# Patient Record
Sex: Female | Born: 1960 | Hispanic: Yes | Marital: Married | Smoking: Never smoker
Health system: Southern US, Community
[De-identification: ages and names within clinical notes are randomized; demographics above are authoritative.]

## PROBLEM LIST (undated history)

## (undated) DIAGNOSIS — I509 Heart failure, unspecified: Secondary | ICD-10-CM

## (undated) DIAGNOSIS — J45909 Unspecified asthma, uncomplicated: Secondary | ICD-10-CM

## (undated) DIAGNOSIS — E785 Hyperlipidemia, unspecified: Secondary | ICD-10-CM

## (undated) DIAGNOSIS — R7303 Prediabetes: Secondary | ICD-10-CM

## (undated) DIAGNOSIS — J189 Pneumonia, unspecified organism: Secondary | ICD-10-CM

## (undated) DIAGNOSIS — R0600 Dyspnea, unspecified: Secondary | ICD-10-CM

## (undated) DIAGNOSIS — G4730 Sleep apnea, unspecified: Secondary | ICD-10-CM

## (undated) HISTORY — PX: NO PAST SURGERIES: SHX2092

## (undated) HISTORY — PX: OTHER SURGICAL HISTORY: SHX169

---

## 2018-11-28 ENCOUNTER — Emergency Department (HOSPITAL_COMMUNITY): Payer: Self-pay

## 2018-11-28 ENCOUNTER — Other Ambulatory Visit: Payer: Self-pay

## 2018-11-28 ENCOUNTER — Encounter (HOSPITAL_COMMUNITY): Payer: Self-pay | Admitting: Pulmonary Disease

## 2018-11-28 ENCOUNTER — Inpatient Hospital Stay (HOSPITAL_COMMUNITY)
Admission: EM | Admit: 2018-11-28 | Discharge: 2018-12-14 | DRG: 207 | Disposition: A | Discharge status: Home Health | Payer: Self-pay | Attending: Internal Medicine | Admitting: Internal Medicine

## 2018-11-28 DIAGNOSIS — J1008 Influenza due to other identified influenza virus with other specified pneumonia: Principal | ICD-10-CM | POA: Diagnosis present

## 2018-11-28 DIAGNOSIS — R0602 Shortness of breath: Secondary | ICD-10-CM

## 2018-11-28 DIAGNOSIS — I5023 Acute on chronic systolic (congestive) heart failure: Secondary | ICD-10-CM | POA: Diagnosis present

## 2018-11-28 DIAGNOSIS — R140 Abdominal distension (gaseous): Secondary | ICD-10-CM

## 2018-11-28 DIAGNOSIS — J9601 Acute respiratory failure with hypoxia: Secondary | ICD-10-CM

## 2018-11-28 DIAGNOSIS — R069 Unspecified abnormalities of breathing: Secondary | ICD-10-CM

## 2018-11-28 DIAGNOSIS — J9690 Respiratory failure, unspecified, unspecified whether with hypoxia or hypercapnia: Secondary | ICD-10-CM

## 2018-11-28 DIAGNOSIS — I509 Heart failure, unspecified: Secondary | ICD-10-CM

## 2018-11-28 DIAGNOSIS — Z9289 Personal history of other medical treatment: Secondary | ICD-10-CM

## 2018-11-28 DIAGNOSIS — G9341 Metabolic encephalopathy: Secondary | ICD-10-CM | POA: Diagnosis present

## 2018-11-28 DIAGNOSIS — Z23 Encounter for immunization: Secondary | ICD-10-CM

## 2018-11-28 DIAGNOSIS — J9602 Acute respiratory failure with hypercapnia: Secondary | ICD-10-CM

## 2018-11-28 DIAGNOSIS — J9600 Acute respiratory failure, unspecified whether with hypoxia or hypercapnia: Secondary | ICD-10-CM

## 2018-11-28 DIAGNOSIS — E876 Hypokalemia: Secondary | ICD-10-CM | POA: Diagnosis not present

## 2018-11-28 DIAGNOSIS — N179 Acute kidney failure, unspecified: Secondary | ICD-10-CM | POA: Diagnosis present

## 2018-11-28 DIAGNOSIS — E870 Hyperosmolality and hypernatremia: Secondary | ICD-10-CM | POA: Diagnosis not present

## 2018-11-28 DIAGNOSIS — J80 Acute respiratory distress syndrome: Secondary | ICD-10-CM | POA: Diagnosis present

## 2018-11-28 DIAGNOSIS — R739 Hyperglycemia, unspecified: Secondary | ICD-10-CM | POA: Diagnosis present

## 2018-11-28 DIAGNOSIS — Z9911 Dependence on respirator [ventilator] status: Secondary | ICD-10-CM

## 2018-11-28 DIAGNOSIS — J129 Viral pneumonia, unspecified: Secondary | ICD-10-CM | POA: Diagnosis present

## 2018-11-28 DIAGNOSIS — E662 Morbid (severe) obesity with alveolar hypoventilation: Secondary | ICD-10-CM | POA: Diagnosis present

## 2018-11-28 DIAGNOSIS — Z01818 Encounter for other preprocedural examination: Secondary | ICD-10-CM

## 2018-11-28 DIAGNOSIS — J810 Acute pulmonary edema: Secondary | ICD-10-CM

## 2018-11-28 DIAGNOSIS — I272 Pulmonary hypertension, unspecified: Secondary | ICD-10-CM | POA: Diagnosis present

## 2018-11-28 DIAGNOSIS — K5909 Other constipation: Secondary | ICD-10-CM | POA: Diagnosis present

## 2018-11-28 DIAGNOSIS — J9811 Atelectasis: Secondary | ICD-10-CM | POA: Diagnosis not present

## 2018-11-28 LAB — RESPIRATORY PANEL BY PCR
Adenovirus: NOT DETECTED
Bordetella pertussis: NOT DETECTED
Chlamydophila pneumoniae: NOT DETECTED
Coronavirus 229E: NOT DETECTED
Coronavirus HKU1: NOT DETECTED
Coronavirus NL63: NOT DETECTED
Coronavirus OC43: NOT DETECTED
Influenza A: NOT DETECTED
Influenza B: DETECTED — AB
METAPNEUMOVIRUS-RVPPCR: NOT DETECTED
Mycoplasma pneumoniae: NOT DETECTED
PARAINFLUENZA VIRUS 1-RVPPCR: NOT DETECTED
PARAINFLUENZA VIRUS 2-RVPPCR: NOT DETECTED
Parainfluenza Virus 3: NOT DETECTED
Parainfluenza Virus 4: NOT DETECTED
Respiratory Syncytial Virus: NOT DETECTED
Rhinovirus / Enterovirus: NOT DETECTED

## 2018-11-28 LAB — HEPATIC FUNCTION PANEL
ALBUMIN: 3.2 g/dL — AB (ref 3.5–5.0)
ALT: 29 U/L (ref 0–44)
AST: 45 U/L — ABNORMAL HIGH (ref 15–41)
Alkaline Phosphatase: 100 U/L (ref 38–126)
Bilirubin, Direct: 0.1 mg/dL (ref 0.0–0.2)
Indirect Bilirubin: 0.1 mg/dL — ABNORMAL LOW (ref 0.3–0.9)
Total Bilirubin: 0.2 mg/dL — ABNORMAL LOW (ref 0.3–1.2)
Total Protein: 7.7 g/dL (ref 6.5–8.1)

## 2018-11-28 LAB — CBC
HCT: 48.5 % — ABNORMAL HIGH (ref 36.0–46.0)
HEMOGLOBIN: 14.5 g/dL (ref 12.0–15.0)
MCH: 29.4 pg (ref 26.0–34.0)
MCHC: 29.9 g/dL — ABNORMAL LOW (ref 30.0–36.0)
MCV: 98.2 fL (ref 80.0–100.0)
Platelets: 173 10*3/uL (ref 150–400)
RBC: 4.94 MIL/uL (ref 3.87–5.11)
RDW: 14.4 % (ref 11.5–15.5)
WBC: 8.2 10*3/uL (ref 4.0–10.5)
nRBC: 0.6 % — ABNORMAL HIGH (ref 0.0–0.2)

## 2018-11-28 LAB — CREATININE, SERUM
Creatinine, Ser: 0.66 mg/dL (ref 0.44–1.00)
GFR calc Af Amer: >60 mL/min (ref >60)
GFR calc non Af Amer: >60 mL/min (ref >60)

## 2018-11-28 LAB — BASIC METABOLIC PANEL
Anion gap: 8 (ref 5–15)
BUN: 9 mg/dL (ref 6–20)
CHLORIDE: 97 mmol/L — AB (ref 98–111)
CO2: 32 mmol/L (ref 22–32)
Calcium: 8.6 mg/dL — ABNORMAL LOW (ref 8.9–10.3)
Creatinine, Ser: 0.69 mg/dL (ref 0.44–1.00)
GFR calc Af Amer: >60 mL/min (ref >60)
GFR calc non Af Amer: >60 mL/min (ref >60)
Glucose, Bld: 132 mg/dL — ABNORMAL HIGH (ref 70–99)
Potassium: 4.5 mmol/L (ref 3.5–5.1)
Sodium: 137 mmol/L (ref 135–145)

## 2018-11-28 LAB — POCT I-STAT 7, (LYTES, BLD GAS, ICA,H+H)
Acid-Base Excess: 6 mmol/L — ABNORMAL HIGH (ref 0.0–2.0)
Bicarbonate: 37.4 mmol/L — ABNORMAL HIGH (ref 20.0–28.0)
Calcium, Ion: 1.18 mmol/L (ref 1.15–1.40)
HCT: 43 % (ref 36.0–46.0)
Hemoglobin: 14.6 g/dL (ref 12.0–15.0)
O2 Saturation: 90 %
PH ART: 7.213 — AB (ref 7.350–7.450)
Potassium: 4.4 mmol/L (ref 3.5–5.1)
SODIUM: 139 mmol/L (ref 135–145)
TCO2: 40 mmol/L — ABNORMAL HIGH (ref 22–32)
pCO2 arterial: 92.7 mmHg (ref 32.0–48.0)
pO2, Arterial: 76 mmHg — ABNORMAL LOW (ref 83.0–108.0)

## 2018-11-28 LAB — GLUCOSE, CAPILLARY
Glucose-Capillary: 116 mg/dL — ABNORMAL HIGH (ref 70–99)
Glucose-Capillary: 115 mg/dL — ABNORMAL HIGH (ref 70–99)

## 2018-11-28 LAB — PROCALCITONIN: Procalcitonin: <0.1 ng/mL

## 2018-11-28 LAB — MAGNESIUM: Magnesium: 1.8 mg/dL (ref 1.7–2.4)

## 2018-11-28 LAB — INFLUENZA PANEL BY PCR (TYPE A & B)
Influenza A By PCR: NEGATIVE
Influenza B By PCR: POSITIVE — AB

## 2018-11-28 LAB — PHOSPHORUS: Phosphorus: 3.4 mg/dL (ref 2.5–4.6)

## 2018-11-28 LAB — BRAIN NATRIURETIC PEPTIDE: B Natriuretic Peptide: 585.3 pg/mL — ABNORMAL HIGH (ref 0.0–100.0)

## 2018-11-28 LAB — MRSA PCR SCREENING: MRSA by PCR: NEGATIVE

## 2018-11-28 LAB — LACTIC ACID, PLASMA
Lactic Acid, Venous: 1.1 mmol/L (ref 0.5–1.9)
Lactic Acid, Venous: 1.2 mmol/L (ref 0.5–1.9)

## 2018-11-28 MED ORDER — FENTANYL CITRATE (PF) 100 MCG/2ML IJ SOLN
50.0000 ug | Freq: Once | INTRAMUSCULAR | Status: AC
  Administered 2018-11-28: 50 ug via INTRAVENOUS
  Filled 2018-11-28: qty 2

## 2018-11-28 MED ORDER — INSULIN ASPART 100 UNIT/ML ~~LOC~~ SOLN
0.0000 [IU] | SUBCUTANEOUS | Status: DC
  Administered 2018-11-29 – 2018-12-02 (×16): 1 [IU] via SUBCUTANEOUS
  Administered 2018-12-02: 2 [IU] via SUBCUTANEOUS
  Administered 2018-12-02: 1 [IU] via SUBCUTANEOUS
  Administered 2018-12-02: 2 [IU] via SUBCUTANEOUS
  Administered 2018-12-03 (×4): 1 [IU] via SUBCUTANEOUS
  Administered 2018-12-03: 2 [IU] via SUBCUTANEOUS
  Administered 2018-12-03 – 2018-12-04 (×3): 1 [IU] via SUBCUTANEOUS
  Administered 2018-12-04: 2 [IU] via SUBCUTANEOUS
  Administered 2018-12-04: 1 [IU] via SUBCUTANEOUS
  Administered 2018-12-04: 2 [IU] via SUBCUTANEOUS
  Administered 2018-12-04: 3 [IU] via SUBCUTANEOUS
  Administered 2018-12-04 – 2018-12-05 (×2): 1 [IU] via SUBCUTANEOUS
  Administered 2018-12-05 (×2): 2 [IU] via SUBCUTANEOUS
  Administered 2018-12-05: 1 [IU] via SUBCUTANEOUS
  Administered 2018-12-05 – 2018-12-07 (×10): 2 [IU] via SUBCUTANEOUS
  Administered 2018-12-07: 1 [IU] via SUBCUTANEOUS
  Administered 2018-12-07 – 2018-12-08 (×5): 2 [IU] via SUBCUTANEOUS
  Administered 2018-12-08: 3 [IU] via SUBCUTANEOUS
  Administered 2018-12-08 – 2018-12-09 (×3): 2 [IU] via SUBCUTANEOUS
  Administered 2018-12-09: 1 [IU] via SUBCUTANEOUS
  Administered 2018-12-09 (×4): 2 [IU] via SUBCUTANEOUS
  Administered 2018-12-10: 1 [IU] via SUBCUTANEOUS
  Administered 2018-12-10: 2 [IU] via SUBCUTANEOUS
  Administered 2018-12-10: 1 [IU] via SUBCUTANEOUS
  Administered 2018-12-10 (×2): 2 [IU] via SUBCUTANEOUS
  Administered 2018-12-10: 1 [IU] via SUBCUTANEOUS
  Administered 2018-12-11: 2 [IU] via SUBCUTANEOUS
  Administered 2018-12-11 – 2018-12-12 (×4): 1 [IU] via SUBCUTANEOUS
  Administered 2018-12-12: 2 [IU] via SUBCUTANEOUS
  Administered 2018-12-12: 1 [IU] via SUBCUTANEOUS

## 2018-11-28 MED ORDER — CHLORHEXIDINE GLUCONATE 0.12% ORAL RINSE (MEDLINE KIT)
15.0000 mL | Freq: Two times a day (BID) | OROMUCOSAL | Status: DC
  Administered 2018-11-28 – 2018-12-10 (×24): 15 mL via OROMUCOSAL

## 2018-11-28 MED ORDER — SODIUM CHLORIDE 0.9 % IV SOLN
1.0000 g | INTRAVENOUS | Status: AC
  Administered 2018-11-28 – 2018-12-04 (×7): 1 g via INTRAVENOUS
  Filled 2018-11-28 (×7): qty 10

## 2018-11-28 MED ORDER — HEPARIN SODIUM (PORCINE) 5000 UNIT/ML IJ SOLN
5000.0000 [IU] | Freq: Three times a day (TID) | INTRAMUSCULAR | Status: DC
  Administered 2018-11-28 – 2018-12-14 (×46): 5000 [IU] via SUBCUTANEOUS
  Filled 2018-11-28 (×46): qty 1

## 2018-11-28 MED ORDER — SODIUM CHLORIDE 0.9 % IV SOLN
500.0000 mg | INTRAVENOUS | Status: DC
  Administered 2018-11-28: 500 mg via INTRAVENOUS
  Filled 2018-11-28: qty 500

## 2018-11-28 MED ORDER — FENTANYL 2500MCG IN NS 250ML (10MCG/ML) PREMIX INFUSION
25.0000 ug/h | INTRAVENOUS | Status: DC
  Administered 2018-11-28: 50 ug/h via INTRAVENOUS
  Administered 2018-11-29 – 2018-12-01 (×3): 100 ug/h via INTRAVENOUS
  Administered 2018-12-04: 150 ug/h via INTRAVENOUS
  Administered 2018-12-04: 75 ug/h via INTRAVENOUS
  Administered 2018-12-05: 350 ug/h via INTRAVENOUS
  Filled 2018-11-28 (×8): qty 250

## 2018-11-28 MED ORDER — PANTOPRAZOLE SODIUM 40 MG IV SOLR
40.0000 mg | Freq: Every day | INTRAVENOUS | Status: DC
  Administered 2018-11-28: 40 mg via INTRAVENOUS
  Filled 2018-11-28: qty 40

## 2018-11-28 MED ORDER — SODIUM CHLORIDE 0.9% FLUSH: 3.0000 mL | Freq: Once | INTRAVENOUS | Status: DC

## 2018-11-28 MED ORDER — MIDAZOLAM HCL 2 MG/2ML IJ SOLN
2.0000 mg | INTRAMUSCULAR | Status: AC | PRN
  Administered 2018-11-28 (×3): 2 mg via INTRAVENOUS
  Filled 2018-11-28 (×3): qty 2

## 2018-11-28 MED ORDER — ETOMIDATE 2 MG/ML IV SOLN
30.0000 mg | Freq: Once | INTRAVENOUS | Status: AC
  Administered 2018-11-28: 30 mg via INTRAVENOUS

## 2018-11-28 MED ORDER — SODIUM CHLORIDE 0.9 % IV SOLN
INTRAVENOUS | Status: DC
  Administered 2018-11-28 – 2018-12-08 (×4): via INTRAVENOUS

## 2018-11-28 MED ORDER — FENTANYL BOLUS VIA INFUSION
50.0000 ug | INTRAVENOUS | Status: DC | PRN
  Administered 2018-11-28 – 2018-12-03 (×3): 50 ug via INTRAVENOUS
  Administered 2018-12-04: 25 ug via INTRAVENOUS
  Administered 2018-12-05: 50 ug via INTRAVENOUS
  Filled 2018-11-28: qty 50

## 2018-11-28 MED ORDER — INFLUENZA VAC SPLIT QUAD 0.5 ML IM SUSY
0.5000 mL | PREFILLED_SYRINGE | INTRAMUSCULAR | Status: AC | PRN
  Administered 2018-12-14: 0.5 mL via INTRAMUSCULAR
  Filled 2018-11-28: qty 0.5

## 2018-11-28 MED ORDER — IPRATROPIUM-ALBUTEROL 0.5-2.5 (3) MG/3ML IN SOLN
3.0000 mL | Freq: Four times a day (QID) | RESPIRATORY_TRACT | Status: DC
  Administered 2018-11-28 – 2018-12-12 (×55): 3 mL via RESPIRATORY_TRACT
  Filled 2018-11-28 (×9): qty 3
  Filled 2018-11-28: qty 6
  Filled 2018-11-28 (×23): qty 3
  Filled 2018-11-28: qty 6
  Filled 2018-11-28 (×21): qty 3

## 2018-11-28 MED ORDER — FUROSEMIDE 10 MG/ML IJ SOLN
40.0000 mg | Freq: Once | INTRAMUSCULAR | Status: AC
  Administered 2018-11-28: 40 mg via INTRAVENOUS
  Filled 2018-11-28: qty 4

## 2018-11-28 MED ORDER — SUCCINYLCHOLINE CHLORIDE 20 MG/ML IJ SOLN
200.0000 mg | Freq: Once | INTRAMUSCULAR | Status: AC
  Administered 2018-11-28: 200 mg via INTRAVENOUS
  Filled 2018-11-28: qty 10

## 2018-11-28 MED ORDER — MIDAZOLAM HCL 2 MG/2ML IJ SOLN
2.0000 mg | INTRAMUSCULAR | Status: DC | PRN
  Administered 2018-12-05 – 2018-12-10 (×8): 2 mg via INTRAVENOUS
  Filled 2018-11-28 (×8): qty 2

## 2018-11-28 MED ORDER — ORAL CARE MOUTH RINSE
15.0000 mL | OROMUCOSAL | Status: DC
  Administered 2018-11-28 – 2018-12-10 (×110): 15 mL via OROMUCOSAL

## 2018-11-28 NOTE — Progress Notes (Signed)
   11/28/18 1600  Clinical Encounter Type  Visited With Patient and family together  Visit Type ED  Referral From Nurse  Patient is non-responsive and intubated. Spoke with patient's daughter along with security guard who spoke spanish. Daughter called other family members to come to hospital because her mother was going to be in one of the ICU units.

## 2018-11-28 NOTE — Plan of Care (Signed)

## 2018-11-28 NOTE — ED Provider Notes (Addendum)
[facility] EMERGENCY DEPARTMENT Provider Note   CSN: [id number] Arrival date & time: 11/28/18  1253    History   Chief Complaint Chief Complaint  Patient presents with  . Shortness of Breath    HPI Veronica Castaneda is a 57 y.o. female.     HPI   58 yo F with no known PMHx here with SOB.  History obtained with Spanish interpreter.  Per patient's report, she has had progressively worsening shortness of breath, cough, and weakness throughout the week.  Her symptoms started as a cough, worse with lying flat and with exertion initially.  Over the last several days, this is progressed to worsening shortness of breath.  She has had severe dyspnea with any amount of exertion.  She has been tired due to her work of breathing.  She denies any known fevers.  She has not had any sputum production.  She has noticed increasing lower extremity edema as well as distention of his abdomen as well.  Denies any chest pain.  Denies known history of coronary disease and has not had a stent placed. History somewhat limited secondary to increased work of breathing and fatigue.  History reviewed. No pertinent past medical history.  Patient Active Problem List   Diagnosis Date Noted  . Acute respiratory failure (HCC) 11/28/2018    History reviewed. No pertinent surgical history.   OB History   No obstetric history on file.      Home Medications    Prior to Admission medications   Medication Sig Start Date End Date Taking? Authorizing Provider  acetaminophen (TYLENOL) 325 MG tablet Take 650 mg by mouth every 6 (six) hours as needed for mild pain or fever.   Yes [provider]  Phenyleph-Doxylamine-DM-APAP (NYQUIL SEVERE COLD/FLU) 5-6.25-10-325 MG/15ML LIQD Take 1 Dose by mouth as needed (cough and cold).   Yes [provider]  Pseudoeph-Doxylamine-DM-APAP (NYQUIL PO) Take 2 tablets by mouth as needed (cough and cold).   Yes [provider]     Family History History reviewed. No pertinent family history.  Social History Social History   Tobacco Use  . Smoking status: Not on file  Substance Use Topics  . Alcohol use: Not on file  . Drug use: Not on file     Allergies   Patient has no allergy information on record.   Review of Systems Review of Systems  Constitutional: Positive for fatigue. Negative for chills and fever.  HENT: Negative for congestion and rhinorrhea.   Eyes: Negative for visual disturbance.  Respiratory: Positive for shortness of breath. Negative for cough and wheezing.   Cardiovascular: Positive for leg swelling. Negative for chest pain.  Gastrointestinal: Negative for abdominal pain, diarrhea, nausea and vomiting.  Genitourinary: Negative for dysuria and flank pain.  Musculoskeletal: Negative for neck pain and neck stiffness.  Skin: Negative for rash and wound.  Allergic/Immunologic: Negative for immunocompromised state.  Neurological: Positive for weakness. Negative for syncope and headaches.  All other systems reviewed and are negative.    Physical Exam Updated Vital Signs BP 113/80   Pulse 82   Resp 14   Ht 4\' 10"  (1.473 m)   Wt 122.5 kg   SpO2 97%   BMI 56.43 kg/m   Physical Exam Vitals signs and nursing note reviewed.  Constitutional:      General: She is in acute distress.     Appearance: She is well-developed. She is ill-appearing.  HENT:     Head: Normocephalic and  atraumatic.  Eyes:     Conjunctiva/sclera: Conjunctivae normal.  Neck:     Musculoskeletal: Neck supple.  Cardiovascular:     Rate and Rhythm: Normal rate and regular rhythm.     Heart sounds: Normal heart sounds. No murmur. No friction rub.  Pulmonary:     Effort: Pulmonary effort is normal. Tachypnea present. No respiratory distress.     Breath sounds: Examination of the right-middle field reveals rales. Examination of the left-middle field reveals rales. Examination of the right-lower field reveals  rales. Examination of the left-lower field reveals rales. Rales present.  Abdominal:     General: There is no distension.     Palpations: Abdomen is soft.     Tenderness: There is no abdominal tenderness.  Musculoskeletal:     Right lower leg: Edema present.     Left lower leg: Edema present.  Skin:    General: Skin is warm.     Capillary Refill: Capillary refill takes less than 2 seconds.  Neurological:     Mental Status: She is alert and oriented to person, place, and time.     Motor: No abnormal muscle tone.      ED Treatments / Results  Labs (all labs ordered are listed, but only abnormal results are displayed) Labs Reviewed  BASIC METABOLIC PANEL - Abnormal; Notable for the following components:      Result Value   Chloride 97 (*)    Glucose, Bld 132 (*)    Calcium 8.6 (*)    All other components within normal limits  CBC - Abnormal; Notable for the following components:   HCT 48.5 (*)    MCHC 29.9 (*)    nRBC 0.6 (*)    All other components within normal limits  BRAIN NATRIURETIC PEPTIDE - Abnormal; Notable for the following components:   B Natriuretic Peptide 585.3 (*)    All other components within normal limits  HEPATIC FUNCTION PANEL - Abnormal; Notable for the following components:   Albumin 3.2 (*)    AST 45 (*)    Total Bilirubin 0.2 (*)    Indirect Bilirubin 0.1 (*)    All other components within normal limits  POCT I-STAT 7, (LYTES, BLD GAS, ICA,H+H) - Abnormal; Notable for the following components:   pH, Arterial 7.213 (*)    pCO2 arterial 92.7 (*)    pO2, Arterial 76.0 (*)    Bicarbonate 37.4 (*)    TCO2 40 (*)    Acid-Base Excess 6.0 (*)    All other components within normal limits  RESPIRATORY PANEL BY PCR  CULTURE, RESPIRATORY  URINE CULTURE  INFLUENZA PANEL BY PCR (TYPE A & B)  RAPID URINE DRUG SCREEN, HOSP PERFORMED  PROCALCITONIN  HEMOGLOBIN A1C  CBC  CREATININE, SERUM  MAGNESIUM  PHOSPHORUS  LACTIC ACID, PLASMA  LACTIC ACID, PLASMA   LEGIONELLA PNEUMOPHILA SEROGP 1 UR AG  STREP PNEUMONIAE URINARY ANTIGEN  BLOOD GAS, ARTERIAL  HIV ANTIBODY (ROUTINE TESTING W REFLEX)  I-STAT TROPONIN, ED  I-STAT BETA HCG BLOOD, ED ([facility], [facility], [facility] ONLY)    EKG EKG Interpretation  Date/Time:  Friday November 28 2018 13:16:18 EST Ventricular Rate:  100 PR Interval:  134 QRS Duration: 80 QT Interval:  328 QTC Calculation: 423 R Axis:   98 Text Interpretation:  Sinus rhythm with frequent Premature ventricular complexes Biatrial enlargement Rightward axis Low voltage QRS Abnormal ECG No old tracing to compare Confirmed by Shaune Pollack [phone]) on 11/28/2018 4:20:43 PM   Radiology Dg Chest  Portable 1 View  Result Date: 11/28/2018 CLINICAL DATA:  Shortness of breath.  Intubation. EXAM: PORTABLE CHEST 1 VIEW COMPARISON:  One-view chest x-ray 11/28/2018 at 2:26 p.m. FINDINGS: Patient has been intubated. The endotracheal tube terminates near the carina and could be pulled back 2-3 cm for more optimal positioning. Heart is enlarged. Interstitial edema is again noted without significant change. Bibasilar airspace disease likely reflects atelectasis. IMPRESSION: 1. Endotracheal tube terminates just above the carina and could be pulled back 2-3 cm for more optimal positioning. 2. Stable findings of congestive heart failure. Electronically Signed   By: Marin Roberts M.D.   On: 11/28/2018 16:19   Dg Chest Port 1 View  Result Date: 11/28/2018 CLINICAL DATA:  Shortness of breath for the past 3 days. Nonsmoker. EXAM: PORTABLE CHEST 1 VIEW COMPARISON:  None. FINDINGS: Enlarged cardiac silhouette. Prominent pulmonary vasculature and interstitial markings. No visible Kerley lines or pleural fluid. Mild peribronchial thickening. Thoracic spine degenerative changes. IMPRESSION: Cardiomegaly and mild changes of congestive heart failure. Electronically Signed   By: Beckie Salts M.D.   On: 11/28/2018 15:06    Procedures .Critical Care Performed by: Shaune Pollack, MD Authorized by: Shaune Pollack, MD   Critical care provider statement:    Critical care time (minutes):  75   Critical care time was exclusive of:  Separately billable procedures and treating other patients and teaching time   Critical care was necessary to treat or prevent imminent or life-threatening deterioration of the following conditions:  Circulatory failure, cardiac failure and respiratory failure   Critical care was time spent personally by me on the following activities:  Development of treatment plan with patient or surrogate, discussions with consultants, evaluation of patient's response to treatment, examination of patient, obtaining history from patient or surrogate, ordering and performing treatments and interventions, ordering and review of laboratory studies, ordering and review of radiographic studies, pulse oximetry, re-evaluation of patient's condition and review of old charts   I assumed direction of critical care for this patient from another provider in my specialty: no   Procedure Name: Intubation Date/Time: 11/28/2018 4:53 PM Performed by: Shaune Pollack, MD Pre-anesthesia Checklist: Patient identified, Patient being monitored, Emergency Drugs available, Timeout performed and Suction available Oxygen Delivery Method: Non-rebreather mask Preoxygenation: Pre-oxygenation with 100% oxygen Induction Type: Rapid sequence Ventilation: Mask ventilation without difficulty Laryngoscope Size: Glidescope and 3 Grade View: Grade I Tube size: 7.5 mm Number of attempts: 1 Airway Equipment and Method: Rigid stylet and Patient positioned with wedge pillow Placement Confirmation: ETT inserted through vocal cords under direct vision,  CO2 detector and Breath sounds checked- equal and bilateral Secured at: 23 cm Tube secured with: ETT holder Dental Injury: Teeth and Oropharynx as per pre-operative assessment  Difficulty Due To: Difficulty was anticipated, Difficult Airway-  due to large tongue and Difficult Airway- due to reduced neck mobility      (including critical care time)  Medications Ordered in ED Medications  fentaNYL in NS (33mcg/ml) infusion-PREMIX (50 mcg/hr Intravenous New Bag/Given 11/28/18 1535)  fentaNYL (SUBLIMAZE) bolus via infusion 50 mcg (50 mcg Intravenous Bolus from Bag 11/28/18 1537)  midazolam (VERSED) injection 2 mg (2 mg Intravenous Given 11/28/18 1635)  midazolam (VERSED) injection 2 mg (has no administration in time range)  insulin aspart (novoLOG) injection 0-9 Units (has no administration in time range)  heparin injection 5,000 Units (has no administration in time range)  pantoprazole (PROTONIX) injection 40 mg (has no administration in time range)  0.9 %  sodium chloride infusion (has no administration in time range)  azithromycin (ZITHROMAX) 500 mg in sodium chloride 0.9 % 250 mL IVPB (has no administration in time range)  cefTRIAXone (ROCEPHIN) 1 g in sodium chloride 0.9 % 100 mL IVPB (has no administration in time range)  furosemide (LASIX) injection 40 mg (40 mg Intravenous Given 11/28/18 1616)  succinylcholine (ANECTINE) injection 200 mg (200 mg Intravenous Given 11/28/18 1526)  etomidate (AMIDATE) injection 30 mg (30 mg Intravenous Given 11/28/18 1526)  fentaNYL (SUBLIMAZE) injection 50 mcg (50 mcg Intravenous Given 11/28/18 1543)     Initial Impression / Assessment and Plan / ED Course  I have reviewed the triage vital signs and the nursing notes.  Pertinent labs & imaging results that were available during my care of the patient were reviewed by me and considered in my medical decision making (see chart for details).        58 year old morbidly obese female here with shortness of breath.  I suspect patient has multifactorial combined hypoxic and hypercapnic respiratory failure secondary to CHF, likely due to pulmonary hypertension, as well as obesity hypoventilation syndrome.  Patient profoundly hypoxic to the  60s on 6 L on arrival here.  She was briefly placed on a nonrebreather with improvement then placed on BiPAP.  Chest x-ray is consistent with CHF.  BNP is elevated.  Lab work does not suggest acute infection and chest x-ray has no focal consolidation.  She has no unilateral leg swelling to suggest PE.  On BiPAP, patient remained drowsy and demonstrated persistent hypoventilation with low tidal volumes and increasing confusion.  Decision subsequently made to intubate, which patient tolerated well.  Endotracheal tube at carina on repeat chest x-ray, and this was withdrawn 2 cm.  Lasix given.  Initial blood gas shows acute on chronic hypoxic and hypercapnic respiratory failure.  Increased respiratory rate from 14-18 and will also increase her PEEP given her significant obesity hypoventilation and chest wall resistance, to facilitate better oxygenation and ventilation.  Admit to ICU.  Final Clinical Impressions(s) / ED Diagnoses   Final diagnoses:  Acute respiratory failure with hypoxia and hypercapnia (HCC)  Acute on chronic congestive heart failure, unspecified heart failure type [facility])    ED Discharge Orders    None       Shaune Pollack, MD 11/28/18 1656    Shaune Pollack, MD 11/28/18 [phone]

## 2018-11-28 NOTE — ED Notes (Signed)
ED Provider at bedside. 

## 2018-11-28 NOTE — ED Notes (Signed)
ED TO INPATIENT HANDOFF REPORT  ED Nurse Name and Phone #: Jinny Blossom [phone]  S Name/Age/Gender Veronica Castaneda 58 y.o. female Room/Bed: 026C/026C  Code Status   Code Status: Full Code  Home/SNF/Other Home Patient oriented to: self, place, time and situation Is this baseline? Yes   Triage Complete: Triage complete  Chief Complaint SOB; Bilateral Edema; PNA Decreased  Triage Note Patient sent by PCP - diagnosed with pneumonia, had decreased oxygen saturation - 70% RA in triage, increased to 94% with 4L O2 nasal cannula. Patient's family states patient has had cough, fevers/chills, shortness of breath x 3 days. Respirations labored, patient reports some chest tightness d/t dyspnea.   Allergies Not on File  Level of Care/Admitting Diagnosis ED Disposition    ED Disposition Condition [place] [facility]: [place] [[id number]]  Level of Care: ICU [6]  Diagnosis: Acute respiratory failure ([place]) [518.81.ICD-9-CM]  Admitting Physician: Loanne Drilling [facility] [[id number]]  Attending Physician: Margaretha Seeds [[id number]]  Estimated length of stay: past midnight tomorrow  Certification:: I certify this patient will need inpatient services for at least 2 midnights  PT Class (Do Not Modify): Inpatient [101]  PT Acc Code (Do Not Modify): Private [1]       B Medical/Surgery History History reviewed. No pertinent past medical history. History reviewed. No pertinent surgical history.   A IV Location/Drains/Wounds Patient Lines/Drains/Airways Status   Active Line/Drains/Airways    Name:   Placement date:   Placement time:   Site:   Days:   Peripheral IV 11/28/18 Right Antecubital   11/28/18    1525    Antecubital   less than 1   Peripheral IV 11/28/18 Left Hand   11/28/18    1530    Hand   less than 1   NG/OG Tube Orogastric 16 Fr. Left mouth Xray;Aucultation Documented cm marking at nare/ corner of mouth   11/28/18    1615    Left mouth   less  than 1   Airway 7.5 mm   11/28/18    1606     less than 1          Intake/Output Last 24 hours No intake or output data in the 24 hours ending 11/28/18 1714  Labs/Imaging Results for orders placed or performed during the hospital encounter of 11/28/18 (from the past 48 hour(s))  Basic metabolic panel     Status: Abnormal   Collection Time: 11/28/18  1:26 PM  Result Value Ref Range   Sodium 137 135 - 145 mmol/L   Potassium 4.5 3.5 - 5.1 mmol/L   Chloride 97 (L) 98 - 111 mmol/L   CO2 32 22 - 32 mmol/L   Glucose, Bld 132 (H) 70 - 99 mg/dL   BUN 9 6 - 20 mg/dL   Creatinine, Ser 0.69 0.44 - 1.00 mg/dL   Calcium 8.6 (L) 8.9 - 10.3 mg/dL   GFR calc non Af Amer >60 >60 mL/min   GFR calc Af Amer >60 >60 mL/min   Anion gap 8 5 - 15    Comment: Performed at [facility], [place] [address], [place] [address]  CBC     Status: Abnormal   Collection Time: 11/28/18  1:26 PM  Result Value Ref Range   WBC 8.2 4.0 - 10.5 K/uL   RBC 4.94 3.87 - 5.11 MIL/uL   Hemoglobin 14.5 12.0 - 15.0 g/dL   HCT 48.5 (H) 36.0 - 46.0 %  MCV 98.2 80.0 - 100.0 fL   MCH 29.4 26.0 - 34.0 pg   MCHC 29.9 (L) 30.0 - 36.0 g/dL   RDW 14.4 11.5 - 15.5 %   Platelets 173 150 - 400 K/uL   nRBC 0.6 (H) 0.0 - 0.2 %    Comment: Performed at [facility] [address] [place], [place] [address]  Brain natriuretic peptide     Status: Abnormal   Collection Time: 11/28/18  1:26 PM  Result Value Ref Range   B Natriuretic Peptide 585.3 (H) 0.0 - 100.0 pg/mL    Comment: Performed at [facility] [address] [place], [place] [address]  Hepatic function panel     Status: Abnormal   Collection Time: 11/28/18  1:26 PM  Result Value Ref Range   Total Protein 7.7 6.5 - 8.1 g/dL   Albumin 3.2 (L) 3.5 - 5.0 g/dL   AST 45 (H) 15 - 41 U/L   ALT 29 0 - 44 U/L   Alkaline Phosphatase 100 38 - 126 U/L   Total Bilirubin 0.2 (L) 0.3 - 1.2 mg/dL   Bilirubin, Direct 0.1 0.0 - 0.2 mg/dL   Indirect  Bilirubin 0.1 (L) 0.3 - 0.9 mg/dL    Comment: Performed at [facility] [address] [place], [place] [address]  I-STAT 7, (LYTES, BLD GAS, ICA, H+H)     Status: Abnormal   Collection Time: 11/28/18  4:47 PM  Result Value Ref Range   pH, Arterial 7.213 (L) 7.350 - 7.450   pCO2 arterial 92.7 (HH) 32.0 - 48.0 mmHg   pO2, Arterial 76.0 (L) 83.0 - 108.0 mmHg   Bicarbonate 37.4 (H) 20.0 - 28.0 mmol/L   TCO2 40 (H) 22 - 32 mmol/L   O2 Saturation 90.0 %   Acid-Base Excess 6.0 (H) 0.0 - 2.0 mmol/L   Sodium 139 135 - 145 mmol/L   Potassium 4.4 3.5 - 5.1 mmol/L   Calcium, Ion 1.18 1.15 - 1.40 mmol/L   HCT 43.0 36.0 - 46.0 %   Hemoglobin 14.6 12.0 - 15.0 g/dL   Patient temperature HIDE    Collection site RADIAL, ALLEN'S TEST ACCEPTABLE    Sample type ARTERIAL    Comment NOTIFIED PHYSICIAN    Dg Chest Portable 1 View  Result Date: 11/28/2018 CLINICAL DATA:  Shortness of breath.  Intubation. EXAM: PORTABLE CHEST 1 VIEW COMPARISON:  One-view chest x-ray 11/28/2018 at 2:26 p.m. FINDINGS: Patient has been intubated. The endotracheal tube terminates near the carina and could be pulled back 2-3 cm for more optimal positioning. Heart is enlarged. Interstitial edema is again noted without significant change. Bibasilar airspace disease likely reflects atelectasis. IMPRESSION: 1. Endotracheal tube terminates just above the carina and could be pulled back 2-3 cm for more optimal positioning. 2. Stable findings of congestive heart failure. Electronically Signed   By: San Morelle M.D.   On: 11/28/2018 16:19   Dg Chest Port 1 View  Result Date: 11/28/2018 CLINICAL DATA:  Shortness of breath for the past 3 days. Nonsmoker. EXAM: PORTABLE CHEST 1 VIEW COMPARISON:  None. FINDINGS: Enlarged cardiac silhouette. Prominent pulmonary vasculature and interstitial markings. No visible Kerley lines or pleural fluid. Mild peribronchial thickening. Thoracic spine degenerative changes. IMPRESSION: Cardiomegaly  and mild changes of congestive heart failure. Electronically Signed   By: Claudie Revering M.D.   On: 11/28/2018 15:06    Pending Labs [employer] (From admission, onward)    Start     Ordered  11/29/18 0500  CBC  Tomorrow morning,   R     11/28/18 1637   11/29/18 5456  Basic metabolic panel  Tomorrow morning,   R     11/28/18 1637   11/29/18 0500  Blood gas, arterial  Tomorrow morning,   R     11/28/18 1637   11/29/18 0500  Magnesium  Tomorrow morning,   R     11/28/18 1637   11/29/18 0500  Procalcitonin  Daily,   R     11/28/18 1637   11/28/18 1700  HIV Antibody (routine testing w rflx)  Once,   STAT     11/28/18 1700   11/28/18 1636  Magnesium  Once,   R     11/28/18 1637   11/28/18 1636  Phosphorus  Once,   R     11/28/18 1637   11/28/18 1636  Lactic acid, plasma  STAT Now then every 3 hours,   R     11/28/18 1637   11/28/18 1636  Urine culture  Once,   R     11/28/18 1637   11/28/18 1636  Legionella Pneumophila Serogp 1 Ur Ag  Once,   R     11/28/18 1637   11/28/18 1636  Strep pneumoniae urinary antigen  (not at [facility])  Once,   R     11/28/18 1637   11/28/18 1636  Blood gas, arterial  Once,   R     11/28/18 1637   11/28/18 1634  Procalcitonin - Baseline  ONCE - STAT,   STAT     11/28/18 1637   11/28/18 1634  Hemoglobin A1c  Once,   R    Comments:  To assess prior glycemic control    11/28/18 1637   11/28/18 1634  Creatinine, serum  (heparin)  Once,   R    Comments:  Baseline for heparin therapy IF NOT ALREADY DRAWN.    11/28/18 1637   11/28/18 1629  Urine rapid drug screen (hosp performed)  ONCE - STAT,   R     11/28/18 1628   11/28/18 1629  Culture, respiratory (non-expectorated)  Once,   R     11/28/18 1628   11/28/18 1600  Respiratory Panel by PCR  (Respiratory virus panel with precautions)  Once,   R     11/28/18 1559   11/28/18 1535  Influenza panel by PCR (type A & B)  (Influenza PCR Panel)  Once,   R     11/28/18 1534          Vitals/Pain Today's  Vitals   11/28/18 1600 11/28/18 1601 11/28/18 1645 11/28/18 1656  BP: 113/80  115/86   Pulse:  82    Resp:  14 14   SpO2:  97%  99%  Weight:      Height:        Isolation Precautions Droplet precaution  Medications Medications  fentaNYL 2534mg in NS 2538m(1031mml) infusion-PREMIX (75 mcg/hr Intravenous Rate/Dose Change 11/28/18 1707)  fentaNYL (SUBLIMAZE) bolus via infusion 50 mcg (50 mcg Intravenous Bolus from Bag 11/28/18 1537)  midazolam (VERSED) injection 2 mg (has no administration in time range)  insulin aspart (novoLOG) injection 0-9 Units (has no administration in time range)  heparin injection 5,000 Units (has no administration in time range)  pantoprazole (PROTONIX) injection 40 mg (has no administration in time range)  0.9 %  sodium chloride infusion (has no administration in time range)  azithromycin (ZITHROMAX) 500 mg in sodium chloride  0.9 % 250 mL IVPB (has no administration in time range)  cefTRIAXone (ROCEPHIN) 1 g in sodium chloride 0.9 % 100 mL IVPB (has no administration in time range)  furosemide (LASIX) injection 40 mg (40 mg Intravenous Given 11/28/18 1616)  succinylcholine (ANECTINE) injection 200 mg (200 mg Intravenous Given 11/28/18 1526)  etomidate (AMIDATE) injection 30 mg (30 mg Intravenous Given 11/28/18 1526)  fentaNYL (SUBLIMAZE) injection 50 mcg (50 mcg Intravenous Given 11/28/18 1543)  midazolam (VERSED) injection 2 mg (2 mg Intravenous Given 11/28/18 1706)    Mobility walks Low fall risk   Focused Assessments Pulmonary Assessment Handoff:  Lung sounds: Bilateral Breath Sounds: Clear O2 Device: Ventilator O2 Flow Rate (L/min): 10 L/min      R Recommendations: See Admitting Provider Note  Report given to:   Additional Notes: Pt is spanish speaking, needs interpreter. Daughter beside knows some [place], has met with chaplain

## 2018-11-28 NOTE — ED Notes (Signed)
Pt transitioned to 4L of oxygen from non-rebreather. Tolerating well at this time, 97%.

## 2018-11-28 NOTE — Progress Notes (Signed)
ABG results given to Shaune Pollack, MD Orders received to increase RR 18 and PEEP 8.

## 2018-11-28 NOTE — ED Notes (Signed)
Pt placed on Bipap

## 2018-11-28 NOTE — ED Notes (Signed)
Pt 62% on 4L via nasal cannula upon arrival to exam room. Respiratory therapist called, recommended placing pt on nonrebreather until they could be present. Nonrebreather placed on pt, sats increased to 99% at this time.

## 2018-11-28 NOTE — H&P (Signed)
NAME:  Veronica Castaneda, MRN:  [id number], DOB:  1961/08/25, LOS: 0 ADMISSION DATE:  11/28/2018, CONSULTATION DATE:  3/6 REFERRING MD:  isaacs, CHIEF COMPLAINT:  Acute respiratory failure    Brief History   58 year old female admitted 3/6 w/ working dx of CAP +/- pulmonary edema vs ALI w/ resultant hypoxic respiratory failure requiring intubation   History of present illness   58 year old female who initially presented to PCP on 3/6  3d h/o productive cough, fever, chills and worsening shortness of breath. Room air sats at PCP office were 70% so she was sent to ER for evaluation. In triage placed on supplemental oxygen initially required 4 liters for sats to reach 94% but respiratory effort became more labored and sats dropped to 60s on 4 liters. She was placed on BIPAP but still demonstrated worsening WOB so was intubated.  ER work up:BNP 585, wbc 8.2 PCXR R>L interstitial airspace disease; somewhat improved w/ positive pressure   Past Medical History  No sig medical hx on chart Significant Hospital Events   3/6 admitted Consults:  na  Procedures:  ETT 3/6>>>  Significant Diagnostic Tests:  UDS 3/6>>>  Micro Data:  Influenza 3/6>>> Sputum 3/6>>> BCXx2 3/6>>> UC 3/6>>> RVP 3/6>>>  Antimicrobials:   azithro 3/6>>> Rocephin 3/6>>>  Interim history/subjective:    Objective   Blood pressure 113/80, pulse 82, resp. rate 14, height 4\' 10"  (1.473 m), weight 122.5 kg, SpO2 97 %.    Vent Mode: PRVC FiO2 (%):  [50 %-60 %] 60 % Set Rate:  [14 bmp] 14 bmp Vt Set:  [350 mL] 350 mL PEEP:  [5 cmH20] 5 cmH20 Plateau Pressure:  [22 cmH20] 22 cmH20  No intake or output data in the 24 hours ending 11/28/18 1646 Filed Weights   11/28/18 1341  Weight: 122.5 kg   Physical Exam: General: Morbidly obese female, laying in bed, on mechanical ventilation HENT: [employer], AT, ETT in place Eyes: EOMI, no scleral icterus Respiratory: Difficult to auscultate. No wheezing  bilaterally Cardiovascular: Difficult to auscultate. RRR, -M/R/G. Unable to appreciate JVD GI: BS+, soft, nontender Extremities: 1+ pitting edema in lower extremities, warm to touch Neuro: Sedated Skin: Intact, no rashes or bruising Psych: Unable to assess GU: No foley in place  CXR 11/28/18 reviewed independently: Interstitial edema, bibasilar atelectasis  Resolved Hospital Problem list     Assessment & Plan:  Acute hypoxic respiratory failure in setting of bilateral R>L pulmonary infiltrates  -ddx CAP w/ evolving ALI vs CAP w/ element of pulmonary edema (BNP is elevated) pcxr w/ bilateral airspace disease. ETT good position Plan Admit to ICU Sputum culture, RV panel Empiric rocephin and azith Full vent support PAD protocol RASS goal -2 VAP bundle ECHO UDS  SIRS/sepsis 2/2 pna Plan Ensure 48ml/kg fluid challenge Admit to ICU Serial lactic acids  Mild hyperglycemia Plan ssi   Best practice:  Diet: NPO Pain/Anxiety/Delirium protocol (if indicated): 3/6 VAP protocol (if indicated): 3/6 DVT prophylaxis: [place] heparin  GI prophylaxis: ppi Glucose control: ssi if indicated Mobility: BR Code Status: full code  Family Communication: pending  Disposition:   Labs   CBC: Recent Labs  Lab 11/28/18 1326  WBC 8.2  HGB 14.5  HCT 48.5*  MCV 98.2  PLT 173    Basic Metabolic Panel: Recent Labs  Lab 11/28/18 1326  NA 137  K 4.5  CL 97*  CO2 32  GLUCOSE 132*  BUN 9  CREATININE 0.69  CALCIUM 8.6*   GFR: Estimated  Creatinine Clearance: 92.2 mL/min (by C-G formula based on SCr of 0.69 mg/dL). Recent Labs  Lab 11/28/18 1326  WBC 8.2    Liver Function Tests: Recent Labs  Lab 11/28/18 1326  AST 45*  ALT 29  ALKPHOS 100  BILITOT 0.2*  PROT 7.7  ALBUMIN 3.2*   No results for input(s): LIPASE, AMYLASE in the last 168 hours. No results for input(s): AMMONIA in the last 168 hours.  ABG No results found for: PHART, PCO2ART, PO2ART, HCO3, TCO2,  ACIDBASEDEF, O2SAT   Coagulation Profile: No results for input(s): INR, PROTIME in the last 168 hours.  Cardiac Enzymes: No results for input(s): CKTOTAL, CKMB, CKMBINDEX, TROPONINI in the last 168 hours.  HbA1C: No results found for: HGBA1C  CBG: No results for input(s): GLUCAP in the last 168 hours.  Review of Systems:   Unable to obtain due to mental status  Past Medical History  She,  has no past medical history on file.  Unable to obtain due to mental status Surgical History   History reviewed. No pertinent surgical history.  Unable to obtain due to mental status Social History     Unable to obtain due to mental status Family History   Her family history is not on file.  Unable to obtain due to mental status Allergies Not on File   Home Medications  Prior to Admission medications   Not on File     Critical care time: 40 min  The patient is critically ill with multiple organ systems failure and requires high complexity decision making for assessment and support, frequent evaluation and titration of therapies, application of advanced monitoring technologies and extensive interpretation of multiple databases.   Critical Care Time devoted to patient care services described in this note is 41 Minutes. This time reflects time of care of this signee Dr. Mechele Collin. This critical care time does not reflect procedure time, or teaching time or supervisory time of PA/NP/Med student/Med Resident etc but could involve care discussion time.  Mechele Collin, M.D. [facility] Pulmonary/Critical Care Medicine Pager: [phone] After hours pager: [phone]

## 2018-11-28 NOTE — ED Notes (Signed)
Time out performed for intubation.

## 2018-11-28 NOTE — ED Triage Notes (Signed)
Patient sent by PCP - diagnosed with pneumonia, had decreased oxygen saturation - 70% RA in triage, increased to 94% with 4L O2 nasal cannula. Patient's family states patient has had cough, fevers/chills, shortness of breath x 3 days. Respirations labored, patient reports some chest tightness d/t dyspnea.

## 2018-11-29 ENCOUNTER — Inpatient Hospital Stay (HOSPITAL_COMMUNITY): Payer: Self-pay

## 2018-11-29 DIAGNOSIS — R0689 Other abnormalities of breathing: Secondary | ICD-10-CM

## 2018-11-29 DIAGNOSIS — J101 Influenza due to other identified influenza virus with other respiratory manifestations: Secondary | ICD-10-CM

## 2018-11-29 DIAGNOSIS — J9621 Acute and chronic respiratory failure with hypoxia: Secondary | ICD-10-CM

## 2018-11-29 DIAGNOSIS — J9622 Acute and chronic respiratory failure with hypercapnia: Secondary | ICD-10-CM

## 2018-11-29 LAB — POCT I-STAT 7, (LYTES, BLD GAS, ICA,H+H)
Acid-Base Excess: 13 mmol/L — ABNORMAL HIGH (ref 0.0–2.0)
Bicarbonate: 41.6 mmol/L — ABNORMAL HIGH (ref 20.0–28.0)
Calcium, Ion: 1.14 mmol/L — ABNORMAL LOW (ref 1.15–1.40)
HCT: 43 % (ref 36.0–46.0)
Hemoglobin: 14.6 g/dL (ref 12.0–15.0)
O2 Saturation: 90 %
Patient temperature: 97.3
Potassium: 3.4 mmol/L — ABNORMAL LOW (ref 3.5–5.1)
Sodium: 139 mmol/L (ref 135–145)
TCO2: 44 mmol/L — AB (ref 22–32)
pCO2 arterial: 70.6 mmHg (ref 32.0–48.0)
pH, Arterial: 7.375 (ref 7.350–7.450)
pO2, Arterial: 62 mmHg — ABNORMAL LOW (ref 83.0–108.0)

## 2018-11-29 LAB — BASIC METABOLIC PANEL
Anion gap: 12 (ref 5–15)
BUN: 12 mg/dL (ref 6–20)
CALCIUM: 8.2 mg/dL — AB (ref 8.9–10.3)
CO2: 32 mmol/L (ref 22–32)
CREATININE: 0.74 mg/dL (ref 0.44–1.00)
Chloride: 95 mmol/L — ABNORMAL LOW (ref 98–111)
GFR calc Af Amer: >60 mL/min (ref >60)
GFR calc non Af Amer: >60 mL/min (ref >60)
Glucose, Bld: 123 mg/dL — ABNORMAL HIGH (ref 70–99)
Potassium: 3.5 mmol/L (ref 3.5–5.1)
Sodium: 139 mmol/L (ref 135–145)

## 2018-11-29 LAB — CBC
HCT: 45.3 % (ref 36.0–46.0)
Hemoglobin: 13.8 g/dL (ref 12.0–15.0)
MCH: 29.4 pg (ref 26.0–34.0)
MCHC: 30.5 g/dL (ref 30.0–36.0)
MCV: 96.4 fL (ref 80.0–100.0)
Platelets: 155 10*3/uL (ref 150–400)
RBC: 4.7 MIL/uL (ref 3.87–5.11)
RDW: 14.6 % (ref 11.5–15.5)
WBC: 7.3 10*3/uL (ref 4.0–10.5)
nRBC: 0.3 % — ABNORMAL HIGH (ref 0.0–0.2)

## 2018-11-29 LAB — MAGNESIUM
MAGNESIUM: 1.9 mg/dL (ref 1.7–2.4)
Magnesium: 1.7 mg/dL (ref 1.7–2.4)
Magnesium: 1.8 mg/dL (ref 1.7–2.4)

## 2018-11-29 LAB — RAPID URINE DRUG SCREEN, HOSP PERFORMED
Amphetamines: NOT DETECTED
BARBITURATES: NOT DETECTED
Benzodiazepines: POSITIVE — AB
Cocaine: NOT DETECTED
Opiates: NOT DETECTED
Tetrahydrocannabinol: NOT DETECTED

## 2018-11-29 LAB — GLUCOSE, CAPILLARY
Glucose-Capillary: 105 mg/dL — ABNORMAL HIGH (ref 70–99)
Glucose-Capillary: 113 mg/dL — ABNORMAL HIGH (ref 70–99)
Glucose-Capillary: 113 mg/dL — ABNORMAL HIGH (ref 70–99)
Glucose-Capillary: 124 mg/dL — ABNORMAL HIGH (ref 70–99)
Glucose-Capillary: 135 mg/dL — ABNORMAL HIGH (ref 70–99)
Glucose-Capillary: 144 mg/dL — ABNORMAL HIGH (ref 70–99)

## 2018-11-29 LAB — ECHOCARDIOGRAM COMPLETE
Height: 58 in
Weight: 4321.02 oz

## 2018-11-29 LAB — STREP PNEUMONIAE URINARY ANTIGEN: Strep Pneumo Urinary Antigen: NEGATIVE

## 2018-11-29 LAB — PROCALCITONIN: Procalcitonin: <0.1 ng/mL

## 2018-11-29 LAB — HEMOGLOBIN A1C
Hgb A1c MFr Bld: 6.2 % — ABNORMAL HIGH (ref 4.8–5.6)
Mean Plasma Glucose: 131.24 mg/dL

## 2018-11-29 LAB — PHOSPHORUS
Phosphorus: 2.8 mg/dL (ref 2.5–4.6)
Phosphorus: 3.2 mg/dL (ref 2.5–4.6)

## 2018-11-29 LAB — HIV ANTIBODY (ROUTINE TESTING W REFLEX): HIV Screen 4th Generation wRfx: NONREACTIVE

## 2018-11-29 MED ORDER — PERFLUTREN LIPID MICROSPHERE
INTRAVENOUS | Status: AC
  Filled 2018-11-29: qty 10

## 2018-11-29 MED ORDER — VITAL HIGH PROTEIN PO LIQD
1000.0000 mL | ORAL | Status: DC
  Administered 2018-11-29 – 2018-12-05 (×9): 1000 mL

## 2018-11-29 MED ORDER — FUROSEMIDE 10 MG/ML IJ SOLN
40.0000 mg | Freq: Once | INTRAMUSCULAR | Status: AC
  Administered 2018-11-29: 40 mg via INTRAVENOUS
  Filled 2018-11-29: qty 4

## 2018-11-29 MED ORDER — PERFLUTREN LIPID MICROSPHERE
1.0000 mL | INTRAVENOUS | Status: AC | PRN
  Administered 2018-11-29: 6 mL via INTRAVENOUS
  Filled 2018-11-29: qty 10

## 2018-11-29 MED ORDER — PRO-STAT SUGAR FREE PO LIQD
30.0000 mL | Freq: Two times a day (BID) | ORAL | Status: DC
  Administered 2018-11-29 – 2018-12-05 (×13): 30 mL
  Filled 2018-11-29 (×14): qty 30

## 2018-11-29 MED ORDER — ADULT MULTIVITAMIN LIQUID CH
15.0000 mL | Freq: Every day | ORAL | Status: DC
  Administered 2018-11-29 – 2018-12-10 (×12): 15 mL
  Filled 2018-11-29 (×12): qty 15

## 2018-11-29 MED ORDER — PANTOPRAZOLE SODIUM 40 MG PO PACK
40.0000 mg | PACK | ORAL | Status: DC
  Administered 2018-11-29 – 2018-12-10 (×12): 40 mg
  Filled 2018-11-29 (×12): qty 20

## 2018-11-29 MED ORDER — POTASSIUM CHLORIDE 20 MEQ/15ML (10%) PO SOLN
40.0000 meq | Freq: Once | ORAL | Status: AC
  Administered 2018-11-29: 40 meq
  Filled 2018-11-29: qty 30

## 2018-11-29 NOTE — Progress Notes (Signed)
Initial Nutrition Assessment  DOCUMENTATION CODES:   Morbid obesity  INTERVENTION:  Initiate TF with Vital High Protein at goal rate of 40 ml/h (960 ml per day) and Prostat 30 ml BID to provide 1160 kcals, 114 gm protein, 806 ml free water daily.  Provide liquid MVI once daily per tube.   NUTRITION DIAGNOSIS:   Inadequate oral intake related to inability to eat as evidenced by NPO status.  GOAL:   Provide needs based on ASPEN/SCCM guidelines  MONITOR:   TF tolerance, Labs, Skin, Vent status, Weight trends, I & O's  REASON FOR ASSESSMENT:   Ventilator, Consult Enteral/tube feeding initiation and management  ASSESSMENT:   58 yo female presented with of cough, fever, chills, dyspnea.  Found to have hypoxia with interstitial infiltrates requiring intubation. Pt with acute on chronic hypoxic/hypercapnic respiratory failure from Influenza B pneumonia.  Patient is currently intubated on ventilator support MV: 7.6 L/min Temp (24hrs), Avg:98.3 F (36.8 C), Min:97.1 F (36.2 C), Max:98.8 F (37.1 C)  Propofol: none  RD consulted for enteral/tube feeding initiation and management. No family at bedside. Unable to obtain pt's nutrition history. Per RN, pt's vent settings are high, thus will remain intubated. Labs and medications reviewed.   NUTRITION - FOCUSED PHYSICAL EXAM:    Most Recent Value  Orbital Region  No depletion  Upper Arm Region  No depletion  Thoracic and Lumbar Region  No depletion  Buccal Region  No depletion  Temple Region  No depletion  Clavicle Bone Region  No depletion  Clavicle and Acromion Bone Region  No depletion  Scapular Bone Region  Unable to assess  Dorsal Hand  Unable to assess  Patellar Region  No depletion  Anterior Thigh Region  No depletion  Posterior Calf Region  No depletion  Edema (RD Assessment)  Moderate  Hair  Reviewed  Eyes  Unable to assess  Mouth  Unable to assess  Skin  Reviewed  Nails  Unable to assess       Diet  Order:   Diet Order            Diet NPO time specified  Diet effective now              EDUCATION NEEDS:   Not appropriate for education at this time  Skin:  Skin Assessment: Reviewed RN Assessment  Last BM:  3/5  Height:   Ht Readings from Last 1 Encounters:  11/28/18 4\' 10"  (1.473 m)    Weight:   Wt Readings from Last 1 Encounters:  11/29/18 122.5 kg    Ideal Body Weight:  43.76 kg  BMI:  Body mass index is 56.44 kg/m.  Estimated Nutritional Needs:   Kcal:  1100  Protein:  >/= 109 grams  Fluid:  >/= 1.5 L/day    Roslyn Smiling, MS, RD, LDN Pager # [phone] After hours/ weekend pager # [phone]

## 2018-11-29 NOTE — Progress Notes (Signed)
  Echocardiogram 2D Echocardiogram has been performed.  Gerda Diss 11/29/2018, 12:53 PM

## 2018-11-29 NOTE — Progress Notes (Signed)
Fio2 increased to 70% per ABG results.

## 2018-11-29 NOTE — Progress Notes (Signed)
NAME:  Veronica Castaneda, MRN:  [id number], DOB:  1961/09/21, LOS: 1 ADMISSION DATE:  11/28/2018, CONSULTATION DATE:  3/6 REFERRING MD:  isaacs, CHIEF COMPLAINT:  Acute respiratory failure    Brief History   58 yo Spanish speaking female presented with 3 days of cough, fever, chills, dyspnea.  Found to have hypoxia with interstitial infiltrates.  Required intubation.  Past Medical History  No significant medical history  Significant Hospital Events   3/6 admitted  Consults:    Procedures:  ETT 3/6>>>  Significant Diagnostic Tests:  Echo 3/07 >>  Micro Data:  Influenza 3/6>>>Flu B Sputum 3/6>>> BCXx2 3/6>>> UC 3/6>>> RVP 3/6>>>Flu B Pneumococcal Ag 3/07>>>negative Legionella Ag 3/07>>>  Antimicrobials:  azithro 3/6>>>3/7 Rocephin 3/6>>>  Interim history/subjective:  Remains on full vent support  Objective   Blood pressure 118/72, pulse 71, temperature 98.7 F (37.1 C), temperature source Oral, resp. rate 18, height 4\' 10"  (1.473 m), weight 122.5 kg, SpO2 92 %.    Vent Mode: PRVC FiO2 (%):  [50 %-80 %] 70 % Set Rate:  [14 bmp-18 bmp] 18 bmp Vt Set:  [350 mL] 350 mL PEEP:  [5 cmH20-8 cmH20] 8 cmH20 Plateau Pressure:  [18 cmH20-24 cmH20] 18 cmH20   Intake/Output Summary (Last 24 hours) at 11/29/2018 1517 Last data filed at 11/29/2018 0900 Gross per 24 hour  Intake 647.44 ml  Output 850 ml  Net -202.56 ml   Filed Weights   11/28/18 1341 11/28/18 1832 11/29/18 0500  Weight: 122.5 kg 122.5 kg 122.5 kg   Physical Exam:  General - alert Eyes - pupils reactive ENT - ETT in place Cardiac - regular rate/rhythm, no murmur Chest - b/l rhonchi Abdomen - soft, non tender, + bowel sounds Extremities - 1+ edema Skin - no rashes Neuro - moves extremities, RASS -1  CXR (reviewed by me) - b/l interstitial ASD   Resolved Hospital Problem list     Assessment & Plan:   Acute on chronic hypoxic/hypercapnic respiratory failure from Influenza B  pneumonia. Likely also has OSA and OHS. Plan - full vent support - adjust PEEP/FiO2 to keep SpO2 90 to 95% - f/u CXR - day 2 of rocephin - d/c zithromax  - outside time frame for tamiflu - lasix 40 mg IV x one 3/07 - f/u Echo  Hypokalemia. Plan - replace as needed  Acute metabolic encephalopathy. Plan - RASS goal 0 to -1  Best practice:  Diet: tube feeds DVT prophylaxis: [place] heparin  GI prophylaxis: protonix Mobility: bed rest Code Status: full code  Family Communication: updated family at bedside with assistance of Spanish interpreter Disposition: ICU  Labs    CMP Latest Ref Rng & Units 11/29/2018 11/29/2018 11/28/2018  Glucose 70 - 99 mg/dL - 616(W) -  BUN 6 - 20 mg/dL - 12 -  Creatinine 7.37 - 1.00 mg/dL - 1.06 -  Sodium 269 - 145 mmol/L 139 139 139  Potassium 3.5 - 5.1 mmol/L 3.4(L) 3.5 4.4  Chloride 98 - 111 mmol/L - 95(L) -  CO2 22 - 32 mmol/L - 32 -  Calcium 8.9 - 10.3 mg/dL - 8.2(L) -  Total Protein 6.5 - 8.1 g/dL - - -  Total Bilirubin 0.3 - 1.2 mg/dL - - -  Alkaline Phos 38 - 126 U/L - - -  AST 15 - 41 U/L - - -  ALT 0 - 44 U/L - - -   CBC Latest Ref Rng & Units 11/29/2018 11/29/2018 11/28/2018  WBC 4.0 - 10.5  K/uL - 7.3 -  Hemoglobin 12.0 - 15.0 g/dL 43.2 76.1 47.0  Hematocrit 36.0 - 46.0 % 43.0 45.3 43.0  Platelets 150 - 400 K/uL - 155 -   ABG    Component Value Date/Time   PHART 7.375 11/29/2018 0355   PCO2ART 70.6 (HH) 11/29/2018 0355   PO2ART 62.0 (L) 11/29/2018 0355   HCO3 41.6 (H) 11/29/2018 0355   TCO2 44 (H) 11/29/2018 0355   O2SAT 90.0 11/29/2018 0355   CBG (last 3)  Recent Labs    11/29/18 0029 11/29/18 0351 11/29/18 0733  GLUCAP 105* 113* 124*   CC time 31 minutes  Coralyn Helling, MD [facility] Pulmonary/Critical Care 11/29/2018, 9:42 AM

## 2018-11-30 ENCOUNTER — Inpatient Hospital Stay (HOSPITAL_COMMUNITY): Payer: Self-pay

## 2018-11-30 DIAGNOSIS — J9602 Acute respiratory failure with hypercapnia: Secondary | ICD-10-CM

## 2018-11-30 DIAGNOSIS — J9601 Acute respiratory failure with hypoxia: Secondary | ICD-10-CM

## 2018-11-30 LAB — GLUCOSE, CAPILLARY
GLUCOSE-CAPILLARY: 146 mg/dL — AB (ref 70–99)
GLUCOSE-CAPILLARY: 140 mg/dL — AB (ref 70–99)
GLUCOSE-CAPILLARY: 120 mg/dL — AB (ref 70–99)
Glucose-Capillary: 125 mg/dL — ABNORMAL HIGH (ref 70–99)
Glucose-Capillary: 138 mg/dL — ABNORMAL HIGH (ref 70–99)
Glucose-Capillary: 147 mg/dL — ABNORMAL HIGH (ref 70–99)

## 2018-11-30 LAB — BASIC METABOLIC PANEL
Anion gap: 7 (ref 5–15)
BUN: 17 mg/dL (ref 6–20)
CO2: 37 mmol/L — ABNORMAL HIGH (ref 22–32)
Calcium: 8.4 mg/dL — ABNORMAL LOW (ref 8.9–10.3)
Chloride: 97 mmol/L — ABNORMAL LOW (ref 98–111)
Creatinine, Ser: 0.63 mg/dL (ref 0.44–1.00)
GFR calc Af Amer: >60 mL/min (ref >60)
GFR calc non Af Amer: >60 mL/min (ref >60)
Glucose, Bld: 140 mg/dL — ABNORMAL HIGH (ref 70–99)
Potassium: 3.4 mmol/L — ABNORMAL LOW (ref 3.5–5.1)
Sodium: 141 mmol/L (ref 135–145)

## 2018-11-30 LAB — CBC
HEMATOCRIT: 47.4 % — AB (ref 36.0–46.0)
HEMOGLOBIN: 14.1 g/dL (ref 12.0–15.0)
MCH: 28.4 pg (ref 26.0–34.0)
MCHC: 29.7 g/dL — ABNORMAL LOW (ref 30.0–36.0)
MCV: 95.6 fL (ref 80.0–100.0)
Platelets: 159 10*3/uL (ref 150–400)
RBC: 4.96 MIL/uL (ref 3.87–5.11)
RDW: 14.6 % (ref 11.5–15.5)
WBC: 8.7 10*3/uL (ref 4.0–10.5)
nRBC: 0 % (ref 0.0–0.2)

## 2018-11-30 LAB — PHOSPHORUS
Phosphorus: 2.9 mg/dL (ref 2.5–4.6)
Phosphorus: 2.7 mg/dL (ref 2.5–4.6)

## 2018-11-30 LAB — PROCALCITONIN: Procalcitonin: <0.1 ng/mL

## 2018-11-30 LAB — URINE CULTURE: Culture: NO GROWTH

## 2018-11-30 LAB — MAGNESIUM
Magnesium: 1.9 mg/dL (ref 1.7–2.4)
Magnesium: 1.9 mg/dL (ref 1.7–2.4)

## 2018-11-30 MED ORDER — POTASSIUM CHLORIDE 20 MEQ/15ML (10%) PO SOLN
40.0000 meq | Freq: Once | ORAL | Status: AC
  Administered 2018-11-30: 40 meq
  Filled 2018-11-30: qty 30

## 2018-11-30 MED ORDER — FUROSEMIDE 10 MG/ML IJ SOLN
40.0000 mg | Freq: Once | INTRAMUSCULAR | Status: AC
  Administered 2018-11-30: 40 mg via INTRAVENOUS
  Filled 2018-11-30: qty 4

## 2018-11-30 MED ORDER — DOCUSATE SODIUM 50 MG/5ML PO LIQD
100.0000 mg | Freq: Two times a day (BID) | ORAL | Status: DC
  Administered 2018-11-30 – 2018-12-13 (×23): 100 mg
  Filled 2018-11-30 (×23): qty 10

## 2018-11-30 NOTE — Progress Notes (Signed)
NAME:  Veronica Castaneda, MRN:  [id number], DOB:  April 09, 1961, LOS: 2 ADMISSION DATE:  11/28/2018, CONSULTATION DATE:  3/6 REFERRING MD:  isaacs, CHIEF COMPLAINT:  Acute respiratory failure    Brief History   58 yo Spanish speaking female presented with 3 days of cough, fever, chills, dyspnea.  Found to have hypoxia with interstitial infiltrates.  Required intubation.  Past Medical History  No significant medical history  Significant Hospital Events   3/6 admitted  Consults:    Procedures:  ETT 3/6>>>  Significant Diagnostic Tests:  Echo 3/07 >> EF 45 to 50%  Micro Data:  Influenza 3/6>>>Flu B Sputum 3/6>>>oral flora BCXx2 3/6>>> UC 3/6>>>negative RVP 3/6>>>Flu B Pneumococcal Ag 3/07>>>negative Legionella Ag 3/07>>>  Antimicrobials:  Zithromax 3/6>>>3/7 Rocephin 3/6>>>  Interim history/subjective:  Remains on full vent support with increased FiO2 and PEEP.  Objective   Blood pressure 107/73, pulse 79, temperature 99.2 F (37.3 C), temperature source Oral, resp. rate (!) 24, height 4\' 10"  (1.473 m), weight 119.3 kg, SpO2 93 %.    Vent Mode: PRVC FiO2 (%):  [70 %] 70 % Set Rate:  [18 bmp] 18 bmp Vt Set:  [350 mL] 350 mL PEEP:  [8 cmH20] 8 cmH20 Plateau Pressure:  [17 cmH20-25 cmH20] 20 cmH20   Intake/Output Summary (Last 24 hours) at 11/30/2018 1134 Last data filed at 11/30/2018 1000 Gross per 24 hour  Intake 1578.03 ml  Output 2830 ml  Net -1251.97 ml   Filed Weights   11/28/18 1832 11/29/18 0500 11/30/18 0500  Weight: 122.5 kg 122.5 kg 119.3 kg   Physical Exam:  General - alert Eyes - pupils reactive ENT - ETT in place Cardiac - regular rate/rhythm, no murmur Chest - decreased BS, scattered rhonchi Abdomen - soft, non tender, + bowel sounds Extremities - 1+ edema Skin - no rashes Neuro - RASS 0  CXR (reviewed by me) - CM, interstitial edema   Resolved Hospital Problem list     Assessment & Plan:   Acute on chronic hypoxic/hypercapnic  respiratory failure from Influenza B pneumonia. Likely also has OSA and OHS. Plan - full vent support - goal SpO2 90 to 95% - day 3 of rocephin - outside timeframe for tamiflu - f/u CXR  Systolic CHF >> undetermined whether acute or chronic. Plan - lasix 40 mg IV x one 3/08 - will need cardiology assessment when more stable  Hypokalemia. Plan - replace as needed  Acute metabolic encephalopathy. Plan - RASS goal 0 to -1  Constipation. Plan - adjust bowel regimen  Best practice:  Diet: tube feeds DVT prophylaxis: [place] heparin  GI prophylaxis: protonix Mobility: bed rest Code Status: full code  Family Communication: updated family at bedside Disposition: ICU  Labs    CMP Latest Ref Rng & Units 11/30/2018 11/29/2018 11/29/2018  Glucose 70 - 99 mg/dL 482(N) - 003(B)  BUN 6 - 20 mg/dL 17 - 12  Creatinine 0.48 - 1.00 mg/dL 8.89 - 1.69  Sodium 450 - 145 mmol/L 141 139 139  Potassium 3.5 - 5.1 mmol/L 3.4(L) 3.4(L) 3.5  Chloride 98 - 111 mmol/L 97(L) - 95(L)  CO2 22 - 32 mmol/L 37(H) - 32  Calcium 8.9 - 10.3 mg/dL 3.8(U) - 8.2(L)  Total Protein 6.5 - 8.1 g/dL - - -  Total Bilirubin 0.3 - 1.2 mg/dL - - -  Alkaline Phos 38 - 126 U/L - - -  AST 15 - 41 U/L - - -  ALT 0 - 44 U/L - - -  CBC Latest Ref Rng & Units 11/30/2018 11/29/2018 11/29/2018  WBC 4.0 - 10.5 K/uL 8.7 - 7.3  Hemoglobin 12.0 - 15.0 g/dL 97.0 26.3 78.5  Hematocrit 36.0 - 46.0 % 47.4(H) 43.0 45.3  Platelets 150 - 400 K/uL 159 - 155   ABG    Component Value Date/Time   PHART 7.375 11/29/2018 0355   PCO2ART 70.6 (HH) 11/29/2018 0355   PO2ART 62.0 (L) 11/29/2018 0355   HCO3 41.6 (H) 11/29/2018 0355   TCO2 44 (H) 11/29/2018 0355   O2SAT 90.0 11/29/2018 0355   CBG (last 3)  Recent Labs    11/30/18 0003 11/30/18 0448 11/30/18 0813  GLUCAP 147* 146* 140*   CC time 32 minutes  Coralyn Helling, MD [facility] Pulmonary/Critical Care 11/30/2018, 11:34 AM

## 2018-12-01 ENCOUNTER — Inpatient Hospital Stay (HOSPITAL_COMMUNITY): Payer: Self-pay

## 2018-12-01 DIAGNOSIS — G9340 Encephalopathy, unspecified: Secondary | ICD-10-CM

## 2018-12-01 LAB — CULTURE, RESPIRATORY W GRAM STAIN: Culture: NORMAL

## 2018-12-01 LAB — CBC
HCT: 47.4 % — ABNORMAL HIGH (ref 36.0–46.0)
Hemoglobin: 14.3 g/dL (ref 12.0–15.0)
MCH: 29.2 pg (ref 26.0–34.0)
MCHC: 30.2 g/dL (ref 30.0–36.0)
MCV: 96.7 fL (ref 80.0–100.0)
Platelets: 164 K/uL (ref 150–400)
RBC: 4.9 MIL/uL (ref 3.87–5.11)
RDW: 14.8 % (ref 11.5–15.5)
WBC: 7.5 K/uL (ref 4.0–10.5)
nRBC: 0 % (ref 0.0–0.2)

## 2018-12-01 LAB — GLUCOSE, CAPILLARY
Glucose-Capillary: 118 mg/dL — ABNORMAL HIGH (ref 70–99)
Glucose-Capillary: 124 mg/dL — ABNORMAL HIGH (ref 70–99)
Glucose-Capillary: 130 mg/dL — ABNORMAL HIGH (ref 70–99)
Glucose-Capillary: 132 mg/dL — ABNORMAL HIGH (ref 70–99)
Glucose-Capillary: 134 mg/dL — ABNORMAL HIGH (ref 70–99)
Glucose-Capillary: 134 mg/dL — ABNORMAL HIGH (ref 70–99)
Glucose-Capillary: 139 mg/dL — ABNORMAL HIGH (ref 70–99)

## 2018-12-01 LAB — BASIC METABOLIC PANEL WITH GFR
Anion gap: 11 (ref 5–15)
BUN: 19 mg/dL (ref 6–20)
CO2: 34 mmol/L — ABNORMAL HIGH (ref 22–32)
Calcium: 8.8 mg/dL — ABNORMAL LOW (ref 8.9–10.3)
Chloride: 99 mmol/L (ref 98–111)
Creatinine, Ser: 0.68 mg/dL (ref 0.44–1.00)
GFR calc Af Amer: >60 mL/min
GFR calc non Af Amer: >60 mL/min
Glucose, Bld: 137 mg/dL — ABNORMAL HIGH (ref 70–99)
Potassium: 3.6 mmol/L (ref 3.5–5.1)
Sodium: 144 mmol/L (ref 135–145)

## 2018-12-01 LAB — LEGIONELLA PNEUMOPHILA SEROGP 1 UR AG: L. pneumophila Serogp 1 Ur Ag: NEGATIVE

## 2018-12-01 LAB — POCT I-STAT TROPONIN I: TROPONIN I, POC: 0.05 ng/mL (ref 0.00–0.08)

## 2018-12-01 MED ORDER — POTASSIUM CHLORIDE 20 MEQ/15ML (10%) PO SOLN
40.0000 meq | Freq: Three times a day (TID) | ORAL | Status: AC
  Administered 2018-12-01 (×2): 40 meq
  Filled 2018-12-01 (×2): qty 30

## 2018-12-01 MED ORDER — NAPHAZOLINE-PHENIRAMINE 0.025-0.3 % OP SOLN
1.0000 [drp] | Freq: Four times a day (QID) | OPHTHALMIC | Status: DC | PRN
  Administered 2018-12-01 – 2018-12-09 (×3): 1 [drp] via OPHTHALMIC
  Filled 2018-12-01: qty 15

## 2018-12-01 MED ORDER — FUROSEMIDE 10 MG/ML IJ SOLN
40.0000 mg | Freq: Four times a day (QID) | INTRAMUSCULAR | Status: AC
  Administered 2018-12-01 (×3): 40 mg via INTRAVENOUS
  Filled 2018-12-01 (×3): qty 4

## 2018-12-01 NOTE — Progress Notes (Signed)
eLink Physician-Brief Progress Note [name] DOB: [birth year] MRN: [id number]   Date of Service  12/01/2018  HPI/Events of Note  Patient c/o itchy eyes.  eICU Interventions  Will order" 1. Naphcon-A 1 gtt to both eyes Q 4 hours PRN itching or irritation.     Intervention Category Major Interventions: Other:  Jase Reep Dennard Nip 12/01/2018, 10:07 PM

## 2018-12-01 NOTE — Progress Notes (Signed)
NAME:  Veronica Castaneda, MRN:  [id number], DOB:  04-17-1961, LOS: 3 ADMISSION DATE:  11/28/2018, CONSULTATION DATE:  3/6 REFERRING MD:  isaacs, CHIEF COMPLAINT:  Acute respiratory failure    Brief History   58 yo Spanish speaking female presented with 3 days of cough, fever, chills, dyspnea.  Found to have hypoxia with interstitial infiltrates.  Required intubation.  Past Medical History  No significant medical history  Significant Hospital Events   3/6 admitted  Consults:    Procedures:  ETT 3/6>>>  Significant Diagnostic Tests:  Echo 3/07 >> EF 45 to 50%  Micro Data:  Influenza 3/6>>>Flu B Sputum 3/6>>>oral flora BCXx2 3/6>>> UC 3/6>>>negative RVP 3/6>>>Flu B Pneumococcal Ag 3/07>>>negative Legionella Ag 3/07>>>  Antimicrobials:  Zithromax 3/6>>>3/7 Rocephin 3/6>>>  Interim history/subjective:  No events overnight, did not wean this AM  Objective   Blood pressure 118/72, pulse 64, temperature 99.9 F (37.7 C), temperature source Oral, resp. rate 12, height 4\' 10"  (1.473 m), weight 118.1 kg, SpO2 (!) 89 %.    Vent Mode: PRVC FiO2 (%):  [60 %-70 %] 60 % Set Rate:  [18 bmp] 18 bmp Vt Set:  [35 mL-350 mL] 35 mL PEEP:  [8 cmH20] 8 cmH20 Plateau Pressure:  [15 cmH20-23 cmH20] 23 cmH20   Intake/Output Summary (Last 24 hours) at 12/01/2018 1222 Last data filed at 12/01/2018 1058 Gross per 24 hour  Intake 1249.72 ml  Output 1730 ml  Net -480.28 ml   Filed Weights   11/29/18 0500 11/30/18 0500 12/01/18 0500  Weight: 122.5 kg 119.3 kg 118.1 kg   Physical Exam:  General - Alert and interactive Eyes - PERRL, EOM-I ENT - ETT in place Cardiac - RRR, Nl S1/S2 and -M/R/G Chest - Distant BS diffusely Abdomen - Soft, NT, ND and +BS Extremities - 1+ edema Skin - Intact Neuro - RASS 0  I reviewed CXR myself, ETT is in a good position.   Resolved Hospital Problem list     Assessment & Plan:   Acute on chronic hypoxic/hypercapnic respiratory failure  from Influenza B pneumonia. Likely also has OSA and OHS. Plan - Decrease FiO2 to 50 and increase PEEP to 10 - Goal is to get to 40% and 5 by the end of the day - Diureses as below - Day 4 of rocephin - Outside timeframe for tamiflu - F/u CXR  Systolic CHF >> undetermined whether acute or chronic. Plan - Lasix 40 mg IV q6 x3 doses - Cards to evaluate after acute phase  Hypokalemia. Plan - Replace electrolytes as needed - BMET in AM - KVO IVF - Lasix  Acute metabolic encephalopathy. Plan - RASS goal 0 to -1  Constipation. Plan - Adjust bowel regimen - TF  Best practice:  Diet: tube feeds DVT prophylaxis: [place] heparin  GI prophylaxis: protonix Mobility: bed rest Code Status: full code  Family Communication: updated family at bedside Disposition: ICU  Labs    CMP Latest Ref Rng & Units 12/01/2018 11/30/2018 11/29/2018  Glucose 70 - 99 mg/dL 211(Z) 735(A) -  BUN 6 - 20 mg/dL 19 17 -  Creatinine 7.01 - 1.00 mg/dL 4.10 3.01 -  Sodium 314 - 145 mmol/L 144 141 139  Potassium 3.5 - 5.1 mmol/L 3.6 3.4(L) 3.4(L)  Chloride 98 - 111 mmol/L 99 97(L) -  CO2 22 - 32 mmol/L 34(H) 37(H) -  Calcium 8.9 - 10.3 mg/dL 3.8(O) 8.7(N) -  Total Protein 6.5 - 8.1 g/dL - - -  Total Bilirubin 0.3 -  1.2 mg/dL - - -  Alkaline Phos 38 - 126 U/L - - -  AST 15 - 41 U/L - - -  ALT 0 - 44 U/L - - -   CBC Latest Ref Rng & Units 12/01/2018 11/30/2018 11/29/2018  WBC 4.0 - 10.5 K/uL 7.5 8.7 -  Hemoglobin 12.0 - 15.0 g/dL 70.3 50.0 93.8  Hematocrit 36.0 - 46.0 % 47.4(H) 47.4(H) 43.0  Platelets 150 - 400 K/uL 164 159 -   ABG    Component Value Date/Time   PHART 7.375 11/29/2018 0355   PCO2ART 70.6 (HH) 11/29/2018 0355   PO2ART 62.0 (L) 11/29/2018 0355   HCO3 41.6 (H) 11/29/2018 0355   TCO2 44 (H) 11/29/2018 0355   O2SAT 90.0 11/29/2018 0355   CBG (last 3)  Recent Labs    12/01/18 0446 12/01/18 0813 12/01/18 1135  GLUCAP 132* 134* 134*   The patient is critically ill with multiple organ  systems failure and requires high complexity decision making for assessment and support, frequent evaluation and titration of therapies, application of advanced monitoring technologies and extensive interpretation of multiple databases.   Critical Care Time devoted to patient care services described in this note is  32  Minutes. This time reflects time of care of this signee Dr Koren Bound. This critical care time does not reflect procedure time, or teaching time or supervisory time of PA/NP/Med student/Med Resident etc but could involve care discussion time.  Alyson Reedy, M.D. [facility] Pulmonary/Critical Care Medicine. Pager: [phone]. After hours pager: [phone].

## 2018-12-02 ENCOUNTER — Inpatient Hospital Stay (HOSPITAL_COMMUNITY): Payer: Self-pay

## 2018-12-02 DIAGNOSIS — J189 Pneumonia, unspecified organism: Secondary | ICD-10-CM

## 2018-12-02 LAB — POCT I-STAT 7, (LYTES, BLD GAS, ICA,H+H)
Acid-Base Excess: 14 mmol/L — ABNORMAL HIGH (ref 0.0–2.0)
Bicarbonate: 41.5 mmol/L — ABNORMAL HIGH (ref 20.0–28.0)
Calcium, Ion: 1.21 mmol/L (ref 1.15–1.40)
HEMATOCRIT: 46 % (ref 36.0–46.0)
Hemoglobin: 15.6 g/dL — ABNORMAL HIGH (ref 12.0–15.0)
O2 Saturation: 90 %
Potassium: 3.8 mmol/L (ref 3.5–5.1)
Sodium: 144 mmol/L (ref 135–145)
TCO2: 43 mmol/L — ABNORMAL HIGH (ref 22–32)
pCO2 arterial: 64.2 mmHg — ABNORMAL HIGH (ref 32.0–48.0)
pH, Arterial: 7.419 (ref 7.350–7.450)
pO2, Arterial: 61 mmHg — ABNORMAL LOW (ref 83.0–108.0)

## 2018-12-02 LAB — BASIC METABOLIC PANEL
Anion gap: 11 (ref 5–15)
BUN: 27 mg/dL — ABNORMAL HIGH (ref 6–20)
CO2: 35 mmol/L — ABNORMAL HIGH (ref 22–32)
Calcium: 9.2 mg/dL (ref 8.9–10.3)
Chloride: 98 mmol/L (ref 98–111)
Creatinine, Ser: 0.82 mg/dL (ref 0.44–1.00)
Glucose, Bld: 153 mg/dL — ABNORMAL HIGH (ref 70–99)
Potassium: 4.2 mmol/L (ref 3.5–5.1)
Sodium: 144 mmol/L (ref 135–145)

## 2018-12-02 LAB — CBC
HCT: 49.6 % — ABNORMAL HIGH (ref 36.0–46.0)
Hemoglobin: 14.9 g/dL (ref 12.0–15.0)
MCH: 28.9 pg (ref 26.0–34.0)
MCHC: 30 g/dL (ref 30.0–36.0)
MCV: 96.1 fL (ref 80.0–100.0)
PLATELETS: 175 10*3/uL (ref 150–400)
RBC: 5.16 MIL/uL — ABNORMAL HIGH (ref 3.87–5.11)
RDW: 14.8 % (ref 11.5–15.5)
WBC: 8.9 10*3/uL (ref 4.0–10.5)
nRBC: 0 % (ref 0.0–0.2)

## 2018-12-02 LAB — GLUCOSE, CAPILLARY
Glucose-Capillary: 143 mg/dL — ABNORMAL HIGH (ref 70–99)
Glucose-Capillary: 142 mg/dL — ABNORMAL HIGH (ref 70–99)
Glucose-Capillary: 156 mg/dL — ABNORMAL HIGH (ref 70–99)
Glucose-Capillary: 154 mg/dL — ABNORMAL HIGH (ref 70–99)
Glucose-Capillary: 140 mg/dL — ABNORMAL HIGH (ref 70–99)

## 2018-12-02 LAB — PHOSPHORUS: Phosphorus: 4.4 mg/dL (ref 2.5–4.6)

## 2018-12-02 LAB — MAGNESIUM: Magnesium: 2.1 mg/dL (ref 1.7–2.4)

## 2018-12-02 MED ORDER — POTASSIUM CHLORIDE 20 MEQ/15ML (10%) PO SOLN
40.0000 meq | Freq: Once | ORAL | Status: AC
  Administered 2018-12-02: 40 meq
  Filled 2018-12-02: qty 30

## 2018-12-02 MED ORDER — FLEET ENEMA 7-19 GM/118ML RE ENEM
1.0000 | ENEMA | Freq: Every day | RECTAL | Status: DC | PRN
  Administered 2018-12-04: 1 via RECTAL
  Filled 2018-12-02: qty 1

## 2018-12-02 MED ORDER — FUROSEMIDE 10 MG/ML IJ SOLN
40.0000 mg | Freq: Four times a day (QID) | INTRAMUSCULAR | Status: AC
  Administered 2018-12-02 – 2018-12-03 (×3): 40 mg via INTRAVENOUS
  Filled 2018-12-02 (×3): qty 4

## 2018-12-02 MED ORDER — ACETAMINOPHEN 325 MG PO TABS
650.0000 mg | ORAL_TABLET | Freq: Four times a day (QID) | ORAL | Status: DC | PRN
  Administered 2018-12-02 – 2018-12-03 (×2): 650 mg via ORAL
  Filled 2018-12-02 (×2): qty 2

## 2018-12-02 MED ORDER — SENNOSIDES 8.8 MG/5ML PO SYRP
5.0000 mL | ORAL_SOLUTION | Freq: Every day | ORAL | Status: DC
  Administered 2018-12-02 – 2018-12-03 (×2): 5 mL
  Filled 2018-12-02 (×2): qty 5

## 2018-12-02 NOTE — Progress Notes (Signed)
eLink Physician-Brief Progress Note [name] DOB: 19-Oct-1960 MRN: [id number]   Date of Service  12/02/2018  HPI/Events of Note  Constipation - Bedside nurse states that the abdomen is distended. No BM since 11/28/2018.   eICU Interventions  Will order: 1. Fleets Enema now and daily PRN.  2. Abdominal film now.   Will try Fleets to see if patient has BM. Would like to see abdominal film prior to changing sedation to another agent.      Intervention Category Major Interventions: Other:  Syd Manges Dennard Nip 12/02/2018, 9:30 PM

## 2018-12-02 NOTE — Progress Notes (Addendum)
NAME:  Veronica Castaneda, MRN:  [id number], DOB:  [birth year], LOS: 4 ADMISSION DATE:  11/28/2018, CONSULTATION DATE:  3/6 REFERRING MD:  isaacs, CHIEF COMPLAINT:  Acute respiratory failure    Brief History   58 yo Spanish speaking female presented with 3 days of cough, fever, chills, dyspnea.  Found to have hypoxia with interstitial infiltrates.  Required intubation.  Past Medical History  No significant medical history  Significant Hospital Events   3/6 admitted  Consults:    Procedures:  ETT 3/6>>>  Significant Diagnostic Tests:  Echo 3/07 >> EF 45 to 50%  Micro Data:  Influenza 3/6>>>Flu B Sputum 3/6>>>oral flora BCXx2 3/6>>> UC 3/6>>>negative RVP 3/6>>>Flu B Pneumococcal Ag 3/07>>>negative Legionella Ag 3/07>>>  Antimicrobials:  Zithromax 3/6>>>3/7 Rocephin 3/6>>>  Interim history/subjective:  No significant change  Objective   Blood pressure 126/88, pulse 97, temperature 99.7 F (37.6 C), temperature source Oral, resp. rate (!) 21, height 4\' 10"  (1.473 m), weight 118.1 kg, SpO2 92 %.    Vent Mode: PRVC FiO2 (%):  [50 %-60 %] 50 % Set Rate:  [18 bmp] 18 bmp Vt Set:  [350 mL] 350 mL PEEP:  [8 cmH20-10 cmH20] 10 cmH20 Plateau Pressure:  [20 cmH20-24 cmH20] 24 cmH20   Intake/Output Summary (Last 24 hours) at 12/02/2018 1132 Last data filed at 12/02/2018 1000 Gross per 24 hour  Intake 1872.31 ml  Output 1865 ml  Net 7.31 ml   Filed Weights   11/30/18 0500 12/01/18 0500 12/02/18 0250  Weight: 119.3 kg 118.1 kg 118.1 kg   Physical Exam:  General: Morbid obese female no acute distress HEENT: Short neck no JVD is appreciated Neuro: Tracks and follows CV: s1s2 rrr, no m/r/g PULM: even/non-labored, lungs bilaterally diminished in the bases IW:LNLG, non-tender, bsx4 active  Extremities: warm/dry 1+ edema  Skin: no rashes or lesions   12/02/2018 chest x-ray reviewed no significant change   Resolved Hospital Problem list     Assessment &  Plan:   Acute on chronic hypoxic/hypercapnic respiratory failure from Influenza B pneumonia. Likely also has OSA and OHS. Plan -Wean ventilator as able note remains on PEEP of 10 to 50% has underlying OHS.  She may need tracheostomy because of liberated from mechanical ventilatory support. -Wean ventilator as tolerated -Continue diuresis -Day 5 Rocephin -Serial chest x-ray  Systolic CHF >> undetermined whether acute or chronic. Plan -Repeat Lasix x3 doses on 12/02/2018 -Consider cardiology consult with 2D echo revealing a EF of 45 to 50% with left ventricular diffuse hypokinesis  Hypokalemia. Recent Labs  Lab 12/01/18 0256 12/02/18 0405 12/02/18 0417  K 3.6 4.2 3.8    Plan -Replete electrolytes as needed -Electrolyte -Negative I&O as tolerated, repeat Lasix on 12/02/2018  Acute metabolic encephalopathy. Plan -RA SS goal 0 to -1, arouses to voice, tracks and follows  Constipation. Plan -Bowel regimen -Continue tube feedings  Best practice:  Diet: tube feeds DVT prophylaxis: [place] heparin  GI prophylaxis: protonix Mobility: bed rest Code Status: full code  Family Communication: 12/02/2018 no English-speaking family at bedside Disposition: ICU  Labs    CMP Latest Ref Rng & Units 12/02/2018 12/02/2018 12/01/2018  Glucose 70 - 99 mg/dL - 921(J) 941(D)  BUN 6 - 20 mg/dL - 40(C) 19  Creatinine 0.44 - 1.00 mg/dL - 1.44 8.18  Sodium 563 - 145 mmol/L 144 144 144  Potassium 3.5 - 5.1 mmol/L 3.8 4.2 3.6  Chloride 98 - 111 mmol/L - 98 99  CO2 22 - 32 mmol/L - 35(H)  34(H)  Calcium 8.9 - 10.3 mg/dL - 9.2 0.5(W)  Total Protein 6.5 - 8.1 g/dL - - -  Total Bilirubin 0.3 - 1.2 mg/dL - - -  Alkaline Phos 38 - 126 U/L - - -  AST 15 - 41 U/L - - -  ALT 0 - 44 U/L - - -   CBC Latest Ref Rng & Units 12/02/2018 12/02/2018 12/01/2018  WBC 4.0 - 10.5 K/uL - 8.9 7.5  Hemoglobin 12.0 - 15.0 g/dL 15.6(H) 14.9 14.3  Hematocrit 36.0 - 46.0 % 46.0 49.6(H) 47.4(H)  Platelets 150 - 400 K/uL - 175  164   ABG    Component Value Date/Time   PHART 7.419 12/02/2018 0417   PCO2ART 64.2 (H) 12/02/2018 0417   PO2ART 61.0 (L) 12/02/2018 0417   HCO3 41.5 (H) 12/02/2018 0417   TCO2 43 (H) 12/02/2018 0417   O2SAT 90.0 12/02/2018 0417   CBG (last 3)  Recent Labs    12/01/18 2351 12/02/18 0410 12/02/18 0823  GLUCAP 124* 143* 142*   App cct 34 min  Brett Canales Minor ACNP [facility] PCCM Pager [phone] till 1 pm If no answer page [phone] 12/02/2018, 11:33 AM  Attending Note:  58 year old female with morbid obesity and respiratory failure who presents with cough, fever and SOB and interstitial infiltrate.  Patient was diagnosed with CAP and was started on rocephin.  On exam, lungs are clear to auscultation with distant BS.  I reviewed CXR myself, ETT is in a good position and infiltrate noted.  Discussed with PCCM-NP.  Will continue full vent support.  Continue treatment for pneumonia.  Will meet with the family at 2:30 PM to decide on plan of care whether or not a tracheostomy is the appropriate course of action if patient does not tolerate extubation.  The patient is critically ill with multiple organ systems failure and requires high complexity decision making for assessment and support, frequent evaluation and titration of therapies, application of advanced monitoring technologies and extensive interpretation of multiple databases.   Critical Care Time devoted to patient care services described in this note is  33  Minutes. This time reflects time of care of this signee Dr Koren Bound. This critical care time does not reflect procedure time, or teaching time or supervisory time of PA/NP/Med student/Med Resident etc but could involve care discussion time.  Alyson Reedy, M.D. [facility] Pulmonary/Critical Care Medicine. Pager: [phone]. After hours pager: [phone].

## 2018-12-03 ENCOUNTER — Inpatient Hospital Stay (HOSPITAL_COMMUNITY): Payer: Self-pay

## 2018-12-03 DIAGNOSIS — Z7189 Other specified counseling: Secondary | ICD-10-CM

## 2018-12-03 LAB — BASIC METABOLIC PANEL
Anion gap: 11 (ref 5–15)
BUN: 33 mg/dL — ABNORMAL HIGH (ref 6–20)
CALCIUM: 9.1 mg/dL (ref 8.9–10.3)
CO2: 35 mmol/L — ABNORMAL HIGH (ref 22–32)
Chloride: 100 mmol/L (ref 98–111)
Creatinine, Ser: 0.86 mg/dL (ref 0.44–1.00)
GFR calc Af Amer: >60 mL/min (ref >60)
GFR calc non Af Amer: >60 mL/min (ref >60)
Glucose, Bld: 142 mg/dL — ABNORMAL HIGH (ref 70–99)
Potassium: 3.6 mmol/L (ref 3.5–5.1)
Sodium: 146 mmol/L — ABNORMAL HIGH (ref 135–145)

## 2018-12-03 LAB — CBC WITH DIFFERENTIAL/PLATELET
Abs Immature Granulocytes: 0.07 10*3/uL (ref 0.00–0.07)
Basophils Absolute: 0 10*3/uL (ref 0.0–0.1)
Basophils Relative: 0 %
EOS ABS: 0.1 10*3/uL (ref 0.0–0.5)
Eosinophils Relative: 1 %
HCT: 49.3 % — ABNORMAL HIGH (ref 36.0–46.0)
Hemoglobin: 14.7 g/dL (ref 12.0–15.0)
Immature Granulocytes: 1 %
LYMPHS ABS: 1.6 10*3/uL (ref 0.7–4.0)
Lymphocytes Relative: 17 %
MCH: 28.6 pg (ref 26.0–34.0)
MCHC: 29.8 g/dL — ABNORMAL LOW (ref 30.0–36.0)
MCV: 95.9 fL (ref 80.0–100.0)
Monocytes Absolute: 1 10*3/uL (ref 0.1–1.0)
Monocytes Relative: 10 %
Neutro Abs: 6.7 10*3/uL (ref 1.7–7.7)
Neutrophils Relative %: 71 %
Platelets: 181 10*3/uL (ref 150–400)
RBC: 5.14 MIL/uL — ABNORMAL HIGH (ref 3.87–5.11)
RDW: 14.6 % (ref 11.5–15.5)
WBC: 9.4 10*3/uL (ref 4.0–10.5)
nRBC: 0 % (ref 0.0–0.2)

## 2018-12-03 LAB — GLUCOSE, CAPILLARY
GLUCOSE-CAPILLARY: 159 mg/dL — AB (ref 70–99)
GLUCOSE-CAPILLARY: 142 mg/dL — AB (ref 70–99)
GLUCOSE-CAPILLARY: 140 mg/dL — AB (ref 70–99)
Glucose-Capillary: 129 mg/dL — ABNORMAL HIGH (ref 70–99)
Glucose-Capillary: 133 mg/dL — ABNORMAL HIGH (ref 70–99)
Glucose-Capillary: 145 mg/dL — ABNORMAL HIGH (ref 70–99)
Glucose-Capillary: 146 mg/dL — ABNORMAL HIGH (ref 70–99)

## 2018-12-03 LAB — MAGNESIUM: Magnesium: 2.3 mg/dL (ref 1.7–2.4)

## 2018-12-03 LAB — PHOSPHORUS: Phosphorus: 3.3 mg/dL (ref 2.5–4.6)

## 2018-12-03 MED ORDER — ACETAMINOPHEN 160 MG/5ML PO SOLN
650.0000 mg | Freq: Four times a day (QID) | ORAL | Status: DC | PRN
  Administered 2018-12-04 – 2018-12-08 (×9): 650 mg
  Filled 2018-12-03 (×14): qty 20.3

## 2018-12-03 MED ORDER — FUROSEMIDE 10 MG/ML IJ SOLN
40.0000 mg | Freq: Four times a day (QID) | INTRAMUSCULAR | Status: AC
  Administered 2018-12-03 (×3): 40 mg via INTRAVENOUS
  Filled 2018-12-03 (×3): qty 4

## 2018-12-03 MED ORDER — POTASSIUM CHLORIDE 20 MEQ/15ML (10%) PO SOLN
40.0000 meq | Freq: Once | ORAL | Status: AC
  Administered 2018-12-03: 40 meq
  Filled 2018-12-03: qty 30

## 2018-12-03 NOTE — Progress Notes (Signed)
   12/03/18 1500  Clinical Encounter Type  Visited With Patient;Patient and family together  Visit Type Initial  Referral From Nurse  Consult/Referral To Chaplain  Spiritual Encounters  Spiritual Needs Emotional;Prayer  Stress Factors  Patient Stress Factors Health changes  Family Stress Factors Health changes   While rounding, Nurse stated PT and family may need spiritual care. Family was in consult room. I offered spiritual care with ministry of presence. Spanish speaking family was thankful. I walked with family to PT room and offered spirit care with words of encouragement, empathic listening, and open ear and prayer. Family was very thankful for the Chaplain visit.   Chaplain Orest Dikes  [phone]

## 2018-12-03 NOTE — Plan of Care (Signed)
Patient calm, cooperative, indicated mild pain.  Family remains at bedside, supportive.  Tolerating vent.  Monitoring.

## 2018-12-03 NOTE — Progress Notes (Addendum)
NAME:  Veronica Castaneda, MRN:  [id number], DOB:  16-Jan-1961, LOS: 5 ADMISSION DATE:  11/28/2018, CONSULTATION DATE:  3/6 REFERRING MD:  isaacs, CHIEF COMPLAINT:  Acute respiratory failure    Brief History   58 yo Spanish speaking female presented with 3 days of cough, fever, chills, dyspnea.  Found to have hypoxia with interstitial infiltrates.  Required intubation.  Past Medical History  No significant medical history  Significant Hospital Events   3/6 admitted  Consults:    Procedures:  ETT 3/6>>>  Significant Diagnostic Tests:  Echo 3/07 >> EF 45 to 50%  Micro Data:  Influenza 3/6>>>Flu B Sputum 3/6>>>oral flora BCXx2 3/6>>> UC 3/6>>>negative RVP 3/6>>>Flu B Pneumococcal Ag 3/07>>>negative Legionella Ag 3/07>>>  Antimicrobials:  Zithromax 3/6>>>3/7 Rocephin 3/6>>> plan to DC 12/04/2018  Interim history/subjective:  No significant change  Objective   Blood pressure 119/78, pulse 92, temperature (!) 102.1 F (38.9 C), temperature source Oral, resp. rate 19, height 4\' 10"  (1.473 m), weight 119 kg, SpO2 91 %.    Vent Mode: PRVC FiO2 (%):  [50 %-60 %] 60 % Set Rate:  [18 bmp] 18 bmp Vt Set:  [350 mL] 350 mL PEEP:  [10 cmH20] 10 cmH20 Plateau Pressure:  [14 cmH20-26 cmH20] 18 cmH20   Intake/Output Summary (Last 24 hours) at 12/03/2018 1128 Last data filed at 12/03/2018 1000 Gross per 24 hour  Intake 1091.92 ml  Output 2680 ml  Net -1588.08 ml   Filed Weights   12/01/18 0500 12/02/18 0250 12/03/18 0500  Weight: 118.1 kg 118.1 kg 119 kg   Physical Exam:  General: Awake follows commands morbidly obese female HEENT: Endotracheal tube in place Neuro: Awake moves all extremities does not speak English CV: s1s2 rrr, no m/r/g PULM: even/non-labored, lungs bilaterally diminished throughout GQ:QPYP, non-tender, bsx4 active  Extremities: warm/dry, 1-2+ edema  Skin: no rashes or lesions   12/03/2018 support lines adequate right lower lobe opacity    Resolved Hospital Problem list     Assessment & Plan:   Acute on chronic hypoxic/hypercapnic respiratory failure from Influenza B pneumonia. Likely also has OSA and OHS. Plan -Wean per protocol.  Still remains on a PEEP of 10 and FiO2 50% -Diuresis as tolerated -Day 6 of ceftriaxone for presumed pneumonia no positive culture data plan to discontinue on day 7. -Serial chest x-rays -Repeat ABG for any type of improvement -Due to her body habitus she may need tracheostomy to be liberated from mechanical ventilatory support.    Systolic CHF >> undetermined whether acute or chronic. Plan -Repeat Lasix x3 doses on 12/03/2018 optimize for her pulmonary status -Consider cardiology consult  Hypokalemia. Recent Labs  Lab 12/02/18 0405 12/02/18 0417 12/03/18 0316  K 4.2 3.8 3.6    Intake/Output Summary (Last 24 hours) at 12/03/2018 1130 Last data filed at 12/03/2018 1000 Gross per 24 hour  Intake 1091.92 ml  Output 2680 ml  Net -1588.08 ml     Plan -Monitor as needed -Continues to be a negative I&O -Diuresis as tolerated  Acute metabolic encephalopathy. Plan -RA SS goal 0 to -1, she does follow commands no acute distress  Constipation. Plan -Continue current bowel regimen -Continue tube feeds  Best practice:  Diet: tube feeds DVT prophylaxis: [employer] heparin  GI prophylaxis: protonix Mobility: bed rest Code Status: full code  Family Communication: 12/03/2018 no family at bedside Disposition: ICU  Labs    CMP Latest Ref Rng & Units 12/03/2018 12/02/2018 12/02/2018  Glucose 70 - 99 mg/dL 950(D) - 326(Z)  BUN 6 - 20 mg/dL 33(I) - 35(W)  Creatinine 0.44 - 1.00 mg/dL 8.61 - 6.83  Sodium 729 - 145 mmol/L 146(H) 144 144  Potassium 3.5 - 5.1 mmol/L 3.6 3.8 4.2  Chloride 98 - 111 mmol/L 100 - 98  CO2 22 - 32 mmol/L 35(H) - 35(H)  Calcium 8.9 - 10.3 mg/dL 9.1 - 9.2  Total Protein 6.5 - 8.1 g/dL - - -  Total Bilirubin 0.3 - 1.2 mg/dL - - -  Alkaline Phos 38 - 126 U/L - - -   AST 15 - 41 U/L - - -  ALT 0 - 44 U/L - - -   CBC Latest Ref Rng & Units 12/03/2018 12/02/2018 12/02/2018  WBC 4.0 - 10.5 K/uL 9.4 - 8.9  Hemoglobin 12.0 - 15.0 g/dL 02.1 15.6(H) 14.9  Hematocrit 36.0 - 46.0 % 49.3(H) 46.0 49.6(H)  Platelets 150 - 400 K/uL 181 - 175   ABG    Component Value Date/Time   PHART 7.419 12/02/2018 0417   PCO2ART 64.2 (H) 12/02/2018 0417   PO2ART 61.0 (L) 12/02/2018 0417   HCO3 41.5 (H) 12/02/2018 0417   TCO2 43 (H) 12/02/2018 0417   O2SAT 90.0 12/02/2018 0417   CBG (last 3)  Recent Labs    12/03/18 0357 12/03/18 0816 12/03/18 1125  GLUCAP 129* 159* 142*   App cct 34 min  Brett Canales Minor ACNP [facility] PCCM Pager [phone] till 1 pm If no answer page [phone] 12/03/2018, 11:28 AM  Attending Note:  58 year old female with VDRF due to PNA and Flu B.  Patient is not weaning well this AM but PEEP remains elevated with coarse BS diffusely.  I reviewed CXR myself, ETT is in a good position.  Discussed with PCCM-NP.  Spoke with family via Nurse, learning disability.  Discussing plan of care post extubation.  Hold off weaning today.  Continue diureses as able.  Continue abx.  PCCM will continue to follow.  The patient is critically ill with multiple organ systems failure and requires high complexity decision making for assessment and support, frequent evaluation and titration of therapies, application of advanced monitoring technologies and extensive interpretation of multiple databases.   Critical Care Time devoted to patient care services described in this note is  40  Minutes. This time reflects time of care of this signee Dr Koren Bound. This critical care time does not reflect procedure time, or teaching time or supervisory time of PA/NP/Med student/Med Resident etc but could involve care discussion time.  Alyson Reedy, M.D. [facility] Pulmonary/Critical Care Medicine. Pager: [phone]. After hours pager: [phone].

## 2018-12-04 ENCOUNTER — Inpatient Hospital Stay (HOSPITAL_COMMUNITY): Payer: Self-pay

## 2018-12-04 DIAGNOSIS — J80 Acute respiratory distress syndrome: Secondary | ICD-10-CM

## 2018-12-04 LAB — BASIC METABOLIC PANEL
Anion gap: 12 (ref 5–15)
Anion gap: 11 (ref 5–15)
BUN: 35 mg/dL — ABNORMAL HIGH (ref 6–20)
BUN: 39 mg/dL — ABNORMAL HIGH (ref 6–20)
CHLORIDE: 107 mmol/L (ref 98–111)
CO2: 34 mmol/L — ABNORMAL HIGH (ref 22–32)
CO2: 35 mmol/L — ABNORMAL HIGH (ref 22–32)
Calcium: 9.2 mg/dL (ref 8.9–10.3)
Calcium: 9.1 mg/dL (ref 8.9–10.3)
Chloride: 104 mmol/L (ref 98–111)
Creatinine, Ser: 0.9 mg/dL (ref 0.44–1.00)
Creatinine, Ser: 0.85 mg/dL (ref 0.44–1.00)
GFR calc Af Amer: >60 mL/min (ref >60)
GFR calc Af Amer: >60 mL/min (ref >60)
GFR calc non Af Amer: >60 mL/min (ref >60)
GLUCOSE: 147 mg/dL — AB (ref 70–99)
Glucose, Bld: 164 mg/dL — ABNORMAL HIGH (ref 70–99)
Potassium: 3.2 mmol/L — ABNORMAL LOW (ref 3.5–5.1)
Potassium: 3.3 mmol/L — ABNORMAL LOW (ref 3.5–5.1)
Sodium: 150 mmol/L — ABNORMAL HIGH (ref 135–145)
Sodium: 153 mmol/L — ABNORMAL HIGH (ref 135–145)

## 2018-12-04 LAB — GLUCOSE, CAPILLARY
Glucose-Capillary: 128 mg/dL — ABNORMAL HIGH (ref 70–99)
Glucose-Capillary: 142 mg/dL — ABNORMAL HIGH (ref 70–99)
Glucose-Capillary: 149 mg/dL — ABNORMAL HIGH (ref 70–99)
Glucose-Capillary: 152 mg/dL — ABNORMAL HIGH (ref 70–99)
Glucose-Capillary: 158 mg/dL — ABNORMAL HIGH (ref 70–99)
Glucose-Capillary: 168 mg/dL — ABNORMAL HIGH (ref 70–99)

## 2018-12-04 LAB — PHOSPHORUS: Phosphorus: 3.9 mg/dL (ref 2.5–4.6)

## 2018-12-04 LAB — MAGNESIUM: MAGNESIUM: 2.4 mg/dL (ref 1.7–2.4)

## 2018-12-04 MED ORDER — FREE WATER
150.0000 mL | Freq: Three times a day (TID) | Status: DC
  Administered 2018-12-04 – 2018-12-08 (×11): 150 mL

## 2018-12-04 MED ORDER — POTASSIUM CHLORIDE 20 MEQ/15ML (10%) PO SOLN
30.0000 meq | ORAL | Status: AC
  Administered 2018-12-04: 30 meq
  Filled 2018-12-04: qty 30

## 2018-12-04 MED ORDER — BISACODYL 10 MG RE SUPP
10.0000 mg | Freq: Every day | RECTAL | Status: DC | PRN
  Administered 2018-12-04: 10 mg via RECTAL
  Filled 2018-12-04: qty 1

## 2018-12-04 MED ORDER — SENNOSIDES 8.8 MG/5ML PO SYRP
5.0000 mL | ORAL_SOLUTION | Freq: Two times a day (BID) | ORAL | Status: DC
  Administered 2018-12-04 – 2018-12-14 (×13): 5 mL
  Filled 2018-12-04 (×17): qty 5

## 2018-12-04 MED ORDER — FREE WATER: 100.0000 mL | Freq: Three times a day (TID) | Status: DC

## 2018-12-04 MED ORDER — FUROSEMIDE 10 MG/ML IJ SOLN
40.0000 mg | Freq: Once | INTRAMUSCULAR | Status: AC
  Administered 2018-12-04: 40 mg via INTRAVENOUS
  Filled 2018-12-04: qty 4

## 2018-12-04 NOTE — Progress Notes (Addendum)
NAME:  Veronica Castaneda, MRN:  [id number], DOB:  [birth year], LOS: 6 ADMISSION DATE:  11/28/2018, CONSULTATION DATE:  3/6 REFERRING MD:  isaacs, CHIEF COMPLAINT:  Acute respiratory failure    Brief History   58 yo Spanish speaking female presented with 3 days of cough, fever, chills, dyspnea.  Found to have hypoxia with interstitial infiltrates.  Required intubation.  Past Medical History  No significant medical history  Significant Hospital Events   3/6 admitted 3/12 increasing vent requirement   Consults:    Procedures:  ETT 3/6>>>  Significant Diagnostic Tests:  Echo 3/07 >> EF 45 to 50%  Micro Data:  Influenza 3/6>>>Flu B Sputum 3/6>>>oral flora BCXx2 3/6>>>  UC 3/6>>>negative RVP 3/6>>>Flu B Pneumococcal Ag 3/07>>>negative Legionella Ag 3/07>>>  Antimicrobials:  Zithromax 3/6>>>3/7 Rocephin 3/6> to end 3/13   Interim history/subjective:  Increased vent requirement overnight to 90% 10 PEEP  Objective   Blood pressure 137/84, pulse 87, temperature 99.5 F (37.5 C), temperature source Oral, resp. rate 18, height 4\' 10"  (1.473 m), weight 111.9 kg, SpO2 90 %.    Vent Mode: PRVC FiO2 (%):  [60 %-90 %] 80 % Set Rate:  [18 bmp] 18 bmp Vt Set:  [350 mL] 350 mL PEEP:  [10 cmH20-12 cmH20] 12 cmH20 Plateau Pressure:  [19 cmH20-23 cmH20] 23 cmH20   Intake/Output Summary (Last 24 hours) at 12/04/2018 1153 Last data filed at 12/04/2018 1100 Gross per 24 hour  Intake 1838.74 ml  Output 3190 ml  Net -1351.26 ml   Filed Weights   12/02/18 0250 12/03/18 0500 12/04/18 0500  Weight: 118.1 kg 119 kg 111.9 kg   Physical Exam: General: Awake, morbidly obese adult female, intubated, NAD HEENT: NCAT ETT secure pink mmm Neuro: Awake  Alert, following commands PERRL CV: RRR s1s2 no r/g/m PULM: Diminished lung sound with bilateral rhonchi  GI: obest, soft, ndnt, hypoactive x4 Extremities: symmetrical bulk and tone, no obvious deformity  Skin: clean dry warm intact    Resolved Hospital Problem list     Assessment & Plan:   Acute on chronic hypoxic/hypercapnic respiratory failure from Influenza B pneumonia. ?PNA- respiratory culture more indicative of normal flora  Likely also has OSA and OHS. Plan -Wean as able, increased vent requirement today to 90%  10 PEEP, weaned to 80% FiO2 now -Titrate FiO2/PEEP for SpO2 > 94 -Diuresis as tolerated -Ceftriaxone for possible PNA, to end after 7th day  -AM CXR -Tracheostomy topic approached yesterday when patient's vent requirements were less. In light of increased FiO2 and PEEP requirements today, it is not safe at this time to consider tracheostomy. Continue mechanical ventilatory support via ETT, wean as able. Will revisit tracheostomy as possibility if able to wean in future   Systolic CHF >> undetermined whether acute or chronic. Plan -Lasix today  -Trend BMP -Consider cardiology consult  Electrolyte abnormality Hypokalemia, Hypernatremia Plan -Monitor BMP -Replace lytes PRN -FWF q8 for hypernatremia -Recheck BMP this evening (3/12) to assess K and Na. -AM BMP   Constipation Plan  -Increased bowel regimen: BID colace and senokot. PRN fleet and dulcolax  -Continue tube feeds  Goals of Care: 12/04/18 Family discussion occurred this morning with myself and Dr. Molli Knock with the assistance of a medical interpreter, as witnessed by the patient's bedside RN -It was discussed that the patient's ventilatory requirements have increased since yesterday, with increased PEEP and FiO2 requirements  -It was discussed that in the setting of worsening pulmonary function, performing a tracheostomy as previously discussed is  not a safe option. The patient's vent requirements are too high. We will continue to support the patient with MV and wean as tolerated, and if appropriate will revisit the possibility of tracheostomy in the future   -Code status was discussed. After discussion with Dr. Molli Knock and the  family, a decision was made that the patient would be a LIMITED CODE  -In the event of arrest, the patient will have NO CPR NO CARDIOVERSION    Best practice:  Diet: Enteral nutrition DVT prophylaxis: SQ Heparin GI prophylaxis: protonix Mobility: bed rest Code Status: Limited: no CPR, No Cardioversion Family Communication: Family updated in conference room  Disposition: Continue ICU level of care   Labs    CMP Latest Ref Rng & Units 12/04/2018 12/03/2018 12/02/2018  Glucose 70 - 99 mg/dL 130(Q) 657(Q) -  BUN 6 - 20 mg/dL 46(N) 62(X) -  Creatinine 0.44 - 1.00 mg/dL 5.28 4.13 -  Sodium 244 - 145 mmol/L 150(H) 146(H) 144  Potassium 3.5 - 5.1 mmol/L 3.2(L) 3.6 3.8  Chloride 98 - 111 mmol/L 104 100 -  CO2 22 - 32 mmol/L 34(H) 35(H) -  Calcium 8.9 - 10.3 mg/dL 9.2 9.1 -  Total Protein 6.5 - 8.1 g/dL - - -  Total Bilirubin 0.3 - 1.2 mg/dL - - -  Alkaline Phos 38 - 126 U/L - - -  AST 15 - 41 U/L - - -  ALT 0 - 44 U/L - - -   CBC Latest Ref Rng & Units 12/03/2018 12/02/2018 12/02/2018  WBC 4.0 - 10.5 K/uL 9.4 - 8.9  Hemoglobin 12.0 - 15.0 g/dL 01.0 15.6(H) 14.9  Hematocrit 36.0 - 46.0 % 49.3(H) 46.0 49.6(H)  Platelets 150 - 400 K/uL 181 - 175   ABG    Component Value Date/Time   PHART 7.419 12/02/2018 0417   PCO2ART 64.2 (H) 12/02/2018 0417   PO2ART 61.0 (L) 12/02/2018 0417   HCO3 41.5 (H) 12/02/2018 0417   TCO2 43 (H) 12/02/2018 0417   O2SAT 90.0 12/02/2018 0417   CBG (last 3)  Recent Labs    12/03/18 2328 12/04/18 0403 12/04/18 0732  GLUCAP 140* 152* 168*   Critical Care Time: 65 minutes Tessie Fass MSN, AGACNP-BC [place] Pulmonary/Critical Care Medicine 12/04/2018, 11:53 AM  Attending Note:  58 year old female with Flu and subsequent ARDS.  Overnight, the patient decompensated and is now requiring high PEEP and FiO2.  On exam, coarse BS diffusely.  I reviewed CXR myself, ETT is in a good position.  Discussed with PCCM-NP.  Spoke with family at length through a  Nurse, learning disability.  Informed them of overnight deterioration and our attempt to address.  Removed tracheostomy off the table at this time.  After a long discussion, decision was made to proceed with LCB with no CPR/cardioversion but continue aggressive care otherwise.  Will increase PEEP and FiO2 to address hypoxemia.  Will change code status.  PCCM will continue to manage.  The patient is critically ill with multiple organ systems failure and requires high complexity decision making for assessment and support, frequent evaluation and titration of therapies, application of advanced monitoring technologies and extensive interpretation of multiple databases.   Critical Care Time devoted to patient care services described in this note is  33  Minutes. This time reflects time of care of this signee Dr Koren Bound. This critical care time does not reflect procedure time, or teaching time or supervisory time of PA/NP/Med student/Med Resident etc but could involve care discussion time.  Rush Farmer, M.D. [facility] Pulmonary/Critical Care Medicine. Pager: [phone]. After hours pager: [phone].

## 2018-12-05 ENCOUNTER — Inpatient Hospital Stay (HOSPITAL_COMMUNITY): Payer: Self-pay

## 2018-12-05 DIAGNOSIS — E8770 Fluid overload, unspecified: Secondary | ICD-10-CM

## 2018-12-05 LAB — POCT I-STAT 7, (LYTES, BLD GAS, ICA,H+H)
Acid-Base Excess: 13 mmol/L — ABNORMAL HIGH (ref 0.0–2.0)
Bicarbonate: 41.5 mmol/L — ABNORMAL HIGH (ref 20.0–28.0)
Calcium, Ion: 1.18 mmol/L (ref 1.15–1.40)
HCT: 48 % — ABNORMAL HIGH (ref 36.0–46.0)
Hemoglobin: 16.3 g/dL — ABNORMAL HIGH (ref 12.0–15.0)
O2 Saturation: 89 %
Patient temperature: 101.4
Potassium: 3.3 mmol/L — ABNORMAL LOW (ref 3.5–5.1)
Sodium: 154 mmol/L — ABNORMAL HIGH (ref 135–145)
TCO2: 43 mmol/L — ABNORMAL HIGH (ref 22–32)
pCO2 arterial: 69.2 mmHg (ref 32.0–48.0)
pH, Arterial: 7.393 (ref 7.350–7.450)
pO2, Arterial: 65 mmHg — ABNORMAL LOW (ref 83.0–108.0)

## 2018-12-05 LAB — BASIC METABOLIC PANEL
ANION GAP: 12 (ref 5–15)
BUN: 41 mg/dL — ABNORMAL HIGH (ref 6–20)
CHLORIDE: 106 mmol/L (ref 98–111)
CO2: 34 mmol/L — ABNORMAL HIGH (ref 22–32)
Calcium: 9.2 mg/dL (ref 8.9–10.3)
Creatinine, Ser: 0.83 mg/dL (ref 0.44–1.00)
GFR calc Af Amer: >60 mL/min (ref >60)
GFR calc non Af Amer: >60 mL/min (ref >60)
Glucose, Bld: 139 mg/dL — ABNORMAL HIGH (ref 70–99)
POTASSIUM: 3.4 mmol/L — AB (ref 3.5–5.1)
Sodium: 152 mmol/L — ABNORMAL HIGH (ref 135–145)

## 2018-12-05 LAB — GLUCOSE, CAPILLARY
GLUCOSE-CAPILLARY: 143 mg/dL — AB (ref 70–99)
GLUCOSE-CAPILLARY: 151 mg/dL — AB (ref 70–99)
Glucose-Capillary: 139 mg/dL — ABNORMAL HIGH (ref 70–99)
Glucose-Capillary: 164 mg/dL — ABNORMAL HIGH (ref 70–99)
Glucose-Capillary: 174 mg/dL — ABNORMAL HIGH (ref 70–99)

## 2018-12-05 LAB — CBC
HEMATOCRIT: 51 % — AB (ref 36.0–46.0)
Hemoglobin: 14.8 g/dL (ref 12.0–15.0)
MCH: 29.1 pg (ref 26.0–34.0)
MCHC: 29 g/dL — ABNORMAL LOW (ref 30.0–36.0)
MCV: 100.4 fL — ABNORMAL HIGH (ref 80.0–100.0)
Platelets: 206 10*3/uL (ref 150–400)
RBC: 5.08 MIL/uL (ref 3.87–5.11)
RDW: 14.6 % (ref 11.5–15.5)
WBC: 12.6 10*3/uL — ABNORMAL HIGH (ref 4.0–10.5)
nRBC: 0 % (ref 0.0–0.2)

## 2018-12-05 LAB — MAGNESIUM
Magnesium: 2.7 mg/dL — ABNORMAL HIGH (ref 1.7–2.4)
Magnesium: 2.6 mg/dL — ABNORMAL HIGH (ref 1.7–2.4)

## 2018-12-05 LAB — PHOSPHORUS
Phosphorus: 4 mg/dL (ref 2.5–4.6)
Phosphorus: 2.5 mg/dL (ref 2.5–4.6)

## 2018-12-05 MED ORDER — VITAL HIGH PROTEIN PO LIQD: 1000.0000 mL | ORAL | Status: DC

## 2018-12-05 MED ORDER — SORBITOL 70 % SOLN
960.0000 mL | TOPICAL_OIL | Freq: Once | ORAL | Status: AC
  Administered 2018-12-05: 960 mL via RECTAL
  Filled 2018-12-05: qty 473

## 2018-12-05 MED ORDER — POTASSIUM CHLORIDE 10 MEQ/50ML IV SOLN
10.0000 meq | INTRAVENOUS | Status: DC
  Filled 2018-12-05 (×3): qty 50

## 2018-12-05 MED ORDER — POTASSIUM CHLORIDE 10 MEQ/100ML IV SOLN
10.0000 meq | INTRAVENOUS | Status: AC
  Administered 2018-12-05 (×4): 10 meq via INTRAVENOUS
  Filled 2018-12-05 (×4): qty 100

## 2018-12-05 MED ORDER — METOLAZONE 5 MG PO TABS
5.0000 mg | ORAL_TABLET | Freq: Every day | ORAL | Status: AC
  Administered 2018-12-05: 5 mg via ORAL
  Filled 2018-12-05: qty 1

## 2018-12-05 MED ORDER — MAGNESIUM HYDROXIDE 400 MG/5ML PO SUSP
30.0000 mL | Freq: Every day | ORAL | Status: AC
  Administered 2018-12-05 – 2018-12-07 (×3): 30 mL
  Filled 2018-12-05 (×3): qty 30

## 2018-12-05 MED ORDER — FUROSEMIDE 10 MG/ML IJ SOLN
40.0000 mg | Freq: Four times a day (QID) | INTRAMUSCULAR | Status: AC
  Administered 2018-12-05 (×3): 40 mg via INTRAVENOUS
  Filled 2018-12-05 (×3): qty 4

## 2018-12-05 MED ORDER — POTASSIUM CHLORIDE 20 MEQ/15ML (10%) PO SOLN
40.0000 meq | Freq: Three times a day (TID) | ORAL | Status: AC
  Administered 2018-12-05 (×2): 40 meq
  Filled 2018-12-05 (×2): qty 30

## 2018-12-05 MED ORDER — VITAL HIGH PROTEIN PO LIQD
1000.0000 mL | ORAL | Status: DC
  Administered 2018-12-05 – 2018-12-10 (×5): 1000 mL

## 2018-12-05 NOTE — Progress Notes (Signed)
Nutrition Follow-up  DOCUMENTATION CODES:   Morbid obesity  INTERVENTION:   Tube Feeding:  Vital High Protein at 60 ml/hr D/C Pro-Stat  Provides 1440 kcals, 127 g of protein and 1210 mL of free water Meets 100% of calorie and protein needs   NUTRITION DIAGNOSIS:   Inadequate oral intake related to inability to eat as evidenced by NPO status.  Being addressed via TF   GOAL:   Provide needs based on ASPEN/SCCM guidelines  Met  MONITOR:   TF tolerance, Labs, Skin, Vent status, Weight trends, I & O's  REASON FOR ASSESSMENT:   Ventilator, Consult Enteral/tube feeding initiation and management  ASSESSMENT:   58 yo female presented with of cough, fever, chills, dyspnea.  Found to have hypoxia with interstitial infiltrates requiring intubation. Pt with acute on chronic hypoxic/hypercapnic respiratory failure from Influenza B pneumonia.  Patient is currently intubated on ventilator support MV: 6.9 L/min Temp (24hrs), Avg:101.4 F (38.6 C), Min:100.7 F (38.2 C), Max:102.4 F (39.1 C)  Tolerating Vital high Protein at 40 ml/hr, Pro-Stat 30 mL BID Free water 150 mL q 8 hours  Constipated with no BM since 3/5; pt on bowel regimen with meds per tube and enemas  Weight trending down since admission; current wt 112.9 kg; admission wt 122.5 kg. Lowest weight 111.9 kg. Net - 7L per I/O flow sheet  Labs: sodium 152 (H), potassium 3.4 Meds: lasix, MVI, enema, senokot, colace  Diet Order:   Diet Order            Diet NPO time specified  Diet effective now              EDUCATION NEEDS:   Not appropriate for education at this time  Skin:  Skin Assessment: Skin Integrity Issues:(no pressure injuries noted)  Last BM:  3/5  Height:   Ht Readings from Last 1 Encounters:  11/28/18 4' 10"  (1.473 m)    Weight:   Wt Readings from Last 1 Encounters:  12/05/18 112.9 kg    Ideal Body Weight:  43.76 kg  BMI:  Body mass index is 52.02 kg/m.  Estimated  Nutritional Needs:   Kcal:  1245-1450 kcals   Protein:  88-109 g   Fluid:  >/= 1.5 L/day   Kerman Passey MS, RD, LDN, CNSC [phone] Pager  [phone] Weekend/On-Call Pager

## 2018-12-05 NOTE — Progress Notes (Signed)
Pt was turned to Lt side at 0730 and assessed - went back in room for meds and pt was on her back - family had taken out all the pillows from her back - explained to family & patient that it is not good to lay on her back all the time and why we turn her to help prevent skin breakdown and mobilize secretions

## 2018-12-05 NOTE — Progress Notes (Signed)
NAME:  [id number]<MEASUREMEN t will have NO CPR NO CARDIOVERSION   Family updated via translator  Best practice:  Diet: Enteral nutrition DVT prophylaxis: SQ Heparin GI prophylaxis: protonix Mobility: bed rest Code Status: Limited: no CPR, No Cardioversion Family Communication: Family updated in conference room  Disposition: Continue ICU level of care   Labs    CMP Latest Ref Rng & Units 12/05/2018 12/05/2018 12/04/2018  Glucose 70 - 99 mg/dL 139(H) - 147(H)  BUN 6 - 20 mg/dL 41(H) - 39(H)  Creatinine 0.44 - 1.00 mg/dL 0.83 - 0.85  Sodium 135 - [phone] [place] HTen[phone] LakeyTen913 [phone] HelpSelect Specialty Hospital Central PennsylTen22[phone] HelpMemorial Hospital At Gulfporter53(H)  Potassium 3.5 - 5.1 mmol/L 3.4(L) 3.3(L) 3.3(L)  Chloride 98 - 111 mmol/L 106 - 107  CO2 22 - 32 mmol/L 34(H) - 35(H)  Calcium 8.9 - 10.3 mg/dL 9.2 - 9.1  Total Protein 6.5 - 8.1 g/dL - - -  Total Bilirubin 0.3 - 1.2 mg/dL - - -  Alkaline Phos 38 - 126 U/L - - -  AST 15 - 41 U/L - - -  ALT 0 - 44 U/L - - -   CBC Latest Ref Rng & Units 12/05/2018 12/05/2018 12/03/2018  WBC 4.0 - 10.5 K/uL 12.6(H) - 9.4  Hemoglobin 12.0 - 15.0 g/dL 09.9 16.3(H) 14.7  Hematocrit 36.0 - 46.0 % 51.0(H) 48.0(H) 49.3(H)  Platelets 150 -  400 K/uL 206 - 181   ABG    Component Value Date/Time   PHART 7.393 12/05/2018 0349   PCO2ART 69.2 (HH) 12/05/2018 0349   PO2ART 65.0 (L) 12/05/2018 0349   HCO3 41.5 (H) 12/05/2018 0349   TCO2 43 (H) 12/05/2018 0349   O2SAT 89.0 12/05/2018 0349   CBG (last 3)  Recent Labs    12/04/18 2335 12/05/18 0352 12/05/18 0804  GLUCAP 128* 139* 143*   The patient is critically ill with multiple organ systems failure and requires high complexity decision making for assessment and support, frequent evaluation and titration of therapies, application of advanced monitoring technologies and extensive interpretation of multiple databases.   Critical Care Time devoted to patient care services described in this note is  34  Minutes. This time reflects time of care of this signee Dr Koren Bound. This critical care time does not reflect procedure time, or teaching time or supervisory time of PA/NP/Med student/Med Resident etc but could involve care discussion time.  Alyson Reedy, M.D. [facility] Pulmonary/Critical Care Medicine. Pager: [phone]. After hours pager: [phone].

## 2018-12-05 NOTE — Progress Notes (Signed)
Elink notified of morning ABG 3/13. PO2 65% on 12 of peep and 80% FiO2 Pt O2 saturation 87-89%.    Temperature 102.2, no decrease throughout night with tylenol or ice packs. Cooling blanket ordered.

## 2018-12-06 ENCOUNTER — Inpatient Hospital Stay (HOSPITAL_COMMUNITY): Payer: Self-pay

## 2018-12-06 ENCOUNTER — Encounter (HOSPITAL_COMMUNITY): Payer: Self-pay | Admitting: *Deleted

## 2018-12-06 DIAGNOSIS — E876 Hypokalemia: Secondary | ICD-10-CM

## 2018-12-06 LAB — GLUCOSE, CAPILLARY
GLUCOSE-CAPILLARY: 153 mg/dL — AB (ref 70–99)
Glucose-Capillary: 159 mg/dL — ABNORMAL HIGH (ref 70–99)
Glucose-Capillary: 164 mg/dL — ABNORMAL HIGH (ref 70–99)
Glucose-Capillary: 171 mg/dL — ABNORMAL HIGH (ref 70–99)
Glucose-Capillary: 173 mg/dL — ABNORMAL HIGH (ref 70–99)
Glucose-Capillary: 181 mg/dL — ABNORMAL HIGH (ref 70–99)

## 2018-12-06 LAB — POCT I-STAT 7, (LYTES, BLD GAS, ICA,H+H)
Acid-Base Excess: 16 mmol/L — ABNORMAL HIGH (ref 0.0–2.0)
Bicarbonate: 44 mmol/L — ABNORMAL HIGH (ref 20.0–28.0)
Calcium, Ion: 1.19 mmol/L (ref 1.15–1.40)
HCT: 49 % — ABNORMAL HIGH (ref 36.0–46.0)
Hemoglobin: 16.7 g/dL — ABNORMAL HIGH (ref 12.0–15.0)
O2 Saturation: 96 %
Patient temperature: 101.2
Potassium: 3.6 mmol/L (ref 3.5–5.1)
Sodium: 154 mmol/L — ABNORMAL HIGH (ref 135–145)
TCO2: 46 mmol/L — ABNORMAL HIGH (ref 22–32)
pCO2 arterial: 65.2 mmHg (ref 32.0–48.0)
pH, Arterial: 7.442 (ref 7.350–7.450)
pO2, Arterial: 86 mmHg (ref 83.0–108.0)

## 2018-12-06 LAB — BASIC METABOLIC PANEL
Anion gap: 12 (ref 5–15)
BUN: 48 mg/dL — ABNORMAL HIGH (ref 6–20)
CALCIUM: 9.6 mg/dL (ref 8.9–10.3)
CO2: 37 mmol/L — ABNORMAL HIGH (ref 22–32)
Chloride: 105 mmol/L (ref 98–111)
Creatinine, Ser: 1.01 mg/dL — ABNORMAL HIGH (ref 0.44–1.00)
GFR calc Af Amer: >60 mL/min (ref >60)
GFR calc non Af Amer: >60 mL/min (ref >60)
Glucose, Bld: 170 mg/dL — ABNORMAL HIGH (ref 70–99)
Potassium: 3.3 mmol/L — ABNORMAL LOW (ref 3.5–5.1)
SODIUM: 154 mmol/L — AB (ref 135–145)

## 2018-12-06 LAB — CBC
HCT: 52.5 % — ABNORMAL HIGH (ref 36.0–46.0)
Hemoglobin: 15.6 g/dL — ABNORMAL HIGH (ref 12.0–15.0)
MCH: 29.7 pg (ref 26.0–34.0)
MCHC: 29.7 g/dL — ABNORMAL LOW (ref 30.0–36.0)
MCV: 100 fL (ref 80.0–100.0)
NRBC: 0 % (ref 0.0–0.2)
Platelets: 227 10*3/uL (ref 150–400)
RBC: 5.25 MIL/uL — ABNORMAL HIGH (ref 3.87–5.11)
RDW: 14.4 % (ref 11.5–15.5)
WBC: 12.9 10*3/uL — ABNORMAL HIGH (ref 4.0–10.5)

## 2018-12-06 LAB — PHOSPHORUS
Phosphorus: 2.9 mg/dL (ref 2.5–4.6)
Phosphorus: 2.4 mg/dL — ABNORMAL LOW (ref 2.5–4.6)

## 2018-12-06 LAB — MAGNESIUM
Magnesium: 2.7 mg/dL — ABNORMAL HIGH (ref 1.7–2.4)
Magnesium: 3.1 mg/dL — ABNORMAL HIGH (ref 1.7–2.4)

## 2018-12-06 MED ORDER — POTASSIUM CHLORIDE 20 MEQ/15ML (10%) PO SOLN
40.0000 meq | Freq: Three times a day (TID) | ORAL | Status: AC
  Administered 2018-12-06 (×2): 40 meq
  Filled 2018-12-06 (×2): qty 30

## 2018-12-06 MED ORDER — METOLAZONE 5 MG PO TABS
5.0000 mg | ORAL_TABLET | Freq: Every day | ORAL | Status: AC
  Administered 2018-12-06: 5 mg via ORAL
  Filled 2018-12-06: qty 1

## 2018-12-06 MED ORDER — ACETAZOLAMIDE SODIUM 500 MG IJ SOLR
250.0000 mg | Freq: Four times a day (QID) | INTRAMUSCULAR | Status: AC
  Administered 2018-12-06 – 2018-12-07 (×3): 250 mg via INTRAVENOUS
  Filled 2018-12-06 (×3): qty 250

## 2018-12-06 MED ORDER — FENTANYL CITRATE (PF) 100 MCG/2ML IJ SOLN
25.0000 ug | INTRAMUSCULAR | Status: DC | PRN
  Administered 2018-12-07 – 2018-12-10 (×6): 25 ug via INTRAVENOUS
  Filled 2018-12-06 (×7): qty 2

## 2018-12-06 MED ORDER — POTASSIUM CHLORIDE 20 MEQ/15ML (10%) PO SOLN
20.0000 meq | ORAL | Status: AC
  Administered 2018-12-06: 20 meq
  Filled 2018-12-06: qty 15

## 2018-12-06 MED ORDER — FUROSEMIDE 10 MG/ML IJ SOLN
40.0000 mg | Freq: Four times a day (QID) | INTRAMUSCULAR | Status: AC
  Administered 2018-12-06 – 2018-12-07 (×3): 40 mg via INTRAVENOUS
  Filled 2018-12-06 (×3): qty 4

## 2018-12-06 MED ORDER — POTASSIUM CHLORIDE 10 MEQ/50ML IV SOLN
10.0000 meq | INTRAVENOUS | Status: DC
  Filled 2018-12-06: qty 50

## 2018-12-06 MED ORDER — POTASSIUM CHLORIDE 10 MEQ/100ML IV SOLN
10.0000 meq | INTRAVENOUS | Status: AC
  Administered 2018-12-06 (×4): 10 meq via INTRAVENOUS
  Filled 2018-12-06: qty 100

## 2018-12-06 NOTE — Progress Notes (Signed)
eLink Physician-Brief Progress Note [name] DOB: 04/05/1961 MRN: [id number]   Date of Service  12/06/2018  HPI/Events of Note  Pt intubated, coughing while on the vent.  No prn pain meds ordered.   eICU Interventions  Fentanyl ordered.     Intervention Category Minor Interventions: Other:  Larinda Buttery 12/06/2018, 5:53 AM

## 2018-12-06 NOTE — Progress Notes (Signed)
[facility] ADULT ICU REPLACEMENT PROTOCOL FOR AM LAB REPLACEMENT ONLY  The patient does apply for the [facility] Adult ICU Electrolyte Replacment Protocol based on the criteria listed below:   1. Is GFR >/= 40 ml/min? Yes.    Patient's GFR today is >60 2. Is urine output >/= 0.5 ml/kg/hr for the last 6 hours? Yes.   Patient's UOP is .7 ml/kg/hr 3. Is BUN < 60 mg/dL? Yes.    Patient's BUN today is 48 4. Abnormal electrolyte(s):  K-3.35. Ordered repletion with: per protocol 6. If a panic level lab has been reported, has the CCM MD in charge been notified? Yes.  .   Physician:  Dr. Marc Morgans, [place] Boos 12/06/2018 6:05 AM

## 2018-12-06 NOTE — Progress Notes (Signed)
NAME:  Veronica Castaneda, MRN:  [id number], DOB:  1961/07/29, LOS: 8 ADMISSION DATE:  11/28/2018, CONSULTATION DATE:  3/6 REFERRING MD:  isaacs, CHIEF COMPLAINT:  Acute respiratory failure    Brief History   58 yo Spanish speaking female presented with 3 days of cough, fever, chills, dyspnea.  Found to have hypoxia with interstitial infiltrates.  Required intubation.  Past Medical History  No significant medical history  Significant Hospital Events   3/6 admitted 3/12 increasing vent requirement   Consults:    Procedures:  ETT 3/6>>>  Significant Diagnostic Tests:  Echo 3/07 >> EF 45 to 50%  Micro Data:  Influenza 3/6>>>Flu B Sputum 3/6>>>oral flora BCXx2 3/6>>>  UC 3/6>>>negative RVP 3/6>>>Flu B Pneumococcal Ag 3/07>>>negative Legionella Ag 3/07>>>  Antimicrobials:  Zithromax 3/6>>>3/7 Rocephin 3/6> to end 3/13   Interim history/subjective:  Intermittent desaturation overnight  Objective   Blood pressure 140/77, pulse 76, temperature (!) 100.7 F (38.2 C), temperature source Core, resp. rate (!) 22, height 4\' 10"  (1.473 m), weight 112.6 kg, SpO2 93 %.    Vent Mode: PRVC FiO2 (%):  [90 %-100 %] 94 % Set Rate:  [18 bmp] 18 bmp Vt Set:  [320 mL] 320 mL PEEP:  [18 cmH20] 18 cmH20 Plateau Pressure:  [26 cmH20-28 cmH20] 26 cmH20   Intake/Output Summary (Last 24 hours) at 12/06/2018 1110 Last data filed at 12/06/2018 0900 Gross per 24 hour  Intake 1983.87 ml  Output 3035 ml  Net -1051.13 ml   Filed Weights   12/04/18 0500 12/05/18 0500 12/06/18 0345  Weight: 111.9 kg 112.9 kg 112.6 kg   Physical Exam: General: Acutely ill appearing female, resting comfortably HEENT: /AT, PERRL, EOM-I and MMM Neuro: Awake and interactive, moving all ext to command CV: RRR, Nl S1/S2 and -M/R/G PULM: Diffuse crackles GI: Soft, NT, ND and +BS Extremities: Diffuse edema Skin: clean dry warm intact  Resolved Hospital Problem list     Assessment & Plan:   Acute  on chronic hypoxic/hypercapnic respiratory failure from Influenza B pneumonia. ?PNA- respiratory culture more indicative of normal flora  Likely also has OSA and OHS. Plan - Full vent support - Decrease to 80% and 16 - Continue active diureses - Rocephin course complete - CXR and ABG in AM - ARDS protocol  Systolic CHF >> undetermined whether acute or chronic. Plan - Lasix today as below - Trend BMP  Electrolyte abnormality Hypokalemia, Hypernatremia Plan - Monitor BMP - Replace lytes PRN - FWF q8 for hypernatremia - BMET in AM - Lasix 40 mg IV q6 x3 doses - Zaroxolyn 5 mg PO x1 - Diamox 250 q8 x2 doses - Potassium replacement  Constipation Plan  - Increased bowel regimen: BID colace and senokot. PRN fleet and dulcolax  - Continue tube feeds - Add MOM - SMOG enema x1  -In the event of arrest, the patient will have NO CPR NO CARDIOVERSION   No family bedside to update  Best practice:  Diet: Enteral nutrition DVT prophylaxis: SQ Heparin GI prophylaxis: protonix Mobility: bed rest Code Status: Limited: no CPR, No Cardioversion Family Communication: Family updated in conference room  Disposition: Continue ICU level of care   Labs    CMP Latest Ref Rng & Units 12/06/2018 12/06/2018 12/05/2018  Glucose 70 - 99 mg/dL 660(Y) - 301(S)  BUN 6 - 20 mg/dL 01(U) - 93(A)  Creatinine 0.44 - 1.00 mg/dL 3.55(D) - 3.22  Sodium 135 - 145 mmol/L 154(H) 154(H) 152(H)  Potassium 3.5 - 5.1  mmol/L 3.3(L) 3.6 3.4(L)  Chloride 98 - 111 mmol/L 105 - 106  CO2 22 - 32 mmol/L 37(H) - 34(H)  Calcium 8.9 - 10.3 mg/dL 9.6 - 9.2  Total Protein 6.5 - 8.1 g/dL - - -  Total Bilirubin 0.3 - 1.2 mg/dL - - -  Alkaline Phos 38 - 126 U/L - - -  AST 15 - 41 U/L - - -  ALT 0 - 44 U/L - - -   CBC Latest Ref Rng & Units 12/06/2018 12/06/2018 12/05/2018  WBC 4.0 - 10.5 K/uL 12.9(H) - 12.6(H)  Hemoglobin 12.0 - 15.0 g/dL 15.6(H) 16.7(H) 14.8  Hematocrit 36.0 - 46.0 % 52.5(H) 49.0(H) 51.0(H)   Platelets 150 - 400 K/uL 227 - 206   ABG    Component Value Date/Time   PHART 7.442 12/06/2018 0304   PCO2ART 65.2 (HH) 12/06/2018 0304   PO2ART 86.0 12/06/2018 0304   HCO3 44.0 (H) 12/06/2018 0304   TCO2 46 (H) 12/06/2018 0304   O2SAT 96.0 12/06/2018 0304   CBG (last 3)  Recent Labs    12/06/18 0035 12/06/18 0302 12/06/18 0838  GLUCAP 171* 164* 153*   The patient is critically ill with multiple organ systems failure and requires high complexity decision making for assessment and support, frequent evaluation and titration of therapies, application of advanced monitoring technologies and extensive interpretation of multiple databases.   Critical Care Time devoted to patient care services described in this note is  32  Minutes. This time reflects time of care of this signee Dr Koren Bound. This critical care time does not reflect procedure time, or teaching time or supervisory time of PA/NP/Med student/Med Resident etc but could involve care discussion time.  Alyson Reedy, M.D. [facility] Pulmonary/Critical Care Medicine. Pager: [phone]. After hours pager: [phone].

## 2018-12-06 NOTE — Progress Notes (Signed)
Elink called: pt coughing against vent, grasping at throat and nods yes to pain in throat. Versed given throughout the night, next dose not due yet.

## 2018-12-07 ENCOUNTER — Inpatient Hospital Stay (HOSPITAL_COMMUNITY): Payer: Self-pay

## 2018-12-07 DIAGNOSIS — I10 Essential (primary) hypertension: Secondary | ICD-10-CM

## 2018-12-07 LAB — BASIC METABOLIC PANEL
Anion gap: 13 (ref 5–15)
Anion gap: 13 (ref 5–15)
BUN: 64 mg/dL — ABNORMAL HIGH (ref 6–20)
BUN: 68 mg/dL — ABNORMAL HIGH (ref 6–20)
CO2: 33 mmol/L — ABNORMAL HIGH (ref 22–32)
CO2: 32 mmol/L (ref 22–32)
Calcium: 9.9 mg/dL (ref 8.9–10.3)
Calcium: 9.9 mg/dL (ref 8.9–10.3)
Chloride: 106 mmol/L (ref 98–111)
Chloride: 107 mmol/L (ref 98–111)
Creatinine, Ser: 0.96 mg/dL (ref 0.44–1.00)
Creatinine, Ser: 0.98 mg/dL (ref 0.44–1.00)
GFR calc Af Amer: >60 mL/min (ref >60)
GFR calc Af Amer: >60 mL/min (ref >60)
GFR calc non Af Amer: >60 mL/min (ref >60)
GFR calc non Af Amer: >60 mL/min (ref >60)
GLUCOSE: 181 mg/dL — AB (ref 70–99)
Glucose, Bld: 182 mg/dL — ABNORMAL HIGH (ref 70–99)
Potassium: 3.4 mmol/L — ABNORMAL LOW (ref 3.5–5.1)
Potassium: 3.4 mmol/L — ABNORMAL LOW (ref 3.5–5.1)
Sodium: 151 mmol/L — ABNORMAL HIGH (ref 135–145)
Sodium: 153 mmol/L — ABNORMAL HIGH (ref 135–145)

## 2018-12-07 LAB — POCT I-STAT 7, (LYTES, BLD GAS, ICA,H+H)
Acid-Base Excess: 12 mmol/L — ABNORMAL HIGH (ref 0.0–2.0)
Bicarbonate: 38.7 mmol/L — ABNORMAL HIGH (ref 20.0–28.0)
CALCIUM ION: 1.25 mmol/L (ref 1.15–1.40)
HCT: 47 % — ABNORMAL HIGH (ref 36.0–46.0)
Hemoglobin: 16 g/dL — ABNORMAL HIGH (ref 12.0–15.0)
O2 SAT: 95 %
Patient temperature: 100.1
Potassium: 3.6 mmol/L (ref 3.5–5.1)
Sodium: 156 mmol/L — ABNORMAL HIGH (ref 135–145)
TCO2: 40 mmol/L — ABNORMAL HIGH (ref 22–32)
pCO2 arterial: 57 mmHg — ABNORMAL HIGH (ref 32.0–48.0)
pH, Arterial: 7.444 (ref 7.350–7.450)
pO2, Arterial: 80 mmHg — ABNORMAL LOW (ref 83.0–108.0)

## 2018-12-07 LAB — CBC
HEMATOCRIT: 52.2 % — AB (ref 36.0–46.0)
Hemoglobin: 15.3 g/dL — ABNORMAL HIGH (ref 12.0–15.0)
MCH: 29.2 pg (ref 26.0–34.0)
MCHC: 29.3 g/dL — ABNORMAL LOW (ref 30.0–36.0)
MCV: 99.6 fL (ref 80.0–100.0)
Platelets: 232 10*3/uL (ref 150–400)
RBC: 5.24 MIL/uL — ABNORMAL HIGH (ref 3.87–5.11)
RDW: 14.6 % (ref 11.5–15.5)
WBC: 15.7 10*3/uL — ABNORMAL HIGH (ref 4.0–10.5)
nRBC: 0 % (ref 0.0–0.2)

## 2018-12-07 LAB — PHOSPHORUS
Phosphorus: 3.3 mg/dL (ref 2.5–4.6)
Phosphorus: 4.3 mg/dL (ref 2.5–4.6)

## 2018-12-07 LAB — GLUCOSE, CAPILLARY
GLUCOSE-CAPILLARY: 169 mg/dL — AB (ref 70–99)
Glucose-Capillary: 146 mg/dL — ABNORMAL HIGH (ref 70–99)
Glucose-Capillary: 152 mg/dL — ABNORMAL HIGH (ref 70–99)
Glucose-Capillary: 160 mg/dL — ABNORMAL HIGH (ref 70–99)
Glucose-Capillary: 174 mg/dL — ABNORMAL HIGH (ref 70–99)
Glucose-Capillary: 176 mg/dL — ABNORMAL HIGH (ref 70–99)

## 2018-12-07 LAB — MAGNESIUM
Magnesium: 3 mg/dL — ABNORMAL HIGH (ref 1.7–2.4)
Magnesium: 2.9 mg/dL — ABNORMAL HIGH (ref 1.7–2.4)

## 2018-12-07 MED ORDER — METOLAZONE 5 MG PO TABS
5.0000 mg | ORAL_TABLET | Freq: Every day | ORAL | Status: AC
  Administered 2018-12-07: 5 mg via ORAL
  Filled 2018-12-07: qty 1

## 2018-12-07 MED ORDER — POTASSIUM CHLORIDE 20 MEQ/15ML (10%) PO SOLN
40.0000 meq | Freq: Three times a day (TID) | ORAL | Status: AC
  Administered 2018-12-07 (×2): 40 meq
  Filled 2018-12-07 (×2): qty 30

## 2018-12-07 MED ORDER — POTASSIUM CHLORIDE 20 MEQ/15ML (10%) PO SOLN
40.0000 meq | Freq: Once | ORAL | Status: AC
  Administered 2018-12-07: 40 meq
  Filled 2018-12-07: qty 30

## 2018-12-07 MED ORDER — FUROSEMIDE 10 MG/ML IJ SOLN
40.0000 mg | Freq: Four times a day (QID) | INTRAMUSCULAR | Status: AC
  Administered 2018-12-07 – 2018-12-08 (×3): 40 mg via INTRAVENOUS
  Filled 2018-12-07 (×3): qty 4

## 2018-12-07 MED ORDER — ACETAZOLAMIDE SODIUM 500 MG IJ SOLR
250.0000 mg | Freq: Four times a day (QID) | INTRAMUSCULAR | Status: AC
  Administered 2018-12-07 – 2018-12-08 (×3): 250 mg via INTRAVENOUS
  Filled 2018-12-07 (×3): qty 250

## 2018-12-07 NOTE — Progress Notes (Signed)
eLink Physician-Brief Progress Note [name] DOB: [birth year] MRN: [id number]   Date of Service  12/07/2018  HPI/Events of Note  Notified of significant ectopies on telemetry. Last K 3.4 on diuretics  eICU Interventions  Ordered K 40 meqs per OG     Intervention Category Major Interventions: Electrolyte abnormality - evaluation and management;Arrhythmia - evaluation and management  Darl Pikes 12/07/2018, 1:07 AM

## 2018-12-07 NOTE — Progress Notes (Signed)
NAME:  Veronica Castaneda, MRN:  [id number], DOB:  December 11, 1960, LOS: 9 ADMISSION DATE:  11/28/2018, CONSULTATION DATE:  3/6 REFERRING MD:  isaacs, CHIEF COMPLAINT:  Acute respiratory failure    Brief History   58 yo Spanish speaking female presented with 3 days of cough, fever, chills, dyspnea.  Found to have hypoxia with interstitial infiltrates.  Required intubation.  Past Medical History  No significant medical history  Significant Hospital Events   3/6 admitted 3/12 increasing vent requirement   Consults:    Procedures:  ETT 3/6>>>  Significant Diagnostic Tests:  Echo 3/07 >> EF 45 to 50%  Micro Data:  Influenza 3/6>>>Flu B Sputum 3/6>>>oral flora BCXx2 3/6>>>  UC 3/6>>>negative RVP 3/6>>>Flu B Pneumococcal Ag 3/07>>>negative Legionella Ag 3/07>>>  Antimicrobials:  Zithromax 3/6>>>3/7 Rocephin 3/6> to end 3/13   Interim history/subjective:  Intermittent desaturation but improving this AM  Objective   Blood pressure 121/89, pulse 79, temperature 99.9 F (37.7 C), temperature source Core, resp. rate 16, height 4\' 10"  (1.473 m), weight 111.5 kg, SpO2 95 %.    Vent Mode: PRVC FiO2 (%):  [70 %-80 %] 70 % Set Rate:  [18 bmp] 18 bmp Vt Set:  [320 mL] 320 mL PEEP:  [14 cmH20-16 cmH20] 14 cmH20 Plateau Pressure:  [23 cmH20-27 cmH20] 23 cmH20   Intake/Output Summary (Last 24 hours) at 12/07/2018 1202 Last data filed at 12/07/2018 1100 Gross per 24 hour  Intake 2527.67 ml  Output 2725 ml  Net -197.33 ml   Filed Weights   12/05/18 0500 12/06/18 0345 12/07/18 0500  Weight: 112.9 kg 112.6 kg 111.5 kg   Physical Exam: General: Acutely ill appearing female, NAD HEENT: [place]/AT, PERRL,EOM-I and MMM Neuro: Awake and interactive, moving all ext to command CV: RRR, Nl S1/S2 and -M/R/G PULM: Diffuse crackles GI: Soft, NT, ND and +BS Extremities: Diffuse edema noted Skin: clean dry warm intact  I reviewed CXR myself, infiltrate noted  Resolved Hospital Problem  list     Assessment & Plan:   Acute on chronic hypoxic/hypercapnic respiratory failure from Influenza B pneumonia. ?PNA- respiratory culture more indicative of normal flora  Likely also has OSA and OHS. Plan - Maintain full vent support - Decrease to 60% and 10 as the goal for today - Continue active diureses - Rocephin course complete - CXR and ABG in AM - ARDS protocol  Systolic CHF >> undetermined whether acute or chronic. Plan - Lasix today as below - Trend BMP  Electrolyte abnormality Hypokalemia, Hypernatremia Plan - Monitor BMP - Replace lytes PRN - FWF q8 for hypernatremia - BMET in AM - Lasix 40 mg IV q6 x3 doses - Zaroxolyn 5 mg POx1 - Diamox 250 q8 x2 doses - Potassium replacement  Constipation Plan  - Increased bowel regimen: BID colace and senokot. PRN fleet and dulcolax  - Continue tube feeds - Continue MOM - Hold SMOG enema  - In the event of arrest, the patient will have NO CPR NO CARDIOVERSION   No family bedside to update  Best practice:  Diet: Enteral nutrition DVT prophylaxis: SQ Heparin GI prophylaxis: protonix Mobility: bed rest Code Status: Limited: no CPR, No Cardioversion Family Communication: Family updated in conference room  Disposition: Continue ICU level of care   Labs    CMP Latest Ref Rng & Units 12/07/2018 12/07/2018 12/06/2018  Glucose 70 - 99 mg/dL - 888(K) 800(L)  BUN 6 - 20 mg/dL - 49(Z) 79(X)  Creatinine 0.44 - 1.00 mg/dL - 5.05 6.97  Sodium 135 - 145 mmol/L 156(H) 153(H) 151(H)  Potassium 3.5 - 5.1 mmol/L 3.6 3.4(L) 3.4(L)  Chloride 98 - 111 mmol/L - 107 106  CO2 22 - 32 mmol/L - 33(H) 32  Calcium 8.9 - 10.3 mg/dL - 9.9 9.9  Total Protein 6.5 - 8.1 g/dL - - -  Total Bilirubin 0.3 - 1.2 mg/dL - - -  Alkaline Phos 38 - 126 U/L - - -  AST 15 - 41 U/L - - -  ALT 0 - 44 U/L - - -   CBC Latest Ref Rng & Units 12/07/2018 12/07/2018 12/06/2018  WBC 4.0 - 10.5 K/uL - 15.7(H) 12.9(H)  Hemoglobin 12.0 - 15.0 g/dL  16.0(H) 15.3(H) 15.6(H)  Hematocrit 36.0 - 46.0 % 47.0(H) 52.2(H) 52.5(H)  Platelets 150 - 400 K/uL - 232 227   ABG    Component Value Date/Time   PHART 7.444 12/07/2018 0500   PCO2ART 57.0 (H) 12/07/2018 0500   PO2ART 80.0 (L) 12/07/2018 0500   HCO3 38.7 (H) 12/07/2018 0500   TCO2 40 (H) 12/07/2018 0500   O2SAT 95.0 12/07/2018 0500   CBG (last 3)  Recent Labs    12/07/18 0348 12/07/18 0748 12/07/18 1122  GLUCAP 176* 152* 174*   The patient is critically ill with multiple organ systems failure and requires high complexity decision making for assessment and support, frequent evaluation and titration of therapies, application of advanced monitoring technologies and extensive interpretation of multiple databases.   Critical Care Time devoted to patient care services described in this note is  32  Minutes. This time reflects time of care of this signee Dr Koren Bound. This critical care time does not reflect procedure time, or teaching time or supervisory time of PA/NP/Med student/Med Resident etc but could involve care discussion time.  Alyson Reedy, M.D. [facility] Pulmonary/Critical Care Medicine. Pager: [phone]. After hours pager: [phone].

## 2018-12-08 ENCOUNTER — Inpatient Hospital Stay (HOSPITAL_COMMUNITY): Payer: Self-pay

## 2018-12-08 DIAGNOSIS — I5023 Acute on chronic systolic (congestive) heart failure: Secondary | ICD-10-CM

## 2018-12-08 DIAGNOSIS — Z9911 Dependence on respirator [ventilator] status: Secondary | ICD-10-CM

## 2018-12-08 DIAGNOSIS — N179 Acute kidney failure, unspecified: Secondary | ICD-10-CM

## 2018-12-08 LAB — BASIC METABOLIC PANEL
Anion gap: 12 (ref 5–15)
Anion gap: 14 (ref 5–15)
Anion gap: 11 (ref 5–15)
BUN: 92 mg/dL — ABNORMAL HIGH (ref 6–20)
BUN: 88 mg/dL — ABNORMAL HIGH (ref 6–20)
BUN: 80 mg/dL — ABNORMAL HIGH (ref 6–20)
CALCIUM: 9.8 mg/dL (ref 8.9–10.3)
CALCIUM: 9.8 mg/dL (ref 8.9–10.3)
CO2: 33 mmol/L — ABNORMAL HIGH (ref 22–32)
CO2: 32 mmol/L (ref 22–32)
CO2: 33 mmol/L — ABNORMAL HIGH (ref 22–32)
CREATININE: 1.36 mg/dL — AB (ref 0.44–1.00)
Calcium: 9.9 mg/dL (ref 8.9–10.3)
Chloride: 111 mmol/L (ref 98–111)
Chloride: 111 mmol/L (ref 98–111)
Chloride: 113 mmol/L — ABNORMAL HIGH (ref 98–111)
Creatinine, Ser: 1.18 mg/dL — ABNORMAL HIGH (ref 0.44–1.00)
Creatinine, Ser: 1.11 mg/dL — ABNORMAL HIGH (ref 0.44–1.00)
GFR calc Af Amer: 51 mL/min — ABNORMAL LOW (ref >60)
GFR calc Af Amer: >60 mL/min (ref >60)
GFR calc Af Amer: >60 mL/min (ref >60)
GFR calc non Af Amer: 44 mL/min — ABNORMAL LOW (ref >60)
GFR calc non Af Amer: 52 mL/min — ABNORMAL LOW (ref >60)
GFR calc non Af Amer: 56 mL/min — ABNORMAL LOW (ref >60)
Glucose, Bld: 176 mg/dL — ABNORMAL HIGH (ref 70–99)
Glucose, Bld: 180 mg/dL — ABNORMAL HIGH (ref 70–99)
Glucose, Bld: 173 mg/dL — ABNORMAL HIGH (ref 70–99)
Potassium: 3.2 mmol/L — ABNORMAL LOW (ref 3.5–5.1)
Potassium: 3 mmol/L — ABNORMAL LOW (ref 3.5–5.1)
Potassium: 3.4 mmol/L — ABNORMAL LOW (ref 3.5–5.1)
Sodium: 156 mmol/L — ABNORMAL HIGH (ref 135–145)
Sodium: 157 mmol/L — ABNORMAL HIGH (ref 135–145)
Sodium: 157 mmol/L — ABNORMAL HIGH (ref 135–145)

## 2018-12-08 LAB — MAGNESIUM
Magnesium: 3.1 mg/dL — ABNORMAL HIGH (ref 1.7–2.4)
Magnesium: 3.1 mg/dL — ABNORMAL HIGH (ref 1.7–2.4)

## 2018-12-08 LAB — CBC
HEMATOCRIT: 52.1 % — AB (ref 36.0–46.0)
Hemoglobin: 15.2 g/dL — ABNORMAL HIGH (ref 12.0–15.0)
MCH: 29 pg (ref 26.0–34.0)
MCHC: 29.2 g/dL — ABNORMAL LOW (ref 30.0–36.0)
MCV: 99.4 fL (ref 80.0–100.0)
Platelets: 249 10*3/uL (ref 150–400)
RBC: 5.24 MIL/uL — ABNORMAL HIGH (ref 3.87–5.11)
RDW: 14.6 % (ref 11.5–15.5)
WBC: 13 10*3/uL — ABNORMAL HIGH (ref 4.0–10.5)
nRBC: 0 % (ref 0.0–0.2)

## 2018-12-08 LAB — POCT I-STAT 7, (LYTES, BLD GAS, ICA,H+H)
Acid-Base Excess: 12 mmol/L — ABNORMAL HIGH (ref 0.0–2.0)
Bicarbonate: 38.7 mmol/L — ABNORMAL HIGH (ref 20.0–28.0)
Calcium, Ion: 1.23 mmol/L (ref 1.15–1.40)
HCT: 47 % — ABNORMAL HIGH (ref 36.0–46.0)
Hemoglobin: 16 g/dL — ABNORMAL HIGH (ref 12.0–15.0)
O2 Saturation: 92 %
Patient temperature: 100.3
Potassium: 2.9 mmol/L — ABNORMAL LOW (ref 3.5–5.1)
Sodium: 158 mmol/L — ABNORMAL HIGH (ref 135–145)
TCO2: 40 mmol/L — ABNORMAL HIGH (ref 22–32)
pCO2 arterial: 59.2 mmHg — ABNORMAL HIGH (ref 32.0–48.0)
pH, Arterial: 7.428 (ref 7.350–7.450)
pO2, Arterial: 67 mmHg — ABNORMAL LOW (ref 83.0–108.0)

## 2018-12-08 LAB — GLUCOSE, CAPILLARY
GLUCOSE-CAPILLARY: 210 mg/dL — AB (ref 70–99)
Glucose-Capillary: 152 mg/dL — ABNORMAL HIGH (ref 70–99)
Glucose-Capillary: 154 mg/dL — ABNORMAL HIGH (ref 70–99)
Glucose-Capillary: 159 mg/dL — ABNORMAL HIGH (ref 70–99)
Glucose-Capillary: 188 mg/dL — ABNORMAL HIGH (ref 70–99)
Glucose-Capillary: 193 mg/dL — ABNORMAL HIGH (ref 70–99)

## 2018-12-08 LAB — PHOSPHORUS
PHOSPHORUS: 4.1 mg/dL (ref 2.5–4.6)
Phosphorus: 5.3 mg/dL — ABNORMAL HIGH (ref 2.5–4.6)

## 2018-12-08 MED ORDER — POTASSIUM CHLORIDE 20 MEQ/15ML (10%) PO SOLN
40.0000 meq | Freq: Four times a day (QID) | ORAL | Status: DC
  Filled 2018-12-08: qty 30

## 2018-12-08 MED ORDER — FUROSEMIDE 10 MG/ML IJ SOLN: 40.0000 mg | Freq: Four times a day (QID) | INTRAMUSCULAR | Status: DC

## 2018-12-08 MED ORDER — NYSTATIN 100000 UNIT/GM EX POWD
Freq: Three times a day (TID) | CUTANEOUS | Status: DC
  Administered 2018-12-08 – 2018-12-14 (×15): via TOPICAL
  Filled 2018-12-08: qty 15

## 2018-12-08 MED ORDER — STERILE WATER FOR INJECTION IJ SOLN
INTRAMUSCULAR | Status: AC
  Administered 2018-12-08: 5 mL
  Filled 2018-12-08: qty 10

## 2018-12-08 MED ORDER — SEVELAMER CARBONATE 0.8 G PO PACK
0.8000 g | PACK | Freq: Three times a day (TID) | ORAL | Status: DC
  Administered 2018-12-08 (×2): 0.8 g
  Filled 2018-12-08 (×4): qty 1

## 2018-12-08 MED ORDER — FREE WATER: 150.0000 mL | Freq: Four times a day (QID) | Status: DC

## 2018-12-08 MED ORDER — POTASSIUM CHLORIDE 20 MEQ/15ML (10%) PO SOLN: 40.0000 meq | Freq: Four times a day (QID) | ORAL | Status: DC

## 2018-12-08 MED ORDER — POTASSIUM CHLORIDE 20 MEQ/15ML (10%) PO SOLN
40.0000 meq | Freq: Four times a day (QID) | ORAL | Status: AC
  Administered 2018-12-08 (×3): 40 meq
  Filled 2018-12-08 (×2): qty 30

## 2018-12-08 MED ORDER — POTASSIUM CHLORIDE 20 MEQ/15ML (10%) PO SOLN: 40.0000 meq | Freq: Two times a day (BID) | ORAL | Status: DC

## 2018-12-08 MED ORDER — FREE WATER
250.0000 mL | Freq: Four times a day (QID) | Status: DC
  Administered 2018-12-08 – 2018-12-09 (×3): 250 mL

## 2018-12-08 NOTE — Progress Notes (Signed)
MASD noted in inner thighs MD notified area cleased well with barrier cream applied foley catheter removed as per order.

## 2018-12-08 NOTE — Progress Notes (Addendum)
NAME:  Veronica Castaneda, MRN:  [id number], DOB:  [birth year], LOS: 10 ADMISSION DATE:  11/28/2018, CONSULTATION DATE:  3/6 REFERRING MD:  isaacs, CHIEF COMPLAINT:  Acute respiratory failure    Brief History   58 yo Spanish speaking female presented with 3 days of cough, fever, chills, dyspnea.  Found to have hypoxia with interstitial infiltrates.  Required intubation. Flu B positive   Past Medical History  No significant medical history  Significant Hospital Events   3/6 admitted 3/12 increasing vent requirement   Consults:    Procedures:  ETT 3/6>>>  Significant Diagnostic Tests:  Echo 3/07 >> EF 45 to 50% CXR 3/16> bilateral infiltrates   Micro Data:  Influenza 3/6>>>Flu B Sputum 3/6>>>oral flora BCXx2 3/6>>>  UC 3/6>>>negative RVP 3/6>>>Flu B Pneumococcal Ag 3/07>>>negative Legionella Ag 3/07>>>  Antimicrobials:  Zithromax 3/6>>>3/7 Rocephin 3/6> to end 3/13   Interim history/subjective:  Hypokalemic overnight with associated ectopy. K+ replaced overnight.  Worse hypokalemia this morning, with ectopy. Replacing K+ aggressively this morning.   Objective   Blood pressure 105/73, pulse 75, temperature 100.2 F (37.9 C), temperature source Core, resp. rate (!) 24, height 4\' 10"  (1.473 m), weight 114.9 kg, SpO2 96 %.    Vent Mode: PRVC FiO2 (%):  [60 %-70 %] 60 % Set Rate:  [18 bmp] 18 bmp Vt Set:  [320 mL] 320 mL PEEP:  [12 cmH20-16 cmH20] 12 cmH20 Plateau Pressure:  [20 cmH20-24 cmH20] 21 cmH20   Intake/Output Summary (Last 24 hours) at 12/08/2018 0804 Last data filed at 12/08/2018 0800 Gross per 24 hour  Intake 1439.08 ml  Output 1600 ml  Net -160.92 ml   Filed Weights   12/06/18 0345 12/07/18 0500 12/08/18 0500  Weight: 112.6 kg 111.5 kg 114.9 kg   Physical Exam: General: Morbidly obese adult female, intubated, NAD  HEENT: NCAT, pink mmm, trachea midline, ETT OGT secure  Neuro: Awake, alert, interactive. Moves BUE BLE spontaneously  CV:  irregular rhythm, 2+ radial pulses, capillary refill < 3 seconds PULM: Bilateral crackles, no wheezing. Compliant with vent, symmetrical chest expansion, no accessory muscle recruitment  GI: soft, round, ndnt, normoactive x4  Extremities: BUE BLE 1+ edema. Symmetrical bulk and tone, no obvious deformity  Skin: Clean, dry, warm, intact     Resolved Hospital Problem list     Assessment & Plan:   Acute on chronic hypoxic/hypercapnic respiratory failure from Influenza B pneumonia. ?PNA- respiratory culture more indicative of normal flora  Likely also has OSA and OHS. -CXR- persistent bilateral opacities -ABG 3/16:  7.428/59.2/67/40/12/38.7/92 Plan - Maintain full vent support, ARDS protocol  - Wean as able, goal is to decrease support to 10 PEEP while maintaining 60% FiO2 today -Titrate PEEP/FiO2 as needed for SpO2 >88% - s/p completed rocephin course -AM CXR -Adding CPT to assist with secretion clearance -Continue VAP bundle, pulmonary hygiene   Systolic CHF >> undetermined whether acute or chronic. -ECHO 3/7 LVEF 45-50%, mild hypokinesis  Plan - Continue Lasix as below -Continue Strict I/O  -continue daily weight   Acute Renal Insufficiency -fluid overload P - holding diuresis today - s/p Lasix 40 x3 doses yesterday -s/p Zaroxolyn 5 mg POx1 yesterday - s/p Diamox 250 q8 x2 doses yesterday -Strict I/O -Continue to monitor BUN/Cr, electrolytes as below  Electrolyte abnormality Hypokalemia, Hypernatremia, hyperphosphatemia  Plan - AM BMP - FWF 2500 q6 for Hypernatremia -Starting phos binder for hyperphosphatemia  -replace potassium  -Recheck BMP this afternoon  Constipation Plan  - Increased bowel regimen: BID  colace and senokot. PRN fleet and dulcolax - Continue MOM, continue to trend Cr (if Cr > 2, stop MoM and sue other agent)   Goals of Care - Limited Code: No CPR No Cardioversion   Best practice:  Diet: Enteral Nutrition  DVT prophylaxis: SQ Heparin GI  prophylaxis: protonix Mobility: bed rest Code Status: Limited: no CPR, No Cardioversion Family Communication: One family member at bedside Disposition: Continue ICU level of care   Labs    CMP Latest Ref Rng & Units 12/08/2018 12/08/2018 12/07/2018  Glucose 70 - 99 mg/dL - 144(R) -  BUN 6 - 20 mg/dL - 15(Q) -  Creatinine 0.08 - 1.00 mg/dL - 6.76(P) -  Sodium 950 - 145 mmol/L 158(H) 156(H) 156(H)  Potassium 3.5 - 5.1 mmol/L 2.9(L) 3.2(L) 3.6  Chloride 98 - 111 mmol/L - 111 -  CO2 22 - 32 mmol/L - 33(H) -  Calcium 8.9 - 10.3 mg/dL - 9.8 -  Total Protein 6.5 - 8.1 g/dL - - -  Total Bilirubin 0.3 - 1.2 mg/dL - - -  Alkaline Phos 38 - 126 U/L - - -  AST 15 - 41 U/L - - -  ALT 0 - 44 U/L - - -   CBC Latest Ref Rng & Units 12/08/2018 12/08/2018 12/07/2018  WBC 4.0 - 10.5 K/uL - 13.0(H) -  Hemoglobin 12.0 - 15.0 g/dL 16.0(H) 15.2(H) 16.0(H)  Hematocrit 36.0 - 46.0 % 47.0(H) 52.1(H) 47.0(H)  Platelets 150 - 400 K/uL - 249 -   ABG    Component Value Date/Time   PHART 7.428 12/08/2018 0423   PCO2ART 59.2 (H) 12/08/2018 0423   PO2ART 67.0 (L) 12/08/2018 0423   HCO3 38.7 (H) 12/08/2018 0423   TCO2 40 (H) 12/08/2018 0423   O2SAT 92.0 12/08/2018 0423   CBG (last 3)  Recent Labs    12/07/18 1935 12/08/18 0013 12/08/18 0416  GLUCAP 169* 193* 188*   Critical Care Time: 45 minutes    Tessie Fass MSN, AGACNP-BC [place] Pulmonary/Critical Care Medicine [phone] If no answer, [phone] 12/08/2018, 8:05 AM

## 2018-12-09 ENCOUNTER — Inpatient Hospital Stay (HOSPITAL_COMMUNITY): Payer: Self-pay

## 2018-12-09 DIAGNOSIS — A419 Sepsis, unspecified organism: Secondary | ICD-10-CM

## 2018-12-09 DIAGNOSIS — R6520 Severe sepsis without septic shock: Secondary | ICD-10-CM

## 2018-12-09 LAB — BASIC METABOLIC PANEL
Anion gap: 12 (ref 5–15)
Anion gap: 8 (ref 5–15)
BUN: 72 mg/dL — ABNORMAL HIGH (ref 6–20)
BUN: 57 mg/dL — ABNORMAL HIGH (ref 6–20)
CALCIUM: 9.8 mg/dL (ref 8.9–10.3)
CO2: 28 mmol/L (ref 22–32)
CO2: 29 mmol/L (ref 22–32)
Calcium: 9.6 mg/dL (ref 8.9–10.3)
Chloride: 117 mmol/L — ABNORMAL HIGH (ref 98–111)
Chloride: 119 mmol/L — ABNORMAL HIGH (ref 98–111)
Creatinine, Ser: 0.93 mg/dL (ref 0.44–1.00)
Creatinine, Ser: 0.92 mg/dL (ref 0.44–1.00)
GFR calc Af Amer: >60 mL/min (ref >60)
GFR calc Af Amer: >60 mL/min (ref >60)
GFR calc non Af Amer: >60 mL/min (ref >60)
GFR calc non Af Amer: >60 mL/min (ref >60)
GLUCOSE: 160 mg/dL — AB (ref 70–99)
Glucose, Bld: 185 mg/dL — ABNORMAL HIGH (ref 70–99)
Potassium: 3.3 mmol/L — ABNORMAL LOW (ref 3.5–5.1)
Potassium: 3.5 mmol/L (ref 3.5–5.1)
Sodium: 157 mmol/L — ABNORMAL HIGH (ref 135–145)
Sodium: 156 mmol/L — ABNORMAL HIGH (ref 135–145)

## 2018-12-09 LAB — CBC
HCT: 50.3 % — ABNORMAL HIGH (ref 36.0–46.0)
Hemoglobin: 14.7 g/dL (ref 12.0–15.0)
MCH: 29.4 pg (ref 26.0–34.0)
MCHC: 29.2 g/dL — AB (ref 30.0–36.0)
MCV: 100.6 fL — ABNORMAL HIGH (ref 80.0–100.0)
Platelets: 238 10*3/uL (ref 150–400)
RBC: 5 MIL/uL (ref 3.87–5.11)
RDW: 14.6 % (ref 11.5–15.5)
WBC: 9.7 10*3/uL (ref 4.0–10.5)
nRBC: 0 % (ref 0.0–0.2)

## 2018-12-09 LAB — GLUCOSE, CAPILLARY
GLUCOSE-CAPILLARY: 135 mg/dL — AB (ref 70–99)
Glucose-Capillary: 154 mg/dL — ABNORMAL HIGH (ref 70–99)
Glucose-Capillary: 158 mg/dL — ABNORMAL HIGH (ref 70–99)
Glucose-Capillary: 160 mg/dL — ABNORMAL HIGH (ref 70–99)
Glucose-Capillary: 162 mg/dL — ABNORMAL HIGH (ref 70–99)
Glucose-Capillary: 163 mg/dL — ABNORMAL HIGH (ref 70–99)
Glucose-Capillary: 189 mg/dL — ABNORMAL HIGH (ref 70–99)

## 2018-12-09 LAB — MAGNESIUM
MAGNESIUM: 2.9 mg/dL — AB (ref 1.7–2.4)
Magnesium: 2.9 mg/dL — ABNORMAL HIGH (ref 1.7–2.4)

## 2018-12-09 LAB — PHOSPHORUS
Phosphorus: 3.1 mg/dL (ref 2.5–4.6)
Phosphorus: 2.6 mg/dL (ref 2.5–4.6)

## 2018-12-09 MED ORDER — POTASSIUM CHLORIDE 20 MEQ/15ML (10%) PO SOLN: 40.0000 meq | Freq: Four times a day (QID) | ORAL | Status: DC

## 2018-12-09 MED ORDER — FREE WATER
350.0000 mL | Status: DC
  Administered 2018-12-09 – 2018-12-10 (×7): 350 mL

## 2018-12-09 MED ORDER — FREE WATER: 300.0000 mL | Freq: Four times a day (QID) | Status: DC

## 2018-12-09 MED ORDER — POTASSIUM CHLORIDE 20 MEQ/15ML (10%) PO SOLN
40.0000 meq | Freq: Four times a day (QID) | ORAL | Status: AC
  Administered 2018-12-09 (×3): 40 meq
  Filled 2018-12-09 (×3): qty 30

## 2018-12-09 NOTE — Progress Notes (Signed)
Ventilator changes were made by MD this AM.  Peep was decreased from 10 to 8, FIO2 was decreased from 45% to 35%, and patient was placed on CPAP/PSV of 8.  On arrival to perform ventilator check patient is currently tolerating SBT well and vitals are stable at this time.  Will continue to monitor.

## 2018-12-09 NOTE — Progress Notes (Signed)
NAME:  Veronica Castaneda, MRN:  [id number], DOB:  05-Dec-1960, LOS: 11 ADMISSION DATE:  11/28/2018, CONSULTATION DATE:  3/6 REFERRING MD:  isaacs, CHIEF COMPLAINT:  Acute respiratory failure    Brief History   58 yo Spanish speaking female presented with 3 days of cough, fever, chills, dyspnea.  Found to have hypoxia with interstitial infiltrates.  Required intubation. Flu B positive   Past Medical History  No significant medical history  Significant Hospital Events   3/6 admitted 3/12 increasing vent requirement   Consults:    Procedures:  ETT 3/6>>>  Significant Diagnostic Tests:  Echo 3/07 >> EF 45 to 50% CXR 3/16> bilateral infiltrates   Micro Data:  Influenza 3/6>>>Flu B Sputum 3/6>>>oral flora BCXx2 3/6>>>  UC 3/6>>>negative RVP 3/6>>>Flu B Pneumococcal Ag 3/07>>>negative Legionella Ag 3/07>>>  Antimicrobials:  Zithromax 3/6>>>3/7 Rocephin 3/6> 3/13   Interim history/subjective:  Improving hypokalemia, however still low and exhibiting some ectopy. Continuing to replace this morning. No acute events overnight. This morning Weaned FiO2 to 45% (from 60% yesterday) and PEEP to 10 (weaned to 10 yesterday)   Objective   Blood pressure 109/70, pulse 92, temperature 100.3 F (37.9 C), temperature source Core, resp. rate (!) 31, height 4\' 10"  (1.473 m), weight 114.9 kg, SpO2 93 %.    Vent Mode: PRVC FiO2 (%):  [45 %-60 %] 50 % Set Rate:  [18 bmp] 18 bmp Vt Set:  [320 mL] 320 mL PEEP:  [10 cmH20-12 cmH20] 10 cmH20 Plateau Pressure:  [14 cmH20-21 cmH20] 14 cmH20   Intake/Output Summary (Last 24 hours) at 12/09/2018 0736 Last data filed at 12/08/2018 2200 Gross per 24 hour  Intake 1057.74 ml  Output 570 ml  Net 487.74 ml   Filed Weights   12/06/18 0345 12/07/18 0500 12/08/18 0500  Weight: 112.6 kg 111.5 kg 114.9 kg   Physical Exam: General: Morbidly obese adult female, intubated, laying in bed in NAD  HEENT: NCAT pink mmm PERRL, ETT OGT secure  Neuro:Awake, alert, interactive. Following commands, moves BUE BLE spontaneously CV: irregular rhythm, s1s2, no r/g/m, 2+ radial pulses, no JVD PULM: Bilateral rhonchi, diminished bibasilar breath sounds. No accessory muscle recruitment, no signs of respiratory distress  GI: Soft, round, ndnt, normoactive bowel sounds x4  Extremities: Symmetrical bulk and tone. Capillary refill < 3 seconds BUE BLE, trace edema  Skin: Clean, dry, warm, some skin breakdown near labial folds. No rashes    Resolved Hospital Problem list     Assessment & Plan:   Acute on chronic hypoxic/hypercapnic respiratory failure from Influenza B pneumonia. ?PNA- respiratory culture more indicative of normal flora  Likely also has OSA and OHS. -CXR 3/17 bilateral atelectasis  -s/p rocephin course  Plan - Continue mechanical ventilatory support - Wean as able,  titrating PEEP/FiO2 as able for SpO2> 92 -If able to get PEEP </= 8, can start SBT  - AM CXR -Continue CPT -Continue VAP bundle  Systolic CHF >> undetermined whether acute or chronic. -ECHO 3/7 LVEF 45-50%, mild hypokinesis  Plan -Holding diuresis at this time, net negative for hospitalization s/p aggressive diuresis for several days -Continue Strict I/O  -continue daily weight  - Evaluate daily if patient would benefit further diuresis  Acute Renal Insufficiency, improving  P - holding diuresis today -Continue FWF as below for gentle hydration  -Strict I/O -Continue to evaluate BUN/Cr  Electrolyte abnormality Hypokalemia, Hypernatremia, hyperphosphatemia  Plan - AM BMP, mag phos  - FWF 300 q6 for Hypernatremia -dc phos binder  -replacing  K+  -Recheck K+ this afternoon  -If patient continues to be hypernatremic despite increasing FWF, may be worth considering changing EN formula to Nepro   Constipation, improving -LBM 3/16 Plan  - Continue  BID colace and senokot.  - PRN fleet and dulcolax - Continue MOM, continue to trend Cr (if Cr > 2,  stop MoM and use other agent)  -minimize narcotics as able   Goals of Care - Limited Code: No CPR No Cardioversion   Best practice:  Diet: Enteral Nutrition  DVT prophylaxis: SQ Heparin GI prophylaxis: protonix Mobility: bed rest Code Status: Limited: no CPR, No Cardioversion Family Communication: One family member at bedside. Disposition: Continue ICU level of care   Labs    CMP Latest Ref Rng & Units 12/09/2018 12/08/2018 12/08/2018  Glucose 70 - 99 mg/dL 669(P) 675(U) 125(O)  BUN 6 - 20 mg/dL 83(W) 34(K) 88(T)  Creatinine 0.44 - 1.00 mg/dL 3.73 0.81(Q) 8.38(Z)  Sodium 135 - 145 mmol/L 157(H) 157(H) 157(H)  Potassium 3.5 - 5.1 mmol/L 3.3(L) 3.4(L) 3.0(L)  Chloride 98 - 111 mmol/L 117(H) 113(H) 111  CO2 22 - 32 mmol/L 28 33(H) 32  Calcium 8.9 - 10.3 mg/dL 9.8 9.8 9.9  Total Protein 6.5 - 8.1 g/dL - - -  Total Bilirubin 0.3 - 1.2 mg/dL - - -  Alkaline Phos 38 - 126 U/L - - -  AST 15 - 41 U/L - - -  ALT 0 - 44 U/L - - -   CBC Latest Ref Rng & Units 12/09/2018 12/08/2018 12/08/2018  WBC 4.0 - 10.5 K/uL 9.7 - 13.0(H)  Hemoglobin 12.0 - 15.0 g/dL 06.5 16.0(H) 15.2(H)  Hematocrit 36.0 - 46.0 % 50.3(H) 47.0(H) 52.1(H)  Platelets 150 - 400 K/uL 238 - 249   ABG    Component Value Date/Time   PHART 7.428 12/08/2018 0423   PCO2ART 59.2 (H) 12/08/2018 0423   PO2ART 67.0 (L) 12/08/2018 0423   HCO3 38.7 (H) 12/08/2018 0423   TCO2 40 (H) 12/08/2018 0423   O2SAT 92.0 12/08/2018 0423   CBG (last 3)  Recent Labs    12/09/18 0040 12/09/18 0343 12/09/18 0726  GLUCAP 154* 163* 160*   Critical Care Time: 40 minutes   Tessie Fass MSN, AGACNP-BC [place] [facility] [phone] If no answer, [phone] 12/09/2018, 7:36 AM

## 2018-12-09 NOTE — Progress Notes (Signed)
eLink Physician-Brief Progress Note Patient Name: Tailynn Syverson DOB: [birth year] MRN: [id number]   Date of Service  12/09/2018  HPI/Events of Note  Patient on PEEP = 8 and ventilator orders have PEEP = 10.   eICU Interventions  Will change PEEP order to 8.     Intervention Category Major Interventions: Respiratory failure - evaluation and management  Osaze Hubbert Eugene 12/09/2018, 8:17 PM

## 2018-12-10 LAB — BASIC METABOLIC PANEL
Anion gap: 8 (ref 5–15)
BUN: 52 mg/dL — ABNORMAL HIGH (ref 6–20)
CO2: 32 mmol/L (ref 22–32)
Calcium: 9.7 mg/dL (ref 8.9–10.3)
Chloride: 117 mmol/L — ABNORMAL HIGH (ref 98–111)
Creatinine, Ser: 0.94 mg/dL (ref 0.44–1.00)
GFR calc Af Amer: >60 mL/min (ref >60)
GFR calc non Af Amer: >60 mL/min (ref >60)
Glucose, Bld: 174 mg/dL — ABNORMAL HIGH (ref 70–99)
Potassium: 3.9 mmol/L (ref 3.5–5.1)
SODIUM: 157 mmol/L — AB (ref 135–145)

## 2018-12-10 LAB — GLUCOSE, CAPILLARY
Glucose-Capillary: 176 mg/dL — ABNORMAL HIGH (ref 70–99)
Glucose-Capillary: 132 mg/dL — ABNORMAL HIGH (ref 70–99)
Glucose-Capillary: 160 mg/dL — ABNORMAL HIGH (ref 70–99)
Glucose-Capillary: 128 mg/dL — ABNORMAL HIGH (ref 70–99)
Glucose-Capillary: 130 mg/dL — ABNORMAL HIGH (ref 70–99)

## 2018-12-10 LAB — MAGNESIUM
Magnesium: 2.7 mg/dL — ABNORMAL HIGH (ref 1.7–2.4)
Magnesium: 2.5 mg/dL — ABNORMAL HIGH (ref 1.7–2.4)

## 2018-12-10 LAB — PHOSPHORUS
PHOSPHORUS: 3.2 mg/dL (ref 2.5–4.6)
Phosphorus: 3.3 mg/dL (ref 2.5–4.6)

## 2018-12-10 MED ORDER — ORAL CARE MOUTH RINSE
15.0000 mL | Freq: Two times a day (BID) | OROMUCOSAL | Status: DC
  Administered 2018-12-10 – 2018-12-12 (×4): 15 mL via OROMUCOSAL

## 2018-12-10 MED ORDER — POTASSIUM CHLORIDE 20 MEQ/15ML (10%) PO SOLN
40.0000 meq | Freq: Two times a day (BID) | ORAL | Status: AC
  Administered 2018-12-10: 40 meq
  Filled 2018-12-10: qty 30

## 2018-12-10 MED ORDER — SODIUM CHLORIDE 0.9% FLUSH: 3.0000 mL | INTRAVENOUS | Status: DC | PRN

## 2018-12-10 MED ORDER — SODIUM CHLORIDE 0.9% FLUSH
3.0000 mL | Freq: Two times a day (BID) | INTRAVENOUS | Status: DC
  Administered 2018-12-10: 10 mL via INTRAVENOUS
  Administered 2018-12-10 – 2018-12-13 (×6): 3 mL via INTRAVENOUS

## 2018-12-10 MED ORDER — SODIUM CHLORIDE 0.9 % IV SOLN: 250.0000 mL | INTRAVENOUS | Status: DC | PRN

## 2018-12-10 MED ORDER — FUROSEMIDE 10 MG/ML IJ SOLN
40.0000 mg | Freq: Once | INTRAMUSCULAR | Status: AC
  Administered 2018-12-10: 40 mg via INTRAVENOUS

## 2018-12-10 NOTE — Procedures (Signed)
Extubation Procedure Note  Patient Details:   Name: Veronica Castaneda DOB: 06-07-1961 MRN: [id number]   Airway Documentation:    Vent end date: 12/10/18 Vent end time: 1138   Evaluation  O2 sats: stable throughout Complications: No apparent complications Patient did tolerate procedure well. Bilateral Breath Sounds: Clear, Diminished   Yes   Positive cuff leak noted. Pt placed on [place] 4 L with humidity, no stridor noted.  Pt able to reach 625 mL using incentive spirometer.  Family at bedside.  Forest Becker Ludie Hudon 12/10/2018, 11:58 AM

## 2018-12-10 NOTE — Progress Notes (Signed)
NAME:  Veronica Castaneda, MRN:  [id number], DOB:  August 13, 1961, LOS: 12 ADMISSION DATE:  11/28/2018, CONSULTATION DATE:  3/6 REFERRING MD:  isaacs, CHIEF COMPLAINT:  Acute respiratory failure    Brief History   58 yo Spanish speaking female presented with 3 days of cough, fever, chills, dyspnea.  Found to have hypoxia with interstitial infiltrates.  Required intubation. Flu B positive   Past Medical History  No significant medical history  Significant Hospital Events   3/6 admitted 3/12 increasing vent requirement   Consults:    Procedures:  ETT 3/6>>>  Significant Diagnostic Tests:  Echo 3/07 >> EF 45 to 50% CXR 3/16> bilateral infiltrates   Micro Data:  Influenza 3/6>>>Flu B Sputum 3/6>>>oral flora UC 3/6>>>negative RVP 3/6>>>Flu B Pneumococcal Ag 3/07>>>negative Legionella Ag 3/07>>>  Antimicrobials:  Zithromax 3/6>>>3/7 Rocephin 3/6> 3/13   Interim history/subjective:   No acute events overnight Tolerating SBT very well this morning   Objective   Blood pressure 124/75, pulse 88, temperature 98.4 F (36.9 C), temperature source Core, resp. rate (!) 22, height 4\' 10"  (1.473 m), weight 115.2 kg, SpO2 93 %.    Vent Mode: PSV;CPAP FiO2 (%):  [35 %] 35 % Set Rate:  [18 bmp] 18 bmp Vt Set:  [320 mL] 320 mL PEEP:  [5 cmH20-8 cmH20] 8 cmH20 Pressure Support:  [5 cmH20-8 cmH20] 5 cmH20 Plateau Pressure:  [16 cmH20-20 cmH20] 20 cmH20   Intake/Output Summary (Last 24 hours) at 12/10/2018 0954 Last data filed at 12/10/2018 0800 Gross per 24 hour  Intake 2378.12 ml  Output 1050 ml  Net 1328.12 ml   Filed Weights   12/07/18 0500 12/08/18 0500 12/10/18 0500  Weight: 111.5 kg 114.9 kg 115.2 kg   Physical Exam: General: Obese adult female, intubated, laying in bed NAD  HEENT: NCAT pink mmm  Neuro: Awake, alert, interactive. Following basic commands CV: RRR s1s2 no r/g/m no jvd 2+ radial pulses  PULM: Some scattered crackles bilaterally.  GI: Soft, round,  ndnt, normoactive bowel sounds x4  Extremities: Symmetrical bulk and tone, Capillary refill < 3 seconds, trace edema BLE Skin: clean, dry, warm. Nystatin powder applied to groin, labial folds    Resolved Hospital Problem list     Assessment & Plan:   Acute on chronic hypoxic/hypercapnic respiratory failure from Influenza B pneumonia. ?PNA- respiratory culture more indicative of normal flora  Likely also has OSA and OHS. -CXR 3/17 bilateral atelectasis  -s/p rocephin course  Plan - Doing well SBT this morning - evaluate for possible extubation today, will discuss with attending this morning  - consider extubate to BiPAP - AM CXR tomorrow if unable to extubate today -Continue CPT -Continue VAP  Systolic CHF >> undetermined whether acute or chronic. -ECHO 3/7 LVEF 45-50%, mild hypokinesis  Plan -Holding diuresis at this time (s/p aggressive diuresis for several days, patient net negative at this time without signs of fluid overload at this time)  -Continue Strict I/O  -continue daily weight    Acute Renal Insufficiency, improving  P - Continue FWF for hydration -Continue to monitor BMP  Electrolyte abnormality Hypokalemia, Hypernatremia, hyperphosphatemia  Plan - Continue to check BMP, mag phos -replace electrolytes as needed -FWF 350 for hypernatremia -have consulted RDN regarding possibly changing EN formula to lower sodium   Constipation, improving -LBM 3/16 Plan  - Continue colace, senakot, MOM, PRN fleet, dulcolax - PRN fleet and dulcolax   Goals of Care - Limited Code: No CPR No Cardioversion    Best  practice:  Diet: Enteral Nutrition  DVT prophylaxis: subcutaneous heparin GI prophylaxis: protonix  Mobility: bed rset  Code Status: Limited: no CPR, No Cardioversion Family Communication: Family member at bedside, updated with assistance of interpreter  Disposition: continue ICU care   Labs    CMP Latest Ref Rng & Units 12/10/2018 12/09/2018 12/09/2018   Glucose 70 - 99 mg/dL 782(N) 562(Z) 308(M)  BUN 6 - 20 mg/dL 57(Q) 46(N) 62(X)  Creatinine 0.44 - 1.00 mg/dL 5.28 4.13 2.44  Sodium 135 - 145 mmol/L 157(H) 156(H) 157(H)  Potassium 3.5 - 5.1 mmol/L 3.9 3.5 3.3(L)  Chloride 98 - 111 mmol/L 117(H) 119(H) 117(H)  CO2 22 - 32 mmol/L 32 29 28  Calcium 8.9 - 10.3 mg/dL 9.7 9.6 9.8  Total Protein 6.5 - 8.1 g/dL - - -  Total Bilirubin 0.3 - 1.2 mg/dL - - -  Alkaline Phos 38 - 126 U/L - - -  AST 15 - 41 U/L - - -  ALT 0 - 44 U/L - - -   CBC Latest Ref Rng & Units 12/09/2018 12/08/2018 12/08/2018  WBC 4.0 - 10.5 K/uL 9.7 - 13.0(H)  Hemoglobin 12.0 - 15.0 g/dL 01.0 16.0(H) 15.2(H)  Hematocrit 36.0 - 46.0 % 50.3(H) 47.0(H) 52.1(H)  Platelets 150 - 400 K/uL 238 - 249   ABG    Component Value Date/Time   PHART 7.428 12/08/2018 0423   PCO2ART 59.2 (H) 12/08/2018 0423   PO2ART 67.0 (L) 12/08/2018 0423   HCO3 38.7 (H) 12/08/2018 0423   TCO2 40 (H) 12/08/2018 0423   O2SAT 92.0 12/08/2018 0423   CBG (last 3)  Recent Labs    12/09/18 2338 12/10/18 0333 12/10/18 0726  GLUCAP 162* 176* 132*   Critical Care Time: 45 minutes   Tessie Fass MSN, AGACNP-BC [place] [facility] [phone] If no answer, [phone] 12/10/2018, 9:54 AM

## 2018-12-10 NOTE — Progress Notes (Signed)
Bedside RN swallow done, pt coughed with graham crackers but did well with sips of water.  Will get speech consult to look at pt.  Continue to closely monitor.

## 2018-12-11 ENCOUNTER — Inpatient Hospital Stay: Payer: Self-pay

## 2018-12-11 DIAGNOSIS — G4733 Obstructive sleep apnea (adult) (pediatric): Secondary | ICD-10-CM

## 2018-12-11 DIAGNOSIS — E870 Hyperosmolality and hypernatremia: Secondary | ICD-10-CM

## 2018-12-11 LAB — BASIC METABOLIC PANEL
Anion gap: 13 (ref 5–15)
BUN: 44 mg/dL — ABNORMAL HIGH (ref 6–20)
CO2: 28 mmol/L (ref 22–32)
Calcium: 9.5 mg/dL (ref 8.9–10.3)
Chloride: 111 mmol/L (ref 98–111)
Creatinine, Ser: 0.78 mg/dL (ref 0.44–1.00)
GFR calc Af Amer: >60 mL/min (ref >60)
GFR calc non Af Amer: >60 mL/min (ref >60)
Glucose, Bld: 120 mg/dL — ABNORMAL HIGH (ref 70–99)
Potassium: 3.5 mmol/L (ref 3.5–5.1)
SODIUM: 152 mmol/L — AB (ref 135–145)

## 2018-12-11 LAB — CBC
HCT: 46 % (ref 36.0–46.0)
Hemoglobin: 13.3 g/dL (ref 12.0–15.0)
MCH: 28.5 pg (ref 26.0–34.0)
MCHC: 28.9 g/dL — ABNORMAL LOW (ref 30.0–36.0)
MCV: 98.7 fL (ref 80.0–100.0)
NRBC: 0 % (ref 0.0–0.2)
Platelets: 249 10*3/uL (ref 150–400)
RBC: 4.66 MIL/uL (ref 3.87–5.11)
RDW: 14.3 % (ref 11.5–15.5)
WBC: 7.4 10*3/uL (ref 4.0–10.5)

## 2018-12-11 LAB — GLUCOSE, CAPILLARY
GLUCOSE-CAPILLARY: 135 mg/dL — AB (ref 70–99)
Glucose-Capillary: 104 mg/dL — ABNORMAL HIGH (ref 70–99)
Glucose-Capillary: 114 mg/dL — ABNORMAL HIGH (ref 70–99)
Glucose-Capillary: 114 mg/dL — ABNORMAL HIGH (ref 70–99)
Glucose-Capillary: 121 mg/dL — ABNORMAL HIGH (ref 70–99)
Glucose-Capillary: 126 mg/dL — ABNORMAL HIGH (ref 70–99)
Glucose-Capillary: 153 mg/dL — ABNORMAL HIGH (ref 70–99)

## 2018-12-11 LAB — MAGNESIUM
Magnesium: 2.4 mg/dL (ref 1.7–2.4)
Magnesium: 2.3 mg/dL (ref 1.7–2.4)

## 2018-12-11 LAB — PHOSPHORUS
PHOSPHORUS: 3.8 mg/dL (ref 2.5–4.6)
Phosphorus: 3.3 mg/dL (ref 2.5–4.6)

## 2018-12-11 MED ORDER — ADULT MULTIVITAMIN W/MINERALS CH
1.0000 | ORAL_TABLET | Freq: Every day | ORAL | Status: DC
  Administered 2018-12-11 – 2018-12-14 (×4): 1 via ORAL
  Filled 2018-12-11 (×4): qty 1

## 2018-12-11 MED ORDER — ENSURE ENLIVE PO LIQD
237.0000 mL | Freq: Two times a day (BID) | ORAL | Status: DC
  Administered 2018-12-11 – 2018-12-12 (×3): 237 mL via ORAL

## 2018-12-11 MED ORDER — PANTOPRAZOLE SODIUM 40 MG PO TBEC
40.0000 mg | DELAYED_RELEASE_TABLET | Freq: Every day | ORAL | Status: DC
  Administered 2018-12-11 – 2018-12-14 (×4): 40 mg via ORAL
  Filled 2018-12-11 (×4): qty 1

## 2018-12-11 NOTE — Evaluation (Signed)
Physical Therapy Evaluation Patient Details Name: Veronica Castaneda MRN: [id number] DOB: Nov 11, 1960 Today's Date: 12/11/2018   History of Present Illness  Pt is a 58 year old woman admitted 11/28/18 with acute hypoxic respiratory failure with pulmonary edema 2/2 flu B PNA. ETT 3/6-3/18.     Clinical Impression  Pt admitted with above diagnosis. Pt currently with functional limitations due to the deficits listed below (see PT Problem List). PTA patient, independent. Today presents with weakness, limited tolerance ambulating to bedside chair 5' today with min A. Next visit to progress gait with close chair follow, anticipate she will progress quickly and be able to return home with HHPT.  Pt will benefit from skilled PT to increase their independence and safety with mobility to allow discharge to the venue listed below.       Follow Up Recommendations Home health PT;Supervision/Assistance - 24 hour(with progression)    Equipment Recommendations  (TBD)    Recommendations for Other Services       Precautions / Restrictions Precautions Precautions: Fall Restrictions Weight Bearing Restrictions: No      Mobility  Bed Mobility Overal bed mobility: Needs Assistance Bed Mobility: Supine to Sit     Supine to sit: Min guard     General bed mobility comments: HOB up  Transfers Overall transfer level: Needs assistance Equipment used: 2 person hand held assist Transfers: Sit to/from Stand Sit to Stand: +2 physical assistance;Min assist         General transfer comment: assist to rise and steady, initial posterior bias  Ambulation/Gait Ambulation/Gait assistance: Min assist Gait Distance (Feet): 5 Feet   Gait Pattern/deviations: Step-to pattern Gait velocity: decreased   General Gait Details: Min A to take sevreal steps to chair in room, patient weak and unsteady at this time.   Stairs            Wheelchair Mobility    Modified Rankin (Stroke Patients  Only)       Balance Overall balance assessment: Needs assistance   Sitting balance-Leahy Scale: Fair       Standing balance-Leahy Scale: Poor                               Pertinent Vitals/Pain Pain Assessment: No/denies pain    Home Living Family/patient expects to be discharged to:: Private residence Living Arrangements: Children;Spouse/significant other Available Help at Discharge: Family;Available 24 hours/day Type of Home: House Home Access: Stairs to enter Entrance Stairs-Rails: None Entrance Stairs-Number of Steps: 2 Home Layout: One level Home Equipment: None      Prior Function Level of Independence: Independent               Hand Dominance   Dominant Hand: Right    Extremity/Trunk Assessment   Upper Extremity Assessment Upper Extremity Assessment: Overall WFL for tasks assessed    Lower Extremity Assessment Lower Extremity Assessment: Generalized weakness(gross strength 3+/5)    Cervical / Trunk Assessment Cervical / Trunk Assessment: Other exceptions Cervical / Trunk Exceptions: obesity  Communication   Communication: Prefers language other than English(spansih)  Cognition Arousal/Alertness: Awake/alert Behavior During Therapy: WFL for tasks assessed/performed Overall Cognitive Status: Within Functional Limits for tasks assessed                                        General Comments  Exercises     Assessment/Plan    PT Assessment Patient needs continued PT services  PT Problem List Decreased strength       PT Treatment Interventions DME instruction;Gait training;Stair training;Functional mobility training;Therapeutic activities;Balance training;Therapeutic exercise;Neuromuscular re-education    PT Goals (Current goals can be found in the Care Plan section)  Acute Rehab PT Goals Patient Stated Goal: to return home with family PT Goal Formulation: With patient/family Time For Goal Achievement:  12/25/18 Potential to Achieve Goals: Good    Frequency Min 3X/week   Barriers to discharge        Co-evaluation PT/OT/SLP Co-Evaluation/Treatment: Yes Reason for Co-Treatment: For patient/therapist safety;To address functional/ADL transfers PT goals addressed during session: Mobility/safety with mobility;Proper use of DME;Strengthening/ROM;Balance         AM-PAC PT "6 Clicks" Mobility  Outcome Measure Help needed turning from your back to your side while in a flat bed without using bedrails?: None Help needed moving from lying on your back to sitting on the side of a flat bed without using bedrails?: A Little Help needed moving to and from a bed to a chair (including a wheelchair)?: A Little Help needed standing up from a chair using your arms (e.g., wheelchair or bedside chair)?: A Lot Help needed to walk in hospital room?: A Lot Help needed climbing 3-5 steps with a railing? : Total 6 Click Score: 15    End of Session Equipment Utilized During Treatment: Gait belt Activity Tolerance: Patient tolerated treatment well Patient left: in chair;with call bell/phone within reach Nurse Communication: Mobility status PT Visit Diagnosis: Unsteadiness on feet (R26.81)    Time: 1100-1145 PT Time Calculation (min) (ACUTE ONLY): 45 min   Charges:   PT Evaluation $PT Eval Low Complexity: 1 Low PT Treatments $Therapeutic Activity: 8-22 mins        Etta Grandchild, PT, DPT Acute Rehabilitation Services Pager: [phone] Office: [phone]    Etta Grandchild 12/11/2018, 3:12 PM

## 2018-12-11 NOTE — Evaluation (Signed)
Occupational Therapy Evaluation Patient Details Name: Veronica Castaneda MRN: [id number] DOB: [birth year] Today's Date: 12/11/2018    History of Present Illness Pt is a 58 year old woman admitted 11/28/18 with acute hypoxic respiratory failure with pulmonary edema 2/2 flu B PNA. ETT 3/6-3/18.    Clinical Impression   Pt was independent prior to admission. Presents with generalized weakness and impaired standing balance with decreased activity tolerance. She performed remarkably well for her first time OOB since admission. Pt ambulated a short distance with +2 min assist and RW. She needs set up to max assist for ADL. Anticipate pt will progress well. Will follow acutely.    Follow Up Recommendations  Home health OT;Supervision/Assistance - 24 hour(initially)    Equipment Recommendations  Tub/shower seat    Recommendations for Other Services       Precautions / Restrictions Precautions Precautions: Fall Restrictions Weight Bearing Restrictions: No      Mobility Bed Mobility Overal bed mobility: Needs Assistance Bed Mobility: Supine to Sit     Supine to sit: Min guard     General bed mobility comments: HOB up  Transfers Overall transfer level: Needs assistance Equipment used: 2 person hand held assist Transfers: Sit to/from Stand Sit to Stand: +2 physical assistance;Min assist         General transfer comment: assist to rise and steady, initial posterior bias    Balance Overall balance assessment: Needs assistance   Sitting balance-Leahy Scale: Fair       Standing balance-Leahy Scale: Poor                             ADL either performed or assessed with clinical judgement   ADL Overall ADL's : Needs assistance/impaired Eating/Feeding: Independent;Sitting   Grooming: Set up;Sitting   Upper Body Bathing: Minimal assistance;Sitting   Lower Body Bathing: Maximal assistance;Sit to/from stand   Upper Body Dressing : Minimal  assistance;Sitting   Lower Body Dressing: Maximal assistance;Bed level   Toilet Transfer: +2 for physical assistance;Minimal assistance;Ambulation;RW;BSC   Toileting- Clothing Manipulation and Hygiene: Maximal assistance;Sit to/from stand       Functional mobility during ADLs: Minimal assistance;+2 for physical assistance;Rolling walker       Vision Patient Visual Report: No change from baseline       Perception     Praxis      Pertinent Vitals/Pain Pain Assessment: No/denies pain     Hand Dominance Right   Extremity/Trunk Assessment Upper Extremity Assessment Upper Extremity Assessment: Overall WFL for tasks assessed   Lower Extremity Assessment Lower Extremity Assessment: Defer to PT evaluation   Cervical / Trunk Assessment Cervical / Trunk Assessment: Other exceptions Cervical / Trunk Exceptions: obesity   Communication Communication Communication: Prefers language other than English(spanish)   Cognition Arousal/Alertness: Awake/alert Behavior During Therapy: WFL for tasks assessed/performed Overall Cognitive Status: Within Functional Limits for tasks assessed                                     General Comments       Exercises     Shoulder Instructions      Home Living Family/patient expects to be discharged to:: Private residence Living Arrangements: Children;Spouse/significant other(daughter and grandchild) Available Help at Discharge: Family;Available 24 hours/day Type of Home: House Home Access: Stairs to enter [employer] of Steps: 2 Entrance Stairs-Rails: None Home Layout: One level  Bathroom Shower/Tub: Doctor, general practice: None          Prior Functioning/Environment Level of Independence: Independent                 OT Problem List: Decreased strength;Impaired balance (sitting and/or standing);Decreased knowledge of use of DME or AE;Impaired UE  functional use      OT Treatment/Interventions: Self-care/ADL training;Energy conservation;DME and/or AE instruction;Patient/family education;Balance training    OT Goals(Current goals can be found in the care plan section) Acute Rehab OT Goals Patient Stated Goal: to return home with family OT Goal Formulation: With patient Time For Goal Achievement: 12/25/18 Potential to Achieve Goals: Good ADL Goals Pt Will Perform Grooming: with supervision;standing Pt Will Perform Upper Body Dressing: with set-up;sitting Pt Will Perform Lower Body Dressing: with supervision;sit to/from stand Pt Will Transfer to Toilet: with supervision;ambulating;regular height toilet Pt Will Perform Toileting - Clothing Manipulation and hygiene: with supervision;sit to/from stand Pt Will Perform Tub/Shower Transfer: with min assist;ambulating;shower seat;rolling walker Additional ADL Goal #1: Pt will employ energy conservation strategies during ADL and mobility independently.  OT Frequency: Min 2X/week   Barriers to D/C:            Co-evaluation PT/OT/SLP Co-Evaluation/Treatment: Yes Reason for Co-Treatment: For patient/therapist safety   OT goals addressed during session: ADL's and self-care      AM-PAC OT "6 Clicks" Daily Activity     Outcome Measure Help from another person eating meals?: None Help from another person taking care of personal grooming?: A Little Help from another person toileting, which includes using toliet, bedpan, or urinal?: A Lot Help from another person bathing (including washing, rinsing, drying)?: A Lot Help from another person to put on and taking off regular upper body clothing?: A Little Help from another person to put on and taking off regular lower body clothing?: A Lot 6 Click Score: 16   End of Session Equipment Utilized During Treatment: Rolling walker;Oxygen;Gait belt Nurse Communication: Mobility status  Activity Tolerance: Patient tolerated treatment  well Patient left: in chair;with call bell/phone within reach;with family/visitor present;with nursing/sitter in room  OT Visit Diagnosis: Unsteadiness on feet (R26.81);Other abnormalities of gait and mobility (R26.89);Muscle weakness (generalized) (M62.81)                Time: 2952-8413 OT Time Calculation (min): 22 min Charges:  OT General Charges $OT Visit: 1 Visit OT Evaluation $OT Eval Moderate Complexity: 1 Mod  Martie Round, OTR/L Acute Rehabilitation Services Pager: [phone] Office: [phone] Evern Bio 12/11/2018, 12:09 PM

## 2018-12-11 NOTE — Progress Notes (Signed)
PROGRESS NOTE    Veronica Castaneda  OEU:[id number]  DOB: [birth year]  DOA: 11/28/2018 PCP: Patient, No Pcp Per  Brief Narrative:   58 yo Spanish speaking female presented with 3 days of cough, fever, chills, dyspnea.  Found to have hypoxia with interstitial infiltrates.  Pneumococcal antigen and Legionella antigen were negative but Flu B was positive  Required intubation and admitted to ICU on March 6. Echocardiogram in this admission showed EF of 40 to 45%.  Patient received IV diuresis during ICU stay but subsequently held in view of AKI.  Subjective:  Patient feels much improved.  Denies any chest pain.  Currently satting well on 5 L O2 nasal cannula.  Spanish interpreter bedside and helped with conversation.  Objective: Vitals:   12/11/18 1300 12/11/18 1429 12/11/18 1600 12/11/18 1930  BP: (!) 121/102 (!) 119/92 119/86 109/81  Pulse: 92 94 79 84  Resp: 19 (!) 26 (!) 25 20  Temp:   98.7 F (37.1 C) 98.7 F (37.1 C)  TempSrc:   Oral Axillary  SpO2: 94% 94% 93% 97%  Weight:      Height:        Intake/Output Summary (Last 24 hours) at 12/11/2018 2013 Last data filed at 12/11/2018 1600 Gross per 24 hour  Intake 1100 ml  Output 750 ml  Net 350 ml   Filed Weights   12/08/18 0500 12/10/18 0500 12/11/18 0500  Weight: 114.9 kg 115.2 kg 111.1 kg    Physical Examination:  General exam: Appears calm and comfortable  Respiratory system: Clear to auscultation. Respiratory effort normal. Cardiovascular system: S1 & S2 heard, RRR. No JVD, murmurs, rubs, gallops or clicks.  Trace pitting pedal edema. Gastrointestinal system: Abdomen is nondistended, soft and nontender. No organomegaly or masses felt. Normal bowel sounds heard. Central nervous system: Alert and oriented. No focal neurological deficits. Extremities: Symmetric 5 x 5 power. Skin: No rashes, lesions or ulcers Psychiatry: Judgement and insight appear normal. Mood & affect appropriate.     Data Reviewed: I  have personally reviewed following labs and imaging studies  CBC: Recent Labs  Lab 12/06/18 0439 12/07/18 0344 12/07/18 0500 12/08/18 0336 12/08/18 0423 12/09/18 0325 12/11/18 0723  WBC 12.9* 15.7*  --  13.0*  --  9.7 7.4  HGB 15.6* 15.3* 16.0* 15.2* 16.0* 14.7 13.3  HCT 52.5* 52.2* 47.0* 52.1* 47.0* 50.3* 46.0  MCV 100.0 99.6  --  99.4  --  100.6* 98.7  PLT 227 232  --  249  --  238 249   Basic Metabolic Panel: Recent Labs  Lab 12/08/18 1924 12/09/18 0325 12/09/18 1605 12/09/18 2007 12/10/18 0450 12/10/18 1652 12/11/18 0723 12/11/18 1648  NA 157* 157*  --  156* 157*  --  152*  --   K 3.4* 3.3*  --  3.5 3.9  --  3.5  --   CL 113* 117*  --  119* 117*  --  111  --   CO2 33* 28  --  29 32  --  28  --   GLUCOSE 173* 185*  --  160* 174*  --  120*  --   BUN 80* 72*  --  57* 52*  --  44*  --   CREATININE 1.11* 0.93  --  0.92 0.94  --  0.78  --   CALCIUM 9.8 9.8  --  9.6 9.7  --  9.5  --   MG  --  2.9* 2.9*  --  2.7* 2.5* 2.4 2.3  PHOS  --  3.1 2.6  --  3.2 3.3 3.8 3.3   GFR: Estimated Creatinine Clearance: 86.5 mL/min (by C-G formula based on SCr of 0.78 mg/dL). Liver Function Tests: No results for input(s): AST, ALT, ALKPHOS, BILITOT, PROT, ALBUMIN in the last 168 hours. No results for input(s): LIPASE, AMYLASE in the last 168 hours. No results for input(s): AMMONIA in the last 168 hours. Coagulation Profile: No results for input(s): INR, PROTIME in the last 168 hours. Cardiac Enzymes: No results for input(s): CKTOTAL, CKMB, CKMBINDEX, TROPONINI in the last 168 hours. BNP (last 3 results) No results for input(s): PROBNP in the last 8760 hours. HbA1C: No results for input(s): HGBA1C in the last 72 hours. CBG: Recent Labs  Lab 12/11/18 0007 12/11/18 0433 12/11/18 0732 12/11/18 1214 12/11/18 1608  GLUCAP 114* 135* 114* 153* 126*   Lipid Profile: No results for input(s): CHOL, HDL, LDLCALC, TRIG, CHOLHDL, LDLDIRECT in the last 72 hours. Thyroid Function Tests:  No results for input(s): TSH, T4TOTAL, FREET4, T3FREE, THYROIDAB in the last 72 hours. Anemia Panel: No results for input(s): VITAMINB12, FOLATE, FERRITIN, TIBC, IRON, RETICCTPCT in the last 72 hours. Sepsis Labs: No results for input(s): PROCALCITON, LATICACIDVEN in the last 168 hours.  No results found for this or any previous visit (from the past 240 hour(s)).    Radiology Studies: No results found.      Scheduled Meds: . docusate  100 mg Per Tube BID  . feeding supplement (ENSURE ENLIVE)  237 mL Oral BID BM  . heparin  5,000 Units Subcutaneous Q8H  . insulin aspart  0-9 Units Subcutaneous Q4H  . ipratropium-albuterol  3 mL Nebulization Q6H  . mouth rinse  15 mL Mouth Rinse BID  . multivitamin with minerals  1 tablet Oral Daily  . nystatin   Topical TID  . pantoprazole  40 mg Oral Daily  . sennosides  5 mL Per Tube BID  . sodium chloride flush  3 mL Intravenous Q12H   Continuous Infusions: . sodium chloride      Assessment & Plan:    1.  Acute on chronic hypoxic/hypercapnic respiratory failure secondary to viral pneumonia/systolic CHF with underlying obstructive sleep apnea: Patient required intubation in March 6 to March 18.  Patient completed empiric antibiotic course with Rocephin.  Chest x-ray on March 17 showed bilateral atelectasis.  Diuretics on hold in concern for AKI and hypernatremia.  Extubated on March 18.  Currently saturating well on 5 L of nasal cannula.  Seen by speech therapy and cleared for regular diet  2.  Acute on chronic systolic CHF: Received aggressive diuresis during ICU stay.  Currently diuretics on hold in concern for problem #3.  Showed EF of 40 to 45%  3.  AKI//hypernatremia/hypokalemia: In the setting of overdiuresis.  Diuretics on hold.  Sodium improving.  Will repeat labs in a.m.  Status post potassium supplementation  4.  Constipation: Continue laxatives as needed  5.  Deconditioning: Physical therapy to follow-up and recommend  DVT  prophylaxis: Heparin Code Status: Partial code.  No CPR/cardioversion Family / Patient Communication: Discussed with patient and daughter bedside with the help of Spanish interpreter Disposition Plan: Possibly home     LOS: 13 days    Time spent:     Alessandra Bevels, MD [facility] Pager [phone]  If 7PM-7AM, please contact night-coverage [email or web address] Password [facility] 12/11/2018, 8:13 PM

## 2018-12-11 NOTE — Evaluation (Signed)
Clinical/Bedside Swallow Evaluation Patient Details  Name: Veronica Castaneda MRN: [id number] Date of Birth: Feb 01, 1961  Today's Date: 12/11/2018 Time: SLP Start Time (ACUTE ONLY): 0840 SLP Stop Time (ACUTE ONLY): 0852 SLP Time Calculation (min) (ACUTE ONLY): 12 min  Past Medical History: History reviewed. No pertinent past medical history. Past Surgical History: History reviewed. No pertinent surgical history. HPI:  58 yo Spanish speaking female presented with 3 days of cough, fever, chills, dyspnea.  Found to have hypoxia with interstitial infiltrates.  Required intubation from 3/6 to 3/18. Was Flu B positive.    Assessment / Plan / Recommendation Clinical Impression  Pt demonstrates no signs of aspiration with 3 oz water swallow challenge or any other PO administered. Two delayed cough noted while mobilizing pt, assumed to be unrelated to PO intake. Pt to resume regular texture diet. No SLP f/u needed will sign off.  SLP Visit Diagnosis: Dysphagia, unspecified (R13.10)    Aspiration Risk  Mild aspiration risk    Diet Recommendation Regular;Thin liquid   Liquid Administration via: Cup;Straw Medication Administration: Whole meds with liquid Supervision: Patient able to self feed Postural Changes: Seated upright at 90 degrees    Other  Recommendations     Follow up Recommendations None      Frequency and Duration            Prognosis        Swallow Study   General HPI: 58 yo Spanish speaking female presented with 3 days of cough, fever, chills, dyspnea.  Found to have hypoxia with interstitial infiltrates.  Required intubation from 3/6 to 3/18. Was Flu B positive.  Type of Study: Bedside Swallow Evaluation Previous Swallow Assessment: none Diet Prior to this Study: NPO Temperature Spikes Noted: No Respiratory Status: Nasal cannula History of Recent Intubation: Yes Length of Intubations (days): 13 days Date extubated: 12/10/18 Behavior/Cognition:  Alert;Cooperative;Pleasant mood Oral Cavity Assessment: Within Functional Limits Oral Care Completed by SLP: No Oral Cavity - Dentition: Adequate natural dentition Vision: Functional for self-feeding Self-Feeding Abilities: Able to feed self Patient Positioning: Upright in bed Baseline Vocal Quality: Hoarse Volitional Cough: Strong Volitional Swallow: Able to elicit    Oral/Motor/Sensory Function Overall Oral Motor/Sensory Function: Within functional limits   Ice Chips     Thin Liquid Thin Liquid: Within functional limits Presentation: Cup;Straw;Self Fed    Nectar Thick Nectar Thick Liquid: Not tested   Honey Thick Honey Thick Liquid: Not tested   Puree Puree: Within functional limits Presentation: Self Fed;Spoon   Solid     Solid: Within functional limits Presentation: Self Fed     Harlon Ditty, MA CCC-SLP  Acute Rehabilitation Services Pager [phone] Office [phone]  Claudine Mouton 12/11/2018,9:28 AM

## 2018-12-11 NOTE — Progress Notes (Signed)
Nutrition Follow-up  DOCUMENTATION CODES:   Morbid obesity  INTERVENTION:   Ensure Enlive po BID, each supplement provides 350 kcal and 20 grams of protein  Encourage po fluids given hypernatremia; pt may require D5W infusion temporarily until free water deficit resolved   NUTRITION DIAGNOSIS:  Inadequate oral intake related to inability to eat as evidenced by NPO status.  Being addressed as diet advanced, supplement  GOAL:   Patient will meet greater than or equal to 90% of their needs  Progressing  MONITOR:   PO intake, Supplement acceptance, Labs, Weight trends  REASON FOR ASSESSMENT:   Ventilator, Consult Enteral/tube feeding initiation and management  ASSESSMENT:   58 yo female presented with of cough, fever, chills, dyspnea.  Found to have hypoxia with interstitial infiltrates requiring intubation. Pt with acute on chronic hypoxic/hypercapnic respiratory failure from Influenza B pneumonia.  3/18 Extubated  Pt with good appetite, tolerating po, ate 100% at breakfast this AM  Sodium 152;  pt with estimated free water deficit of 5-6 L  Weight down to 111.1 kg; admission wt 122.5 kg. Net negative 6.6 L since admission per I/O flow sheet; noted multiple unmeasured urine occurrences as well  Labs: sodium 152 (H), Creatinine wdl, BUN 44 Meds: MVI   Diet Order:   Diet Order            Diet heart healthy/carb modified Room service appropriate? Yes; Fluid consistency: Thin  Diet effective now              EDUCATION NEEDS:   Not appropriate for education at this time  Skin:  Skin Assessment: Skin Integrity Issues:(no pressure injuries noted)  Last BM:  3/19  Height:   Ht Readings from Last 1 Encounters:  11/28/18 4\' 10"  (1.473 m)    Weight:   Wt Readings from Last 1 Encounters:  12/11/18 111.1 kg    Ideal Body Weight:  43.76 kg  BMI:  Body mass index is 51.19 kg/m.  Estimated Nutritional Needs:   Kcal:  1800-2000 kcals   Protein:   90-110 g   Fluid:  >/= 1.8 L  Romelle Starcher MS, RD, LDN, CNSC [phone] Pager  [phone] Weekend/On-Call Pager

## 2018-12-12 DIAGNOSIS — I5021 Acute systolic (congestive) heart failure: Secondary | ICD-10-CM

## 2018-12-12 LAB — BASIC METABOLIC PANEL
Anion gap: 5 (ref 5–15)
BUN: 31 mg/dL — ABNORMAL HIGH (ref 6–20)
CO2: 27 mmol/L (ref 22–32)
CREATININE: 0.73 mg/dL (ref 0.44–1.00)
Calcium: 9.2 mg/dL (ref 8.9–10.3)
Chloride: 112 mmol/L — ABNORMAL HIGH (ref 98–111)
GFR calc Af Amer: >60 mL/min (ref >60)
Glucose, Bld: 117 mg/dL — ABNORMAL HIGH (ref 70–99)
Potassium: 3.2 mmol/L — ABNORMAL LOW (ref 3.5–5.1)
Sodium: 144 mmol/L (ref 135–145)

## 2018-12-12 LAB — PHOSPHORUS
PHOSPHORUS: 2.7 mg/dL (ref 2.5–4.6)
Phosphorus: 3.6 mg/dL (ref 2.5–4.6)

## 2018-12-12 LAB — GLUCOSE, CAPILLARY
GLUCOSE-CAPILLARY: 126 mg/dL — AB (ref 70–99)
Glucose-Capillary: 100 mg/dL — ABNORMAL HIGH (ref 70–99)
Glucose-Capillary: 106 mg/dL — ABNORMAL HIGH (ref 70–99)
Glucose-Capillary: 124 mg/dL — ABNORMAL HIGH (ref 70–99)
Glucose-Capillary: 155 mg/dL — ABNORMAL HIGH (ref 70–99)
Glucose-Capillary: 95 mg/dL (ref 70–99)

## 2018-12-12 LAB — MAGNESIUM
Magnesium: 2.3 mg/dL (ref 1.7–2.4)
Magnesium: 2.4 mg/dL (ref 1.7–2.4)

## 2018-12-12 MED ORDER — POTASSIUM CHLORIDE 20 MEQ PO PACK
40.0000 meq | PACK | Freq: Once | ORAL | Status: AC
  Administered 2018-12-12: 40 meq via ORAL
  Filled 2018-12-12: qty 2

## 2018-12-12 MED ORDER — IPRATROPIUM-ALBUTEROL 0.5-2.5 (3) MG/3ML IN SOLN: 3.0000 mL | Freq: Four times a day (QID) | RESPIRATORY_TRACT | Status: DC | PRN

## 2018-12-12 NOTE — Progress Notes (Signed)
PROGRESS NOTE    Veronica Castaneda  GNO:[id number]  DOB: [birth year]  DOA: 11/28/2018 PCP: Patient, No Pcp Per  Brief Narrative:   58 yo Spanish speaking female presented with 3 days of cough, fever, chills, dyspnea.  Found to have hypoxia with interstitial infiltrates.  Pneumococcal antigen and Legionella antigen were negative but Flu B was positive  Required intubation and admitted to ICU on March 6. Echocardiogram in this admission showed EF of 40 to 45%.  Patient received IV diuresis during ICU stay but subsequently held in view of AKI.  Subjective:  Patient working with PT this morning.  Denies any chest pain. 02 requirement down to 4 L O2 nasal cannula at rest and possibly 6 lits on exertion.  Spanish interpreter bedside and helped with conversation.  Objective: Vitals:   12/11/18 2324 12/12/18 0231 12/12/18 0419 12/12/18 0722  BP: (!) 128/92  132/80 115/89  Pulse: 92  85 81  Resp: (!) 26  20 (!) 24  Temp: 98.2 F (36.8 C)  98.5 F (36.9 C) 98.7 F (37.1 C)  TempSrc: Oral  Oral   SpO2: 93% 95% 94% 97%  Weight:   111.4 kg   Height:        Intake/Output Summary (Last 24 hours) at 12/12/2018 1039 Last data filed at 12/12/2018 0640 Gross per 24 hour  Intake 1280 ml  Output 550 ml  Net 730 ml   Filed Weights   12/10/18 0500 12/11/18 0500 12/12/18 0419  Weight: 115.2 kg 111.1 kg 111.4 kg    Physical Examination:  General exam: Appears calm and comfortable  Respiratory system: Clear to auscultation. Respiratory effort normal. Cardiovascular system: S1 & S2 heard, RRR. No JVD, murmurs, rubs, gallops or clicks.  Trace pitting pedal edema. Gastrointestinal system: Abdomen is nondistended, soft and nontender. No organomegaly or masses felt. Normal bowel sounds heard. Central nervous system: Alert and oriented. No focal neurological deficits. Extremities: Symmetric 5 x 5 power. Skin: No rashes, lesions or ulcers Psychiatry: Judgement and insight appear normal.  Mood & affect appropriate.     Data Reviewed: I have personally reviewed following labs and imaging studies  CBC: Recent Labs  Lab 12/06/18 0439 12/07/18 0344 12/07/18 0500 12/08/18 0336 12/08/18 0423 12/09/18 0325 12/11/18 0723  WBC 12.9* 15.7*  --  13.0*  --  9.7 7.4  HGB 15.6* 15.3* 16.0* 15.2* 16.0* 14.7 13.3  HCT 52.5* 52.2* 47.0* 52.1* 47.0* 50.3* 46.0  MCV 100.0 99.6  --  99.4  --  100.6* 98.7  PLT 227 232  --  249  --  238 249   Basic Metabolic Panel: Recent Labs  Lab 12/09/18 0325  12/09/18 2007 12/10/18 0450 12/10/18 1652 12/11/18 0723 12/11/18 1648 12/12/18 0236  NA 157*  --  156* 157*  --  152*  --  144  K 3.3*  --  3.5 3.9  --  3.5  --  3.2*  CL 117*  --  119* 117*  --  111  --  112*  CO2 28  --  29 32  --  28  --  27  GLUCOSE 185*  --  160* 174*  --  120*  --  117*  BUN 72*  --  57* 52*  --  44*  --  31*  CREATININE 0.93  --  0.92 0.94  --  0.78  --  0.73  CALCIUM 9.8  --  9.6 9.7  --  9.5  --  9.2  MG 2.9*   < >  --  2.7* 2.5* 2.4 2.3 2.3  PHOS 3.1   < >  --  3.2 3.3 3.8 3.3 3.6   < > = values in this interval not displayed.   GFR: Estimated Creatinine Clearance: 86.7 mL/min (by C-G formula based on SCr of 0.73 mg/dL). Liver Function Tests: No results for input(s): AST, ALT, ALKPHOS, BILITOT, PROT, ALBUMIN in the last 168 hours. No results for input(s): LIPASE, AMYLASE in the last 168 hours. No results for input(s): AMMONIA in the last 168 hours. Coagulation Profile: No results for input(s): INR, PROTIME in the last 168 hours. Cardiac Enzymes: No results for input(s): CKTOTAL, CKMB, CKMBINDEX, TROPONINI in the last 168 hours. BNP (last 3 results) No results for input(s): PROBNP in the last 8760 hours. HbA1C: No results for input(s): HGBA1C in the last 72 hours. CBG: Recent Labs  Lab 12/11/18 1608 12/11/18 2049 12/11/18 2333 12/12/18 0418 12/12/18 0724  GLUCAP 126* 121* 104* 124* 126*   Lipid Profile: No results for input(s): CHOL,  HDL, LDLCALC, TRIG, CHOLHDL, LDLDIRECT in the last 72 hours. Thyroid Function Tests: No results for input(s): TSH, T4TOTAL, FREET4, T3FREE, THYROIDAB in the last 72 hours. Anemia Panel: No results for input(s): VITAMINB12, FOLATE, FERRITIN, TIBC, IRON, RETICCTPCT in the last 72 hours. Sepsis Labs: No results for input(s): PROCALCITON, LATICACIDVEN in the last 168 hours.  No results found for this or any previous visit (from the past 240 hour(s)).    Radiology Studies: No results found.      Scheduled Meds: . docusate  100 mg Per Tube BID  . feeding supplement (ENSURE ENLIVE)  237 mL Oral BID BM  . heparin  5,000 Units Subcutaneous Q8H  . insulin aspart  0-9 Units Subcutaneous Q4H  . mouth rinse  15 mL Mouth Rinse BID  . multivitamin with minerals  1 tablet Oral Daily  . nystatin   Topical TID  . pantoprazole  40 mg Oral Daily  . sennosides  5 mL Per Tube BID  . sodium chloride flush  3 mL Intravenous Q12H   Continuous Infusions: . sodium chloride      Assessment & Plan:    1.  Acute on chronic hypoxic/hypercapnic respiratory failure secondary to viral pneumonia/systolic CHF with underlying obstructive sleep apnea: Patient required intubation  March 6 to March 18. She tested postive for influenza B on 3/6 but does not appear to have gotten Rx with Tamiflu (?out of window) Patient completed empiric antibiotic course with Rocephin.  Chest x-ray on March 17 showed bilateral atelectasis.  Diuretics on hold in concern for AKI and hypernatremia.  Extubated on March 18.  Currently saturating well on 4 L of nasal cannula.  Seen by speech therapy and cleared for regular diet  2.  Acute on chronic systolic CHF: Received aggressive diuresis during ICU stay.  Currently diuretics on hold in concern for problem #3.  Showed EF of 40 to 45%  3.  AKI//hypernatremia/hypokalemia: In the setting of overdiuresis.  Diuretics on hold.  Sodium improving and wnl today.  Ordered potassium  supplementation  4.  Constipation: Continue laxatives as needed  5.  Deconditioning: Physical therapy to follow-up and recommend  DVT prophylaxis: Heparin Code Status: Partial code.  No CPR/cardioversion Family / Patient Communication: Discussed with patient and daughter bedside with the help of Spanish interpreter Disposition Plan: Possibly home in next 48 hours with or without home 02. Downgrade to telemetry    LOS: 14 days    Time spent: 25 minutes    Jedadiah Abdallah  Lajuana Ripple, MD [facility] Pager [phone]  If 7PM-7AM, please contact night-coverage [email or web address] Password [facility] 12/12/2018, 10:39 AM

## 2018-12-12 NOTE — Progress Notes (Signed)
Physical Therapy Treatment Patient Details Name: Veronica Castaneda MRN: [id number] DOB: 1961/09/22 Today's Date: 12/12/2018    History of Present Illness Pt is a 58 year old woman admitted 11/28/18 with acute hypoxic respiratory failure with pulmonary edema 2/2 flu B PNA. ETT 3/6-3/18.     PT Comments    Patient progressing with PT, now ambulating short distances, VSS on 4L this visit. Will cont to progress for HHPT.   Follow Up Recommendations  Home health PT;Supervision/Assistance - 24 hour     Equipment Recommendations  (TBD)    Recommendations for Other Services       Precautions / Restrictions Precautions Precautions: Fall Restrictions Weight Bearing Restrictions: No    Mobility  Bed Mobility Overal bed mobility: Modified Independent                Transfers Overall transfer level: Needs assistance Equipment used: Rolling walker (2 wheeled) Transfers: Sit to/from Stand Sit to Stand: Min guard            Ambulation/Gait Ambulation/Gait assistance: Min Emergency planning/management officer (Feet): 25 Feet Assistive device: Rolling walker (2 wheeled) Gait Pattern/deviations: Step-to pattern Gait velocity: decreased   General Gait Details: guard to supervision for gait, VSS on 4L. mild unsteadiness with RW   Stairs             Wheelchair Mobility    Modified Rankin (Stroke Patients Only)       Balance Overall balance assessment: Needs assistance   Sitting balance-Leahy Scale: Fair       Standing balance-Leahy Scale: Fair                              Cognition Arousal/Alertness: Awake/alert Behavior During Therapy: WFL for tasks assessed/performed Overall Cognitive Status: Within Functional Limits for tasks assessed                                        Exercises      General Comments        Pertinent Vitals/Pain Pain Assessment: No/denies pain    Home Living                       Prior Function            PT Goals (current goals can now be found in the care plan section) Acute Rehab PT Goals Patient Stated Goal: to return home with family PT Goal Formulation: With patient/family Time For Goal Achievement: 12/25/18 Potential to Achieve Goals: Good Progress towards PT goals: Progressing toward goals    Frequency    Min 3X/week      PT Plan Current plan remains appropriate    Co-evaluation              AM-PAC PT "6 Clicks" Mobility   Outcome Measure  Help needed turning from your back to your side while in a flat bed without using bedrails?: None Help needed moving from lying on your back to sitting on the side of a flat bed without using bedrails?: A Little Help needed moving to and from a bed to a chair (including a wheelchair)?: A Little Help needed standing up from a chair using your arms (e.g., wheelchair or bedside chair)?: A Lot Help needed to walk in hospital room?: A Lot Help needed climbing 3-5 steps with  a railing? : Total 6 Click Score: 15    End of Session Equipment Utilized During Treatment: Gait belt Activity Tolerance: Patient tolerated treatment well Patient left: in chair;with call bell/phone within reach Nurse Communication: Mobility status PT Visit Diagnosis: Unsteadiness on feet (R26.81)     Time: 1720-1740 PT Time Calculation (min) (ACUTE ONLY): 20 min  Charges:  $Gait Training: 8-22 mins            Etta Grandchild, PT, DPT Acute Rehabilitation Services Pager: [phone] Office: [phone]     Etta Grandchild 12/12/2018, 10:41 PM

## 2018-12-12 NOTE — Progress Notes (Signed)
Occupational Therapy Treatment Patient Details Name: Veronica Castaneda MRN: [id number] DOB: May 14, 1961 Today's Date: 12/12/2018    History of present illness Pt is a 58 year old woman admitted 11/28/18 with acute hypoxic respiratory failure with pulmonary edema 2/2 flu B PNA. ETT 3/6-3/18.    OT comments  Pt performing bed mobility with minA for trunk elevation from releasing hand rail. Pt performing ADl functional mobility in room with RW and SpO2 levels 90% on 4L O2 and 6L O2 upon exertion. Pt performing transfers with minguardA. Pt performing grooming tasks standing at sink with RW in front x3 mins. Pt tolerating session well. Pt would benefit from continued OT skilled services for ADL, mobility and safety in HHOT setting. OT to follow acutely. Graciella interpreter present.      Follow Up Recommendations  Home health OT;Supervision/Assistance - 24 hour    Equipment Recommendations  Tub/shower seat    Recommendations for Other Services      Precautions / Restrictions Precautions Precautions: Fall Restrictions Weight Bearing Restrictions: No       Mobility Bed Mobility Overal bed mobility: Needs Assistance Bed Mobility: Supine to Sit     Supine to sit: Min assist        Transfers Overall transfer level: Needs assistance Equipment used: Rolling walker (2 wheeled) Transfers: Sit to/from Stand Sit to Stand: Min assist         General transfer comment: requires assist for safety and stabilization    Balance Overall balance assessment: Needs assistance   Sitting balance-Leahy Scale: Fair       Standing balance-Leahy Scale: Fair                             ADL either performed or assessed with clinical judgement   ADL Overall ADL's : Needs assistance/impaired Eating/Feeding: Independent;Sitting                                   Functional mobility during ADLs: Min guard;Rolling walker General ADL Comments: Pt requires  assist with LB ADL due to body habitus and poor endurance from intubation.     Vision   Vision Assessment?: No apparent visual deficits   Perception     Praxis      Cognition Arousal/Alertness: Awake/alert Behavior During Therapy: WFL for tasks assessed/performed Overall Cognitive Status: Within Functional Limits for tasks assessed                                          Exercises     Shoulder Instructions       General Comments Pt performing ADL functional mobility with 4L SpO2 levels 90% on rest; 6L o2 90% upon exertion.    Pertinent Vitals/ Pain       Pain Assessment: No/denies pain  Home Living                                          Prior Functioning/Environment              Frequency  Min 2X/week        Progress Toward Goals  OT Goals(current goals can now be found in the care plan  section)  Progress towards OT goals: Progressing toward goals  Acute Rehab OT Goals Patient Stated Goal: to return home with family OT Goal Formulation: With patient Time For Goal Achievement: 12/25/18 Potential to Achieve Goals: Good ADL Goals Pt Will Perform Grooming: with supervision;standing Pt Will Perform Upper Body Dressing: with set-up;sitting Pt Will Perform Lower Body Dressing: with supervision;sit to/from stand Pt Will Transfer to Toilet: with supervision;ambulating;regular height toilet Pt Will Perform Toileting - Clothing Manipulation and hygiene: with supervision;sit to/from stand Pt Will Perform Tub/Shower Transfer: with min assist;ambulating;shower seat;rolling walker Additional ADL Goal #1: Pt will employ energy conservation strategies during ADL and mobility independently.  Plan Discharge plan remains appropriate    Co-evaluation                 AM-PAC OT "6 Clicks" Daily Activity     Outcome Measure   Help from another person eating meals?: None Help from another person taking care of personal  grooming?: A Little Help from another person toileting, which includes using toliet, bedpan, or urinal?: A Lot Help from another person bathing (including washing, rinsing, drying)?: A Lot Help from another person to put on and taking off regular upper body clothing?: A Little Help from another person to put on and taking off regular lower body clothing?: A Lot 6 Click Score: 16    End of Session Equipment Utilized During Treatment: Rolling walker;Gait belt;Oxygen  OT Visit Diagnosis: Unsteadiness on feet (R26.81);Other abnormalities of gait and mobility (R26.89);Muscle weakness (generalized) (M62.81)   Activity Tolerance Patient limited by fatigue;Patient tolerated treatment well   Patient Left in chair;with call bell/phone within reach;with family/visitor present   Nurse Communication Mobility status        Time: 9201-0071 OT Time Calculation (min): 45 min  Charges: OT General Charges $OT Visit: 1 Visit OT Treatments $Self Care/Home Management : 8-22 mins $Neuromuscular Re-education: 23-37 mins  Cristi Loron) Glendell Docker OTR/L Acute Rehabilitation Services Pager: [phone] Office: [phone]    Sandrea Hughs 12/12/2018, 2:27 PM

## 2018-12-13 ENCOUNTER — Inpatient Hospital Stay (HOSPITAL_COMMUNITY): Payer: Self-pay

## 2018-12-13 DIAGNOSIS — J9600 Acute respiratory failure, unspecified whether with hypoxia or hypercapnia: Secondary | ICD-10-CM

## 2018-12-13 LAB — ECHOCARDIOGRAM LIMITED
Height: 58 in
Weight: 3985.92 oz

## 2018-12-13 LAB — BASIC METABOLIC PANEL
Anion gap: 9 (ref 5–15)
BUN: 17 mg/dL (ref 6–20)
CO2: 28 mmol/L (ref 22–32)
Calcium: 9 mg/dL (ref 8.9–10.3)
Chloride: 106 mmol/L (ref 98–111)
Creatinine, Ser: 0.7 mg/dL (ref 0.44–1.00)
GFR calc Af Amer: >60 mL/min (ref >60)
GFR calc non Af Amer: >60 mL/min (ref >60)
Glucose, Bld: 119 mg/dL — ABNORMAL HIGH (ref 70–99)
Potassium: 3.6 mmol/L (ref 3.5–5.1)
Sodium: 143 mmol/L (ref 135–145)

## 2018-12-13 LAB — GLUCOSE, CAPILLARY
GLUCOSE-CAPILLARY: 108 mg/dL — AB (ref 70–99)
Glucose-Capillary: 116 mg/dL — ABNORMAL HIGH (ref 70–99)
Glucose-Capillary: 116 mg/dL — ABNORMAL HIGH (ref 70–99)
Glucose-Capillary: 94 mg/dL (ref 70–99)
Glucose-Capillary: 89 mg/dL (ref 70–99)

## 2018-12-13 LAB — PHOSPHORUS
Phosphorus: 2.9 mg/dL (ref 2.5–4.6)
Phosphorus: 2.7 mg/dL (ref 2.5–4.6)

## 2018-12-13 LAB — MAGNESIUM
Magnesium: 2.2 mg/dL (ref 1.7–2.4)
Magnesium: 2.2 mg/dL (ref 1.7–2.4)

## 2018-12-13 MED ORDER — POTASSIUM CHLORIDE 20 MEQ/15ML (10%) PO SOLN
40.0000 meq | Freq: Two times a day (BID) | ORAL | Status: DC
  Administered 2018-12-13: 40 meq via ORAL
  Filled 2018-12-13: qty 30

## 2018-12-13 MED ORDER — FUROSEMIDE 40 MG PO TABS: 40.0000 mg | ORAL_TABLET | Freq: Every day | ORAL | 0 refills | Status: DC

## 2018-12-13 MED ORDER — POLYETHYLENE GLYCOL 3350 17 G PO PACK: 17.0000 g | PACK | Freq: Every day | ORAL | Status: DC

## 2018-12-13 MED ORDER — POTASSIUM CHLORIDE CRYS ER 20 MEQ PO TBCR
20.0000 meq | EXTENDED_RELEASE_TABLET | Freq: Every day | ORAL | Status: DC
  Administered 2018-12-13 – 2018-12-14 (×2): 20 meq via ORAL
  Filled 2018-12-13 (×2): qty 1

## 2018-12-13 MED ORDER — PANTOPRAZOLE SODIUM 40 MG PO TBEC: 40.0000 mg | DELAYED_RELEASE_TABLET | Freq: Every day | ORAL | 0 refills | Status: DC

## 2018-12-13 MED ORDER — ADULT MULTIVITAMIN W/MINERALS CH: 1.0000 | ORAL_TABLET | Freq: Every day | ORAL | 0 refills | Status: DC

## 2018-12-13 MED ORDER — POTASSIUM CHLORIDE CRYS ER 20 MEQ PO TBCR: 20.0000 meq | EXTENDED_RELEASE_TABLET | Freq: Every day | ORAL | 0 refills | Status: DC

## 2018-12-13 MED ORDER — FUROSEMIDE 40 MG PO TABS
40.0000 mg | ORAL_TABLET | Freq: Every day | ORAL | Status: DC
  Administered 2018-12-13 – 2018-12-14 (×2): 40 mg via ORAL
  Filled 2018-12-13 (×2): qty 1

## 2018-12-13 MED ORDER — POLYETHYLENE GLYCOL 3350 17 G PO PACK: 17.0000 g | PACK | Freq: Every day | ORAL | 0 refills | Status: DC | PRN

## 2018-12-13 NOTE — Progress Notes (Signed)
Follow up echo shows normalization of LV function. Suspect transient LV dysfunction due to acute illness.  Will arrange OP telemedicine follow up. Thurmon Fair, MD, [facility] [phone] office [phone] pager

## 2018-12-13 NOTE — Discharge Instructions (Signed)
Insuficiencia respiratoria aguda en los adultos (Acute Respiratory Failure, Adult) La insuficiencia respiratoria aguda ocurre cuando no pasa suficiente oxgeno de los pulmones al cuerpo. Cuando esto sucede, los pulmones tienen dificultad para eliminar el dixido de carbono de la [place]. A medida que el dixido de carbono se acumula, el nivel de oxgeno en la sangre disminuye considerablemente. La insuficiencia respiratoria aguda es una emergencia mdica. Puede aparecer rpidamente, pero es temporaria si se trata pronto. La capacidad pulmonar, o cunto aire pueden Advanced Micro Devicescontener los pulmones, puede mejorar con el Meridian Hillstiempo, el ejercicio y Scientist, research (medical)el tratamiento. CAUSAS Hay muchas causas posibles de la insuficiencia respiratoria aguda, entre ellas:  Lesin pulmonar.  Lesin torcica o dao en las costillas o los tejidos cercanos a los pulmones.  Afecciones pulmonares que afectan el flujo de aire y Cyprussangre hacia y desde los pulmones, como la neumona, Oregonel sndrome de dificultad respiratoria aguda y la fibrosis Cayman Islandsqustica.  Afecciones mdicas, como ictus y lesiones en la mdula espinal, que afectan los msculos y los nervios que controlan la respiracin.  Infeccin en la sangre (sepsis).  Inflamacin del pncreas (pancreatitis).  Un cogulo de Rite Aidsangre en los pulmones (embolia pulmonar).  Una transfusin de sangre de gran volumen.  Quemaduras.  Ahogamiento inminente.  Convulsiones.  Inhalacin de humo.  Reacciones a medicamentos.  Sobredosis de alcohol o drogas. FACTORES DE RIESGO Es ms probable que esta afeccin se manifieste en las personas que tienen:  Una obstruccin en las vas respiratorias.  Asma.  Una afeccin o una enfermedad que daa o Consolidated Edisondebilita los msculos, los nervios, los huesos o los tejidos que participan en la respiracin.  Una infeccin grave.  Un problema de salud que bloquea el reflejo inconsciente que est involucrado en la respiracin, como el hipotiroidismo o la apnea del  sueo.  Un traumatismo o lesin pulmonar. SNTOMAS La dificultad para respirar es el sntoma principal de la insuficiencia respiratoria aguda. Otros sntomas son:  Respiracin rpida.  Ansiedad o inquietud.  Piel, labios o yemas de los dedos de color azulado (cianosis).  Frecuencia cardaca acelerada.  Ritmo cardaco anormal (arritmia).  Confusin o cambios en la conducta.  Cansancio o prdida de Water engineerla energa.  Somnolencia o prdida de Warden/rangerla consciencia. DIAGNSTICO El mdico puede diagnosticar insuficiencia respiratoria aguda en funcin de su historia clnica y de un examen fsico. Durante el examen, el mdico escuchar el corazn y tratar de Engineer, manufacturingdetectar sonidos crepitantes o sibilancias en los pulmones. Tambin pueden hacerle estudios para confirmar el diagnstico y Chief Strategy Officerdeterminar qu est produciendo la insuficiencia respiratoria. Estos estudios pueden Johnson & Johnsonincluir los siguientes:  Medicin de la cantidad de oxgeno en la sangre (oximetra de pulso). La medicin se obtiene con un dispositivo pequeo que se coloca en el dedo, el lbulo o un dedo del pie.  Otros anlisis de sangre para medir los gases en la sangre y para buscar signos de infeccin.  Toma de muestras del lquido cefalorraqudeo o del lquido traqueal para Engineer, manufacturingdetectar infecciones.  Radiografa de trax para buscar lquidos en espacios que deberan estar llenos de aire.  Electrocardiograma (ECG) para observar la actividad elctrica del corazn. TRATAMIENTO El tratamiento para esta afeccin suele realizarse en la unidad de cuidados intensivos (UCI) de un hospital. El tratamiento depende de la causa. Se pueden necesitar uno o ms tratamientos para que los sntomas mejoren. El tratamiento puede incluir lo siguiente:  Oxgeno complementario. Para administrar oxgeno extra, se [place] ([place]) Islandsutiliza un tubo en la Darene Lamernariz, una mascarilla o una campana de oxgeno.  Un dispositivo como una mquina de presin  positiva continua (CPAP) o de presin positiva de  dos niveles (BiPAP o BPAP) en las vas respiratorias. Este tratamiento utiliza una presin de aire leve para Pharmacologist las vas respiratorias abiertas. Se le colocar Primary school teacher u otro dispositivo en la nariz o la boca. Un tuvo conectado a TEFL teacher oxgeno a travs de Nurse, adult.  Respirador. Este tratamiento ayuda a transportar aire desde y [employer]. Esto se puede realizar con Neomia Dear bolsa y [address] o [place]. Para este tratamiento, se coloca un tubo en la trquea para que el aire y el oxgeno Geophysicist/field seismologist a los pulmones.  Oxigenacin por membrana extracorprea (OMEC). Este tratamiento asume la funcin del corazn y los pulmones, de forma temporaria, para suministrar oxgeno y Pharmacologist el dixido de carbono. A travs de la OMEC, los pulmones tienen la posibilidad de recuperarse. Este tratamiento se puede utilizar cuando el ventilador no es [place].  Traqueostoma. En este procedimiento se hace un orificio en el cuello en el que se inserta un tubo para que pueda respirar.  Recibir lquidos y [employer].  Mecer la cama para ayudar a Industrial/product designer. INSTRUCCIONES PARA EL CUIDADO EN EL HOGAR  Tome los medicamentos de venta libre y los recetados solamente como se lo haya indicado el mdico.  Retome sus actividades normales como se lo haya indicado el mdico. Pregntele al mdico qu actividades son seguras para usted.  Concurra a todas las visitas de control como se lo haya indicado el mdico. Esto es importante. PREVENCIN Tratar infecciones y afecciones mdicas que pueden producir insuficiencia respiratoria aguda puede contribuir a IT sales professional de esta afeccin. SOLICITE ATENCIN MDICA SI:  Lance Muss.  Los sntomas no mejoran o empeoran. SOLICITE ATENCIN MDICA DE INMEDIATO SI:  Presenta dificultades respiratorias sbitas.  Pierde la conciencia.  Tiene cianosis o se torna de un color azulado.  Se le acelera la frecuencia cardaca.  Se  siente confundido. Estos sntomas pueden representar un problema grave que constituye Radio broadcast assistant. No espere hasta que los sntomas desaparezcan. Solicite atencin mdica de inmediato. Comunquese con el servicio de emergencias de su localidad (911 en los [place]). No conduzca por sus propios medios [employer]. Esta informacin no tiene Theme park manager el consejo del mdico. Asegrese de hacerle al mdico cualquier pregunta que tenga. Document Released: 09/15/2013 Document Revised: 06/01/2015 Document Reviewed: 03/28/2016 Elsevier Interactive Patient Education  2019 Elsevier Inc.   Insuficiencia cardaca Heart Failure  Insuficiencia cardaca significa que el corazn tiene problemas para bombear la [place]. Esto dificulta el buen funcionamiento del organismo. La insuficiencia cardaca por lo general es una enfermedad a largo plazo (crnica). Es importante que se cuide mucho y que siga el plan de tratamiento que le indique su mdico. Siga estas indicaciones en su casa: Medicamentos  Tome los medicamentos de venta libre y los recetados solamente como se lo haya indicado el mdico. ? No deje de [employer], a menos que se lo indique su mdico. ? No omita ninguna dosis. ? Pida una nueva receta antes de quedarse sin medicamentos. Es necesario que tome sus medicamentos todos [place]. Comida y bebida   Consuma alimentos cardiosaludables. Hable con un especialista en nutricin y dieta (nutricionista) para crear un plan de alimentacin.  Elija alimentos que: ? No contengan grasas trans. ? Tengan un bajo contenido de grasas saturadas y colesterol.  Elija alimentos saludables, como: ? Nils Pyle y verduras frescas o congeladas. ? Pescado. ? Carnes bajas en grasa Nonah Mattes). ? Legumbres Reliant Energy,  guisantes y frijoles). ? Productos lcteos con bajo contenido de [place] o sin grasa. ? Alimentos de Designer, television/film set. ? Alimentos ricos Th[employer] Financial.  Limite el consumo de sal  (sodio) si se lo indica su mdico. Pdale a su especialista en nutricin que le recomiende condimentos cardiosaludables.  Use formas saludables de cocinar los alimentos en lugar de frerlos. Formas saludables de cocinar incluyen: ? Asar. ? Grillar. ? Hervir. ? Hornear. ? Escalfar. ? Cocer al vapor. ? Saltear.  Limite la cantidad de lquido que bebe, si el mdico se lo indic. Estilo de vida  No fume ni use tabaco de mascar. No utilice goma de Theatre manager ni parches de nicotina sin consultar a su mdico.  Limite el consumo de alcohol a no ms de 1 medida por da si es mujer y no est Orthoptist, y 2 medidas por da si es hombre. Una medida equivale a 12oz ( ) de cerveza, 5oz ( ) de vino o 1oz (69ml) de bebidas alcohlicas de alta graduacin. ? Dgale a su mdico si usted bebe alcohol varias veces por semana. ? Hable con su mdico acerca de si beber alcohol es seguro para usted. ? Debera dejar de beber alcohol si: ? El alcohol da su corazn. ? Tiene insuficiencia cardaca grave.  No consuma drogas.  Pierda peso si el mdico se lo indic.  Haga actividad fsica moderada si el mdico se lo indic. Pregntele al mdico qu actividades son seguras para usted si: ? Usted es una persona mayor (anciana). ? Tiene insuficiencia cardaca grave. Lleve un registro de la informacin importante  Controle su peso a diario. ? Hgalo todas las maanas despus de hacer pis (orinar) y antes de Engineer, maintenance. ? Use la misma cantidad de ropa cada vez que se pese. ? Anote su peso diario. Entrguele su registro al [employer] Express.  Controle y anote su presin arterial tal como le indic el mdico.  Controle su pulso tal como le indic el mdico. Recomendaciones para cuando hace mucho calor o mucho fro  Si hace mucho calor: ? Evite las actividades que requieran Copywriter, advertising. ? Utilice el aire acondicionado o ventiladores, o busque un lugar ms fresco. ? Evite la cafena. ? Evite el  alcohol. ? Use ropa que sea holgada, ligera y de colores claros.  Si hace mucho fro: ? Evite las actividades que requieran Pitney Bowes. ? Vstase con diferentes capas de ropa. ? Use mitones o guantes, un sombrero y [place] bufanda cuando salga. ? Evite el alcohol. Instrucciones generales  Controle otras afecciones que tenga como se lo haya indicado el mdico.  Aprenda a Dealer. Si necesita ayuda, consulte al mdico.  Planifique perodos de descanso para cuando se canse.  Obtenga educacin y apoyo segn sea necesario.  Haga rehabilitacin para poder mantenerse independiente y ayudar con las tareas cotidianas.  Mantngase al da con vacunas (inmunizaciones), especialmente las vacunas contra la influenza (gripe) y el neumococo.  Concurra a todas las visitas de seguimiento como se lo haya indicado el mdico. Esto es importante. Comunquese con un mdico si:  Aumenta de peso rpidamente.  Le falta el aire ms que lo normal.  No puede hacer sus actividades habituales.  Se cansa con facilidad.  Tose ms de lo normal, especialmente al realizar actividad fsica.  Presenta inflamacin (hinchazn) en reas como las manos, los pies, los tobillos o el vientre (abdomen).  Le cuesta dormir debido a que Engineer, manufacturing systems.  Siente que el corazn late rpidamente (palpitaciones).  Siente mareos o vahdos  cuando se pone de pie. Solicite ayuda de inmediato si:  Tiene dificultad para respirar.  Usted u otra persona advierten un cambio en su estado de alerta. Este cambio podra ser un problema para mantenerse despierto o para concentrarse.  Siente dolor u opresin en el pecho.  Pierde el conocimiento (se desmaya). Resumen  Insuficiencia cardaca significa que el corazn tiene problemas para bombear la [place].  Asegrese de pedir una nueva receta antes de quedarse sin medicamentos. Es necesario que tome sus medicamentos todos [place].  Lleve un registro de su  peso y su presin arterial para entregrselo a su mdico.  Contacte a un mdico si aumenta de peso rpidamente. Esta informacin no tiene Theme park manager el consejo del mdico. Asegrese de hacerle al mdico cualquier pregunta que tenga. Document Released: 12/07/2008 Document Revised: 05/06/2017 Document Reviewed: 10/27/2012 Elsevier Interactive Patient Education  2019 ArvinMeritor.   Plan de accin para controlar la insuficiencia cardaca Heart Failure Action Plan Un plan de accin para controlar la insuficiencia cardaca lo ayuda a comprender qu hacer cuando tiene sntomas de esta afeccin. Siga el plan que usted y Arts development officer. Revise el plan con el mdico en todas las consultas. Zona roja Estos signos y sntomas significan que debe obtener atencin mdica de inmediato:  Tiene dificultad para respirar cuando descansa.  Tiene tos seca, y [place].  Tiene dolor o hinchazn en las piernas o el abdomen, y [place].  De repente, aumenta ms de 2 a 3lb (0,9 a 1,4kg) por da o ms de 5lb (2,3kg) en una sola semana. Esta cantidad podra ser mayor o menor segn su afeccin.  Se siente confundido o tiene dificultad para mantenerse despierto.  Siente dolor en el pecho.  No tiene apetito.  Se desmaya. Si presenta alguno de estos sntomas:  Llame al servicio de emergencias de su localidad (911 en los [place] de inmediato o busque ayuda en la sala de urgencias del hospital ms cercano. Zona amarilla Estos signos y sntomas significan que su afeccin podra estar empeorando y que debe hacer algunos cambios:  Tiene dificultad para respirar cuando est activo o necesita una mayor cantidad de almohadas para dormir.  Tiene hinchazn en las piernas o el abdomen.  Aumenta de 2 a 3lb (0,9 a 1,4kg) por da o 5lb (2,3kg) en una sola semana. Esta cantidad podra ser mayor o menor segn su afeccin.  Se cansa con facilidad.  Tiene dificultad para dormir.  Tiene  tos seca. Si presenta alguno de estos sntomas:  Comunquese con Sports coach siguiente.  El mdico podra ajustarle los medicamentos. Zona verde Estos signos significan que se encuentra bien y puede continuar del mismo modo:  No tiene Estate manager/land agent.  Tiene poca hinchazn y no empeora.  Tiene un peso estable (ni aumenta ni desciende).  Su nivel de actividad es normal.  No tiene dolor de pecho ni otros sntomas nuevos. Siga estas indicaciones en su casa:  Tome los medicamentos de venta libre y los recetados solamente como se lo haya indicado el mdico.  Psese todos [place]. Su peso ideal es __________lb (__________kg). ? Llame al mdico si aumenta ms de __________lb (__________kg) en un solo da o ms de __________lb (__________kg) en una sola semana.  Siga una dieta cardiosaludable. Trabaje con un especialista en alimentacin y nutricin (nutricionista) para elaborar un plan de alimentacin idneo para usted.  Concurra a todas las visitas de [address] se lo haya indicado el mdico. Esto es importante.  Dnde encontrar ms informacin:  Asociacin Estadounidense del Corazn (American Heart Association): www.heart.org Resumen  Siga el plan de accin que usted y Arts development officer.  Obtenga ayuda de inmediato si observa alguno de los sntomas de la zona roja. Esta informacin no tiene Theme park manager el consejo del mdico. Asegrese de hacerle al mdico cualquier pregunta que tenga. Document Released: 05/06/2017 Document Revised: 08/19/2017 Document Reviewed: 05/06/2017 Elsevier Interactive Patient Education  2019 Arvin[employer]itor.   Plan de alimentacin para la insuficiencia cardaca Heart Failure Eating Plan La insuficiencia cardaca, tambin llamada insuficiencia cardaca congestiva, ocurre cuando el corazn no bombea la sangre lo suficientemente bien como para Patent examiner la necesidad del organismo de sangre rica en oxgeno. La insuficiencia cardaca  es una enfermedad de larga duracin (crnica). Vivir con insuficiencia cardaca puede ser difcil. Sin embargo, seguir las indicaciones del mdico acerca de Pharmacologist un estilo de vida saludable y trabajar con un especialista en alimentacin y nutricin (nutricionista) para elegir los alimentos adecuados puede mejorar los sntomas. Consejos para seguir este plan [place] generales  No consuma ms de 2300mg  de sal (sodio) por da. La cantidad de PPL Corporation recomienden puede ser ms baja, segn su cuadro.  Mantenga un peso corporal saludable, segn las indicaciones. Pregntele al mdico cul es un peso sano para usted. ? Controle su peso todos [place]. ? Trabaje con el mdico y el nutricionista para elaborar un plan que sea adecuado para usted con el objetivo de Geophysical data processor o Pharmacologist su peso actual.  Limite la cantidad de lquido que bebe. Pregntele al mdico o al nutricionista cunto lquido puede beber por Futures trader.  Limite o evite el consumo de alcohol como se lo haya indicado el mdico o el nutricionista. Lea las etiquetas de los alimentos  Verifique en las etiquetas de los alimentos, la cantidad de sodio por porcin. Elija alimentos que contengan menos de 140mg  (miligramos) de sodio por porcin.  Lea las etiquetas de los alimentos para conocer la cantidad de caloras por porcin. Esto es importante si debe limitar la ingesta diaria de caloras para Geophysical data processor.  Consulte las etiquetas de los alimentos para conocer el tamao de las porciones. Si come ms de una porcin, consumir ms sodio y caloras que lo indicado en la etiqueta.  Busque los alimentos etiquetados "sin sodio", "muy bajo en sodio" o "bajo en sodio". ? Los alimentos etiquetados "sodio reducido" o "ligeramente salado" pueden contener ms sodio que el recomendado para usted. Coccin  Evite agregar sal cuando cocine. Consulte al mdico o al nutricionista antes de usar sustitutos de la sal.  Condimente la comida con condimentos,  especias o hierbas sin sal. Lea la etiqueta de las mezclas de condimentos para asegurarse de que no contengan sal.  Cocine con aceites cardiosaludables, como oliva, canola, soja o girasol.  No fra los alimentos. A la hora de cocinarlos, utilice mtodos con bajo contenido de [place], como hornearlos, hervirlos, Community education officer y asarlos a Patent attorney.  Limite las grasas poco saludables haciendo lo siguiente: ? Quite la piel a la carne de ave, como el pollo. ? Quite todas las grasas visibles de las carnes. ? Retire la grasa de los guisos, las sopas y las salsas antes de servirlos. Planificacin de las comidas   Limite su consumo de: ? Alimentos procesados, enlatados o empaquetados. ? Alimentos con alto contenido de grasas trans, como los alimentos fritos. ? Dulces, postres, bebidas azucaradas y otros alimentos con azcar agregada. ? Alimentos lcteos enteros, como la [place].  Consuma una  dieta equilibrada, que incluya lo siguiente: ? Entre 4 y 5porciones de fruta [address], y [place] 4 y 5porciones de verduras [employer]. Trate de que la mitad del plato de cada comida sean frutas y verduras. ? Hasta 6 u 8 porciones de cereales integrales por da. ? Hasta 2porciones de carne [place], de ave o de [employer]ot. Una porcin de carne equivale a 3onzas. Esto es casi el mismo tamao que una baraja de cartas. ? Dos porciones de productos lcteos descremados por Futures trader. ? Grasas cardiosaludables. Las grasas saludables llamadas cidos grasos omega-3 se encuentran en alimentos como las semillas de lino y los pescados de agua fra, como la sardina, el salmn y la caballa.  Trate de consumir entre 25 y 35g (gramos) de Firefighter. Los alimentos con alto contenido de fibra incluyen [place], brcoli, zanahorias, frijoles, guisantes y Radiation protection practitioner.  No agregue sal o condimentos que contengan sal (como salsa de soja) a la comida antes de comer.  Cuando coma en un restaurante,  pida que preparen su comida con menos sal o, en lo posible, sin nada de sal.  Trate de comer al menos 2 comidas vegetarianas por semana.  Consuma ms comida casera y menos de restaurante, de buf y comida rpida. Alimentos recomendados Esta podra no ser [employer]on. Hable con el nutricionista sobre las mejores opciones alimenticias para usted. Carbohidratos Pan con menos de 80mg  de sodio por rebanada. Pasta integral, quinua y arroz integral. Avena. Cebada. Mijo. Smola y crema de trigo. Cereal fro integral o de salvado. Verduras Todas las verduras frescas. Las verduras congeladas sin salsa o sal agregada. Verduras enlatadas con bajo contenido de sodio o sin sodio. Frutas Todas las frutas frescas, congeladas y [place]. Frutas secas, como pasas, ciruelas pasa y arndanos. Carnes y otros alimentos ricos en protenas [place] de carne [place]. Pollo y pavo sin piel. Pescados con alto contenido de cidos grasos omega-3, como el salmn, la sardina y otros pescados de agua fra. Huevos. Frijoles secos, guisantes y [place]. Frutos secos y [place] de [place] sin sal. Sheppard Penton Leche o [place] en polvo con bajo contenido de grasa o sin grasa (descremada). Leche de arroz, [place] de soja y [place] de [place]. Yogur semidescremado o descremado. Cantidades pequeas de cubos de queso con cantidad reducida de sodio. Queso cottage con bajo contenido de sodio. Grasas y [address] Aceite de [place], canola, soja, lino o [place]. [place]. Dulces y postres Pur de Geophysicist/field seismologist. Barras de granola. Budines y gelatina sin azcar. Barras de fruta congeladas. Condimentos y otros alimentos Hierbas frescas y secas. Jugo de limn o lima. Vinagre. Ktchup con bajo contenido de [place]. Adobos, aderezos para ensaladas, salsas y condimentos sin sal. Alimentos que se deben evitar Esta podra no ser Raytheon. Hable con el nutricionista sobre las mejores opciones alimenticias para usted. Carbohidratos Pan con no ms de  80mg  de sodio por rebanada. Cereal caliente o fro con ms de 140mg  de sodio por porcin. Pretzels y galletas salados. Pan rallado empaquetado. Bagels, croissants y galletas. Verduras Verduras enlatadas. Verduras congeladas con salsa o condimentos. Verduras con crema. Papas fritas. Anillos de cebollas. Verduras encurtidas y chucrut. Nils Pyle Frutas secas con conservantes que tienen sodio. Carnes y otros alimentos ricos en protenas [place] y [place] de pollo. [place], jamn, pepperoni, boloesa, salame y fiambres empaquetados. Hot dogs, bratwurst y salchichas. Carnes enlatadas. Carnes y [employer]ning. Frutos secos y semillas con sal. Lcteos Leche [place], mezcla de [place] y crema, y crema.  Suero de [place]. Quesos procesados, quesos para untar y [place]. Queso cottage comn. Queso feta. Queso rallado. Queso en hebras. Grasas y [address], [place] de [place], [place], [place] clarificada y Steffanie [place] de tocino. Salsas en lata y envasadas. Condimentos y otros alimentos Sal de cebolla y ajo, sal de mesa y sal marina. Adobos. Aderezos comunes para ensalada. Salsas, pepinillos y [place]. Saborizantes y ablandadores de carne, y cubos de caldo. Rbano picante, ktchup y [place]. Salsa [place]. Salsa teriyaki, salsa de soja (incluso la que tiene contenido reducido de sodio). Salsa picante y [address]. Salsa de carne, salsa de pescado, salsa de [place] y salsa cctel. Aderezos para tacos. Salsa barbacoa. Salsa trtara. Resumen  Un plan de alimentacin para la insuficiencia cardaca incluye la implementacin de cambios para limitar la ingesta de sodio y grasas poco saludables, y puede ayudarlo a Geophysical data processor o a Pharmacologist un peso saludable. El mdico tambin puede recomendarle que limite la cantidad de lquido que toma.  La mayora de las personas con insuficiencia cardaca no deben consumir ms de 2300mg  de sal (sodio) por Futures trader. La cantidad de PPL Corporation recomienden puede ser ms baja,  segn su cuadro.  Comunquese con el mdico o el nutricionista antes de implementar cambios significativos en la alimentacin. Esta informacin no tiene Theme park manager el consejo del mdico. Asegrese de hacerle al mdico cualquier pregunta que tenga. Document Released: 04/16/2017 Document Revised: 04/16/2017 Document Reviewed: 04/16/2017 Elsevier Interactive Patient Education  2019 Arvin[employer]itor.   [place] de la insuficiencia cardaca  Heart Failure Exacerbation  La insuficiencia cardaca es una afeccin por la que el corazn no se llena con la cantidad de sangre suficiente; por lo tanto, no bombea suficiente sangre y oxgeno al resto del cuerpo. Cuando ocurre esto, distintas partes del cuerpo no reciben la sangre y el oxgeno necesarios para funcionar correctamente. Esto puede ocasionar sntomas como problemas respiratorios, cansancio, hinchazn y confusin. La exacerbacin de la insuficiencia cardaca se refiere a que los sntomas de la insuficiencia cardaca empeoran. Los sntomas pueden presentarse repentinamente o desarrollarse lentamente con el tiempo. La exacerbacin de la insuficiencia cardaca es un problema mdico grave que se debe tratar de inmediato. Cules son las causas? Los desencadenantes de una exacerbacin de insuficiencia cardaca pueden ser los siguientes:  No tomar correctamente los medicamentos para la insuficiencia cardaca.  Infecciones.  Ingerir una dieta poco saludable o con alto contenido de sal (sodio).  Beber demasiado lquido.  Consumir alcohol.  Consumir drogas, como cocana o metanfetaminas.  No hacer actividad fsica. Algunas otras causas son las siguientes:  Otras afecciones cardacas como latidos cardacos irregulares (arritmia).  Anemia.  Otras afecciones, como insuficiencia renal. A veces, se desconoce la causa de la exacerbacin. Cules son los signos o los sntomas? Cuando los sntomas de la insuficiencia cardaca [place], ya  sea de [place] repentina o lentamente, puede ser un signo de exacerbacin de la insuficiencia cardaca. Los sntomas de la insuficiencia cardaca incluyen los siguientes:  Problemas respiratorios o falta de [place].  Tos o sibilancias crnicos.  Fatiga.  Nuseas o prdida del apetito.  Sensacin de desvanecimiento.  Confusin o prdida de Scientist, clinical (histocompatibility and immunogenetics).  Aumento de la frecuencia cardaca o latidos cardacos irregulares.  Acumulacin de lquido en las piernas, los tobillos, los pies o el abdomen.  Dificultad para respirar al Javier Glazier. Cmo se diagnostica? Esta afeccin se diagnostica en funcin de lo siguiente:  Los sntomas y antecedentes mdicos.  Un examen fsico. Tambin pueden hacerle exmenes, que incluyen los siguientes:  Materials engineer (  ECG). Este estudio mide la actividad elctrica del corazn.  Ecocardiograma. Este estudio utiliza ondas de sonido para tomar una imagen del corazn y Chief Operating Officer su funcionamiento.  Anlisis de [place].  Pruebas de diagnstico por imgenes, por ejemplo: ? Radiografa de trax. ? Resonancia magntica (RM). ? Ecografa.  Ergometra. Este estudio examina el funcionamiento del corazn durante el ejercicio fsico. Se controlar su corazn mientras hace ejercicio en una cinta para correr o una bicicleta. Si no puede hacer ejercicios, es posible que le administren medicamentos para incrementar los latidos cardacos para Financial controller ejercicio.  Cateterismo cardaco. En este estudio, se inserta un tubo flexible y delgado (catter) en un vaso sanguneo y luego se dirige Special educational needs teacher. Esto permite que el mdico controle las arterias que llegan al corazn (arterias coronarias).  Cateterismo cardaco derecho. Durante este estudio, se mide la presin del corazn. Cmo se trata? El tratamiento de esta afeccin puede incluir lo siguiente:  Modificacin de los medicamentos para Insurance underwriter.  Mantener un estilo de vida saludable. Esto  incluye lo siguiente: ? Consumir una dieta cardiosaludable con bajo contenido de sodio. ? No usar ningn producto que contenga nicotina o tabaco, como cigarrillos y Administrator, Civil Service. ? Haga actividad fsica regularmente. ? Controlar la ingesta de lquidos. ? Controlar su peso e informar al mdico sobre cualquier cambio que experimente.  Tratamiento de la apnea del sueo, en caso de que tenga este trastorno.  Ciruga. Esto puede incluir lo siguiente: ? Implante de un dispositivo que ayuda a que ambos lados del corazn se contraigan al mismo tiempo (dispositivo para terapia de resincronizacin cardaca). Esto puede mejorar la funcin cardaca y [employer] sntomas de la insuficiencia cardaca. ? Implante de un dispositivo que puede corregir los problemas del ritmo cardaco (desfibrilador cardioversor implantable). ? Conexin de un dispositivo al corazn para ayudarlo a Insurance account manager (dispositivo de asistencia ventricular). ? Trasplante de corazn. Siga estas indicaciones en su casa: Medicamentos  [employer] de venta libre y los recetados solamente como se lo haya indicado el mdico.  No deje de tomar sus medicamentos ni cambie la cantidad que toma. Si tiene problemas o experimenta efectos secundarios por los medicamentos, hable con su mdico.  Si tiene dificultad para pagar los medicamentos, comunquese con un trabajador social o su mdico clnico. Existen muchos programas de asistencia para poder comprar los medicamentos.  Hable con el mdico antes de empezar cualquier medicamento o suplemento nuevo.  Asegrese de que su mdico y Administrator, sports tengan una lista de todos los medicamentos que [place]. Comida y bebida   Evite el consumo de alcohol.  Siga una dieta cardiosaludable segn lo indicado por su mdico. Esto incluye lo siguiente: ? Muchas frutas y verduras. ? Protenas magras. ? Productos lcteos descremados. ? Cereales integrales. ? Alimentos con bajo  contenido de [place]. Actividad   Haga actividad fsica habitualmente como se lo haya indicado el mdico. Equilibre la actividad fsica con el descanso.  Pregntele al mdico qu actividades son seguras para usted. Esto incluye actividad sexual, actividad fsica y tareas diarias en la casa o el lugar de [place]. Estilo de vida  No consuma ningn producto que contenga nicotina o tabaco, como cigarrillos y Administrator, Civil Service. Si necesita ayuda para dejar de fumar, consulte al mdico.  Mantenga un peso saludable. Pregunte a su mdico cul es el peso saludable para usted.  Considere la posibilidad de Advertising account planner en un grupo de apoyo para pacientes. Esto puede ayudar con los problemas emocionales que pudiera [place], como estrs  y ansiedad. Instrucciones generales  Hable con su mdico sobre las vacunas contra la gripe y la neumona.  Conserve una lista de los medicamentos que est tomando. Esto puede ser til en situaciones de Associate Professor.  Concurra a todas las visitas de control como se lo haya indicado el mdico. Esto es importante. Comunquese con un mdico si:  Tiene dudas relacionadas con los medicamentos o si se saltea una dosis.  Se siente ansioso, deprimido o estresado.  Tiene hinchazn [employer], los tobillos, las piernas o el abdomen.  Siente falta de aire al realizar actividad o ejercicios.  Tiene tos.  Tiene fiebre.  Tiene dificultad para dormir.  Aumenta de 2 a 3 libras (1 a 1,4 kg) en 24 horas o 5 libras (2,3 kg) en una semana. Solicite ayuda de inmediato si:  Midwife.  Tiene dificultad para respirar mientras descansa.  Tiene cansancio intenso.  Se siente confundido.  Tiene mareos intensos.  Tiene latidos cardacos irregulares o rpidos.  Tiene nuseas o vmitos.  Tiene tos que empeora de noche o que no lo deja estar acostado.  Tiene tos que no desaparece.  Tiene tristeza o depresin aguda. Resumen  Cuando los sntomas de una  insuficiencia cardaca empeoran, se denomina exacerbacin de la insuficiencia cardaca.  Algunas de las causas frecuentes de esta afeccin son la incorrecta administracin de medicamentos, infecciones y la ingesta de alcohol.  El tratamiento de esta afeccin incluye modificar los medicamentos, Pharmacologist un estilo de vida saludable o la [place].  No deje de tomar sus medicamentos ni cambie la cantidad que toma. Si tiene problemas o experimenta efectos secundarios por los medicamentos, hable con su mdico. Esta informacin no tiene Theme park manager el consejo del mdico. Asegrese de hacerle al mdico cualquier pregunta que tenga. Document Released: 04/16/2017 Document Revised: 04/16/2017 Document Reviewed: 04/16/2017 Elsevier Interactive Patient Education  2019 [employer].

## 2018-12-13 NOTE — Plan of Care (Signed)
  Problem: Clinical Measurements: Goal: Cardiovascular complication will be avoided Outcome: Progressing   Problem: Nutrition: Goal: Adequate nutrition will be maintained Outcome: Progressing   Problem: Coping: Goal: Level of anxiety will decrease Outcome: Progressing   Problem: Pain Managment: Goal: General experience of comfort will improve Outcome: Progressing   

## 2018-12-13 NOTE — Progress Notes (Signed)
Physical Therapy Treatment Patient Details Name: Veronica Castaneda MRN: [id number] DOB: 08-Feb-1961 Today's Date: 12/13/2018    History of Present Illness Pt is a 58 year old woman admitted 11/28/18 with acute hypoxic respiratory failure with pulmonary edema 2/2 flu B PNA. ETT 3/6-3/18.     PT Comments    Patient ambulating further distances on 4L today, VSS. Mild unsteadiness with RW. Will likely be able to progress to HHPT in time for medical d/c.   Follow Up Recommendations  Home health PT;Supervision/Assistance - 24 hour     Equipment Recommendations  (TBD)    Recommendations for Other Services       Precautions / Restrictions Precautions Precautions: Fall Restrictions Weight Bearing Restrictions: No    Mobility  Bed Mobility Overal bed mobility: Modified Independent                Transfers Overall transfer level: Needs assistance Equipment used: Rolling walker (2 wheeled) Transfers: Sit to/from Stand Sit to Stand: Min guard            Ambulation/Gait Ambulation/Gait assistance: Min Emergency planning/management officer (Feet): 50 Feet Assistive device: Rolling walker (2 wheeled) Gait Pattern/deviations: Step-to pattern Gait velocity: decreased   General Gait Details: patient able to ambulate further on 4L, VSS   Stairs             Wheelchair Mobility    Modified Rankin (Stroke Patients Only)       Balance Overall balance assessment: Needs assistance   Sitting balance-Leahy Scale: Fair       Standing balance-Leahy Scale: Fair                              Cognition Arousal/Alertness: Awake/alert Behavior During Therapy: WFL for tasks assessed/performed Overall Cognitive Status: Within Functional Limits for tasks assessed                                        Exercises      General Comments        Pertinent Vitals/Pain Pain Assessment: No/denies pain    Home Living                      Prior Function            PT Goals (current goals can now be found in the care plan section) Acute Rehab PT Goals Patient Stated Goal: to return home with family PT Goal Formulation: With patient/family Time For Goal Achievement: 12/25/18 Potential to Achieve Goals: Good    Frequency    Min 3X/week      PT Plan Current plan remains appropriate    Co-evaluation              AM-PAC PT "6 Clicks" Mobility   Outcome Measure  Help needed turning from your back to your side while in a flat bed without using bedrails?: None Help needed moving from lying on your back to sitting on the side of a flat bed without using bedrails?: A Little Help needed moving to and from a bed to a chair (including a wheelchair)?: A Little Help needed standing up from a chair using your arms (e.g., wheelchair or bedside chair)?: A Lot Help needed to walk in hospital room?: A Lot Help needed climbing 3-5 steps with a railing? : Total 6 Click  Score: 15    End of Session Equipment Utilized During Treatment: Gait belt Activity Tolerance: Patient tolerated treatment well Patient left: in chair;with call bell/phone within reach Nurse Communication: Mobility status PT Visit Diagnosis: Unsteadiness on feet (R26.81)     Time: 1640-1700 PT Time Calculation (min) (ACUTE ONLY): 20 min  Charges:  $Gait Training: 8-22 mins                     Etta Grandchild, PT, DPT Acute Rehabilitation Services Pager: [phone] Office: [phone]     Etta Grandchild 12/13/2018, 4:56 PM

## 2018-12-13 NOTE — Evaluation (Signed)
SATURATION QUALIFICATIONS: (This note is used to comply with regulatory documentation for home oxygen)  Patient Saturations on Room Air at Rest = 89%  Patient Saturations on Room Air while Ambulating = 83%  Patient Saturations on 4 Liters of oxygen while Ambulating = 93%  Please briefly explain why patient needs home oxygen: Patient is unable to maintain O2 sats >88% on room air while ambulating.

## 2018-12-13 NOTE — Discharge Summary (Addendum)
Discharge Summary  Veronica Castaneda ZOX:[id number] DOB: 1961-08-27  PCP: Patient, No Pcp Per  Admit date: 11/28/2018 Discharge date: 12/13/2018  Time spent: 35 minutes  Recommendations for Outpatient Follow-up:  1. Follow-up with your primary care provider 2. Follow-up with cardiology 3. Take your medications as prescribed 4. Continue physical therapy 5. Fall precautions  Discharge Diagnoses:  Active Hospital Problems   Diagnosis Date Noted   Acute respiratory failure (HCC) 11/28/2018    Resolved Hospital Problems  No resolved problems to display.    Discharge Condition: Stable  Diet recommendation: Resume previous diet  Vitals:   12/12/18 2300 12/13/18 0721  BP: 109/60 (!) 115/96  Pulse: 86 88  Resp: 20   Temp: 98.3 F (36.8 C) 98.1 F (36.7 C)  SpO2: 95% 95%    History of present illness:  58 yo Spanish speaking female presented with 3 days of cough, fever, chills, dyspnea. Found to have hypoxia with interstitial infiltrates.  Pneumococcal antigen and Legionella antigen were negative but Flu B was positive Required intubation and admitted to ICU on March 6. Echocardiogram in this admission showed EF of 45 to 50%.  Patient received IV diuresis during ICU stay but subsequently held in view of AKI.  GI service on 12/11/2018.  12/13/18: Patient seen and examined with family member at bedside.  Video interpreter was used to facilitate communication.  No acute events overnight.  Patient states she is breathing better.  She denies any chest pain.  She has no new complaints.  Due to new diagnosis of CHF during this admission, cardiology has been consulted to establish care.  May need to follow-up with cardiology outpatient.   Hospital Course:  Active Problems:   Acute respiratory failure (HCC)  1.    Resolving acute on chronic hypoxic/hypercapnic respiratory failure multifactorial secondary to influenza B pneumonia/systolic CHF with underlying obstructive sleep  apnea: Patient required intubation  March 6 to March 18. She tested postive for influenza B on 3/6 but does not appear to have gotten Rx with Tamiflu (?out of window) Patient completed empiric antibiotic course with Rocephin.  Chest x-ray on March 17 showed bilateral atelectasis.  Extubated on March 18.  Currently saturating well on 4 L of nasal cannula.  Seen by speech therapy and cleared for regular diet. Home O2 evaluation on 12/13/2018 completed.  Will require 4 L of oxygen by nasal cannula to maintain O2 saturation greater than 92%. DME oxygen and pulse oximeter  2.    Newly diagnosed systolic CHF: Received aggressive diuresis during ICU stay.    2D echo done on 11/29/2018 revealed LVEF 45 to 50% with global hypokinesis.  Cardiology Dr Royann Shivers has been consulted to establish care.  May need to follow-up with cardiology outpatient.  Mildly hypervolemic on exam.  Will start p.o. Lasix 40 mg daily and monitor strict I's and O's and daily weight.  Denies chest pain or dyspnea at rest.  3.    Resolved AKI//hypernatremia/hypokalemia: Follow-up with your PCP outpatient  4.    Chronic constipation: Continue MiraLAX daily as needed  5.  Deconditioning: PT recommended home health PT.  Fall precautions.   Code Status: Partial code.  No CPR/cardioversion Family / Patient Communication:  Discussed with patient and daughter using Spanish video interpreter.   Discharge Exam: BP (!) 115/96 (BP Location: Right Wrist)    Pulse 88    Temp 98.1 F (36.7 C) (Oral)    Resp 20    Ht  (1.473 m)  Wt 113 kg    SpO2 95%    BMI 52.07 kg/m   General: 58 y.o. year-old female well developed well nourished in no acute distress.  Alert and oriented x3.  Cardiovascular: Regular rate and rhythm with no rubs or gallops.  No thyromegaly or JVD noted.    Respiratory: Clear to auscultation with no wheezes or rales. Good inspiratory effort.  Abdomen: Soft nontender nondistended with normal bowel sounds x4  quadrants.  Musculoskeletal: Trace upper and lower extremity edema. 2/4 pulses in all 4 extremities.  Psychiatry: Mood is appropriate for condition and setting  Discharge Instructions You were cared for by a hospitalist during your hospital stay. If you have any questions about your discharge medications or the care you received while you were in the hospital after you are discharged, you can call the unit and asked to speak with the hospitalist on call if the hospitalist that took care of you is not available. Once you are discharged, your primary care physician will handle any further medical issues. Please note that NO REFILLS for any discharge medications will be authorized once you are discharged, as it is imperative that you return to your primary care physician (or establish a relationship with a primary care physician if you do not have one) for your aftercare needs so that they can reassess your need for medications and monitor your lab values.   Allergies as of 12/13/2018   No Known Allergies     Medication List    TAKE these medications   furosemide 40 MG tablet Commonly known as:  LASIX Take 1 tablet (40 mg total) by mouth daily.   multivitamin with minerals Tabs tablet Take 1 tablet by mouth daily.   pantoprazole 40 MG tablet Commonly known as:  PROTONIX Take 1 tablet (40 mg total) by mouth daily.   polyethylene glycol packet Commonly known as:  MIRALAX / GLYCOLAX Take 17 g by mouth daily as needed.   potassium chloride SA 20 MEQ tablet Commonly known as:  K-DUR,KLOR-CON Take 1 tablet (20 mEq total) by mouth daily.            Durable Medical Equipment  (From admission, onward)         Start     Ordered   12/13/18 0913  For home use only DME oxygen  Once    Question Answer Comment  Mode or (Route) Nasal cannula   Liters per Minute 4   Oxygen conserving device Yes   Oxygen delivery system Gas      12/13/18 0913   12/13/18 0913  For home use only DME  Pulse oximeter  Once     12/13/18 0913         No Known Allergies Follow-up Information    [facility] Follow up on 12/19/2018.   Why:  10:30 for hospital follow up Contact information: [address] [place] [address] [phone]       Croitoru, Rachelle Hora, MD. Call in 1 day(s).   Specialty:  Cardiology Why:  Please call for a post hospital follow-up appointment Contact information: [address] [place] [address] [phone]            The results of significant diagnostics from this hospitalization (including imaging, microbiology, ancillary and laboratory) are listed below for reference.    Significant Diagnostic Studies: Dg Chest Port 1 View  Result Date: 12/09/2018 CLINICAL DATA:  Hypoxia EXAM: PORTABLE CHEST 1 VIEW COMPARISON:  December 08, 2018 FINDINGS: Endotracheal tube tip is 3.5 cm above the carina. Nasogastric tube tip and side port are below the diaphragm. No pneumothorax. There is bibasilar atelectasis. There is no frank edema or consolidation. Heart is mildly enlarged with pulmonary vascularity normal. No adenopathy. There is degenerative change in the thoracic spine. There is an old healed fracture of the left clavicle. IMPRESSION: Tube positions as described without pneumothorax. Bibasilar atelectasis. No edema or consolidation. Stable cardiac silhouette. Electronically Signed   By: Bretta Bang III M.D.   On: 12/09/2018 07:34   Dg Chest Port 1 View  Result Date: 12/08/2018 CLINICAL DATA:  Check endotracheal tube placement EXAM: PORTABLE CHEST 1 VIEW COMPARISON:  12/07/2018 FINDINGS: Cardiac shadow is stable. Endotracheal tube and gastric catheter are again seen and stable. Lungs are well aerated bilaterally. Mild persistent airspace opacity is noted in the bases bilaterally. No sizable effusion is noted. No bony abnormality is seen. IMPRESSION: Mild bibasilar airspace opacity  Electronically Signed   By: Alcide Clever M.D.   On: 12/08/2018 07:07   Dg Chest Port 1 View  Result Date: 12/07/2018 CLINICAL DATA:  ETT EXAM: PORTABLE CHEST 1 VIEW COMPARISON:  12/06/2018 FINDINGS: Mild perihilar and bilateral lower lobe opacities, grossly unchanged. No pleural effusion or pneumothorax. The heart is top-normal in size. Endotracheal tube terminates 4.3 cm above the carina. Enteric tube courses into the stomach. IMPRESSION: Endotracheal tube terminates 4.3 cm above the carina. Mild pulmonary opacities, grossly unchanged. Electronically Signed   By: Charline Bills M.D.   On: 12/07/2018 05:20   Dg Chest Port 1 View  Result Date: 12/06/2018 CLINICAL DATA:  ETT EXAM: PORTABLE CHEST 1 VIEW COMPARISON:  12/05/2018 FINDINGS: Mild bilateral lower lobe opacities, right greater than left, improved. No frank interstitial edema. No pleural effusion or pneumothorax. Endotracheal tube terminates 3.5 cm above the carina. Enteric tube courses into the mid stomach. IMPRESSION: Endotracheal tube terminates 3.5 cm above the carina. Mild bilateral lower lobe opacities, right greater than left, improved. Electronically Signed   By: Charline Bills M.D.   On: 12/06/2018 06:16   Dg Chest Port 1 View  Result Date: 12/05/2018 CLINICAL DATA:  Endotracheal tube.  Influenza pneumonia EXAM: PORTABLE CHEST 1 VIEW COMPARISON:  Yesterday FINDINGS: Endotracheal tube tip just below the clavicular heads. The orogastric tube reaches the stomach. Cardiomegaly. Hazy opacity at both lung bases. No cephalized blood flow, visible pleural fluid, or pneumothorax. Remote left clavicle fracture IMPRESSION: Stable hardware positioning and lower lobe opacification. Electronically Signed   By: Marnee Spring M.D.   On: 12/05/2018 08:04   Dg Chest Port 1 View  Result Date: 12/04/2018 CLINICAL DATA:  Pneumonia. EXAM: PORTABLE CHEST 1 VIEW COMPARISON:  12/03/2018 FINDINGS: Support apparatus is stable. Cardiomediastinal  silhouette is mildly enlarged. Mediastinal contours appear intact. There is no evidence of pneumothorax. There is bilateral lower lobe airspace consolidation and probably bilateral pleural effusions. Osseous structures are without acute abnormality. Soft tissues are grossly normal. IMPRESSION: Bilateral lower lobe airspace consolidation and probably bilateral pleural effusions. Electronically Signed   By: Ted Mcalpine M.D.   On: 12/04/2018 08:59   Dg Chest Port 1 View  Result Date: 12/03/2018 CLINICAL DATA:  Respiratory failure EXAM: PORTABLE CHEST 1 VIEW COMPARISON:  Yesterday FINDINGS: Endotracheal tube tip 3 cm above the carina. Enteric tube tip at least reaches the stomach. Cardiomegaly. Hazy density at the right base that is increased. No Kerley lines, effusion, or pneumothorax. IMPRESSION: 1. Stable hardware positioning. 2. History of pneumonia  with increased opacity on the right. Electronically Signed   By: Marnee Spring M.D.   On: 12/03/2018 07:57   Dg Chest Port 1 View  Result Date: 12/02/2018 CLINICAL DATA:  Show shortness of breath EXAM: PORTABLE CHEST 1 VIEW COMPARISON:  12/01/2018 FINDINGS: Endotracheal tube and gastric catheter are again seen and stable. Cardiac shadow remains enlarged. Lungs are well aerated bilaterally with improved aeration in the right base. Mild increased density in the left retrocardiac region is noted consistent with atelectasis. No bony abnormality is seen. Persistent central vascular congestion is noted. IMPRESSION: Stable vascular congestion. Improved aeration in the right base with increase in left basilar atelectasis. Electronically Signed   By: Alcide Clever M.D.   On: 12/02/2018 05:48   Dg Chest Port 1 View  Result Date: 12/01/2018 CLINICAL DATA:  Hypoxia EXAM: PORTABLE CHEST 1 VIEW COMPARISON:  November 30, 2018. FINDINGS: Endotracheal tube tip is 2.5 cm above the carina. Nasogastric tube tip and side port are below the diaphragm. No pneumothorax. There is  cardiomegaly with mild pulmonary venous hypertension. No frank edema or consolidation. There is a small right pleural effusion. There is degenerative change in the thoracic spine. There is an old healed fracture of the right clavicle. No evident adenopathy. IMPRESSION: Tube positions as described without pneumothorax. Pulmonary vascular congestion with small right pleural effusion. No frank edema or consolidation. Electronically Signed   By: Bretta Bang III M.D.   On: 12/01/2018 07:40   Dg Chest Port 1 View  Result Date: 11/30/2018 CLINICAL DATA:  Respiratory failure EXAM: PORTABLE CHEST 1 VIEW COMPARISON:  11/29/2018 FINDINGS: Cardiac shadow is enlarged but stable. Endotracheal tube and gastric catheter are noted in satisfactory position. The lungs are well aerated bilaterally. Mild persistent vascular congestion is noted stable from the previous exam. No new focal infiltrate is seen. No bony abnormality is noted. IMPRESSION: Persistent central vascular congestion. Electronically Signed   By: Alcide Clever M.D.   On: 11/30/2018 08:24   Dg Chest Port 1 View  Result Date: 11/29/2018 CLINICAL DATA:  Pneumonia. Endotracheal tube. EXAM: PORTABLE CHEST 1 VIEW COMPARISON:  1 day prior FINDINGS: Mildly degraded exam due to AP portable technique and patient body habitus. Endotracheal tube terminates 1.3 cm above carina. Nasogastric tube extends beyond the inferior aspect of the film. Numerous leads and wires project over the chest. Cardiomegaly accentuated by AP portable technique. Probable layering small bilateral pleural effusions. No pneumothorax. Interstitial edema is moderate, increased. Worsened bibasilar airspace disease. IMPRESSION: 1. Worsened aeration with increased interstitial edema and bibasilar airspace disease. 2. Probable layering bilateral pleural effusions. 3. Endotracheal tube terminates 1.3 cm above carina. Consider retraction 2 cm. Electronically Signed   By: Jeronimo Greaves M.D.   On:  11/29/2018 08:07   Dg Chest Portable 1 View  Result Date: 11/28/2018 CLINICAL DATA:  Shortness of breath.  Intubation. EXAM: PORTABLE CHEST 1 VIEW COMPARISON:  One-view chest x-ray 11/28/2018 at 2:26 p.m. FINDINGS: Patient has been intubated. The endotracheal tube terminates near the carina and could be pulled back 2-3 cm for more optimal positioning. Heart is enlarged. Interstitial edema is again noted without significant change. Bibasilar airspace disease likely reflects atelectasis. IMPRESSION: 1. Endotracheal tube terminates just above the carina and could be pulled back 2-3 cm for more optimal positioning. 2. Stable findings of congestive heart failure. Electronically Signed   By: Marin Roberts M.D.   On: 11/28/2018 16:19   Dg Chest Port 1 View  Result Date: 11/28/2018 CLINICAL DATA:  Shortness of breath for the past 3 days. Nonsmoker. EXAM: PORTABLE CHEST 1 VIEW COMPARISON:  None. FINDINGS: Enlarged cardiac silhouette. Prominent pulmonary vasculature and interstitial markings. No visible Kerley lines or pleural fluid. Mild peribronchial thickening. Thoracic spine degenerative changes. IMPRESSION: Cardiomegaly and mild changes of congestive heart failure. Electronically Signed   By: Beckie Salts M.D.   On: 11/28/2018 15:06   Dg Abd Portable 1v  Result Date: 12/02/2018 CLINICAL DATA:  Abdominal distension EXAM: PORTABLE ABDOMEN - 1 VIEW COMPARISON:  12/02/2018, 12/01/2018 chest x-ray FINDINGS: Cardiomegaly with left infrahilar consolidation. Esophageal tube tip overlies the distal stomach. The gas pattern is nonobstructed. IMPRESSION: 1. Nonobstructed gas pattern 2. Esophageal tube tip projects over the distal stomach Electronically Signed   By: Jasmine Pang M.D.   On: 12/02/2018 22:04    Microbiology: No results found for this or any previous visit (from the past 240 hour(s)).   Labs: Basic Metabolic Panel: Recent Labs  Lab 12/09/18 2007 12/10/18 0450  12/11/18 0723 12/11/18 1648  12/12/18 0236 12/12/18 1715 12/13/18 0606  NA 156* 157*  --  152*  --  144  --  143  K 3.5 3.9  --  3.5  --  3.2*  --  3.6  CL 119* 117*  --  111  --  112*  --  106  CO2 29 32  --  28  --  27  --  28  GLUCOSE 160* 174*  --  120*  --  117*  --  119*  BUN 57* 52*  --  44*  --  31*  --  17  CREATININE 0.92 0.94  --  0.78  --  0.73  --  0.70  CALCIUM 9.6 9.7  --  9.5  --  9.2  --  9.0  MG  --  2.7*   < > 2.4 2.3 2.3 2.4 2.2  PHOS  --  3.2   < > 3.8 3.3 3.6 2.7 2.9   < > = values in this interval not displayed.   Liver Function Tests: No results for input(s): AST, ALT, ALKPHOS, BILITOT, PROT, ALBUMIN in the last 168 hours. No results for input(s): LIPASE, AMYLASE in the last 168 hours. No results for input(s): AMMONIA in the last 168 hours. CBC: Recent Labs  Lab 12/07/18 0344 12/07/18 0500 12/08/18 0336 12/08/18 0423 12/09/18 0325 12/11/18 0723  WBC 15.7*  --  13.0*  --  9.7 7.4  HGB 15.3* 16.0* 15.2* 16.0* 14.7 13.3  HCT 52.2* 47.0* 52.1* 47.0* 50.3* 46.0  MCV 99.6  --  99.4  --  100.6* 98.7  PLT 232  --  249  --  238 249   Cardiac Enzymes: No results for input(s): CKTOTAL, CKMB, CKMBINDEX, TROPONINI in the last 168 hours. BNP: BNP (last 3 results) Recent Labs    11/28/18 1326  BNP 585.3*    ProBNP (last 3 results) No results for input(s): PROBNP in the last 8760 hours.  CBG: Recent Labs  Lab 12/12/18 1645 12/12/18 1939 12/12/18 2336 12/13/18 0350 12/13/18 0720  GLUCAP 95 100* 106* 116* 116*       Signed:  Darlin Drop, MD [facility] 12/13/2018, 9:43 AM

## 2018-12-13 NOTE — Care Management (Addendum)
Patient with order to DC to hme w [facility] services and Home O2 and pulse ox. Utilized interpreter. Patient is uninsured, without SS#.  Referral made to [employer] for charity oxygen, rep will speak to patient about paying out of pocket, cost will be ~$300 month, if unable to pay may need to utilize LOG which will require approval from leadership. Patient and daughter in room understand that they will need to buy pulse oximeter at pharmacy or [facility].  DC meds OTC or on $4 list. Discussed low cost pharmacies. Referral made to [facility] for charity [facility]. Rep checking if they are able to take patient w/o SS#.   [employer] unable to provide home oxygen Patient will need LOG for home oxygen approved through leadership. Patient cannot DC at this time until home oxygen issue resolved.

## 2018-12-14 LAB — PHOSPHORUS: Phosphorus: 2.6 mg/dL (ref 2.5–4.6)

## 2018-12-14 LAB — GLUCOSE, CAPILLARY
Glucose-Capillary: 117 mg/dL — ABNORMAL HIGH (ref 70–99)
Glucose-Capillary: 123 mg/dL — ABNORMAL HIGH (ref 70–99)
Glucose-Capillary: 125 mg/dL — ABNORMAL HIGH (ref 70–99)
Glucose-Capillary: 128 mg/dL — ABNORMAL HIGH (ref 70–99)

## 2018-12-14 LAB — MAGNESIUM: Magnesium: 2 mg/dL (ref 1.7–2.4)

## 2018-12-14 MED ORDER — POLYETHYLENE GLYCOL 3350 17 G PO PACK: 17.0000 g | PACK | Freq: Every day | ORAL | 0 refills | Status: DC | PRN

## 2018-12-14 MED ORDER — ADULT MULTIVITAMIN W/MINERALS CH: 1.0000 | ORAL_TABLET | Freq: Every day | ORAL | 0 refills | Status: DC

## 2018-12-14 MED ORDER — PANTOPRAZOLE SODIUM 40 MG PO TBEC: 40.0000 mg | DELAYED_RELEASE_TABLET | Freq: Every day | ORAL | 0 refills | Status: DC

## 2018-12-14 NOTE — Plan of Care (Signed)
  Problem: Education: Goal: Knowledge of General Education information will improve Description: Including pain rating scale, medication(s)/side effects and non-pharmacologic comfort measures Outcome: Adequate for Discharge   Problem: Activity: Goal: Risk for activity intolerance will decrease Outcome: Adequate for Discharge   Problem: Nutrition: Goal: Adequate nutrition will be maintained Outcome: Adequate for Discharge   Problem: Elimination: Goal: Will not experience complications related to bowel motility Outcome: Adequate for Discharge   

## 2018-12-14 NOTE — TOC Transition Note (Signed)
Transition of Care [facility]) - CM/SW Discharge Note   Patient Details  Name: Veronica Castaneda MRN: [id number] Date of Birth: 1961-04-11  [facility] CM/SW Contact:  Lawerance Sabal, RN Phone Number: 12/14/2018, 10:24 AM   Clinical Narrative:     Notified by bedside RN that patient's daughter states she can pay out of pocket for home oxygen. Spoke w patient and daughter at bedside to discuss home oxygen. Daughter confirms she has money today to purchase it. Notified [employer], oxygen provider, rep will be to room to deliver O2 this AM. Discussed with patient and daughter that since she has availalble credit line, per [facility] when assessed for charity care, she does not qualify for charity [facility] services. Daughter states that there will be family able to support her needs at DC.  Discussed home medication needs, and explained MATCH program. Provided with MATCH letter for home.  All communication done through iPad interpreter system.  No further CM needs.     Final next level of care: Home/Self Care Barriers to Discharge: Barriers Resolved   Patient Goals and CMS Choice        Discharge Placement                       Discharge Plan and Services                          Social Determinants of Health (SDOH) Interventions     Readmission Risk Interventions No flowsheet data found.

## 2018-12-14 NOTE — Discharge Summary (Signed)
Discharge Summary  Veronica Castaneda HKF:[id number] DOB: 1961-03-29  PCP: Patient, No Pcp Per  Admit date: 11/28/2018 Discharge date: 12/14/2018  Time spent: 35 minutes  Dc delayed due to the patient unable to afford supplemental oxygen.  Recommendations for Outpatient Follow-up:  1. Follow-up with your primary care provider 2. Follow-up with cardiology 3. Take your medications as prescribed 4. Continue physical therapy 5. Fall precautions  Discharge Diagnoses:  Active Hospital Problems   Diagnosis Date Noted   Acute respiratory failure (HCC) 11/28/2018    Resolved Hospital Problems  No resolved problems to display.    Discharge Condition: Stable  Diet recommendation: Resume previous diet  Vitals:   12/13/18 1709 12/14/18 0809  BP: 113/80 117/73  Pulse: 88 76  Resp: (!) 24   Temp: 98.7 F (37.1 C) 98.8 F (37.1 C)  SpO2: 94% 95%    History of present illness:  58 yo Spanish speaking female presented with 3 days of cough, fever, chills, dyspnea. Found to have hypoxia with interstitial infiltrates.  Pneumococcal antigen and Legionella antigen were negative but Flu B was positive Required intubation and admitted to ICU on March 6. Echocardiogram in this admission showed EF of 45 to 50%.  Patient received IV diuresis during ICU stay but subsequently held in view of AKI.  GI service on 12/11/2018.  12/13/18: Patient seen and examined with family member at bedside.  Video interpreter was used to facilitate communication.  No acute events overnight.  Patient states she is breathing better.  She denies any chest pain.  She has no new complaints.  12/14/18: Discharge delayed yesterday 12/13/2018 due to the patient inability to afford supplemental oxygen.  Patient seen and examined with family member at her bedside.  No acute events overnight.  She has no new complaints.  Vital signs reviewed and are stable.  On the day of discharge, the patient was hemodynamically  stable.  She will need to follow-up with her primary care provider post authorization.  To note, follow-up 2D echo was done on 12/13/2018 which revealed normal LVEF.  Cardiology suspected transient LV dysfunction due to acute illness.    Hospital Course:  Active Problems:   Acute respiratory failure (HCC)  1.    Resolving acute on chronic hypoxic/hypercapnic respiratory failure multifactorial secondary to influenza B pneumonia/systolic CHF with underlying obstructive sleep apnea: Patient required intubation  March 6 to March 18. She tested postive for influenza B on 3/6 but does not appear to have gotten Rx with Tamiflu (?out of window) Patient completed empiric antibiotic course with Rocephin.  Chest x-ray on March 17 showed bilateral atelectasis.  Extubated on March 18.  Currently saturating well on 4 L of nasal cannula.  Seen by speech therapy and cleared for regular diet. Home O2 evaluation on 12/13/2018 completed.  Will require 4 L of oxygen by nasal cannula to maintain O2 saturation greater than 92%. DME oxygen and pulse oximeter  2.    Transient LV dysfunction due to acute illness: Received aggressive diuresis during ICU stay.    2D echo done on 11/29/2018 revealed LVEF 45 to 50% with global hypokinesis.  Follow-up 2D echo done on 12/13/2018 shows normalization of LV function.  Denies chest pain or dyspnea at rest.  3.    Resolved AKI//hypernatremia/hypokalemia: Follow-up with your PCP outpatient  4.    Chronic constipation: Continue MiraLAX daily as needed  5.  Deconditioning: PT recommended home health PT.  Fall precautions.   Code Status: Partial code.  No CPR/cardioversion  Family / Patient Communication:  Discussed with patient and daughter using Spanish video interpreter.   Discharge Exam: BP 117/73 (BP Location: Right Wrist)    Pulse 76    Temp 98.8 F (37.1 C) (Oral)    Resp (!) 24    Ht  (1.473 m)    Wt 112.3 kg    SpO2 95%    BMI 51.74 kg/m   General: 58 y.o. year-old  female well-developed well-nourished no acute distress.  Alert and oriented x3.    Cardiovascular: Regular rate and rhythm with no rubs or gallops.  No JVD or thyromegaly.  Respiratory: Clear to Auscultation with No Wheezes or Rales.  Good Inspiratory Effort.  Abdomen: Soft nontender nondistended with normal bowel sounds x4 quadrants.  Psychiatry: Mood is appropriate for condition and setting  Discharge Instructions You were cared for by a hospitalist during your hospital stay. If you have any questions about your discharge medications or the care you received while you were in the hospital after you are discharged, you can call the unit and asked to speak with the hospitalist on call if the hospitalist that took care of you is not available. Once you are discharged, your primary care physician will handle any further medical issues. Please note that NO REFILLS for any discharge medications will be authorized once you are discharged, as it is imperative that you return to your primary care physician (or establish a relationship with a primary care physician if you do not have one) for your aftercare needs so that they can reassess your need for medications and monitor your lab values.   Allergies as of 12/14/2018   No Known Allergies     Medication List    TAKE these medications   multivitamin with minerals Tabs tablet Take 1 tablet by mouth daily.   pantoprazole 40 MG tablet Commonly known as:  PROTONIX Take 1 tablet (40 mg total) by mouth daily.   polyethylene glycol packet Commonly known as:  MIRALAX / GLYCOLAX Take 17 g by mouth daily as needed.            Durable Medical Equipment  (From admission, onward)         Start     Ordered   12/13/18 1021  For home use only DME oxygen  Once    Question Answer Comment  Mode or (Route) Nasal cannula   Liters per Minute 4   Frequency Continuous (stationary and portable oxygen unit needed)   Oxygen conserving device Yes     Oxygen delivery system Gas      12/13/18 1020   12/13/18 0913  For home use only DME Pulse oximeter  Once     12/13/18 0913         No Known Allergies Follow-up Information    [facility] Follow up on 12/19/2018.   Why:  10:30 for hospital follow up Contact information: [address] [place] [address] [phone]       Croitoru, Rachelle Hora, MD. Call in 1 day(s).   Specialty:  Cardiology Why:  Please call for a post hospital follow-up appointment Contact information: [address] [place] [address] [phone]            The results of significant diagnostics from this hospitalization (including imaging, microbiology, ancillary and laboratory) are listed below for reference.    Significant Diagnostic Studies: Dg Chest Port 1 View  Result Date: 12/09/2018 CLINICAL DATA:  Hypoxia EXAM: PORTABLE CHEST 1 VIEW COMPARISON:  December 08, 2018 FINDINGS: Endotracheal tube tip is 3.5 cm above the carina. Nasogastric tube tip and side port are below the diaphragm. No pneumothorax. There is bibasilar atelectasis. There is no frank edema or consolidation. Heart is mildly enlarged with pulmonary vascularity normal. No adenopathy. There is degenerative change in the thoracic spine. There is an old healed fracture of the left clavicle. IMPRESSION: Tube positions as described without pneumothorax. Bibasilar atelectasis. No edema or consolidation. Stable cardiac silhouette. Electronically Signed   By: Bretta Bang III M.D.   On: 12/09/2018 07:34   Dg Chest Port 1 View  Result Date: 12/08/2018 CLINICAL DATA:  Check endotracheal tube placement EXAM: PORTABLE CHEST 1 VIEW COMPARISON:  12/07/2018 FINDINGS: Cardiac shadow is stable. Endotracheal tube and gastric catheter are again seen and stable. Lungs are well aerated bilaterally. Mild persistent airspace opacity is noted in the bases bilaterally. No sizable effusion is  noted. No bony abnormality is seen. IMPRESSION: Mild bibasilar airspace opacity Electronically Signed   By: Alcide Clever M.D.   On: 12/08/2018 07:07   Dg Chest Port 1 View  Result Date: 12/07/2018 CLINICAL DATA:  ETT EXAM: PORTABLE CHEST 1 VIEW COMPARISON:  12/06/2018 FINDINGS: Mild perihilar and bilateral lower lobe opacities, grossly unchanged. No pleural effusion or pneumothorax. The heart is top-normal in size. Endotracheal tube terminates 4.3 cm above the carina. Enteric tube courses into the stomach. IMPRESSION: Endotracheal tube terminates 4.3 cm above the carina. Mild pulmonary opacities, grossly unchanged. Electronically Signed   By: Charline Bills M.D.   On: 12/07/2018 05:20   Dg Chest Port 1 View  Result Date: 12/06/2018 CLINICAL DATA:  ETT EXAM: PORTABLE CHEST 1 VIEW COMPARISON:  12/05/2018 FINDINGS: Mild bilateral lower lobe opacities, right greater than left, improved. No frank interstitial edema. No pleural effusion or pneumothorax. Endotracheal tube terminates 3.5 cm above the carina. Enteric tube courses into the mid stomach. IMPRESSION: Endotracheal tube terminates 3.5 cm above the carina. Mild bilateral lower lobe opacities, right greater than left, improved. Electronically Signed   By: Charline Bills M.D.   On: 12/06/2018 06:16   Dg Chest Port 1 View  Result Date: 12/05/2018 CLINICAL DATA:  Endotracheal tube.  Influenza pneumonia EXAM: PORTABLE CHEST 1 VIEW COMPARISON:  Yesterday FINDINGS: Endotracheal tube tip just below the clavicular heads. The orogastric tube reaches the stomach. Cardiomegaly. Hazy opacity at both lung bases. No cephalized blood flow, visible pleural fluid, or pneumothorax. Remote left clavicle fracture IMPRESSION: Stable hardware positioning and lower lobe opacification. Electronically Signed   By: Marnee Spring M.D.   On: 12/05/2018 08:04   Dg Chest Port 1 View  Result Date: 12/04/2018 CLINICAL DATA:  Pneumonia. EXAM: PORTABLE CHEST 1 VIEW  COMPARISON:  12/03/2018 FINDINGS: Support apparatus is stable. Cardiomediastinal silhouette is mildly enlarged. Mediastinal contours appear intact. There is no evidence of pneumothorax. There is bilateral lower lobe airspace consolidation and probably bilateral pleural effusions. Osseous structures are without acute abnormality. Soft tissues are grossly normal. IMPRESSION: Bilateral lower lobe airspace consolidation and probably bilateral pleural effusions. Electronically Signed   By: Ted Mcalpine M.D.   On: 12/04/2018 08:59   Dg Chest Port 1 View  Result Date: 12/03/2018 CLINICAL DATA:  Respiratory failure EXAM: PORTABLE CHEST 1 VIEW COMPARISON:  Yesterday FINDINGS: Endotracheal tube tip 3 cm above the carina. Enteric tube tip at least reaches the stomach. Cardiomegaly. Hazy density at the right base that is increased. No Kerley lines, effusion, or pneumothorax. IMPRESSION:  1. Stable hardware positioning. 2. History of pneumonia with increased opacity on the right. Electronically Signed   By: Marnee Spring M.D.   On: 12/03/2018 07:57   Dg Chest Port 1 View  Result Date: 12/02/2018 CLINICAL DATA:  Show shortness of breath EXAM: PORTABLE CHEST 1 VIEW COMPARISON:  12/01/2018 FINDINGS: Endotracheal tube and gastric catheter are again seen and stable. Cardiac shadow remains enlarged. Lungs are well aerated bilaterally with improved aeration in the right base. Mild increased density in the left retrocardiac region is noted consistent with atelectasis. No bony abnormality is seen. Persistent central vascular congestion is noted. IMPRESSION: Stable vascular congestion. Improved aeration in the right base with increase in left basilar atelectasis. Electronically Signed   By: Alcide Clever M.D.   On: 12/02/2018 05:48   Dg Chest Port 1 View  Result Date: 12/01/2018 CLINICAL DATA:  Hypoxia EXAM: PORTABLE CHEST 1 VIEW COMPARISON:  November 30, 2018. FINDINGS: Endotracheal tube tip is 2.5 cm above the carina.  Nasogastric tube tip and side port are below the diaphragm. No pneumothorax. There is cardiomegaly with mild pulmonary venous hypertension. No frank edema or consolidation. There is a small right pleural effusion. There is degenerative change in the thoracic spine. There is an old healed fracture of the right clavicle. No evident adenopathy. IMPRESSION: Tube positions as described without pneumothorax. Pulmonary vascular congestion with small right pleural effusion. No frank edema or consolidation. Electronically Signed   By: Bretta Bang III M.D.   On: 12/01/2018 07:40   Dg Chest Port 1 View  Result Date: 11/30/2018 CLINICAL DATA:  Respiratory failure EXAM: PORTABLE CHEST 1 VIEW COMPARISON:  11/29/2018 FINDINGS: Cardiac shadow is enlarged but stable. Endotracheal tube and gastric catheter are noted in satisfactory position. The lungs are well aerated bilaterally. Mild persistent vascular congestion is noted stable from the previous exam. No new focal infiltrate is seen. No bony abnormality is noted. IMPRESSION: Persistent central vascular congestion. Electronically Signed   By: Alcide Clever M.D.   On: 11/30/2018 08:24   Dg Chest Port 1 View  Result Date: 11/29/2018 CLINICAL DATA:  Pneumonia. Endotracheal tube. EXAM: PORTABLE CHEST 1 VIEW COMPARISON:  1 day prior FINDINGS: Mildly degraded exam due to AP portable technique and patient body habitus. Endotracheal tube terminates 1.3 cm above carina. Nasogastric tube extends beyond the inferior aspect of the film. Numerous leads and wires project over the chest. Cardiomegaly accentuated by AP portable technique. Probable layering small bilateral pleural effusions. No pneumothorax. Interstitial edema is moderate, increased. Worsened bibasilar airspace disease. IMPRESSION: 1. Worsened aeration with increased interstitial edema and bibasilar airspace disease. 2. Probable layering bilateral pleural effusions. 3. Endotracheal tube terminates 1.3 cm above carina.  Consider retraction 2 cm. Electronically Signed   By: Jeronimo Greaves M.D.   On: 11/29/2018 08:07   Dg Chest Portable 1 View  Result Date: 11/28/2018 CLINICAL DATA:  Shortness of breath.  Intubation. EXAM: PORTABLE CHEST 1 VIEW COMPARISON:  One-view chest x-ray 11/28/2018 at 2:26 p.m. FINDINGS: Patient has been intubated. The endotracheal tube terminates near the carina and could be pulled back 2-3 cm for more optimal positioning. Heart is enlarged. Interstitial edema is again noted without significant change. Bibasilar airspace disease likely reflects atelectasis. IMPRESSION: 1. Endotracheal tube terminates just above the carina and could be pulled back 2-3 cm for more optimal positioning. 2. Stable findings of congestive heart failure. Electronically Signed   By: Marin Roberts M.D.   On: 11/28/2018 16:19   Dg Chest Port 1  View  Result Date: 11/28/2018 CLINICAL DATA:  Shortness of breath for the past 3 days. Nonsmoker. EXAM: PORTABLE CHEST 1 VIEW COMPARISON:  None. FINDINGS: Enlarged cardiac silhouette. Prominent pulmonary vasculature and interstitial markings. No visible Kerley lines or pleural fluid. Mild peribronchial thickening. Thoracic spine degenerative changes. IMPRESSION: Cardiomegaly and mild changes of congestive heart failure. Electronically Signed   By: Beckie SaltsSteven  Reid M.D.   On: 11/28/2018 15:06   Dg Abd Portable 1v  Result Date: 12/02/2018 CLINICAL DATA:  Abdominal distension EXAM: PORTABLE ABDOMEN - 1 VIEW COMPARISON:  12/02/2018, 12/01/2018 chest x-ray FINDINGS: Cardiomegaly with left infrahilar consolidation. Esophageal tube tip overlies the distal stomach. The gas pattern is nonobstructed. IMPRESSION: 1. Nonobstructed gas pattern 2. Esophageal tube tip projects over the distal stomach Electronically Signed   By: Jasmine PangKim  Fujinaga M.D.   On: 12/02/2018 22:04    Microbiology: No results found for this or any previous visit (from the past 240 hour(s)).   Labs: Basic Metabolic  Panel: Recent Labs  Lab 12/09/18 2007 12/10/18 0450  12/11/18 0723  12/12/18 0236 12/12/18 1715 12/13/18 0606 12/13/18 1633 12/14/18 0554  NA 156* 157*  --  152*  --  144  --  143  --   --   K 3.5 3.9  --  3.5  --  3.2*  --  3.6  --   --   CL 119* 117*  --  111  --  112*  --  106  --   --   CO2 29 32  --  28  --  27  --  28  --   --   GLUCOSE 160* 174*  --  120*  --  117*  --  119*  --   --   BUN 57* 52*  --  44*  --  31*  --  17  --   --   CREATININE 0.92 0.94  --  0.78  --  0.73  --  0.70  --   --   CALCIUM 9.6 9.7  --  9.5  --  9.2  --  9.0  --   --   MG  --  2.7*   < > 2.4   < > 2.3 2.4 2.2 2.2 2.0  PHOS  --  3.2   < > 3.8   < > 3.6 2.7 2.9 2.7 2.6   < > = values in this interval not displayed.   Liver Function Tests: No results for input(s): AST, ALT, ALKPHOS, BILITOT, PROT, ALBUMIN in the last 168 hours. No results for input(s): LIPASE, AMYLASE in the last 168 hours. No results for input(s): AMMONIA in the last 168 hours. CBC: Recent Labs  Lab 12/08/18 0336 12/08/18 0423 12/09/18 0325 12/11/18 0723  WBC 13.0*  --  9.7 7.4  HGB 15.2* 16.0* 14.7 13.3  HCT 52.1* 47.0* 50.3* 46.0  MCV 99.4  --  100.6* 98.7  PLT 249  --  238 249   Cardiac Enzymes: No results for input(s): CKTOTAL, CKMB, CKMBINDEX, TROPONINI in the last 168 hours. BNP: BNP (last 3 results) Recent Labs    11/28/18 1326  BNP 585.3*    ProBNP (last 3 results) No results for input(s): PROBNP in the last 8760 hours.  CBG: Recent Labs  Lab 12/13/18 1643 12/13/18 1944 12/13/18 2352 12/14/18 0347 12/14/18 0812  GLUCAP 89 108* 128* 117* 123*       Signed:  Darlin Droparole N Moneka Mcquinn, MD [facility] 12/14/2018, 8:46 AM

## 2018-12-19 ENCOUNTER — Inpatient Hospital Stay: Payer: Self-pay

## 2018-12-23 ENCOUNTER — Inpatient Hospital Stay: Payer: Self-pay | Admitting: Nurse Practitioner

## 2018-12-29 ENCOUNTER — Inpatient Hospital Stay (INDEPENDENT_AMBULATORY_CARE_PROVIDER_SITE_OTHER): Payer: Self-pay | Admitting: Primary Care

## 2019-01-05 ENCOUNTER — Telehealth: Payer: Self-pay | Admitting: Physician Assistant

## 2019-01-05 NOTE — Telephone Encounter (Signed)
Patient was contacted with help from spanish interpretor Mr. Donald Pore [phone]. Tomorrow's visit is cancelled and switch to telephone visit with interpretor at 1PM this Friday. Staff will contact her later with further instructions

## 2019-01-05 NOTE — Telephone Encounter (Signed)
Patient's appointment type and date have been changed and I will call the patient with the help with an interpreter to go over consent and ask about obtaining vitals

## 2019-01-06 ENCOUNTER — Telehealth: Payer: Self-pay | Admitting: Physician Assistant

## 2019-01-07 ENCOUNTER — Telehealth: Payer: Self-pay | Admitting: Physician Assistant

## 2019-01-07 NOTE — Telephone Encounter (Signed)
LVM to pre reg. 01-07-19 ST °

## 2019-01-08 ENCOUNTER — Telehealth: Payer: Self-pay

## 2019-01-08 NOTE — Telephone Encounter (Signed)
With the help of interpreter [phone] a voice message was left for the patient to call back. Called patient to go over information needed for her upcoming appointment with Azalee Course, PA-C

## 2019-01-09 ENCOUNTER — Encounter: Payer: Self-pay | Admitting: Physician Assistant

## 2019-01-09 ENCOUNTER — Telehealth (INDEPENDENT_AMBULATORY_CARE_PROVIDER_SITE_OTHER): Payer: Self-pay | Admitting: Physician Assistant

## 2019-01-09 ENCOUNTER — Telehealth: Payer: Self-pay

## 2019-01-09 ENCOUNTER — Other Ambulatory Visit: Payer: Self-pay | Admitting: Physician Assistant

## 2019-01-09 DIAGNOSIS — I428 Other cardiomyopathies: Secondary | ICD-10-CM

## 2019-01-09 DIAGNOSIS — M7989 Other specified soft tissue disorders: Secondary | ICD-10-CM

## 2019-01-09 MED ORDER — FUROSEMIDE 20 MG PO TABS: 20.0000 mg | ORAL_TABLET | ORAL | 1 refills | Status: DC | PRN

## 2019-01-09 NOTE — Progress Notes (Signed)
Virtual Visit via Telephone Note   This visit type was conducted due to national recommendations for restrictions regarding the COVID-19 Pandemic (e.g. social distancing) in an effort to limit this patient's exposure and mitigate transmission in our community.  Due to her co-morbid illnesses, this patient is at least at moderate risk for complications without adequate follow up.  This format is felt to be most appropriate for this patient at this time.  The patient did not have access to video technology/had technical difficulties with video requiring transitioning to audio format only (telephone).  All issues noted in this document were discussed and addressed.  No physical exam could be performed with this format.  Please refer to the patient's chart for her  consent to telehealth for [facility].   Evaluation Performed:  Follow-up visit  Date:  01/09/2019   ID:  Veronica, Castaneda 1961-08-24, MRN [id number]  Patient Location: Home Provider Location: Home  PCP:  Patient, No Pcp Per  Cardiologist:  No primary care provider on file.  Electrophysiologist:  None   Chief Complaint:  followup  History of Present Illness:    Veronica Castaneda is a 58 y.o. female with no significant past medical history recently was admitted from 3/6-3/22 with cough, fever, chills and dyspnea.  She was found to have hypoxia was interstitial infiltrates.  Pneumococcal antigen and Legionella antigen were negative but flu B was positive.  She required intubation and was admitted to the ICU.  Echocardiogram obtained showed EF of 45 to 50%.  Patient received IV diuresis, however this resulted in AKI.  She did complete a empiric antibiotic course with Rocephin.  She was extubated on March 18 and was eventually discharged on 3/21 with 4 L supplemental oxygen to keep oxygen concentration at greater than 92%.  She was also discharged on home PT as well.  Her echocardiogram was reviewed by her  cardiologist who suspect the transient LV dysfunction was due to acute illness.  Echocardiogram was repeated prior to discharge which showed normalization of EF to 55 to 60%.  Patient was contacted today via telephone visit.  Interview was conducted using a telephone Spanish translator Mrs. Harriett Sine [phone].  According to the patient, her breathing has significantly improved since discharge.  She denies any chest discomfort or significant shortness of breath when she gets around.  She is still using oxygen as needed.  She denies any orthopnea or PND.  She does mention that since discharge she noticed her lower extremity edema has came back.  She says that she has pitting edema from the foot all the way up to the knee.  She says some days is better with some days it is worse.  I gave her a dose of 20 mg Lasix as needed.  I instructed her to take the Lasix only if her lower extremity edema worsens.  Otherwise she will continue to cut back on salt and to do leg elevation to help manage the symptoms conservatively.  The patient does not have symptoms concerning for COVID-19 infection (fever, chills, cough, or new shortness of breath).    No past medical history on file. Past Surgical History:  Procedure Laterality Date   No PAST SURGICAL HISTORY       Current Meds  Medication Sig   Multiple Vitamin (MULTIVITAMIN WITH MINERALS) TABS tablet Take 1 tablet by mouth daily.   pantoprazole (PROTONIX) 40 MG tablet Take 1 tablet (40 mg total) by mouth daily.   polyethylene glycol (MIRALAX /  GLYCOLAX) packet Take 17 g by mouth daily as needed.     Allergies:   Patient has no known allergies.   Social History   Tobacco Use   Smoking status: Never Smoker   Smokeless tobacco: Never Used  Substance Use Topics   Alcohol use: Not on file   Drug use: Not on file     Family Hx: The patient's family history includes Heart attack in her mother.  ROS:   Please see the history of present illness.      All other systems reviewed and are negative.   Prior CV studies:   The following studies were reviewed today:  Echo 11/29/2018 1. The left ventricle has mildly reduced systolic function, with an ejection fraction of 45-50%. The cavity size was normal. There is mildly increased left ventricular wall thickness. Left ventricular diastolic Doppler parameters are indeterminate Left  ventricular diffuse hypokinesis.  2. The mitral valve is normal in structure.  3. The aortic valve was not well visualized.  4. The ascending aorta and aortic root are normal in size and structure.  5. The interatrial septum was not assessed.    Echo 12/13/2018  1. The left ventricle has normal systolic function, with an ejection fraction of 55-60%. Left ventricular diastolic Doppler parameters are consistent with impaired relaxation.  2. The right ventricle has normal systolc function.  FINDINGS  Left Ventricle: The left ventricle has normal systolic function, with an ejection fraction of 55-60%. Left ventricular diastolic Doppler parameters are consistent with impaired relaxation (grade I). Right Ventricle: The right ventricle has normal systolic function.    Labs/Other Tests and Data Reviewed:    EKG:  An ECG dated 11/28/2018. was personally reviewed today and demonstrated:  Normal sinus rhythm with PVCs  Recent Labs: 11/28/2018: ALT 29; B Natriuretic Peptide 585.3 12/11/2018: Hemoglobin 13.3; Platelets 249 12/13/2018: BUN 17; Creatinine, Ser 0.70; Potassium 3.6; Sodium 143 12/14/2018: Magnesium 2.0   Recent Lipid Panel No results found for: CHOL, TRIG, HDL, CHOLHDL, LDLCALC, LDLDIRECT  Wt Readings from Last 3 Encounters:  12/14/18 247 lb 9.2 oz (112.3 kg)     Objective:    Vital Signs:  There were no vitals taken for this visit.   VITAL SIGNS:  reviewed unable to obtain vitals as patient did not check them  ASSESSMENT & PLAN:    1. Presumed nonischemic cardiomyopathy with normalized EF:  Denies any chest discomfort or shortness of breath.  Given lack of symptom, will hold off on ischemic work-up.  EF has normalized.  Previous drop in EF likely related to stress from acute respiratory failure and flu B.   2. Lower extremity edema: Since discharge, she says her lower extremity edema has recurred and now has pitting edema up to the knee.  I gave her some Lasix 20 mg to use only as needed if her lower extremity edema worsens.  COVID-19 Education: The signs and symptoms of COVID-19 were discussed with the patient and how to seek care for testing (follow up with PCP or arrange E-visit).  The importance of social distancing was discussed today.  Time:   Today, I have spent 27 minutes with the patient with telehealth technology discussing the above problems.     Medication Adjustments/Labs and Tests Ordered: Current medicines are reviewed at length with the patient today.  Concerns regarding medicines are outlined above.   Tests Ordered: No orders of the defined types were placed in this encounter.   Medication Changes: No orders of the defined  types were placed in this encounter.   Disposition:  Follow up in 1 month(s)  Signed, Azalee CourseHao Amerie Beaumont, PA  01/09/2019 1:23 PM    [facility]

## 2019-01-09 NOTE — Telephone Encounter (Signed)
With the help of Spanish interpreter [phone] consent was reviewed with patient and explained how her telephone visit with Azalee Course, PA-C will go

## 2019-01-09 NOTE — Patient Instructions (Signed)
Medication Instructions:   START Lasix 20 Mg as needed for leg swelling (No more than 1 tablet per day)  If you need a refill on your cardiac medications before your next appointment, please call your pharmacy.   Lab work:  You will need to come into our office in 2-3 weeks for labs (blood work):  BMET  If you have labs (blood work) drawn today and your tests are completely normal, you will receive your results only by: Marland Kitchen MyChart Message (if you have MyChart) OR . A paper copy in the mail If you have any lab test that is abnormal or we need to change your treatment, we will call you to review the results.  Testing/Procedures:  NONE ordered at this time of appointment   Follow-Up: At [facility], you and your health needs are our priority.  As part of our continuing mission to provide you with exceptional heart care, we have created designated Provider Care Teams.  These Care Teams include your primary Cardiologist (physician) and Advanced Practice Providers (APPs -  Physician Assistants and Nurse Practitioners) who all work together to provide you with the care you need, when you need it. . You will need a follow up Virtual appointment in 1 months with  Thurmon Fair, MD   Any Other Special Instructions Will Be Listed Below (If Applicable).

## 2019-01-09 NOTE — Telephone Encounter (Signed)
Virtual Visit Pre-Appointment Phone Call  Steps For Call:  1. Confirm consent - "In the setting of the current Covid19 crisis, you are scheduled for a TELEPHONE visit with Azalee CourseHao Meng, PA-C on 01/09/2019 at 1:00PM.  Just as we do with many in-office visits, in order for you to participate in this visit, we must obtain consent.  If you'd like, I can send this to your mychart (if signed up) or email for you to review.  Otherwise, I can obtain your verbal consent now.  All virtual visits are billed to your insurance company just like a normal visit would be.  By agreeing to a virtual visit, we'd like you to understand that the technology does not allow for your provider to perform an examination, and thus may limit your provider's ability to fully assess your condition. If your provider identifies any concerns that need to be evaluated in person, we will make arrangements to do so.  Finally, though the technology is pretty good, we cannot assure that it will always work on either your or our end, and in the setting of a video visit, we may have to convert it to a phone-only visit.  In either situation, we cannot ensure that we have a secure connection.  Are you willing to proceed?" STAFF: Did the patient verbally acknowledge consent to telehealth visit? Document YES/NO here: YES  2. Confirm the BEST phone number to call the day of the visit by including in appointment notes  3. Give patient instructions for WebEx/MyChart download to smartphone as below or Doximity/Doxy.me if video visit (depending on what platform provider is using)  4. Advise patient to be prepared with their blood pressure, heart rate, weight, any heart rhythm information, their current medicines, and a piece of paper and pen handy for any instructions they may receive the day of their visit  5. Inform patient they will receive a phone call 15 minutes prior to their appointment time (may be from unknown caller ID) so they should be  prepared to answer  6. Confirm that appointment type is correct in Epic appointment notes (VIDEO vs PHONE)     TELEPHONE CALL NOTE  Veronica Castaneda has been deemed a candidate for a follow-up tele-health visit to limit community exposure during the Covid-19 pandemic. I spoke with the patient via phone to ensure availability of phone/video source, confirm preferred email & phone number, and discuss instructions and expectations.  I reminded Veronica Castaneda to be prepared with any vital sign and/or heart rhythm information that could potentially be obtained via home monitoring, at the time of her visit. I reminded Veronica Castaneda to expect a phone call at the time of her visit if her visit.  Dorris Fetcherrah Daelyn Pettaway, CMA 01/09/2019 12:28 PM   INSTRUCTIONS FOR DOWNLOADING THE WEBEX APP TO SMARTPHONE  - If Apple, ask patient to go to [employer] Store and type in WebEx in the search bar. Download [employer]Webex Meetings, the blue/green circle. If Android, go to [employer] Play Store and type in [employer]Ex in the search bar. The app is free but as with any other app downloads, their phone may require them to verify saved payment information or Apple/Android password.  - The patient does NOT have to create an account. - On the day of the visit, the assist will walk the patient through joining the meeting with the meeting number/password.  INSTRUCTIONS FOR DOWNLOADING THE MYCHART APP TO SMARTPHONE  - The patient must first make sure  to have activated MyChart and know their login information - If Apple, go to [employer] and type in MyChart in the search bar and download the app. If Android, ask patient to go to [employer] and type in [place] in the search bar and download the app. The app is free but as with any other app downloads, their phone may require them to verify saved payment information or Apple/Android password.  - The patient will need to then log into the app with their MyChart  username and password, and select [place] as their healthcare provider to link the account. When it is time for your visit, go to the MyChart app, find appointments, and click Begin Video Visit. Be sure to Select Allow for your device to access the Microphone and Camera for your visit. You will then be connected, and your provider will be with you shortly.  **If they have any issues connecting, or need assistance please contact MyChart service desk [phone])**  **If using a computer, in order to ensure the best quality for their visit they will need to use either of the following Internet Browsers: [employer], or Google Chrome**  IF USING DOXIMITY or DOXY.ME - The patient will receive a link just prior to their visit, either by text or email (to be determined day of appointment depending on if it's doxy.me or Doximity).     FULL LENGTH CONSENT FOR TELE-HEALTH VISIT   I hereby voluntarily request, consent and authorize [facility] and its employed or contracted physicians, physician assistants, nurse practitioners or other licensed health care professionals (the Practitioner), to provide me with telemedicine health care services (the "Services") as deemed necessary by the treating Practitioner. I acknowledge and consent to receive the Services by the Practitioner via telemedicine. I understand that the telemedicine visit will involve communicating with the Practitioner through live audiovisual communication technology and the disclosure of certain medical information by electronic transmission. I acknowledge that I have been given the opportunity to request an in-person assessment or other available alternative prior to the telemedicine visit and am voluntarily participating in the telemedicine visit.  I understand that I have the right to withhold or withdraw my consent to the use of telemedicine in the course of my care at any time, without affecting my right to future  care or treatment, and that the Practitioner or I may terminate the telemedicine visit at any time. I understand that I have the right to inspect all information obtained and/or recorded in the course of the telemedicine visit and may receive copies of available information for a reasonable fee.  I understand that some of the potential risks of receiving the Services via telemedicine include:  Marland Kitchen Delay or interruption in medical evaluation due to technological equipment failure or disruption; . Information transmitted may not be sufficient (e.g. poor resolution of images) to allow for appropriate medical decision making by the Practitioner; and/or  . In rare instances, security protocols could fail, causing a breach of personal health information.  Furthermore, I acknowledge that it is my responsibility to provide information about my medical history, conditions and care that is complete and accurate to the best of my ability. I acknowledge that Practitioner's advice, recommendations, and/or decision may be based on factors not within their control, such as incomplete or inaccurate data provided by me or distortions of diagnostic images or specimens that may result from electronic transmissions. I understand that the practice of medicine is not an exact science  and that Practitioner makes no warranties or guarantees regarding treatment outcomes. I acknowledge that I will receive a copy of this consent concurrently upon execution via email to the email address I last provided but may also request a printed copy by calling the office of [facility].    I understand that my insurance will be billed for this visit.   I have read or had this consent read to me. . I understand the contents of this consent, which adequately explains the benefits and risks of the Services being provided via telemedicine.  . I have been provided ample opportunity to ask questions regarding this consent and the Services and have  had my questions answered to my satisfaction. . I give my informed consent for the services to be provided through the use of telemedicine in my medical care  By participating in this telemedicine visit I agree to the above.

## 2019-01-12 NOTE — Progress Notes (Signed)
TY MCr 

## 2019-01-30 LAB — BASIC METABOLIC PANEL
BUN/Creatinine Ratio: 16 (ref 9–23)
BUN: 11 mg/dL (ref 6–24)
CO2: 27 mmol/L (ref 20–29)
Calcium: 9.6 mg/dL (ref 8.7–10.2)
Chloride: 100 mmol/L (ref 96–106)
Creatinine, Ser: 0.7 mg/dL (ref 0.57–1.00)
GFR calc Af Amer: 113 mL/min/{1.73_m2} (ref >59)
GFR calc non Af Amer: 98 mL/min/{1.73_m2} (ref >59)
Glucose: 78 mg/dL (ref 65–99)
Potassium: 5.1 mmol/L (ref 3.5–5.2)
Sodium: 142 mmol/L (ref 134–144)

## 2019-02-02 ENCOUNTER — Ambulatory Visit: Payer: Self-pay | Admitting: Internal Medicine

## 2019-02-02 ENCOUNTER — Encounter: Payer: Self-pay | Admitting: Internal Medicine

## 2019-02-02 ENCOUNTER — Other Ambulatory Visit: Payer: Self-pay

## 2019-02-02 ENCOUNTER — Ambulatory Visit: Payer: Self-pay | Attending: Internal Medicine | Admitting: Internal Medicine

## 2019-02-02 VITALS — BP 119/69 | HR 69 | Temp 98.5°F | Resp 16 | Ht <= 58 in | Wt 252.0 lb

## 2019-02-02 DIAGNOSIS — J9611 Chronic respiratory failure with hypoxia: Secondary | ICD-10-CM

## 2019-02-02 DIAGNOSIS — R0683 Snoring: Secondary | ICD-10-CM

## 2019-02-02 DIAGNOSIS — M1712 Unilateral primary osteoarthritis, left knee: Secondary | ICD-10-CM

## 2019-02-02 DIAGNOSIS — R400 Somnolence: Secondary | ICD-10-CM

## 2019-02-02 MED ORDER — FUROSEMIDE 20 MG PO TABS: 20.0000 mg | ORAL_TABLET | Freq: Every day | ORAL | 1 refills | Status: DC | PRN

## 2019-02-02 MED ORDER — DICLOFENAC SODIUM 1 % TD GEL: 2.0000 g | Freq: Four times a day (QID) | TRANSDERMAL | 1 refills | Status: DC

## 2019-02-02 NOTE — Patient Instructions (Signed)
Obesidad en los adultos  Obesity, Adult  Introduccin  La obesidad es una afeccin que implica tener demasiada grasa corporal total. Tener sobrepeso u obesidad significa que el peso es mayor que lo que se considera saludable para el tamao corporal. La obesidad se determina por una medida llamada IMC. El IMC (ndice de masa corporal) es la estimacin de la grasa corporal y se calcula a partir de la altura y el peso. Si un adulto tiene un IMC de 30 o superior se considera obeso.  La obesidad puede conducir a algunos de los siguientes problemas de salud y enfermedades graves:   Ictus.   Enfermedad arterial coronaria (EAC).   Diabetes tipo 2.   Algunos tipos de cncer, incluido el cncer de colon, mama, tero y vescula.   Artrosis.   Presin arterial elevada (hipertensin arterial).   Colesterol elevado.   Apnea del sueo.   Clculos en la vescula.   Problemas de esterilidad.  Cules son las causas?  La causa principal de la obesidad es consumir ms caloras que las que su cuerpo usa para generar energa. Otros factores que contribuyen a esta condicin pueden incluir lo siguiente:   Nacer con genes que lo hacen ms propenso a ser obeso.   Tener una afeccin mdica que causa obesidad. Estos problemas incluyen los siguientes:  ? Hipotiroidismo.  ? Sndrome de ovario poliqustico (SOP).  ? Trastorno alimentario compulsivo.  ? Sndrome de [place].   Tomar ciertos medicamentos, como esteroides, antidepresivos y anticonvulsivos.   No ser fsicamente activo (estilo de vida sedentario).   Vivir en lugares en donde haya escasez de lugares para realizar actividad fsica de manera segura o para comprar alimentos saludables.   No dormir lo suficiente.  Qu incrementa el riesgo?  Los siguientes factores pueden aumentar el riesgo de esta afeccin:   Tener antecedentes familiares de obesidad.   Ser una mujer de origen afroamericano.   Ser un hombre de origen hispano.  Cules son los signos o los  sntomas?  Tener grasa corporal excesiva es el sntoma principal de esta afeccin.  Cmo se diagnostica?  Esta afeccin se puede diagnosticar en funcin de lo siguiente:   Sus sntomas.   Sus antecedentes mdicos.   Un examen fsico. El mdico puede medir lo siguiente:  ? Su IMC. Si usted es un adulto y su IMC es de entre 25 y 30, entonces tiene sobrepeso. Si usted es un adulto y su IMC es de 30 o ms, se considera que es obeso.  ? Las medidas alrededor (circunferencias) de la cadera y de la cintura. Estas medidas pueden tenerse en cuenta para diagnosticar la afeccin.  ? El grosor del pliegue cutneo. El mdico puede pellizcar suavemente un pliegue de la piel y medirlo.  Cmo se trata?  El tratamiento de esta afeccin frecuentemente incluye cambiar el estilo de vida. El tratamiento puede incluir algunos o todos los siguientes elementos:   Cambios en la dieta. Trabaje con el mdico y un nutricionista para establecer una meta de prdida de peso que sea saludable y razonable para usted. Los cambios en la dieta pueden incluir comer lo siguiente:  ? Porciones ms pequeas. El tamao de una porcin es la cantidad de alimento que se considera saludable ingerir en un determinado momento. Esto vara de una persona a otra.  ? Opciones de bajo contenido calrico o graso.  ? Ms cereales integrales, frutas y verduras.   Realizar actividad fsica con regularidad. Esto puede incluir actividad aerbica (cardaca) y fortalecimiento muscular.     Medicamentos para ayudarlo a perder peso. Su mdico puede prescribir medicamentos si usted no puede perder 1 libra por semana despus de 6 semanas de comer ms saludablemente y hacer ms actividad fsica.   Someterse a una ciruga. Las opciones quirrgicas pueden incluir bandas gstricas y bypass gstrico. Se puede realizar una ciruga si:  ? Otros tratamientos mejoraron su afeccin.  ? Tiene un IMC de 40 o superior.  ? Tiene problemas de salud potencialmente mortales relacionados  con la obesidad.  Siga estas instrucciones en su casa:    Comida y bebida     Siga las indicaciones del mdico respecto de lo que puede comer o beber. El mdico puede indicarle que haga lo siguiente:  ? Limitar las comidas rpidas, los dulces y los snacks procesados.  ? Elegir opciones con bajo contenido de grasa, como leche descremada en lugar de leche entera.  ? Consumir 5o ms porciones de frutas o verduras por da.  ? Comer en casa con ms frecuencia. Esto le da ms control sobre lo que come.  ? Cuando coma afuera, elija alimentos saludables.  ? Aprenda cul es el tamao de una porcin saludable.  ? Tenga a mano colaciones con bajo contenido de grasa.  ? Evite las bebidas azucaradas, como refrescos, jugo de frutas, t helado endulzado con azcar y leche saborizada.  ? Desayune de forma saludable.   Beba suficiente agua para mantener la orina clara o de color amarillo plido.   No deje transcurrir largos perodos de tiempo sin comer (no ayune) ni siga una dieta de moda. El ayuno y las dietas de moda pueden ser insalubres, e incluso peligrosas.  Actividad fsica   Haga actividad fsica habitualmente como se lo haya indicado el mdico. Consulte al mdico qu tipo de ejercicios es seguro para usted y con qu frecuencia debe ejercitarse.   Precaliente y elongue adecuadamente antes de la actividad.   Reljese y elongue despus de realizar una actividad.   Descanse entre los perodos de actividad.  Estilo de vida   Limite el tiempo que pasa mirando televisin, o usando una computadora o un videojuego.   Busque formas de recompensarse que no incluyan alimentos.   Limite el consumo de alcohol a no ms de 1 medida por da si es mujer y no est embarazada y a 2 medidas por da si es hombre. Una medida equivale a 12onzas de cerveza, 5onzas de vino o 1onzas de bebidas alcohlicas de alta graduacin.  Instrucciones generales   Registre la prdida de peso en un diario para realizar un seguimiento de los  alimentos que consume y cunto se ejercita.   Tome los medicamentos de venta libre y los recetados solamente como se lo haya indicado el mdico.   Tome vitaminas y suplementos solamente como se lo haya indicado el mdico.   Considere la posibilidad de participar en un grupo de apoyo. El mdico podra recomendarle un grupo de apoyo.   Concurra a todas las visitas de control como se lo haya indicado el mdico. Esto es importante.  Comunquese con un mdico si:   No puede alcanzar su objetivo de prdida de peso despus de 6 semanas de cambios en la dieta y en el estilo de vida.  Esta informacin no tiene como fin reemplazar el consejo del mdico. Asegrese de hacerle al mdico cualquier pregunta que tenga.  Document Released: 09/10/2005 Document Revised: 12/14/2016 Document Reviewed: 06/29/2015  Elsevier Interactive Patient Education  2019 [employer]

## 2019-02-02 NOTE — Progress Notes (Signed)
Patient ID: Veronica Castaneda, female    DOB: 16-Jul-1961  MRN: [id number]  CC: New Patient (Initial Visit) and Hip Pain (left)   Subjective: Veronica Castaneda is a 58 y.o. female who presents for new pt visit and hosp f/u Her concerns today include:   Pt hosp 3/6-22/2020 with acute on chronic hypoxic/hypercapnic respiratory failure that was multifactorial secondary to influenza B pneumonia, systolic CHF associated with acute illness and possibly underlying obstructive sleep apnea.  Patient was intubated for 12 days and was treated with antibiotics.  She was discharged home on 4 L of O2 to maintain O2 saturation greater than 92%. -Patient had transient LV dysfunction thought to be due to acute illness.  Initial 2D echo revealed LV EF of 45 to 50% but by the time of discharge repeat echo revealed normal LV function.  Patient had also developed acute kidney injury that had resolved.  Today: Patient reports that she discontinued using oxygen about a week ago because her pulse ox was staying between 93 to 95% without it. Denies SOB with ambulation and at rest.  No PND/orthopena.  Sleeps on 1 pillow.  Endorses LE edema.  LE edema occurs when she sits for long time.  She limits salt in the foods -She endorses loud snoring as reported by her family and daytime sleepiness.  She is never had a sleep study.  She is uninsured.  C/o pain in the LT  knee with prolong standing and walking for over 1 yr.  No known injury No jt swelling that she can tell Not taking anything for pain  Social history, family history, surgical history reviewed. Patient Active Problem List   Diagnosis Date Noted  . Acute respiratory failure (HCC) 11/28/2018     No current outpatient medications on file prior to visit.   No current facility-administered medications on file prior to visit.     No Known Allergies  Social History   Socioeconomic History  . Marital status: Single    Spouse name: Not  on file  . Number of children: 3  . Years of education: Not on file  . Highest education level: Not on file  Occupational History  . Occupation: house wife  Social Needs  . Financial resource strain: Not on file  . Food insecurity:    Worry: Not on file    Inability: Not on file  . Transportation needs:    Medical: Not on file    Non-medical: Not on file  Tobacco Use  . Smoking status: Never Smoker  . Smokeless tobacco: Never Used  Substance and Sexual Activity  . Alcohol use: Never    Frequency: Never  . Drug use: Never  . Sexual activity: Not on file  Lifestyle  . Physical activity:    Days per week: Not on file    Minutes per session: Not on file  . Stress: Not on file  Relationships  . Social connections:    Talks on phone: Not on file    Gets together: Not on file    Attends religious service: Not on file    Active member of club or organization: Not on file    Attends meetings of clubs or organizations: Not on file    Relationship status: Not on file  . Intimate partner violence:    Fear of current or ex partner: Not on file    Emotionally abused: Not on file    Physically abused: Not on file  Forced sexual activity: Not on file  Other Topics Concern  . Not on file  Social History Narrative  . Not on file    Family History  Problem Relation Age of Onset  . Heart attack Mother   . Heart disease Mother     Past Surgical History:  Procedure Laterality Date  . No PAST SURGICAL HISTORY      ROS: Review of Systems Negative except as stated above  PHYSICAL EXAM: BP 119/69   Pulse 69   Temp 98.5 F (36.9 C) (Oral)   Resp 16   Ht 4\' 9"  (1.448 m)   Wt 252 lb (114.3 kg)   SpO2 95%   BMI 54.53 kg/m   Physical Exam Pulse ox with ambulation on room air is 90 to 93% pulse of 102  General appearance - alert, morbidly obese well appearing, middle-aged Hispanic female and in no distress Mental status - normal mood, behavior, speech, dress, motor  activity, and thought processes Mouth - mucous membranes moist, pharynx normal without lesions Neck - supple, no significant adenopathy Chest - clear to auscultation, no wheezes, rales or rhonchi, symmetric air entry Heart - normal rate, regular rhythm, normal S1, S2, no murmurs, rubs, clicks or gallops.  She has occasional ectopy.  No JVD Extremities -1+ bilateral lower extremity and pedal edema   CMP Latest Ref Rng & Units 01/30/2019 12/13/2018 12/12/2018  Glucose 65 - 99 mg/dL 78 160(V) 371(G)  BUN 6 - 24 mg/dL 11 17 62(I)  Creatinine 0.57 - 1.00 mg/dL 9.48 5.46 2.70  Sodium 134 - 144 mmol/L 142 143 144  Potassium 3.5 - 5.2 mmol/L 5.1 3.6 3.2(L)  Chloride 96 - 106 mmol/L 100 106 112(H)  CO2 20 - 29 mmol/L 27 28 27   Calcium 8.7 - 10.2 mg/dL 9.6 9.0 9.2  Total Protein 6.5 - 8.1 g/dL - - -  Total Bilirubin 0.3 - 1.2 mg/dL - - -  Alkaline Phos 38 - 126 U/L - - -  AST 15 - 41 U/L - - -  ALT 0 - 44 U/L - - -   Lipid Panel  No results found for: CHOL, TRIG, HDL, CHOLHDL, VLDL, LDLCALC, LDLDIRECT  CBC    Component Value Date/Time   WBC 7.4 12/11/2018 0723   RBC 4.66 12/11/2018 0723   HGB 13.3 12/11/2018 0723   HCT 46.0 12/11/2018 0723   PLT 249 12/11/2018 0723   MCV 98.7 12/11/2018 0723   MCH 28.5 12/11/2018 0723   MCHC 28.9 (L) 12/11/2018 0723   RDW 14.3 12/11/2018 0723   LYMPHSABS 1.6 12/03/2018 0316   MONOABS 1.0 12/03/2018 0316   EOSABS 0.1 12/03/2018 0316   BASOSABS 0.0 12/03/2018 0316    ASSESSMENT AND PLAN: 1. Chronic respiratory failure with hypoxia (HCC) -Likely OHS and obstructive sleep apnea contributing However since her oxygen level is staying above 90 we can have her discontinue the home O2.  She has been out of the furosemide.  I have refilled that and told her to use it as needed for the lower extremity edema.  DASH diet discussed and encourage - furosemide (LASIX) 20 MG tablet; Take 1 tablet (20 mg total) by mouth daily as needed. Take 1 tablet as needed for  leg selling. Do not take more than 1 tablet per day  Dispense: 30 tablet; Refill: 1  2. Snores - PSG Sleep Study; Future Advised patient to apply for the orange card/cone discount.  My CMA gave her the applications today.  3. Primary  osteoarthritis of left knee Strongly advised weight loss through changing eating habits and trying to walk 2-3 times a week if only for 10 minutes.  Printed information given on healthy eating habits - diclofenac sodium (VOLTAREN) 1 % GEL; Apply 2 g topically 4 (four) times daily.  Dispense: 100 g; Refill: 1  4. Morbid obesity (HCC) See #3 above  5. Daytime sleepiness - PSG Sleep Study; Future   35 minutes spent on this encounter in face-to-face interaction on diagnosis, discussion of management and coordinating care Patient was given the opportunity to ask questions.  Patient verbalized understanding of the plan and was able to repeat key elements of the plan.  [employer] interpreter used during this encounter.  Orders Placed This Encounter  Procedures  . PSG Sleep Study     Requested Prescriptions   Signed Prescriptions Disp Refills  . diclofenac sodium (VOLTAREN) 1 % GEL 100 g 1    Sig: Apply 2 g topically 4 (four) times daily.  . furosemide (LASIX) 20 MG tablet 30 tablet 1    Sig: Take 1 tablet (20 mg total) by mouth daily as needed. 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 Do not take more than 1 tablet per day    Return in about 7 weeks (around 03/23/2019).  Jonah Blue, MD, FACP

## 2019-02-03 MED FILL — DICLOFENAC SODIUM 1% GEL: 1 | 12 days supply | Qty: 100 | Fill #0

## 2019-02-03 MED FILL — FUROSEMIDE 20 MG TABS: 20 | 30 days supply | Qty: 30 | Fill #0

## 2019-02-12 NOTE — Progress Notes (Signed)
The patient has been notified of the result and verbalized understanding.  All questions (if any) were answered. Dorris Fetch, CMA 02/12/2019 5:05 PM

## 2019-02-25 ENCOUNTER — Telehealth: Payer: Self-pay | Admitting: General Practice

## 2019-02-25 NOTE — Telephone Encounter (Signed)
Unable to LVM need to contact with financial to talk about her application

## 2019-02-26 ENCOUNTER — Ambulatory Visit: Payer: Self-pay | Admitting: General Practice

## 2019-02-26 NOTE — Progress Notes (Deleted)
Cardiology Clinic Note   [name] Date of Encounter: 02/26/2019  Primary Care Provider:  Patient, No Pcp Per Primary Cardiologist:  Thurmon Fair, MD  Patient Profile    Lower extremity edema  Past Medical History    No past medical history on file. Past Surgical History:  Procedure Laterality Date  . No PAST SURGICAL HISTORY      Allergies  No Known Allergies  History of Present Illness    Veronica Castaneda is a 58 y.o. female who presents today for follow-up evaluation of her lower extremity edema.  She has a PMH of hospital  admission 3/6-3/22 with fever cough chills and dyspnea.  During that admission she was found to be hypoxic and have interstitial infiltrates.  She was positive for flu B, required intubation, and ICU admission.  Echocardiogram during admission showed EF 45 to 50%.  She received IV diuresis, this contributed to AKI.  She also received empiric antibiotics (Rocephin).  She was extubated on 3/18 and discharged on 3/21 with 4 L supplemental oxygen to keep oxygen saturation above 92%.  Echocardiogram 12/13/2018  1. The left ventricle has normal systolic function, with an ejection fraction of 55-60%. Left ventricular diastolic Doppler parameters are consistent with impaired relaxation.  2. The right ventricle has normal systolc function.  Patient last seen by Azalee Course on 01/09/2019 for leg swelling.  She was prescribed Lasix 20 mg to use as needed for lower extremity edema.  Today she presents for follow-up.  She states that she has been using Lasix occasionally for her increased edema ().  She has only slight edema today.  She denies chest pain, shortness of breath, dizziness, palpitations, orthopnea and PND.  Home Medications    Prior to Admission medications   Medication Sig Start Date End Date Taking? Authorizing Provider  diclofenac sodium (VOLTAREN) 1 % GEL Apply 2 g topically 4 (four) times daily. 02/02/19    Marcine Matar, MD  furosemide (LASIX) 20 MG tablet Take 1 tablet (20 mg total) by mouth daily as needed. 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 Do not take more than 1 tablet per day 02/02/19 05/03/19  Marcine Matar, MD    Family History    Family History  Problem Relation Age of Onset  . Heart attack Mother   . Heart disease Mother    She indicated that her mother is deceased. She indicated that her father is deceased.  Social History    Social History   Socioeconomic History  . Marital status: Single    Spouse name: Not on file  . Number of children: 3  . Years of education: Not on file  . Highest education level: Not on file  Occupational History  . Occupation: house wife  Social Needs  . Financial resource strain: Not on file  . Food insecurity:    Worry: Not on file    Inability: Not on file  . Transportation needs:    Medical: Not on file    Non-medical: Not on file  Tobacco Use  . Smoking status: Never Smoker  . Smokeless tobacco: Never Used  Substance and Sexual Activity  . Alcohol use: Never    Frequency: Never  . Drug use: Never  . Sexual activity: Not on file  Lifestyle  . Physical activity:    Days per week: Not on file    Minutes per session: Not on file  . Stress: Not on file  Relationships  .  Social connections:    Talks on phone: Not on file    Gets together: Not on file    Attends religious service: Not on file    Active member of club or organization: Not on file    Attends meetings of clubs or organizations: Not on file    Relationship status: Not on file  . Intimate partner violence:    Fear of current or ex partner: Not on file    Emotionally abused: Not on file    Physically abused: Not on file    Forced sexual activity: Not on file  Other Topics Concern  . Not on file  Social History Narrative  . Not on file     Review of Systems    General:  No chills, fever, night sweats or weight changes.  Cardiovascular:  No chest  pain, dyspnea on exertion, edema, orthopnea, palpitations, paroxysmal nocturnal dyspnea. Dermatological: No rash, lesions/masses Respiratory: No cough, dyspnea Urologic: No hematuria, dysuria Abdominal:   No nausea, vomiting, diarrhea, bright red blood per rectum, melena, or hematemesis Neurologic:  No visual changes, wkns, changes in mental status. All other systems reviewed and are otherwise negative except as noted above.  Physical Exam    VS:  There were no vitals taken for this visit. , BMI There is no height or weight on file to calculate BMI. GEN: Well nourished, well developed, in no acute distress. HEENT: normal. Neck: Supple, no JVD, carotid bruits, or masses. Cardiac: RRR, no murmurs, rubs, or gallops. No clubbing, cyanosis, edema.  Radials/DP/PT 2+ and equal bilaterally.  Respiratory:  Respirations regular and unlabored, clear to auscultation bilaterally. GI: Soft, nontender, nondistended, BS + x 4. MS: no deformity or atrophy. Skin: warm and dry, no rash. Neuro:  Strength and sensation are intact. Psych: Normal affect.  Accessory Clinical Findings    ECG personally reviewed by me today- *** - No acute changes  Assessment & Plan   1.  Lower extremity edema Continue Lasix 20 mg tablet daily as needed for increased lower extremity edema Low-sodium diet Elevate extremities throughout the day for at least 30 minutes Consider support stockings for daytime use.  2.  Nonischemic cardiomyopathy with normalized EF Previous decline in EF is likely related to stress from flu B and acute respiratory failure. No complaints of chest discomfort, ejection fraction normalized. Hold off on ischemic evaluation  Ronney Asters, NP 02/26/2019, 3:13 PM

## 2019-03-02 ENCOUNTER — Telehealth: Payer: Self-pay | Admitting: Internal Medicine

## 2019-03-02 NOTE — Telephone Encounter (Signed)
Patient called stating that she no longer uses the oxygen machine and she called the company so it could be picked up. Patient stated that she was told they could not pick up the machine until patient PCP authorize the pick up. Please f/u

## 2019-03-02 NOTE — Telephone Encounter (Signed)
Will forward to pcp

## 2019-03-04 NOTE — Telephone Encounter (Signed)
Would you be able to contact pt and see what medical supply store she received her oxygen from so I can fax order to discontinue it

## 2019-03-05 NOTE — Telephone Encounter (Signed)
Attempted to contact patient no answer LVM to call back

## 2019-03-05 NOTE — Telephone Encounter (Signed)
[facility] to [facility]

## 2019-03-05 NOTE — Telephone Encounter (Signed)
Patient called states she received the oxygen from [facility]

## 2019-03-05 NOTE — Telephone Encounter (Signed)
Will route again

## 2019-03-07 ENCOUNTER — Encounter (HOSPITAL_BASED_OUTPATIENT_CLINIC_OR_DEPARTMENT_OTHER): Payer: Self-pay

## 2019-03-17 ENCOUNTER — Telehealth: Payer: Self-pay | Admitting: Internal Medicine

## 2019-03-17 NOTE — Telephone Encounter (Signed)
Patient called stating that she NO LONGER uses the oxygen machine and she called the company so it could be picked up. Patient stated that she was told they could not pick up the machine until patient PCP authorize the pick up. Please f/u

## 2019-03-18 NOTE — Telephone Encounter (Signed)
Please check on patients request for O2 to be picked up. This was completed by PCP/RMA last month.

## 2019-03-19 NOTE — Telephone Encounter (Signed)
Call placed to [facility], spoke to [place] regarding the order to discontinue O2.  She stated that they received the order and she is not sure what happened/ why it was not picked up. Nothing else needed from provider. She said that she would take care of it today.

## 2019-07-07 ENCOUNTER — Encounter: Payer: Self-pay | Admitting: Internal Medicine

## 2019-08-17 ENCOUNTER — Ambulatory Visit: Payer: Self-pay | Attending: Internal Medicine | Admitting: Internal Medicine

## 2019-08-17 ENCOUNTER — Other Ambulatory Visit: Payer: Self-pay

## 2019-08-17 ENCOUNTER — Encounter: Payer: Self-pay | Admitting: Internal Medicine

## 2019-08-17 ENCOUNTER — Ambulatory Visit (HOSPITAL_BASED_OUTPATIENT_CLINIC_OR_DEPARTMENT_OTHER): Payer: Self-pay | Admitting: Pharmacist

## 2019-08-17 VITALS — BP 115/77 | HR 75 | Temp 98.9°F | Resp 16 | Ht <= 58 in | Wt 262.0 lb

## 2019-08-17 DIAGNOSIS — Z1211 Encounter for screening for malignant neoplasm of colon: Secondary | ICD-10-CM

## 2019-08-17 DIAGNOSIS — Z1231 Encounter for screening mammogram for malignant neoplasm of breast: Secondary | ICD-10-CM

## 2019-08-17 DIAGNOSIS — Z23 Encounter for immunization: Secondary | ICD-10-CM

## 2019-08-17 DIAGNOSIS — Z1159 Encounter for screening for other viral diseases: Secondary | ICD-10-CM

## 2019-08-17 DIAGNOSIS — Z6841 Body Mass Index (BMI) 40.0 and over, adult: Secondary | ICD-10-CM

## 2019-08-17 DIAGNOSIS — Z0000 Encounter for general adult medical examination without abnormal findings: Secondary | ICD-10-CM

## 2019-08-17 DIAGNOSIS — Z124 Encounter for screening for malignant neoplasm of cervix: Secondary | ICD-10-CM

## 2019-08-17 DIAGNOSIS — Z0001 Encounter for general adult medical examination with abnormal findings: Secondary | ICD-10-CM

## 2019-08-17 NOTE — Progress Notes (Signed)
Patient presents for vaccination against influenza and tetanus per orders of Dr. Johnson. Consent given. Counseling provided. No contraindications exists. Vaccine administered without incident.   

## 2019-08-17 NOTE — Patient Instructions (Addendum)
Vacuna Td (contra el ttanos y la difteria): lo que debe saber Td Vaccine (Tetanus and Diphtheria): What You Need to Know 1. Por qu vacunarse? El ttanos y la difteria son enfermedades muy graves. Son poco frecuentes en los [place] actualmente, pero las personas que se infectan suelen tener complicaciones graves. La vacuna Td se usa para proteger a los adolescentes y a los adultos de ambas enfermedades. Tanto el ttanos como la difteria son infecciones causadas por bacterias. La difteria se transmite de persona a persona a travs de la tos o el estornudo. Las bacterias que causan el ttanos entran en el cuerpo a travs de cortes, raspones o heridas. El TTANOS (trismo) provoca entumecimiento y contraccin dolorosa de los msculos, por lo general, en todo el cuerpo.  Puede causar el endurecimiento de los msculos de la cabeza y el cuello, de modo que impide abrir la boca, tragar y, en algunos casos, incluso respirar. El ttanos es causa de muerte en aproximadamente 1de cada 10personas que contraen la infeccin, incluso despus de que reciben la mejor atencin mdica. La DIFTERIA puede hacer que se forme una membrana gruesa en la parte posterior de la garganta.  Puede causar problemas respiratorios, parlisis, insuficiencia cardaca e incluso la muerte. Antes de las vacunas, en los [place] se informaban 200000 casos de difteria y cientos de casos de ttanos cada ao. Desde que comenz la vacunacin, los informes de casos de ambas enfermedades se han reducido en un 99%. 2. Vacuna Td La vacuna Td puede proteger a adolescentes y adultos contra el ttanos y la difteria. La vacuna Td habitualmente se aplica como dosis de refuerzo cada 10aos, pero tambin puede administrarse antes si la persona sufre una quemadura o herida sucia y grave. A veces, en lugar de la vacuna Td, se recomienda una vacuna llamada Tdap, que protege contra la tos ferina, adems de proteger contra el ttanos y la  difteria. El mdico o la persona que le aplique la vacuna puede darle ms informacin al respecto. La vacuna Td puede administrarse de manera segura simultneamente con otras vacunas. 3. Algunas personas no deben recibir esta vacuna  Una persona que alguna vez ha tenido una reaccin alrgica potencialmente mortal a una dosis anterior de cualquier vacuna contra el ttanos o la difteria, O que tenga una alergia grave a cualquier parte de esta vacuna, no debe recibir la vacuna Td. Informe a la persona que le aplica la vacuna si tiene cualquier alergia grave.  Consulte con su mdico si: ? tuvo hinchazn o dolor intenso despus de recibir cualquier vacuna contra la difteria o el ttanos, ? alguna vez ha sufrido el sndrome de Guillain-Barr (SGB), ? no se siente bien el da en que se ha programado la vacuna. 4. Riesgos de una reaccin a la vacuna Con cualquier medicamento, incluso las vacunas, existe la posibilidad de que aparezcan efectos secundarios. Suelen ser leves y desaparecen por s solos. Si bien es posible tener reacciones graves, estas son muy raras. La mayora de las personas a las que se les aplica la vacuna Td no tienen ningn problema. Problemas leves despus de la vacuna Td: (No interfirieron en las actividades)  Dolor en el lugar donde se aplic la vacuna (alrededor de 8de cada 10personas)  Enrojecimiento o hinchazn en el lugar donde se aplic la vacuna (alrededor de 1de cada 4personas)  Fiebre leve (poco frecuente)  Dolor de cabeza (alrededor de 1de cada 4personas)  Cansancio (alrededor de 1de cada 4personas) Problemas moderados despus de la vacuna   Td: (Interfirieron en las actividades, pero no exigieron atencin mdica)  Fiebre superior a 102F (39C) (poco frecuente) Problemas graves despus de la vacuna Td: (Impidieron realizar las actividades habituales y exigieron atencin mdica)  Hinchazn, dolor intenso, sangrado o enrojecimiento en el brazo en que se  aplic la vacuna (poco frecuente). Problemas que podran ocurrir despus de cualquier vacuna:  A veces, las personas se desmayan despus de un procedimiento mdico, incluida la vacunacin. Permanecer sentado o recostado durante 15minutos puede ayudar a evitar los desmayos y las lesiones causadas por las cadas. Informe al mdico si se siente mareado, tiene cambios en la visin o zumbidos en los odos.  Algunas personas sienten un dolor intenso en el hombro y tienen dificultad para mover el brazo donde se aplic la vacuna. Esto sucede con muy poca frecuencia.  Cualquier medicamento puede causar una reaccin alrgica grave. Dichas reacciones son muy poco frecuentes con una vacuna (se calcula que menos de 1en un milln de dosis) y se producen entre unos minutos y unas horas despus de la vacunacin. Al igual que con cualquier medicamento, existe una probabilidad muy remota de que una vacuna cause una lesin grave o la muerte. La seguridad de las vacunas se controla permanentemente. Para obtener ms informacin, visite: www.cdc.gov/vaccinesafety/ 5. Qu pasa si hay una reaccin grave? A qu signos debo estar atento?  Est atento a la aparicin de signos preocupantes, como los de una reaccin alrgica grave, fiebre muy alta o comportamiento fuera de lo normal. Los signos de una reaccin alrgica grave pueden incluir ronchas, hinchazn de la cara y la garganta, dificultad para respirar, latidos cardacos acelerados, mareos y debilidad. Generalmente, estos comienzan entre unos pocos minutos y algunas horas despus de la vacunacin. Qu debo hacer?  Si usted piensa que se trata de una reaccin alrgica grave o de otra emergencia que no puede esperar, llame al 9-1-1 o dirjase al hospital ms cercano. De lo contrario, llame al mdico.  Despus, la reaccin debe informarse al Sistema de Informe de Eventos Adversos de Vacunas (Vaccine Adverse Event Reporting System, VAERS). El mdico puede presentar  este informe, o bien puede hacerlo usted mismo a travs del sitio web de VAERS, en www.vaers.hhs.gov, o llamando al 1-800-822-7967. El Sistema de Informe de Eventos Adversos de Vacunas no brinda recomendaciones mdicas. 6. Programa Nacional de Compensacin de Daos por Vacunas El Programa Nacional de Compensacin de Daos por Vacunas (National Vaccine Injury Compensation Program, VICP) es un programa federal que fue creado para compensar a las personas que puedan haber sufrido daos al recibir ciertas vacunas. Aquellas personas que consideren que han sufrido un dao como consecuencia de una vacuna y quieran saber ms acerca del programa y de cmo presentar un reclamo, pueden llamar al 1-800-338-2382 o visitar el sitio web del VICP en www.hrsa.gov/vaccinecompensation. Hay un lmite de tiempo para presentar un reclamo de compensacin. 7. Cmo puedo obtener ms informacin?  Consulte a su mdico. Este puede darle el prospecto de la vacuna o recomendarle otras fuentes de informacin.  Comunquese con el servicio de salud de su localidad o su estado.  Comunquese con los Centers for Disease Control and Prevention, CDC (Centros para el Control y la Prevencin de Enfermedades): ? Llame al 1-800-232-4636 (1-800-CDC-INFO) ? Visite el sitio web de los CDC en www.cdc.gov/vaccines Declaracin de informacin sobre la vacuna Td (07/28/2016) Esta informacin no tiene como fin reemplazar el consejo del mdico. Asegrese de hacerle al mdico cualquier pregunta que tenga. Document Released: 12/27/2008 Document Revised: 05/14/2018 Document Reviewed: 05/14/2018   Elsevier Interactive Patient Education  2020 Elsevier Inc.   Influenza Virus Vaccine injection (Fluarix) Qu es este medicamento? La VACUNA ANTIGRIPAL ayuda a disminuir el riesgo de contraer la influenza, tambin conocida como la gripe. La vacuna solo ayuda a protegerle contra algunas cepas de influenza. Esta vacuna no ayuda a reducir el riesgo de  contraer influenza pandmica H1N1. Este medicamento puede ser utilizado para otros usos; si tiene alguna pregunta consulte con su proveedor de atencin mdica o con su farmacutico. MARCAS COMUNES: Fluarix, Fluzone Qu le debo informar a mi profesional de la salud antes de tomar este medicamento? Necesita saber si usted presenta alguno de los siguientes problemas o situaciones:  trastorno de sangrado como hemofilia  fiebre o infeccin  sndrome de Guillain-Barre u otros problemas neurolgicos  problemas del sistema inmunolgico  infeccin por el virus de la inmunodeficiencia humana (VIH) o SIDA  niveles bajos de plaquetas en la sangre  esclerosis mltiple  una reaccin alrgica o inusual a las vacunas antigripales, a los huevos, protenas de pollo, al ltex, a la gentamicina, a otros medicamentos, alimentos, colorantes o conservantes  si est embarazada o buscando quedar embarazada  si est amamantando a un beb Cmo debo utilizar este medicamento? Esta vacuna se administra mediante inyeccin por va intramuscular. Lo administra un profesional de la salud. Recibir una copia de informacin escrita sobre la vacuna antes de cada vacuna. Asegrese de leer este folleto cada vez cuidadosamente. Este folleto puede cambiar con frecuencia. Hable con su pediatra para informarse acerca del uso de este medicamento en nios. Puede requerir atencin especial. Sobredosis: Pngase en contacto inmediatamente con un centro toxicolgico o una sala de urgencia si usted cree que haya tomado demasiado medicamento. ATENCIN: Este medicamento es solo para usted. No comparta este medicamento con nadie. Qu sucede si me olvido de una dosis? No se aplica en este caso. Qu puede interactuar con este medicamento?  quimioterapia o radioterapia  medicamentos que suprimen el sistema inmunolgico, tales como etanercept, anakinra, infliximab y adalimumab  medicamentos que tratan o previenen cogulos  sanguneos, como warfarina  fenitona  medicamentos esteroideos, como la prednisona o la cortisona  teofilina  vacunas Puede ser que esta lista no menciona todas las posibles interacciones. Informe a su profesional de la salud de todos los productos a base de hierbas, medicamentos de venta libre o suplementos nutritivos que est tomando. Si usted fuma, consume bebidas alcohlicas o si utiliza drogas ilegales, indqueselo tambin a su profesional de la salud. Algunas sustancias pueden interactuar con su medicamento. A qu debo estar atento al usar este medicamento? Informe a su mdico o a su profesional de la salud sobre todos los efectos secundarios que persistan despus de 3 das. Llame a su proveedor de atencin mdica si se presentan sntomas inusuales dentro de las 6 semanas posteriores a la vacunacin. Es posible que todava pueda contraer la gripe, pero la enfermedad no ser tan fuerte como normalmente. No puede contraer la gripe de esta vacuna. La vacuna antigripal no le protege contra resfros u otras enfermedades que pueden causar fiebre. Debe vacunarse cada ao. Qu efectos secundarios puedo tener al utilizar este medicamento? Efectos secundarios que debe informar a su mdico o a su profesional de la salud tan pronto como sea posible:  reacciones alrgicas como erupcin cutnea, picazn o urticarias, hinchazn de la cara, labios o lengua Efectos secundarios que, por lo general, no requieren atencin mdica (debe informarlos a su mdico o a su profesional de la salud si persisten o si son molestos):    fiebre  dolor de cabeza  molestias y dolores musculares  dolor, sensibilidad, enrojecimiento o hinchazn en el lugar de la inyeccin  cansancio o debilidad Puede ser que esta lista no menciona todos los posibles efectos secundarios. Comunquese a su mdico por asesoramiento mdico sobre los efectos secundarios. Usted puede informar los efectos secundarios a la FDA por telfono al  1-800-FDA-1088. Dnde debo guardar mi medicina? Esta vacuna se administra solamente en clnicas, farmacias, consultorio mdico u otro consultorio de un profesional de la salud y no necesitar guardarlo en su domicilio. ATENCIN: Este folleto es un resumen. Puede ser que no cubra toda la posible informacin. Si usted tiene preguntas acerca de esta medicina, consulte con su mdico, su farmacutico o su profesional de la salud.  2020 Elsevier/Gold Standard (2010-03-14 15:31:40)  

## 2019-08-17 NOTE — Progress Notes (Signed)
Patient ID: Veronica Castaneda, female    DOB: January 04, 1961  MRN: [id number]  CC: Annual Exam   Subjective: Veronica Castaneda is a 58 y.o. female who presents for annual exam Her concerns today include:  Pt with obesity, OA LT knee, likely OSA/OHS  Pt due for pap/MMG/flu shot/tdap  Pt is G3P3.  Last PAP was 6 yrs ago.  No abn in past. She is postmenopausal No vaginal dischg or itching at this time.  Sexually active only with spouse.   No abn MMG in pas.  Last MMG was in 2014 No fhx of breast, uterine. 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 Acute respiratory failure (HCC) 11/28/2018     Current Outpatient Medications on File Prior to Visit  Medication Sig Dispense Refill  . diclofenac sodium (VOLTAREN) 1 % GEL Apply 2 g topically 4 (four) times daily. (Patient not taking: Reported on 08/17/2019) 100 g 1  . furosemide (LASIX) 20 MG tablet Take 1 tablet (20 mg total) by mouth daily as needed. 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 Do not take more than 1 tablet per day 30 tablet 1   No current facility-administered medications on file prior to visit.     No Known Allergies  Social History   Socioeconomic History  . Marital status: Single    Spouse name: Not on file  . Number of children: 3  . Years of education: Not on file  . Highest education level: Not on file  Occupational History  . Occupation: house wife  Social Needs  . Financial resource strain: Not on file  . Food insecurity    Worry: Not on file    Inability: Not on file  . Transportation needs    Medical: Not on file    Non-medical: Not on file  Tobacco Use  . Smoking status: Never Smoker  . Smokeless tobacco: Never Used  Substance and Sexual Activity  . Alcohol use: Never    Frequency: Never  . Drug use: Never  . Sexual activity: Not on file   Lifestyle  . Physical activity    Days per week: Not on file    Minutes per session: Not on file  . Stress: Not on file  Relationships  . Social Musician on phone: Not on file    Gets together: Not on file    Attends religious service: Not on file    Active member of club or organization: Not on file    Attends meetings of clubs or organizations: Not on file    Relationship status: Not on file  . Intimate partner violence    Fear of current or ex partner: Not on file    Emotionally abused: Not on file    Physically abused: Not on file    Forced sexual activity: Not on file  Other Topics Concern  . Not on file  Social History Narrative  . Not on file    Family History  Problem Relation Age of Onset  . Heart attack Mother   . Heart disease Mother     Past Surgical History:  Procedure Laterality Date  . No PAST SURGICAL HISTORY      ROS: Review of Systems Negative except as stated above  PHYSICAL EXAM: BP 115/77   Pulse 75  Temp 98.9 F (37.2 C) (Oral)   Resp 16   Ht 4\' 9"  (1.448 m)   Wt 262 lb (118.8 kg)   SpO2 95%   BMI 56.70 kg/m   Physical Exam General appearance - alert, well appearing, morbidly obese female and in no distress Mental status - normal mood, behavior, speech, dress, motor activity, and thought processes Eyes - pupils equal and reactive, extraocular eye movements intact Nose - normal and patent, no erythema, discharge or polyps Mouth - mucous membranes moist, pharynx normal without lesions Neck - supple, no significant adenopathy Lymphatics - no palpable lymphadenopathy, no hepatosplenomegaly Chest - clear to auscultation, no wheezes, rales or rhonchi, symmetric air entry Heart - normal rate, regular rhythm, normal S1, S2, no murmurs, rubs, clicks or gallops Abdomen - obese, normal bowel sounds, nontender Breasts - breasts appear normal, no suspicious masses, no skin or nipple changes or axillary nodes Pelvic - normal external  genitalia, vulva, vagina, cervix, uterus and adnexa.  White watery discharge in the vaginal vault. Extremities - legs - large body habitus, slight pedal edema    CMP Latest Ref Rng & Units 01/30/2019 12/13/2018 12/12/2018  Glucose 65 - 99 mg/dL 78 779(T) 903(E)  BUN 6 - 24 mg/dL 11 17 09(Q)  Creatinine 0.57 - 1.00 mg/dL 3.30 0.76 2.26  Sodium 134 - 144 mmol/L 142 143 144  Potassium 3.5 - 5.2 mmol/L 5.1 3.6 3.2(L)  Chloride 96 - 106 mmol/L 100 106 112(H)  CO2 20 - 29 mmol/L 27 28 27   Calcium 8.7 - 10.2 mg/dL 9.6 9.0 9.2  Total Protein 6.5 - 8.1 g/dL - - -  Total Bilirubin 0.3 - 1.2 mg/dL - - -  Alkaline Phos 38 - 126 U/L - - -  AST 15 - 41 U/L - - -  ALT 0 - 44 U/L - - -   Lipid Panel  No results found for: CHOL, TRIG, HDL, CHOLHDL, VLDL, LDLCALC, LDLDIRECT  CBC    Component Value Date/Time   WBC 7.4 12/11/2018 0723   RBC 4.66 12/11/2018 0723   HGB 13.3 12/11/2018 0723   HCT 46.0 12/11/2018 0723   PLT 249 12/11/2018 0723   MCV 98.7 12/11/2018 0723   MCH 28.5 12/11/2018 0723   MCHC 28.9 (L) 12/11/2018 0723   RDW 14.3 12/11/2018 0723   LYMPHSABS 1.6 12/03/2018 0316   MONOABS 1.0 12/03/2018 0316   EOSABS 0.1 12/03/2018 0316   BASOSABS 0.0 12/03/2018 0316    ASSESSMENT AND PLAN: 1. Annual physical exam  2. Morbid obesity (HCC) Discussed the importance of healthy eating habits.  Advised patient to eliminate sugary drinks from the diet, cut back on white carbohydrates, try to eat more lean white meat than red meat and incorporate fresh fruits and vegetables into the diet.  Encouraged her to move more. - CBC - Comprehensive metabolic panel - Lipid panel - Hemoglobin A1c  3. Pap smear for cervical cancer screening - Cervicovaginal ancillary only - Cytology - PAP([facility])  4. Need for Tdap vaccination 5. Need for influenza vaccination Given  6. Screening for colon cancer Discussed colon cancer screening and methods of screening.  She is agreeable to doing the fit  test - Fecal occult blood, imunochemical([facility])  7. Need for hepatitis C screening test - Hepatitis C Antibody  8. Encounter for screening mammogram for malignant neoplasm of breast Given scholarship for mammogram - MM Digital Screening; Future  At the end of the visit patient reported having a small bump above the  umbilicus which has been there since March of this year and is sometimes bothersome to her.  Advised patient that we will need to address this on follow-up visit.  I will have them schedule her to come back in 2 to 4 weeks for further evaluation.  Patient was given the opportunity to ask questions.  Patient verbalized understanding of the plan and was able to repeat key elements of the plan.  [employer] interpreter used during this encounter. #[id number]  Orders Placed This Encounter  Procedures  . Fecal occult blood, imunochemical([facility])  . MM Digital Screening  . CBC  . Comprehensive metabolic panel  . Lipid panel  . Hemoglobin A1c  . Hepatitis C Antibody     Requested Prescriptions    No prescriptions requested or ordered in this encounter    Return in about 3 weeks (around 09/07/2019) for in person 3-4 wks for eval of lump above umbilicus.  Karle Plumber, MD, FACP

## 2019-08-18 ENCOUNTER — Encounter: Payer: Self-pay | Admitting: Internal Medicine

## 2019-08-18 ENCOUNTER — Other Ambulatory Visit: Payer: Self-pay | Admitting: Internal Medicine

## 2019-08-18 DIAGNOSIS — R7303 Prediabetes: Secondary | ICD-10-CM | POA: Insufficient documentation

## 2019-08-18 DIAGNOSIS — E785 Hyperlipidemia, unspecified: Secondary | ICD-10-CM | POA: Insufficient documentation

## 2019-08-18 LAB — CBC
Hematocrit: 43.7 % (ref 34.0–46.6)
Hemoglobin: 14.3 g/dL (ref 11.1–15.9)
MCH: 30.8 pg (ref 26.6–33.0)
MCHC: 32.7 g/dL (ref 31.5–35.7)
MCV: 94 fL (ref 79–97)
Platelets: 220 10*3/uL (ref 150–450)
RBC: 4.64 x10E6/uL (ref 3.77–5.28)
RDW: 11.9 % (ref 11.7–15.4)
WBC: 7.3 10*3/uL (ref 3.4–10.8)

## 2019-08-18 LAB — COMPREHENSIVE METABOLIC PANEL
ALT: 11 IU/L (ref 0–32)
AST: 15 IU/L (ref 0–40)
Albumin/Globulin Ratio: 1.1 — ABNORMAL LOW (ref 1.2–2.2)
Albumin: 3.7 g/dL — ABNORMAL LOW (ref 3.8–4.9)
Alkaline Phosphatase: 114 IU/L (ref 39–117)
BUN/Creatinine Ratio: 25 — ABNORMAL HIGH (ref 9–23)
BUN: 13 mg/dL (ref 6–24)
Bilirubin Total: 0.3 mg/dL (ref 0.0–1.2)
CO2: 28 mmol/L (ref 20–29)
Calcium: 9 mg/dL (ref 8.7–10.2)
Chloride: 101 mmol/L (ref 96–106)
Creatinine, Ser: 0.52 mg/dL — ABNORMAL LOW (ref 0.57–1.00)
GFR calc Af Amer: 124 mL/min/{1.73_m2} (ref >59)
GFR calc non Af Amer: 108 mL/min/{1.73_m2} (ref >59)
Globulin, Total: 3.4 g/dL (ref 1.5–4.5)
Glucose: 81 mg/dL (ref 65–99)
Potassium: 4.3 mmol/L (ref 3.5–5.2)
Sodium: 141 mmol/L (ref 134–144)
Total Protein: 7.1 g/dL (ref 6.0–8.5)

## 2019-08-18 LAB — LIPID PANEL
Chol/HDL Ratio: 6 ratio — ABNORMAL HIGH (ref 0.0–4.4)
Cholesterol, Total: 278 mg/dL — ABNORMAL HIGH (ref 100–199)
HDL: 46 mg/dL (ref >39)
LDL Chol Calc (NIH): 170 mg/dL — ABNORMAL HIGH (ref 0–99)
Triglycerides: 322 mg/dL — ABNORMAL HIGH (ref 0–149)
VLDL Cholesterol Cal: 62 mg/dL — ABNORMAL HIGH (ref 5–40)

## 2019-08-18 LAB — CERVICOVAGINAL ANCILLARY ONLY
Chlamydia: NEGATIVE
Comment: NEGATIVE
Comment: NEGATIVE
Comment: NORMAL
Neisseria Gonorrhea: NEGATIVE
Trichomonas: NEGATIVE

## 2019-08-18 LAB — HEPATITIS C ANTIBODY: Hep C Virus Ab: <0.1 s/co ratio (ref 0.0–0.9)

## 2019-08-18 LAB — HEMOGLOBIN A1C
Est. average glucose Bld gHb Est-mCnc: 131 mg/dL
Hgb A1c MFr Bld: 6.2 % — ABNORMAL HIGH (ref 4.8–5.6)

## 2019-08-18 MED ORDER — METFORMIN HCL 500 MG PO TABS: 250.0000 mg | ORAL_TABLET | Freq: Every day | ORAL | 3 refills | Status: DC

## 2019-08-19 ENCOUNTER — Telehealth: Payer: Self-pay

## 2019-08-19 LAB — CYTOLOGY - PAP
Diagnosis: UNDETERMINED — AB
High risk HPV: NEGATIVE

## 2019-08-19 NOTE — Telephone Encounter (Signed)
[employer]  Christean Grief Id#  [id number] contacted pt to go over lab results pt didn't answer and was unable to lvm due to vm being full

## 2019-09-03 LAB — FECAL OCCULT BLOOD, IMMUNOCHEMICAL

## 2019-09-04 ENCOUNTER — Other Ambulatory Visit (HOSPITAL_COMMUNITY): Payer: Self-pay | Admitting: *Deleted

## 2019-09-04 DIAGNOSIS — Z1231 Encounter for screening mammogram for malignant neoplasm of breast: Secondary | ICD-10-CM

## 2019-10-02 ENCOUNTER — Other Ambulatory Visit: Payer: Self-pay

## 2019-10-02 ENCOUNTER — Ambulatory Visit: Payer: Self-pay | Attending: Internal Medicine | Admitting: Internal Medicine

## 2019-10-02 DIAGNOSIS — R7303 Prediabetes: Secondary | ICD-10-CM

## 2019-10-02 DIAGNOSIS — R0683 Snoring: Secondary | ICD-10-CM

## 2019-10-02 DIAGNOSIS — E782 Mixed hyperlipidemia: Secondary | ICD-10-CM

## 2019-10-02 NOTE — Progress Notes (Signed)
Virtual Visit via Telephone Note Due to current restrictions/limitations of in-office visits due to the COVID-19 pandemic, this scheduled clinical appointment was converted to a telehealth visit  I connected with Veronica Castaneda on 10/02/19 at 12:02 p.m by telephone and verified that I am speaking with the correct person using two identifiers. I am in my office.  The patient is at home.  Only the patient, myself and [place] from [employer] (#[id number])participated in this encounter.  I discussed the limitations, risks, security and privacy concerns of performing an evaluation and management service by telephone and the availability of in person appointments. I also discussed with the patient that there may be a patient responsible charge related to this service. The patient expressed understanding and agreed to proceed.   History of Present Illness: Pt with morbid obesity, OA LT knee, likely OSA/OHS.  At the end of last visit patient reported having a small bump above the umbilicus which has been there since March of this year and is sometimes bothersome to her.  Advised patient that we will need to address this on follow-up visit. However in person visit changed to virtual today due to inclement weather.    I went over labs from last visit.  She has preDM with A1C 6.2. LDL was elevated at 170.  ASCVD risk score was 2.7% she is taking Metformin as prescribed.  C/o increase daytime sleepiness with loud snoring.  Referred for sleep study in 01/2019.  However pt reports she was never called.  She will be meeting with our financial specialist later this mth to try get approved for [facility] discount  Current Outpatient Medications  Medication Instructions  . diclofenac sodium (VOLTAREN) 2 g, Topical, 4 times daily  . furosemide (LASIX) 20 mg, Oral, Daily PRN, Take 1 tablet as needed for leg selling. Do not take more than 1 tablet per day  . metFORMIN (GLUCOPHAGE) 250 mg, Oral, Daily  with breakfast      Observations/Objective: Results for orders placed or performed in visit on 08/17/19  Fecal occult blood, imunochemical([facility])   Specimen: Blood   BLD  Result Value Ref Range   Fecal Occult Bld CANCELED   CBC  Result Value Ref Range   WBC 7.3 3.4 - 10.8 x10E3/uL   RBC 4.64 3.77 - 5.28 x10E6/uL   Hemoglobin 14.3 11.1 - 15.9 g/dL   Hematocrit 43.7 34.0 - 46.6 %   MCV 94 79 - 97 fL   MCH 30.8 26.6 - 33.0 pg   MCHC 32.7 31.5 - 35.7 g/dL   RDW 11.9 11.7 - 15.4 %   Platelets 220 150 - 450 x10E3/uL  Comprehensive metabolic panel  Result Value Ref Range   Glucose 81 65 - 99 mg/dL   BUN 13 6 - 24 mg/dL   Creatinine, Ser 0.52 (L) 0.57 - 1.00 mg/dL   GFR calc non Af Amer 108 >59 mL/min/1.73   GFR calc Af Amer 124 >59 mL/min/1.73   BUN/Creatinine Ratio 25 (H) 9 - 23   Sodium 141 134 - 144 mmol/L   Potassium 4.3 3.5 - 5.2 mmol/L   Chloride 101 96 - 106 mmol/L   CO2 28 20 - 29 mmol/L   Calcium 9.0 8.7 - 10.2 mg/dL   Total Protein 7.1 6.0 - 8.5 g/dL   Albumin 3.7 (L) 3.8 - 4.9 g/dL   Globulin, Total 3.4 1.5 - 4.5 g/dL   Albumin/Globulin Ratio 1.1 (L) 1.2 - 2.2   Bilirubin Total 0.3 0.0 - 1.2 mg/dL  Alkaline Phosphatase 114 39 - 117 IU/L   AST 15 0 - 40 IU/L   ALT 11 0 - 32 IU/L  Lipid panel  Result Value Ref Range   Cholesterol, Total 278 (H) 100 - 199 mg/dL   Triglycerides 503 (H) 0 - 149 mg/dL   HDL 46 >54 mg/dL   VLDL Cholesterol Cal 62 (H) 5 - 40 mg/dL   LDL Chol Calc (NIH) 656 (H) 0 - 99 mg/dL   Chol/HDL Ratio 6.0 (H) 0.0 - 4.4 ratio  Hemoglobin A1c  Result Value Ref Range   Hgb A1c MFr Bld 6.2 (H) 4.8 - 5.6 %   Est. average glucose Bld gHb Est-mCnc 131 mg/dL  Hepatitis C Antibody  Result Value Ref Range   Hep C Virus Ab <0.1 0.0 - 0.9 s/co ratio  Cervicovaginal ancillary only  Result Value Ref Range   Neisseria Gonorrhea Negative    Chlamydia Negative    Trichomonas Negative    Comment Normal Reference Range Trichomonas - Negative     Comment Normal Reference Ranger Chlamydia - Negative    Comment      Normal Reference Range Neisseria Gonorrhea - Negative  Cytology - PAP([facility])  Result Value Ref Range   High risk HPV Negative    Adequacy      Satisfactory for evaluation; transformation zone component PRESENT.   Diagnosis (A)     - Atypical squamous cells of undetermined significance (ASC-US)   Microorganisms Shift in flora suggestive of bacterial vaginosis      Assessment and Plan: 1. Prediabetes Went over healthy eating habits with her.  Encourage regular exercise several times a week.  Continue Metformin.  2. Mixed hyperlipidemia See #1 above.  We will plan to recheck lipid profile in several months to see whether there has been improvement  3. Loud snoring I have resubmitted referral for sleep study.   - [facility]G Sleep Study; Future   Follow Up Instructions: Next available in person visit to evaluate the periumbilical area   I discussed the assessment and treatment plan with the patient. The patient was provided an opportunity to ask questions and all were answered. The patient agreed with the plan and demonstrated an understanding of the instructions.   The patient was advised to call back or seek an in-person evaluation if the symptoms worsen or if the condition fails to improve as anticipated.  I provided 11 minutes of non-face-to-face time during this encounter.   Jonah Blue, MD

## 2019-10-22 ENCOUNTER — Ambulatory Visit (HOSPITAL_COMMUNITY)
Admission: RE | Admit: 2019-10-22 | Discharge: 2019-10-22 | Disposition: A | Discharge status: Home / Self Care | Payer: Self-pay | Source: Ambulatory Visit | Attending: Obstetrics and Gynecology | Admitting: Obstetrics and Gynecology

## 2019-10-22 ENCOUNTER — Encounter (HOSPITAL_COMMUNITY): Payer: Self-pay

## 2019-10-22 ENCOUNTER — Other Ambulatory Visit: Payer: Self-pay

## 2019-10-22 DIAGNOSIS — Z1239 Encounter for other screening for malignant neoplasm of breast: Secondary | ICD-10-CM | POA: Insufficient documentation

## 2019-10-22 HISTORY — DX: Hyperlipidemia, unspecified: E78.5

## 2019-10-22 NOTE — Progress Notes (Signed)
No complaints today.   Pap Smear: Pap smear not completed today. Last Pap smear was 08/17/2019 and ASCUS with negative HPV and BV. Per patient her last Pap smear is the only abnormal Pap smear she has had. Last Pap smear result is in Epic.  Physical exam: Breasts Breasts symmetrical. No skin abnormalities bilateral breasts. No nipple retraction bilateral breasts. No nipple discharge bilateral breasts. No lymphadenopathy. No lumps palpated bilateral breasts. No complaints of pain or tenderness on exam. Referred patient to the [facility] for a screening mammogram. Appointment scheduled for Monday, October 26, 2019 at 1040.        Pelvic/Bimanual No Pap smear completed today since last Pap smear was 08/17/2019. Pap smear not indicated per BCCCP guidelines.   Smoking History: Patient has never smoked.  Patient Navigation: Patient education provided. Access to services provided for patient through [facility] program. Spanish interpreter provided.   Colorectal Cancer Screening: Per patient has never had a colonoscopy completed. No complaints today.   Breast and Cervical Cancer Risk Assessment: Patient has no family history of breast cancer, known genetic mutations, or radiation treatment to the chest before age 56. Patient has no history of cervical dysplasia, immunocompromised, or DES exposure in-utero.  Risk Assessment    Risk Scores      10/22/2019   Last edited by: Narda Rutherford, LPN   5-year risk: 0.9 %   Lifetime risk: 6.2 %         Used Spanish interpreter Natale Lay from [place].

## 2019-10-22 NOTE — Patient Instructions (Signed)
Explained breast self awareness with Veronica Castaneda. Patient did not need a Pap smear today due to last Pap smear was 08/17/2019. Let patient know that her next Pap smear is due the end of November 2021 due to her last Pap smear was abnormal. Referred patient to the [facility] for a screening mammogram. Appointment scheduled for Monday, October 26, 2019 at 1040. Patient aware of appointment and will be there. Let patient know the Breast Center will follow up with her within the next couple weeks with results of her mammogram by letter or phone. Veronica Castaneda verbalized understanding.  Veronica Castaneda, Veronica Maser, RN 1:37 PM

## 2019-10-23 ENCOUNTER — Ambulatory Visit: Payer: Self-pay | Attending: Internal Medicine

## 2019-10-23 ENCOUNTER — Other Ambulatory Visit: Payer: Self-pay

## 2019-10-23 MED FILL — metFORMIN HCL 500 MG TABS: 500 | 30 days supply | Qty: 15 | Fill #1

## 2019-10-26 ENCOUNTER — Ambulatory Visit
Admission: RE | Admit: 2019-10-26 | Discharge: 2019-10-26 | Disposition: A | Discharge status: Home / Self Care | Payer: No Typology Code available for payment source | Source: Ambulatory Visit | Attending: Obstetrics and Gynecology | Admitting: Obstetrics and Gynecology

## 2019-10-26 ENCOUNTER — Other Ambulatory Visit: Payer: Self-pay

## 2019-10-26 DIAGNOSIS — Z1231 Encounter for screening mammogram for malignant neoplasm of breast: Secondary | ICD-10-CM

## 2019-10-30 ENCOUNTER — Other Ambulatory Visit (HOSPITAL_COMMUNITY)
Admission: RE | Admit: 2019-10-30 | Discharge: 2019-10-30 | Disposition: A | Discharge status: Home / Self Care | Payer: HRSA Program | Source: Ambulatory Visit | Attending: Internal Medicine | Admitting: Internal Medicine

## 2019-10-30 DIAGNOSIS — Z01812 Encounter for preprocedural laboratory examination: Secondary | ICD-10-CM | POA: Insufficient documentation

## 2019-10-30 DIAGNOSIS — Z20822 Contact with and (suspected) exposure to covid-19: Secondary | ICD-10-CM | POA: Diagnosis not present

## 2019-10-30 LAB — SARS CORONAVIRUS 2 (TAT 6-24 HRS): SARS Coronavirus 2: NEGATIVE

## 2019-11-02 ENCOUNTER — Ambulatory Visit (HOSPITAL_BASED_OUTPATIENT_CLINIC_OR_DEPARTMENT_OTHER): Payer: No Typology Code available for payment source | Attending: Internal Medicine | Admitting: Internal Medicine

## 2019-11-09 ENCOUNTER — Other Ambulatory Visit: Payer: Self-pay

## 2019-11-09 ENCOUNTER — Ambulatory Visit: Payer: Self-pay | Attending: Internal Medicine | Admitting: Internal Medicine

## 2019-11-09 VITALS — BP 122/73 | HR 61 | Ht <= 58 in | Wt 272.0 lb

## 2019-11-09 DIAGNOSIS — Z1211 Encounter for screening for malignant neoplasm of colon: Secondary | ICD-10-CM

## 2019-11-09 DIAGNOSIS — R0683 Snoring: Secondary | ICD-10-CM

## 2019-11-09 DIAGNOSIS — R7303 Prediabetes: Secondary | ICD-10-CM

## 2019-11-09 DIAGNOSIS — R109 Unspecified abdominal pain: Secondary | ICD-10-CM

## 2019-11-09 LAB — GLUCOSE, POCT (MANUAL RESULT ENTRY): POC Glucose: 97 mg/dL (ref 70–99)

## 2019-11-09 MED ORDER — METFORMIN HCL 500 MG PO TABS: 250.0000 mg | ORAL_TABLET | Freq: Every day | ORAL | 6 refills | Status: DC

## 2019-11-09 MED FILL — metFORMIN HCL 500 MG TABS: 500 | 30 days supply | Qty: 15 | Fill #2

## 2019-11-09 NOTE — Progress Notes (Signed)
Follow up for hernia

## 2019-11-09 NOTE — Progress Notes (Signed)
Patient ID: Veronica Castaneda, female    DOB: 10-18-1960  MRN: [id number]  CC: Hernia   Subjective: Veronica Castaneda is a 59 y.o. female who presents for eval of periumbilical hernia Her concerns today include:  Pt with morbid obesity, OA LT knee, likely OSA/OHS, prediabetes, mixed HL.  Pt concern about having an umbilical hernia x 1 yr.  Feels a firmness above the umbilicus This area hurts when she does heavy lifting and "when its cold." Hurts a little with meals Moving bowels okay  Loud snoring:  Referred for sleep study on last visit.  Pt states she was called and told she needed to have COVID test first.  She had negative COVID test but did not receive a subsequent call to schedule the date and time for the sleep study  Colon cancer screening: Patient had turned in fit test but it was unable to be read due to age of specimen.  She is agreeable to getting another kit today.  Prediabetes: Requests refill on Metformin  Patient Active Problem List   Diagnosis Date Noted  . Screening breast examination 10/22/2019  . Prediabetes 08/18/2019  . Hyperlipidemia 08/18/2019  . Morbid obesity ([place]) 08/17/2019     Current Outpatient Medications on File Prior to Visit  Medication Sig Dispense Refill  . furosemide (LASIX) 20 MG tablet Take 1 tablet (20 mg total) by mouth daily as needed. 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 Do not take more than 1 tablet per day 30 tablet 1   No current facility-administered medications on file prior to visit.    No Known Allergies  Social History   Socioeconomic History  . Marital status: Single    Spouse name: Not on file  . Number of children: 3  . Years of education: Not on file  . Highest education level: 2nd grade  Occupational History  . Occupation: house wife  Tobacco Use  . Smoking status: Never Smoker  . Smokeless tobacco: Never Used  Substance and Sexual Activity  . Alcohol use: Never  . Drug use: Never  .  Sexual activity: Not Currently  Other Topics Concern  . Not on file  Social History Narrative  . Not on file   Social Determinants of Health   Financial Resource Strain:   . Difficulty of Paying Living Expenses: Not on file  Food Insecurity:   . Worried About Charity fundraiser in the Last Year: Not on file  . Ran Out of Food in the Last Year: Not on file  Transportation Needs: Unmet Transportation Needs  . Lack of Transportation (Medical): Yes  . Lack of Transportation (Non-Medical): Yes  Physical Activity:   . Days of Exercise per Week: Not on file  . Minutes of Exercise per Session: Not on file  Stress:   . Feeling of Stress : Not on file  Social Connections:   . Frequency of Communication with Friends and Family: Not on file  . Frequency of Social Gatherings with Friends and Family: Not on file  . Attends Religious Services: Not on file  . Active Member of Clubs or Organizations: Not on file  . Attends Archivist Meetings: Not on file  . Marital Status: Not on file  Intimate Partner Violence:   . Fear of Current or Ex-Partner: Not on file  . Emotionally Abused: Not on file  . Physically Abused: Not on file  . Sexually Abused: Not on file    Family History  Problem Relation Age of Onset  . Heart attack Mother   . Heart disease Mother     Past Surgical History:  Procedure Laterality Date  . No PAST SURGICAL HISTORY      ROS: Review of Systems Negative except as stated above  PHYSICAL EXAM: BP 122/73   Pulse 61   Ht _0  (1.448 m)   Wt 272 lb (123.4 kg)   SpO2 93%   BMI 58.86 kg/m   Physical Exam  General appearance - alert, well appearing, and in no distress Mental status - normal mood, behavior, speech, dress, motor activity, and thought processes Abdomen -obese, normal bowel sounds, soft, nontender and no weakness felt in the midline abdominal wall.  No bulging in the midline abdominal wall with increased pressure maneuvers.  No umbilical  hernia noted or felt  CMP Latest Ref Rng & Units 08/17/2019 01/30/2019 12/13/2018  Glucose 65 - 99 mg/dL 81 78 119(H)  BUN 6 - 24 mg/dL _1 Creatinine 0.57 - 1.00 mg/dL 0.52(L) 0.70 0.70  Sodium 134 - 144 mmol/L 141 142 143  Potassium 3.5 - 5.2 mmol/L 4.3 5.1 3.6  Chloride 96 - 106 mmol/L 101 100 106  CO2 20 - 29 mmol/L _2 Calcium 8.7 - 10.2 mg/dL 9.0 9.6 9.0  Total Protein 6.0 - 8.5 g/dL 7.1 - -  Total Bilirubin 0.0 - 1.2 mg/dL 0.3 - -  Alkaline Phos 39 - 117 IU/L 114 - -  AST 0 - 40 IU/L 15 - -  ALT 0 - 32 IU/L 11 - -   Lipid Panel     Component Value Date/Time   CHOL 278 (H) 08/17/2019 1144   TRIG 322 (H) 08/17/2019 1144   HDL 46 08/17/2019 1144   CHOLHDL 6.0 (H) 08/17/2019 1144   LDLCALC 170 (H) 08/17/2019 1144    CBC    Component Value Date/Time   WBC 7.3 08/17/2019 1144   WBC 7.4 12/11/2018 0723   RBC 4.64 08/17/2019 1144   RBC 4.66 12/11/2018 0723   HGB 14.3 08/17/2019 1144   HCT 43.7 08/17/2019 1144   PLT 220 08/17/2019 1144   MCV 94 08/17/2019 1144   MCH 30.8 08/17/2019 1144   MCH 28.5 12/11/2018 0723   MCHC 32.7 08/17/2019 1144   MCHC 28.9 (L) 12/11/2018 0723   RDW 11.9 08/17/2019 1144   LYMPHSABS 1.6 12/03/2018 0316   MONOABS 1.0 12/03/2018 0316   EOSABS 0.1 12/03/2018 0316   BASOSABS 0.0 12/03/2018 0316    ASSESSMENT AND PLAN: 1. Abdominal pain, unspecified abdominal location -Reassurance given.  I did not appreciate a ventral or umbilical hernia on exam.  2. Prediabetes - POCT glucose (manual entry) - Microalbumin / creatinine urine ratio - metFORMIN (GLUCOPHAGE) 500 MG tablet; Take 0.5 tablets (250 mg total) by mouth daily with breakfast.  Dispense: 30 tablet; Refill: 6  3. Screening for colon cancer - Fecal occult blood, imunochemical([facility])  4. Loud snoring We will send message to our referral coordinator to let her know that patient has not been scheduled for sleep study even though she had the Covid test.     Patient was given the opportunity to ask questions.  Patient verbalized understanding of the plan and was able to repeat key elements of the plan.  [employer] interpreter used during this encounter. #[id number]   Orders Placed This Encounter  Procedures  . Fecal occult blood, imunochemical([facility])  . Microalbumin / creatinine urine ratio  . POCT glucose (manual  entry)     Requested Prescriptions   Signed Prescriptions Disp Refills  . metFORMIN (GLUCOPHAGE) 500 MG tablet 30 tablet 6    Sig: Take 0.5 tablets (250 mg total) by mouth daily with breakfast.    Return in about 3 months (around 02/06/2020).  Karle Plumber, MD, FACP

## 2019-11-10 LAB — MICROALBUMIN / CREATININE URINE RATIO
Creatinine, Urine: 29.7 mg/dL
Microalb/Creat Ratio: 21 mg/g creat (ref 0–29)
Microalbumin, Urine: 6.2 ug/mL

## 2019-11-18 LAB — FECAL OCCULT BLOOD, IMMUNOCHEMICAL: Fecal Occult Bld: NEGATIVE

## 2019-11-24 ENCOUNTER — Telehealth: Payer: Self-pay

## 2019-11-24 NOTE — Telephone Encounter (Signed)
[employer] Byrd Hesselbach  Id# [id number]  contacted pt to go over Fit Test results pt didn't answer left a detailed vm informing pt of results

## 2019-12-23 MED FILL — ?METFORMIN HCL 500MG TABLET: 500 | 30 days supply | Qty: 15 | Fill #3

## 2020-01-25 MED FILL — METFORMIN HCL 500 MG TABS: 500 | 30 days supply | Qty: 15 | Fill #4

## 2020-02-11 ENCOUNTER — Encounter: Payer: Self-pay | Admitting: Internal Medicine

## 2020-02-11 ENCOUNTER — Other Ambulatory Visit: Payer: Self-pay

## 2020-02-11 ENCOUNTER — Ambulatory Visit: Payer: Self-pay | Attending: Internal Medicine | Admitting: Internal Medicine

## 2020-02-11 VITALS — BP 116/72 | HR 62 | Temp 96.6°F | Resp 16 | Wt 277.2 lb

## 2020-02-11 DIAGNOSIS — R7303 Prediabetes: Secondary | ICD-10-CM

## 2020-02-11 DIAGNOSIS — R400 Somnolence: Secondary | ICD-10-CM | POA: Insufficient documentation

## 2020-02-11 DIAGNOSIS — R600 Localized edema: Secondary | ICD-10-CM

## 2020-02-11 DIAGNOSIS — R0683 Snoring: Secondary | ICD-10-CM

## 2020-02-11 LAB — POCT GLYCOSYLATED HEMOGLOBIN (HGB A1C): HbA1c, POC (prediabetic range): 5.8 % (ref 5.7–6.4)

## 2020-02-11 LAB — GLUCOSE, POCT (MANUAL RESULT ENTRY): POC Glucose: 84 mg/dl (ref 70–99)

## 2020-02-11 MED ORDER — FUROSEMIDE 20 MG PO TABS: 20.0000 mg | ORAL_TABLET | Freq: Every day | ORAL | 2 refills | Status: DC | PRN

## 2020-02-11 MED FILL — ?FUROSEMIDE 20MG TABLET: 20 | 30 days supply | Qty: 30 | Fill #0

## 2020-02-11 NOTE — Progress Notes (Signed)
Patient ID: Veronica Castaneda, female    DOB: 1961-09-06  MRN: [id number]  CC: Diabetes (prediabetes)   Subjective: Veronica Castaneda is a 59 y.o. female who presents for chronic disease management Her concerns today include:  Pt withmorbidobesity, OA LT knee, likely OSA/OHS, prediabetes, mixed HL.   We had referred patient for sleep study on previous visit.  She had to have COVID-19 testing prior to the appointment which she did.  However appointment was canceled because patient was supposed to stay in quarantine for 2 to 3 days after having the negative test before her sleep study.  She broke the quarantine and therefore the sleep study was canceled.   Endorses daytime sleepiness and loud snoring still.  She is agreeable to being rescheduled.  She does not drive.  Prediabetes: Reports compliance with Metformin.  She tries to avoid junk foods.  Complains of swelling in feet which is chronic.  Tries to limit salt in the foods. Patient Active Problem List   Diagnosis Date Noted  . Screening breast examination 10/22/2019  . Prediabetes 08/18/2019  . Hyperlipidemia 08/18/2019  . Morbid obesity ([facility]) 08/17/2019     Current Outpatient Medications on File Prior to Visit  Medication Sig Dispense Refill  . furosemide (LASIX) 20 MG tablet Take 1 tablet (20 mg total) by mouth daily as needed. 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 Do not take more than 1 tablet per day 30 tablet 1  . metFORMIN (GLUCOPHAGE) 500 MG tablet Take 0.5 tablets (250 mg total) by mouth daily with breakfast. 30 tablet 6   No current facility-administered medications on file prior to visit.    No Known Allergies  Social History   Socioeconomic History  . Marital status: Single    Spouse name: Not on file  . Number of children: 3  . Years of education: Not on file  . Highest education level: 2nd grade  Occupational History  . Occupation: house wife  Tobacco Use  . Smoking status:  Never Smoker  . Smokeless tobacco: Never Used  Substance and Sexual Activity  . Alcohol use: Never  . Drug use: Never  . Sexual activity: Not Currently  Other Topics Concern  . Not on file  Social History Narrative  . Not on file   Social Determinants of Health   Financial Resource Strain:   . Difficulty of Paying Living Expenses:   Food Insecurity:   . Worried About Programme researcher, broadcasting/film/video in the Last Year:   . Barista in the Last Year:   Transportation Needs: Unmet Transportation Needs  . Lack of Transportation (Medical): Yes  . Lack of Transportation (Non-Medical): Yes  Physical Activity:   . Days of Exercise per Week:   . Minutes of Exercise per Session:   Stress:   . Feeling of Stress :   Social Connections:   . Frequency of Communication with Friends and Family:   . Frequency of Social Gatherings with Friends and Family:   . Attends Religious Services:   . Active Member of Clubs or Organizations:   . Attends Banker Meetings:   Marland Kitchen Marital Status:   Intimate Partner Violence:   . Fear of Current or Ex-Partner:   . Emotionally Abused:   Marland Kitchen Physically Abused:   . Sexually Abused:     Family History  Problem Relation Age of Onset  . Heart attack Mother   . Heart disease Mother     Past  Surgical History:  Procedure Laterality Date  . No PAST SURGICAL HISTORY      ROS: Review of Systems Negative except as stated above  PHYSICAL EXAM: BP 116/72   Pulse 62   Temp (!) 96.6 F (35.9 C)   Resp 16   Wt 277 lb 3.2 oz (125.7 kg)   SpO2 91%   BMI 59.99 kg/m   Physical Exam  General appearance - alert, well appearing, morbidly obese female and in no distress Mental status -patient has a hard time staying awake and falls asleep easily during conversation Chest - clear to auscultation, no wheezes, rales or rhonchi, symmetric air entry Heart - normal rate, regular rhythm, normal S1, S2, no murmurs, rubs, clicks or gallops Extremities -trace to  1+ edema of the lower one third of both legs and 1+ edema of the dorsal surface of the feet CMP Latest Ref Rng & Units 08/17/2019 01/30/2019 12/13/2018  Glucose 65 - 99 mg/dL 81 78 119(H)  BUN 6 - 24 mg/dL 13 11 17   Creatinine 0.57 - 1.00 mg/dL 0.52(L) 0.70 0.70  Sodium 134 - 144 mmol/L 141 142 143  Potassium 3.5 - 5.2 mmol/L 4.3 5.1 3.6  Chloride 96 - 106 mmol/L 101 100 106  CO2 20 - 29 mmol/L 28 27 28   Calcium 8.7 - 10.2 mg/dL 9.0 9.6 9.0  Total Protein 6.0 - 8.5 g/dL 7.1 - -  Total Bilirubin 0.0 - 1.2 mg/dL 0.3 - -  Alkaline Phos 39 - 117 IU/L 114 - -  AST 0 - 40 IU/L 15 - -  ALT 0 - 32 IU/L 11 - -   Lipid Panel     Component Value Date/Time   CHOL 278 (H) 08/17/2019 1144   TRIG 322 (H) 08/17/2019 1144   HDL 46 08/17/2019 1144   CHOLHDL 6.0 (H) 08/17/2019 1144   LDLCALC 170 (H) 08/17/2019 1144    CBC    Component Value Date/Time   WBC 7.3 08/17/2019 1144   WBC 7.4 12/11/2018 0723   RBC 4.64 08/17/2019 1144   RBC 4.66 12/11/2018 0723   HGB 14.3 08/17/2019 1144   HCT 43.7 08/17/2019 1144   PLT 220 08/17/2019 1144   MCV 94 08/17/2019 1144   MCH 30.8 08/17/2019 1144   MCH 28.5 12/11/2018 0723   MCHC 32.7 08/17/2019 1144   MCHC 28.9 (L) 12/11/2018 0723   RDW 11.9 08/17/2019 1144   LYMPHSABS 1.6 12/03/2018 0316   MONOABS 1.0 12/03/2018 0316   EOSABS 0.1 12/03/2018 0316   BASOSABS 0.0 12/03/2018 0316   Results for orders placed or performed in visit on 02/11/20  POCT glucose (manual entry)  Result Value Ref Range   POC Glucose 84 70 - 99 mg/dl  POCT glycosylated hemoglobin (Hb A1C)  Result Value Ref Range   Hemoglobin A1C     HbA1c POC (<> result, manual entry)     HbA1c, POC (prediabetic range) 5.8 5.7 - 6.4 %   HbA1c, POC (controlled diabetic range)      ASSESSMENT AND PLAN:  1. Prediabetes Continue Metformin.  Discussed on encourage healthy eating habits. - POCT glucose (manual entry) - POCT glycosylated hemoglobin (Hb A1C)  2. Loud snoring I suspect she  has severe sleep apnea and obesity hypoventilation syndrome.  We will try to get her in for sleep study as soon as possible - [facility] Sleep Study; Future  3. Daytime sleepiness - PSG Sleep Study; Future  4. Pedal edema - furosemide (LASIX) 20 MG tablet; Take  1 tablet (20 mg total) by mouth daily as needed. 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 Do not take more than 1 tablet per day  Dispense: 30 tablet; Refill: 2  5. Morbid obesity ([facility]) See #1 above     Patient was given the opportunity to ask questions.  Patient verbalized understanding of the plan and was able to repeat key elements of the plan.  [employer] interpreter used during this encounter. [id number]  Orders Placed This Encounter  Procedures  . POCT glucose (manual entry)  . POCT glycosylated hemoglobin (Hb A1C)     Requested Prescriptions    No prescriptions requested or ordered in this encounter    No follow-ups on file.  Jonah Blue, MD, FACP

## 2020-04-12 ENCOUNTER — Ambulatory Visit: Payer: Self-pay | Attending: Internal Medicine | Admitting: Internal Medicine

## 2020-04-12 ENCOUNTER — Other Ambulatory Visit: Payer: Self-pay

## 2020-04-12 DIAGNOSIS — R600 Localized edema: Secondary | ICD-10-CM

## 2020-04-12 DIAGNOSIS — R0683 Snoring: Secondary | ICD-10-CM

## 2020-04-12 NOTE — Progress Notes (Signed)
Virtual Visit via Telephone Note Due to current restrictions/limitations of in-office visits due to the COVID-19 pandemic, this scheduled clinical appointment was converted to a telehealth visit  I connected with Veronica Castaneda on 04/12/20 at 9:09 a.m EDT by telephone and verified that I am speaking with the correct person using two identifiers. I am in my office.  The patient is at home.  Only the patient, myself and [place] from [employer] [phone]) participated in this encounter.  I discussed the limitations, risks, security and privacy concerns of performing an evaluation and management service by telephone and the availability of in person appointments. I also discussed with the patient that there may be a patient responsible charge related to this service. The patient expressed understanding and agreed to proceed.   History of Present Illness: Pt withmorbidobesity, OA LT knee, likely OSA/OHS, prediabetes, mixed HL.  Today's visit is for chronic ds management.  Loud Snoring: I resubmitted referral for sleep study on last visit. Pt states she was not called for appt as yet.   LE edema: prescribed Furosemide on last visit. Pt reports that the med helps.  HM:  She did not get the COVID-19 vaccine.  She feels it is not necessary.  Observations/Objective: No direct observation done as this was a telephone encounter.  Assessment and Plan: 1. Loud snoring Message sent to our referral coordinator to inquire from the sleep lab whether appointment has been made as yet and to get back with the patient.  2. Pedal edema Continue furosemide as needed.   Follow Up Instructions: 4 mths   I discussed the assessment and treatment plan with the patient. The patient was provided an opportunity to ask questions and all were answered. The patient agreed with the plan and demonstrated an understanding of the instructions.   The patient was advised to call back or seek an  in-person evaluation if the symptoms worsen or if the condition fails to improve as anticipated.  I provided 10 minutes of non-face-to-face time during this encounter.   Jonah Blue, MD

## 2020-04-15 ENCOUNTER — Telehealth: Payer: Self-pay | Admitting: Internal Medicine

## 2020-04-15 NOTE — Telephone Encounter (Signed)
-----   Message from [name] sent at 04/15/2020 11:51 AM EDT ----- Regarding: Sleep study Good Morning  I called the sleep study they said that left several messages to call back  . They give me  an appointment for   05/17/2020 @ 8pm  I called patient lvm  and to call me back  to confirm the appointment .   ----- Message ----- From: Marcine Matar, MD Sent: 04/12/2020   9:21 AM EDT To: [name]  I had referred patient for sleep study earlier this year.  She had Covid testing but subsequently broke quarantine prior to the sleep study so the appointment was canceled by the sleep lab.  I saw her again in May of this year and resubmitted the referral for the sleep study.  So far patient states that she has not been called.  Can you please contact the sleep lab and see whether we can get her an appointment and then let her know.

## 2020-04-26 ENCOUNTER — Ambulatory Visit: Payer: Self-pay | Attending: Internal Medicine

## 2020-05-03 ENCOUNTER — Other Ambulatory Visit: Payer: Self-pay

## 2020-05-03 ENCOUNTER — Ambulatory Visit: Payer: No Typology Code available for payment source

## 2020-05-12 ENCOUNTER — Emergency Department (HOSPITAL_COMMUNITY): Payer: Self-pay

## 2020-05-12 ENCOUNTER — Inpatient Hospital Stay (HOSPITAL_COMMUNITY)
Admission: EM | Admit: 2020-05-12 | Discharge: 2020-05-24 | DRG: 207 | Disposition: A | Discharge status: Home Health | Payer: Self-pay | Attending: Internal Medicine | Admitting: Internal Medicine

## 2020-05-12 ENCOUNTER — Inpatient Hospital Stay (HOSPITAL_COMMUNITY): Payer: Self-pay

## 2020-05-12 DIAGNOSIS — Z20822 Contact with and (suspected) exposure to covid-19: Secondary | ICD-10-CM | POA: Diagnosis present

## 2020-05-12 DIAGNOSIS — J45901 Unspecified asthma with (acute) exacerbation: Secondary | ICD-10-CM | POA: Diagnosis present

## 2020-05-12 DIAGNOSIS — G4733 Obstructive sleep apnea (adult) (pediatric): Secondary | ICD-10-CM | POA: Diagnosis present

## 2020-05-12 DIAGNOSIS — E876 Hypokalemia: Secondary | ICD-10-CM | POA: Diagnosis present

## 2020-05-12 DIAGNOSIS — E873 Alkalosis: Secondary | ICD-10-CM | POA: Diagnosis present

## 2020-05-12 DIAGNOSIS — Z8249 Family history of ischemic heart disease and other diseases of the circulatory system: Secondary | ICD-10-CM

## 2020-05-12 DIAGNOSIS — I5033 Acute on chronic diastolic (congestive) heart failure: Secondary | ICD-10-CM | POA: Diagnosis present

## 2020-05-12 DIAGNOSIS — R110 Nausea: Secondary | ICD-10-CM | POA: Diagnosis present

## 2020-05-12 DIAGNOSIS — T884XXA Failed or difficult intubation, initial encounter: Secondary | ICD-10-CM | POA: Diagnosis present

## 2020-05-12 DIAGNOSIS — I445 Left posterior fascicular block: Secondary | ICD-10-CM | POA: Diagnosis present

## 2020-05-12 DIAGNOSIS — Z9911 Dependence on respirator [ventilator] status: Secondary | ICD-10-CM

## 2020-05-12 DIAGNOSIS — Z7984 Long term (current) use of oral hypoglycemic drugs: Secondary | ICD-10-CM

## 2020-05-12 DIAGNOSIS — B338 Other specified viral diseases: Secondary | ICD-10-CM | POA: Diagnosis present

## 2020-05-12 DIAGNOSIS — J121 Respiratory syncytial virus pneumonia: Secondary | ICD-10-CM | POA: Diagnosis present

## 2020-05-12 DIAGNOSIS — Z6841 Body Mass Index (BMI) 40.0 and over, adult: Secondary | ICD-10-CM

## 2020-05-12 DIAGNOSIS — T884XXD Failed or difficult intubation, subsequent encounter: Secondary | ICD-10-CM

## 2020-05-12 DIAGNOSIS — J9602 Acute respiratory failure with hypercapnia: Secondary | ICD-10-CM | POA: Diagnosis present

## 2020-05-12 DIAGNOSIS — Z9289 Personal history of other medical treatment: Secondary | ICD-10-CM

## 2020-05-12 DIAGNOSIS — I509 Heart failure, unspecified: Secondary | ICD-10-CM

## 2020-05-12 DIAGNOSIS — Z79899 Other long term (current) drug therapy: Secondary | ICD-10-CM

## 2020-05-12 DIAGNOSIS — E662 Morbid (severe) obesity with alveolar hypoventilation: Secondary | ICD-10-CM | POA: Diagnosis present

## 2020-05-12 DIAGNOSIS — R600 Localized edema: Secondary | ICD-10-CM

## 2020-05-12 DIAGNOSIS — E785 Hyperlipidemia, unspecified: Secondary | ICD-10-CM | POA: Diagnosis present

## 2020-05-12 DIAGNOSIS — R7303 Prediabetes: Secondary | ICD-10-CM | POA: Diagnosis present

## 2020-05-12 DIAGNOSIS — J9601 Acute respiratory failure with hypoxia: Principal | ICD-10-CM | POA: Diagnosis present

## 2020-05-12 HISTORY — DX: Dyspnea, unspecified: R06.00

## 2020-05-12 HISTORY — DX: Heart failure, unspecified: I50.9

## 2020-05-12 HISTORY — DX: Sleep apnea, unspecified: G47.30

## 2020-05-12 HISTORY — DX: Pneumonia, unspecified organism: J18.9

## 2020-05-12 HISTORY — DX: Unspecified asthma, uncomplicated: J45.909

## 2020-05-12 LAB — LACTIC ACID, PLASMA
Lactic Acid, Venous: 0.6 mmol/L (ref 0.5–1.9)
Lactic Acid, Venous: 2.9 mmol/L (ref 0.5–1.9)

## 2020-05-12 LAB — RESPIRATORY PANEL BY PCR

## 2020-05-12 LAB — URINALYSIS, COMPLETE (UACMP) WITH MICROSCOPIC
Bacteria, UA: NONE SEEN
Bilirubin Urine: NEGATIVE
Glucose, UA: NEGATIVE mg/dL
Hgb urine dipstick: NEGATIVE
Ketones, ur: NEGATIVE mg/dL
Leukocytes,Ua: NEGATIVE
Nitrite: NEGATIVE
Protein, ur: NEGATIVE mg/dL
Specific Gravity, Urine: 1.008 (ref 1.005–1.030)
pH: 5 (ref 5.0–8.0)

## 2020-05-12 LAB — POCT I-STAT 7, (LYTES, BLD GAS, ICA,H+H)
Acid-Base Excess: 13 mmol/L — ABNORMAL HIGH (ref 0.0–2.0)
Acid-Base Excess: 12 mmol/L — ABNORMAL HIGH (ref 0.0–2.0)
Bicarbonate: 39.7 mmol/L — ABNORMAL HIGH (ref 20.0–28.0)
Bicarbonate: 39.2 mmol/L — ABNORMAL HIGH (ref 20.0–28.0)
Calcium, Ion: 1.06 mmol/L — ABNORMAL LOW (ref 1.15–1.40)
Calcium, Ion: 1.08 mmol/L — ABNORMAL LOW (ref 1.15–1.40)
HCT: 44 % (ref 36.0–46.0)
HCT: 44 % (ref 36.0–46.0)
Hemoglobin: 15 g/dL (ref 12.0–15.0)
Hemoglobin: 15 g/dL (ref 12.0–15.0)
O2 Saturation: 96 %
O2 Saturation: 94 %
Patient temperature: 97.4
Patient temperature: 97.4
Potassium: 3.2 mmol/L — ABNORMAL LOW (ref 3.5–5.1)
Potassium: 3.4 mmol/L — ABNORMAL LOW (ref 3.5–5.1)
Sodium: 137 mmol/L (ref 135–145)
Sodium: 140 mmol/L (ref 135–145)
TCO2: 41 mmol/L — ABNORMAL HIGH (ref 22–32)
TCO2: 41 mmol/L — ABNORMAL HIGH (ref 22–32)
pCO2 arterial: 52.8 mmHg — ABNORMAL HIGH (ref 32.0–48.0)
pCO2 arterial: 57.3 mmHg — ABNORMAL HIGH (ref 32.0–48.0)
pH, Arterial: 7.482 — ABNORMAL HIGH (ref 7.350–7.450)
pH, Arterial: 7.44 (ref 7.350–7.450)
pO2, Arterial: 77 mmHg — ABNORMAL LOW (ref 83.0–108.0)
pO2, Arterial: 68 mmHg — ABNORMAL LOW (ref 83.0–108.0)

## 2020-05-12 LAB — CBC WITH DIFFERENTIAL/PLATELET
Abs Immature Granulocytes: 1.03 10*3/uL — ABNORMAL HIGH (ref 0.00–0.07)
Basophils Absolute: 0 10*3/uL (ref 0.0–0.1)
Basophils Relative: 0 %
Eosinophils Absolute: 0.1 10*3/uL (ref 0.0–0.5)
Eosinophils Relative: 0 %
HCT: 50.9 % — ABNORMAL HIGH (ref 36.0–46.0)
Hemoglobin: 14.1 g/dL (ref 12.0–15.0)
Immature Granulocytes: 9 %
Lymphocytes Relative: 9 %
Lymphs Abs: 1 10*3/uL (ref 0.7–4.0)
MCH: 27.6 pg (ref 26.0–34.0)
MCHC: 27.7 g/dL — ABNORMAL LOW (ref 30.0–36.0)
MCV: 99.6 fL (ref 80.0–100.0)
Monocytes Absolute: 1 10*3/uL (ref 0.1–1.0)
Monocytes Relative: 9 %
Neutro Abs: 8.2 10*3/uL — ABNORMAL HIGH (ref 1.7–7.7)
Neutrophils Relative %: 73 %
Platelets: 186 10*3/uL (ref 150–400)
RBC: 5.11 MIL/uL (ref 3.87–5.11)
RDW: 14.6 % (ref 11.5–15.5)
WBC Morphology: INCREASED
WBC: 11.3 10*3/uL — ABNORMAL HIGH (ref 4.0–10.5)
nRBC: 1.9 % — ABNORMAL HIGH (ref 0.0–0.2)

## 2020-05-12 LAB — COMPREHENSIVE METABOLIC PANEL
ALT: 24 U/L (ref 0–44)
AST: 32 U/L (ref 15–41)
Albumin: 3.1 g/dL — ABNORMAL LOW (ref 3.5–5.0)
Alkaline Phosphatase: 122 U/L (ref 38–126)
Anion gap: 5 (ref 5–15)
BUN: 7 mg/dL (ref 6–20)
CO2: 37 mmol/L — ABNORMAL HIGH (ref 22–32)
Calcium: 8.4 mg/dL — ABNORMAL LOW (ref 8.9–10.3)
Chloride: 95 mmol/L — ABNORMAL LOW (ref 98–111)
Creatinine, Ser: 0.54 mg/dL (ref 0.44–1.00)
GFR calc Af Amer: >60 mL/min (ref >60)
GFR calc non Af Amer: >60 mL/min (ref >60)
Glucose, Bld: 152 mg/dL — ABNORMAL HIGH (ref 70–99)
Potassium: 4.5 mmol/L (ref 3.5–5.1)
Sodium: 137 mmol/L (ref 135–145)
Total Bilirubin: 0.7 mg/dL (ref 0.3–1.2)
Total Protein: 7.5 g/dL (ref 6.5–8.1)

## 2020-05-12 LAB — STREP PNEUMONIAE URINARY ANTIGEN: Strep Pneumo Urinary Antigen: NEGATIVE

## 2020-05-12 LAB — I-STAT BETA HCG BLOOD, ED (MC, WL, AP ONLY): I-stat hCG, quantitative: <5 m[IU]/mL (ref <5)

## 2020-05-12 LAB — RAPID URINE DRUG SCREEN, HOSP PERFORMED
Amphetamines: NOT DETECTED
Barbiturates: NOT DETECTED
Benzodiazepines: POSITIVE — AB
Cocaine: NOT DETECTED
Opiates: NOT DETECTED
Tetrahydrocannabinol: NOT DETECTED

## 2020-05-12 LAB — BLOOD GAS, ARTERIAL
Acid-Base Excess: 11.8 mmol/L — ABNORMAL HIGH (ref 0.0–2.0)
Bicarbonate: 39.9 mmol/L — ABNORMAL HIGH (ref 20.0–28.0)
Drawn by: 535271
FIO2: 100
O2 Saturation: 99.2 %
Patient temperature: 36.4
pCO2 arterial: 102 mmHg (ref 32.0–48.0)
pH, Arterial: 7.213 — ABNORMAL LOW (ref 7.350–7.450)
pO2, Arterial: 229 mmHg — ABNORMAL HIGH (ref 83.0–108.0)

## 2020-05-12 LAB — HEMOGLOBIN A1C
Hgb A1c MFr Bld: 6.4 % — ABNORMAL HIGH (ref 4.8–5.6)
Mean Plasma Glucose: 136.98 mg/dL

## 2020-05-12 LAB — PROTIME-INR
INR: 1 (ref 0.8–1.2)
Prothrombin Time: 12.7 seconds (ref 11.4–15.2)

## 2020-05-12 LAB — PROCALCITONIN: Procalcitonin: <0.1 ng/mL

## 2020-05-12 LAB — TRIGLYCERIDES: Triglycerides: 96 mg/dL (ref <150)

## 2020-05-12 LAB — GLUCOSE, CAPILLARY
Glucose-Capillary: 155 mg/dL — ABNORMAL HIGH (ref 70–99)
Glucose-Capillary: 200 mg/dL — ABNORMAL HIGH (ref 70–99)

## 2020-05-12 LAB — HIV ANTIBODY (ROUTINE TESTING W REFLEX): HIV Screen 4th Generation wRfx: NONREACTIVE

## 2020-05-12 LAB — BRAIN NATRIURETIC PEPTIDE: B Natriuretic Peptide: 111.8 pg/mL — ABNORMAL HIGH (ref 0.0–100.0)

## 2020-05-12 LAB — TROPONIN I (HIGH SENSITIVITY)
Troponin I (High Sensitivity): 6 ng/L (ref <18)
Troponin I (High Sensitivity): 8 ng/L (ref <18)

## 2020-05-12 LAB — CBG MONITORING, ED: Glucose-Capillary: 153 mg/dL — ABNORMAL HIGH (ref 70–99)

## 2020-05-12 LAB — SARS CORONAVIRUS 2 BY RT PCR (HOSPITAL ORDER, PERFORMED IN ~~LOC~~ HOSPITAL LAB): SARS Coronavirus 2: NEGATIVE

## 2020-05-12 MED ORDER — NOREPINEPHRINE 4 MG/250ML-% IV SOLN
5.0000 ug/min | INTRAVENOUS | Status: DC
  Administered 2020-05-12 (×2): 10 ug/min via INTRAVENOUS
  Filled 2020-05-12 (×3): qty 250

## 2020-05-12 MED ORDER — ENOXAPARIN SODIUM 60 MG/0.6ML ~~LOC~~ SOLN
60.0000 mg | Freq: Every day | SUBCUTANEOUS | Status: DC
  Administered 2020-05-13 – 2020-05-24 (×12): 60 mg via SUBCUTANEOUS
  Filled 2020-05-12 (×12): qty 0.6

## 2020-05-12 MED ORDER — SODIUM CHLORIDE 0.9 % IV SOLN
1.0000 mg/kg/h | INTRAVENOUS | Status: DC
  Administered 2020-05-12 – 2020-05-14 (×11): 1 mg/kg/h via INTRAVENOUS
  Filled 2020-05-12 (×18): qty 5

## 2020-05-12 MED ORDER — ROCURONIUM BROMIDE 50 MG/5ML IV SOLN
80.0000 mg | Freq: Once | INTRAVENOUS | Status: AC
  Administered 2020-05-12: 80 mg via INTRAVENOUS

## 2020-05-12 MED ORDER — VECURONIUM BROMIDE 10 MG IV SOLR
10.0000 mg | INTRAVENOUS | Status: DC | PRN
  Administered 2020-05-12: 10 mg via INTRAVENOUS
  Filled 2020-05-12: qty 10

## 2020-05-12 MED ORDER — DOCUSATE SODIUM 100 MG PO CAPS: 100.0000 mg | ORAL_CAPSULE | Freq: Two times a day (BID) | ORAL | Status: DC | PRN

## 2020-05-12 MED ORDER — ALBUTEROL (5 MG/ML) CONTINUOUS INHALATION SOLN
10.0000 mg/h | INHALATION_SOLUTION | RESPIRATORY_TRACT | Status: AC
  Administered 2020-05-12: 10 mg/h via RESPIRATORY_TRACT
  Filled 2020-05-12: qty 20

## 2020-05-12 MED ORDER — SODIUM CHLORIDE 0.9 % IV SOLN
2.0000 g | INTRAVENOUS | Status: DC
  Administered 2020-05-12 – 2020-05-14 (×3): 2 g via INTRAVENOUS
  Filled 2020-05-12 (×2): qty 2
  Filled 2020-05-12: qty 20

## 2020-05-12 MED ORDER — MAGNESIUM SULFATE 2 GM/50ML IV SOLN
2.0000 g | Freq: Once | INTRAVENOUS | Status: AC
  Administered 2020-05-12: 2 g via INTRAVENOUS
  Filled 2020-05-12: qty 50

## 2020-05-12 MED ORDER — FUROSEMIDE 10 MG/ML IJ SOLN
60.0000 mg | Freq: Once | INTRAMUSCULAR | Status: AC
  Administered 2020-05-12: 60 mg via INTRAVENOUS
  Filled 2020-05-12: qty 6

## 2020-05-12 MED ORDER — CHLORHEXIDINE GLUCONATE CLOTH 2 % EX PADS
6.0000 | MEDICATED_PAD | Freq: Every day | CUTANEOUS | Status: DC
  Administered 2020-05-13 – 2020-05-22 (×6): 6 via TOPICAL

## 2020-05-12 MED ORDER — MIDAZOLAM 50MG/50ML (1MG/ML) PREMIX INFUSION
0.5000 mg/h | INTRAVENOUS | Status: DC
  Administered 2020-05-12: 5 mg/h via INTRAVENOUS
  Administered 2020-05-12: 0.5 mg/h via INTRAVENOUS
  Filled 2020-05-12: qty 50

## 2020-05-12 MED ORDER — FUROSEMIDE 10 MG/ML IJ SOLN
40.0000 mg | Freq: Four times a day (QID) | INTRAMUSCULAR | Status: AC
  Administered 2020-05-12: 40 mg via INTRAVENOUS
  Filled 2020-05-12: qty 4

## 2020-05-12 MED ORDER — ALBUTEROL SULFATE (2.5 MG/3ML) 0.083% IN NEBU
2.5000 mg | INHALATION_SOLUTION | RESPIRATORY_TRACT | Status: DC | PRN
  Administered 2020-05-12 – 2020-05-19 (×3): 2.5 mg via RESPIRATORY_TRACT
  Filled 2020-05-12 (×3): qty 3

## 2020-05-12 MED ORDER — SUCCINYLCHOLINE CHLORIDE 20 MG/ML IJ SOLN
150.0000 mg | Freq: Once | INTRAMUSCULAR | Status: AC
  Administered 2020-05-12: 150 mg via INTRAVENOUS
  Filled 2020-05-12: qty 7.5

## 2020-05-12 MED ORDER — NOREPINEPHRINE 4 MG/250ML-% IV SOLN
0.0000 ug/min | INTRAVENOUS | Status: DC
  Administered 2020-05-12: 10 ug/min via INTRAVENOUS
  Administered 2020-05-13: 4 ug/min via INTRAVENOUS
  Filled 2020-05-12: qty 250

## 2020-05-12 MED ORDER — FAMOTIDINE IN NACL 20-0.9 MG/50ML-% IV SOLN
20.0000 mg | Freq: Two times a day (BID) | INTRAVENOUS | Status: DC
  Administered 2020-05-12 – 2020-05-15 (×8): 20 mg via INTRAVENOUS
  Filled 2020-05-12 (×8): qty 50

## 2020-05-12 MED ORDER — ONDANSETRON HCL 4 MG/2ML IJ SOLN
4.0000 mg | Freq: Four times a day (QID) | INTRAMUSCULAR | Status: DC | PRN
  Administered 2020-05-22: 4 mg via INTRAVENOUS
  Filled 2020-05-12 (×2): qty 2

## 2020-05-12 MED ORDER — SODIUM CHLORIDE 0.9 % IV SOLN: INTRAVENOUS | Status: DC | PRN

## 2020-05-12 MED ORDER — VECURONIUM BROMIDE 10 MG IV SOLR
10.0000 mg | Freq: Once | INTRAVENOUS | Status: AC
  Administered 2020-05-12: 10 mg via INTRAVENOUS
  Filled 2020-05-12: qty 10

## 2020-05-12 MED ORDER — METHYLPREDNISOLONE SODIUM SUCC 125 MG IJ SOLR
60.0000 mg | Freq: Three times a day (TID) | INTRAMUSCULAR | Status: DC
  Administered 2020-05-12 – 2020-05-15 (×10): 60 mg via INTRAVENOUS
  Filled 2020-05-12 (×10): qty 2

## 2020-05-12 MED ORDER — SODIUM CHLORIDE 0.9 % IV SOLN
500.0000 mg | INTRAVENOUS | Status: AC
  Administered 2020-05-12 – 2020-05-14 (×3): 500 mg via INTRAVENOUS
  Filled 2020-05-12 (×4): qty 500

## 2020-05-12 MED ORDER — CHLORHEXIDINE GLUCONATE 0.12% ORAL RINSE (MEDLINE KIT)
15.0000 mL | Freq: Two times a day (BID) | OROMUCOSAL | Status: DC
  Administered 2020-05-12 – 2020-05-21 (×18): 15 mL via OROMUCOSAL

## 2020-05-12 MED ORDER — PROPOFOL 1000 MG/100ML IV EMUL
5.0000 ug/kg/min | INTRAVENOUS | Status: DC
  Administered 2020-05-12: 25 ug/kg/min via INTRAVENOUS
  Administered 2020-05-13 (×3): 20 ug/kg/min via INTRAVENOUS
  Administered 2020-05-13: 10 ug/kg/min via INTRAVENOUS
  Administered 2020-05-14: 35 ug/kg/min via INTRAVENOUS
  Administered 2020-05-14 (×2): 20 ug/kg/min via INTRAVENOUS
  Administered 2020-05-14: 35 ug/kg/min via INTRAVENOUS
  Administered 2020-05-15: 40 ug/kg/min via INTRAVENOUS
  Administered 2020-05-15 (×2): 35 ug/kg/min via INTRAVENOUS
  Administered 2020-05-15: 20 ug/kg/min via INTRAVENOUS
  Administered 2020-05-15: 35 ug/kg/min via INTRAVENOUS
  Administered 2020-05-16: 40 ug/kg/min via INTRAVENOUS
  Administered 2020-05-16 (×3): 30 ug/kg/min via INTRAVENOUS
  Administered 2020-05-16 – 2020-05-17 (×4): 35 ug/kg/min via INTRAVENOUS
  Filled 2020-05-12 (×10): qty 100
  Filled 2020-05-12 (×2): qty 200
  Filled 2020-05-12 (×6): qty 100
  Filled 2020-05-12: qty 200
  Filled 2020-05-12 (×4): qty 100

## 2020-05-12 MED ORDER — SODIUM CHLORIDE 0.9 % IV BOLUS
250.0000 mL | Freq: Once | INTRAVENOUS | Status: AC
  Administered 2020-05-12: 250 mL via INTRAVENOUS

## 2020-05-12 MED ORDER — SODIUM CHLORIDE 0.9 % IV SOLN
250.0000 mL | INTRAVENOUS | Status: DC
  Administered 2020-05-12 – 2020-05-15 (×2): 250 mL via INTRAVENOUS

## 2020-05-12 MED ORDER — ORAL CARE MOUTH RINSE
15.0000 mL | OROMUCOSAL | Status: DC
  Administered 2020-05-12 – 2020-05-21 (×85): 15 mL via OROMUCOSAL

## 2020-05-12 MED ORDER — ARTIFICIAL TEARS OPHTHALMIC OINT
1.0000 "application " | TOPICAL_OINTMENT | Freq: Three times a day (TID) | OPHTHALMIC | Status: DC
  Administered 2020-05-12 – 2020-05-16 (×11): 1 via OPHTHALMIC
  Filled 2020-05-12 (×2): qty 3.5

## 2020-05-12 MED ORDER — INSULIN ASPART 100 UNIT/ML ~~LOC~~ SOLN
0.0000 [IU] | SUBCUTANEOUS | Status: DC
  Administered 2020-05-12 (×2): 2 [IU] via SUBCUTANEOUS
  Administered 2020-05-13 (×2): 1 [IU] via SUBCUTANEOUS
  Administered 2020-05-13: 2 [IU] via SUBCUTANEOUS
  Administered 2020-05-13: 1 [IU] via SUBCUTANEOUS
  Administered 2020-05-13 – 2020-05-14 (×3): 2 [IU] via SUBCUTANEOUS
  Administered 2020-05-14: 1 [IU] via SUBCUTANEOUS
  Administered 2020-05-14: 2 [IU] via SUBCUTANEOUS
  Administered 2020-05-14 – 2020-05-15 (×7): 1 [IU] via SUBCUTANEOUS
  Administered 2020-05-16 (×2): 2 [IU] via SUBCUTANEOUS
  Administered 2020-05-16: 1 [IU] via SUBCUTANEOUS
  Administered 2020-05-17 (×2): 2 [IU] via SUBCUTANEOUS
  Administered 2020-05-18 (×4): 1 [IU] via SUBCUTANEOUS
  Administered 2020-05-18: 2 [IU] via SUBCUTANEOUS
  Administered 2020-05-18 (×2): 1 [IU] via SUBCUTANEOUS
  Administered 2020-05-19: 2 [IU] via SUBCUTANEOUS
  Administered 2020-05-19: 1 [IU] via SUBCUTANEOUS
  Administered 2020-05-19 – 2020-05-20 (×3): 2 [IU] via SUBCUTANEOUS
  Administered 2020-05-20 – 2020-05-21 (×2): 1 [IU] via SUBCUTANEOUS
  Administered 2020-05-21: 2 [IU] via SUBCUTANEOUS

## 2020-05-12 MED ORDER — BUDESONIDE 0.5 MG/2ML IN SUSP
0.5000 mg | Freq: Two times a day (BID) | RESPIRATORY_TRACT | Status: DC
  Administered 2020-05-12 – 2020-05-24 (×26): 0.5 mg via RESPIRATORY_TRACT
  Filled 2020-05-12 (×27): qty 2

## 2020-05-12 MED ORDER — REVEFENACIN 175 MCG/3ML IN SOLN
175.0000 ug | Freq: Every day | RESPIRATORY_TRACT | Status: DC
  Administered 2020-05-12 – 2020-05-24 (×12): 175 ug via RESPIRATORY_TRACT
  Filled 2020-05-12 (×13): qty 3

## 2020-05-12 MED ORDER — MIDAZOLAM HCL 2 MG/2ML IJ SOLN
5.0000 mg | Freq: Once | INTRAMUSCULAR | Status: AC
  Administered 2020-05-12: 5 mg via INTRAVENOUS
  Filled 2020-05-12: qty 6

## 2020-05-12 MED ORDER — FENTANYL 2500MCG IN NS 250ML (10MCG/ML) PREMIX INFUSION
0.0000 ug/h | INTRAVENOUS | Status: DC
  Administered 2020-05-12: 25 ug/h via INTRAVENOUS
  Administered 2020-05-12: 200 ug/h via INTRAVENOUS
  Administered 2020-05-13: 175 ug/h via INTRAVENOUS
  Administered 2020-05-13 – 2020-05-14 (×3): 200 ug/h via INTRAVENOUS
  Administered 2020-05-15 – 2020-05-16 (×2): 100 ug/h via INTRAVENOUS
  Administered 2020-05-17: 75 ug/h via INTRAVENOUS
  Filled 2020-05-12 (×8): qty 250

## 2020-05-12 MED ORDER — ENOXAPARIN SODIUM 40 MG/0.4ML ~~LOC~~ SOLN
40.0000 mg | Freq: Every day | SUBCUTANEOUS | Status: DC
  Administered 2020-05-12: 40 mg via SUBCUTANEOUS
  Filled 2020-05-12: qty 0.4

## 2020-05-12 MED ORDER — ETOMIDATE 2 MG/ML IV SOLN
30.0000 mg | Freq: Once | INTRAVENOUS | Status: AC
  Administered 2020-05-12: 30 mg via INTRAVENOUS

## 2020-05-12 MED ORDER — FENTANYL CITRATE (PF) 100 MCG/2ML IJ SOLN
100.0000 ug | Freq: Once | INTRAMUSCULAR | Status: AC
  Administered 2020-05-12: 100 ug via INTRAVENOUS
  Filled 2020-05-12: qty 2

## 2020-05-12 MED ORDER — POLYETHYLENE GLYCOL 3350 17 G PO PACK: 17.0000 g | PACK | Freq: Every day | ORAL | Status: DC | PRN

## 2020-05-12 MED ORDER — ARFORMOTEROL TARTRATE 15 MCG/2ML IN NEBU
15.0000 ug | INHALATION_SOLUTION | Freq: Two times a day (BID) | RESPIRATORY_TRACT | Status: DC
  Administered 2020-05-12 – 2020-05-24 (×25): 15 ug via RESPIRATORY_TRACT
  Filled 2020-05-12 (×28): qty 2

## 2020-05-12 NOTE — H&P (Signed)
NAME:  Veronica Castaneda, MRN:  [id number], DOB:  Jan 15, 1961, LOS: 0 ADMISSION DATE:  05/12/2020, CONSULTATION DATE:  05/12/20 REFERRING MD:  ER, CHIEF COMPLAINT:  SOB   Brief History   59 year old woman with hx of CHF presenting with 4 days worsening dyspnea arrived to ER in extremis and intubated.  History of present illness   59 year old woman with hx of CHF presenting with 4 days worsening dyspnea arrived to ER in extremis and intubated.  History per daughter at bedside with interpreter.  Had headaches, subjective fever, wheezing.  Has history of an unspecified lung disease that sounds like asthma that she gets frequent URIs with.  Was intubated last year with flu B and similar presentation.  PCCM asked to admit for acute hypoxemic and presumed hypercarbic resp failure.  Past Medical History  OHS OSA Class 3 obesity Asthma  Significant Hospital Events   8/19 admitted  Consults:  NA  Procedures:  NA  Significant Diagnostic Tests:  CXR- no infiltrates  Micro Data:  COVID neg  Antimicrobials:  Azithromycin 8/19>>   Interim history/subjective:  Admitted  Objective   Blood pressure (!) 81/53, pulse 80, temperature (!) 97.4 F (36.3 C), temperature source Oral, resp. rate 20, height 4\' 9"  (1.448 m), weight 125.7 kg, SpO2 98 %.    Vent Mode: PRVC FiO2 (%):  [100 %] 100 % Set Rate:  [20 bmp] 20 bmp Vt Set:  [400 mL] 400 mL PEEP:  [5 cmH20] 5 cmH20  No intake or output data in the 24 hours ending 05/12/20 0928 Filed Weights   05/12/20 0725  Weight: 125.7 kg    Examination: General: obese woman unresponsive on vent HENT: ETT in place copious tan secretions Lungs: wheezing and poor air movement Cardiovascular: RRR, ext warm Abdomen: Soft, hypoactive BS Extremities: 2+ edema Neuro: Sedated/paralyzed Skin: no rashes  Resolved Hospital Problem list   N/A  Assessment & Plan:  Multifactorial acute hypoxemic and hypercarbic respiratory failure- she has  baseline OHS/OSA not formally diagnosed and suspicion for asthma.  Trigger for bronchospasm unclear at this time, she is tough to ventilate.  Also hx of mild CHF, some extra fluid on board so reasonable to try diuresis. - Magnesium, Ketamine, Steroids, continuous neb, lasix - Will have to tinker with vent to minimize air trapping as able - She will need paralytic protocol - Viral resp panel, resp culture, Pct, legionella/strep ag, UDS  Best practice:  Diet: TF Pain/Anxiety/Delirium protocol (if indicated): Ketamine, fentanyl, propofol VAP protocol (if indicated): ordered DVT prophylaxis: Lovenox GI prophylaxis: Pepcid Glucose control: SSI Mobility: BR Code Status: full Family Communication: updated daughter at bedside Disposition: ICU  Labs   CBC: Recent Labs  Lab 05/12/20 0654  WBC 11.3*  NEUTROABS 8.2*  HGB 14.1  HCT 50.9*  MCV 99.6  PLT 186    Basic Metabolic Panel: Recent Labs  Lab 05/12/20 0654  NA 137  K 4.5  CL 95*  CO2 37*  GLUCOSE 152*  BUN 7  CREATININE 0.54  CALCIUM 8.4*   GFR: Estimated Creatinine Clearance: 91 mL/min (by C-G formula based on SCr of 0.54 mg/dL). Recent Labs  Lab 05/12/20 0654 05/12/20 0716  WBC 11.3*  --   LATICACIDVEN  --  0.6    Liver Function Tests: Recent Labs  Lab 05/12/20 0654  AST 32  ALT 24  ALKPHOS 122  BILITOT 0.7  PROT 7.5  ALBUMIN 3.1*   No results for input(s): LIPASE, AMYLASE in the  last 168 hours. No results for input(s): AMMONIA in the last 168 hours.  ABG    Component Value Date/Time   PHART 7.428 12/08/2018 0423   PCO2ART 59.2 (H) 12/08/2018 0423   PO2ART 67.0 (L) 12/08/2018 0423   HCO3 38.7 (H) 12/08/2018 0423   TCO2 40 (H) 12/08/2018 0423   O2SAT 92.0 12/08/2018 0423     Coagulation Profile: Recent Labs  Lab 05/12/20 0716  INR 1.0    Cardiac Enzymes: No results for input(s): CKTOTAL, CKMB, CKMBINDEX, TROPONINI in the last 168 hours.  HbA1C: HbA1c, POC (prediabetic range)    Date/Time Value Ref Range Status  02/11/2020 10:27 AM 5.8 5.7 - 6.4 % Final   Hgb A1c MFr Bld  Date/Time Value Ref Range Status  08/17/2019 11:44 AM 6.2 (H) 4.8 - 5.6 % Final    Comment:             Prediabetes: 5.7 - 6.4          Diabetes: >6.4          Glycemic control for adults with diabetes: <7.0   11/29/2018 03:52 AM 6.2 (H) 4.8 - 5.6 % Final    Comment:    (NOTE) Pre diabetes:          5.7%-6.4% Diabetes:              >6.4% Glycemic control for   <7.0% adults with diabetes     CBG: No results for input(s): GLUCAP in the last 168 hours.  Review of Systems:   Cannot, intubated/sedated  Past Medical History  She,  has a past medical history of Hyperlipidemia.   Surgical History    Past Surgical History:  Procedure Laterality Date  . No PAST SURGICAL HISTORY       Social History   reports that she has never smoked. She has never used smokeless tobacco. She reports that she does not drink alcohol and does not use drugs.   Family History   Her family history includes Heart attack in her mother; Heart disease in her mother.   Allergies No Known Allergies   Home Medications  Prior to Admission medications   Medication Sig Start Date End Date Taking? Authorizing Provider  furosemide (LASIX) 20 MG tablet Take 1 tablet (20 mg total) by mouth daily as needed. 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 Do not take more than 1 tablet per day 02/11/20 05/11/20  Marcine Matar, MD  metFORMIN (GLUCOPHAGE) 500 MG tablet Take 0.5 tablets (250 mg total) by mouth daily with breakfast. 11/09/19   Marcine Matar, MD     Critical care time: 43 minutes

## 2020-05-12 NOTE — Procedures (Addendum)
Central Venous Catheter Insertion Procedure Note Veronica Castaneda [id number] Oct 13, 1960  Procedure: Insertion of Central Venous Catheter Indications: Assessment of intravascular volume, Drug and/or fluid administration and Frequent blood sampling  Procedure Details Consent: Risks of procedure as well as the alternatives and risks of each were explained to the (patient/caregiver).  Consent for procedure obtained. Time Out: Verified patient identification, verified procedure, site/side was marked, verified correct patient position, special equipment/implants available, medications/allergies/relevent history reviewed, required imaging and test results available.  Performed  Maximum sterile technique was used including antiseptics, cap, gloves, gown, hand hygiene, mask and sheet. Skin prep: Chlorhexidine; local anesthetic administered A antimicrobial bonded/coated triple lumen catheter was placed in the left internal jugular vein using the Seldinger technique. Ultrasound guidance used.Yes.   Catheter placed to 20 cm. Blood aspirated via all 3 ports and then flushed x 3. Line sutured x 2 and dressing applied.  Evaluation Blood flow good Complications: No apparent complications Patient did tolerate procedure well. Chest X-ray ordered to verify placement.  CXR: pending.   l I j v Veronica Castaneda Veronica Castaneda ACNP Acute Care Nurse Practitioner [facility] Please consult Amion 05/12/2020, 10:37 AM

## 2020-05-12 NOTE — ED Provider Notes (Signed)
[facility] EMERGENCY DEPARTMENT Provider Note   CSN: [id number] Arrival date & time: 05/12/20  1937     History No chief complaint on file.   Veronica Castaneda [name] is a 59 y.o. female.  HPI  Patient presents to the ED from home for shortness of breath.  Patient with a history of obesity, OSA, CHF and recent respiratory failure requiring ICU and intubation.  Briefly, the patient complained of nausea, shortness of breath, and headaches over the last several days.  Additional HPI information is limited due to the patient's respiratory distress and lethargy on examination requiring mechanical ventilation.  Collateral obtained from the daughter at bedside briefly, reported that she has had progressive shortness of breath and dyspnea over the last 2 days.  No sick contacts.  Denies any sick contacts.  Has not received COVID-19 vaccination.  Denies any chest pain.      Past Medical History:  Diagnosis Date  . Hyperlipidemia     Patient Active Problem List   Diagnosis Date Noted  . Acute respiratory failure with hypoxia (HCC) 05/12/2020  . Loud snoring 02/11/2020  . Daytime sleepiness 02/11/2020  . Screening breast examination 10/22/2019  . Prediabetes 08/18/2019  . Hyperlipidemia 08/18/2019  . Morbid obesity (HCC) 08/17/2019    Past Surgical History:  Procedure Laterality Date  . No PAST SURGICAL HISTORY       OB History    Gravida  7   Para      Term      Preterm      AB      Living  4     SAB      TAB      Ectopic      Multiple      Live Births              Family History  Problem Relation Age of Onset  . Heart attack Mother   . Heart disease Mother     Social History   Tobacco Use  . Smoking status: Never Smoker  . Smokeless tobacco: Never Used  Vaping Use  . Vaping Use: Never used  Substance Use Topics  . Alcohol use: Never  . Drug use: Never    Home Medications Prior to Admission medications   Medication  Sig Start Date End Date Taking? Authorizing Provider  furosemide (LASIX) 20 MG tablet Take 1 tablet (20 mg total) by mouth daily as needed. 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 Do not take more than 1 tablet per day 02/11/20 05/11/20  Marcine Matar, MD  metFORMIN (GLUCOPHAGE) 500 MG tablet Take 0.5 tablets (250 mg total) by mouth daily with breakfast. 11/09/19   Marcine Matar, MD    Allergies    Patient has no known allergies.  Review of Systems   Review of Systems  Unable to perform ROS: Severe respiratory distress    Physical Exam Updated Vital Signs BP 121/79   Pulse 88   Temp (!) 97.4 F (36.3 C) (Oral)   Resp 18   Ht 4\' 9"  (1.448 m)   Wt 125.7 kg   SpO2 100%   BMI 59.97 kg/m   Physical Exam Vitals and nursing note reviewed.  Constitutional:      General: She is in acute distress.     Appearance: She is well-developed. She is obese. She is ill-appearing.  HENT:     Head: Normocephalic and atraumatic.  Eyes:     Extraocular Movements:  Extraocular movements intact.     Conjunctiva/sclera: Conjunctivae normal.     Pupils: Pupils are equal, round, and reactive to light.  Cardiovascular:     Rate and Rhythm: Normal rate and regular rhythm.     Pulses: Normal pulses.     Heart sounds: No murmur heard.   Pulmonary:     Effort: Tachypnea, respiratory distress and retractions present.     Breath sounds: Rhonchi present.  Abdominal:     General: There is no distension.     Palpations: Abdomen is soft.     Tenderness: There is no abdominal tenderness.  Musculoskeletal:     Cervical back: Normal range of motion and neck supple. No rigidity.     Right lower leg: Edema present.     Left lower leg: Edema present.  Skin:    General: Skin is warm and dry.  Neurological:     Mental Status: She is lethargic.     GCS: GCS eye subscore is 2. GCS verbal subscore is 4. GCS motor subscore is 5.     ED Results / Procedures / Treatments   Labs (all labs ordered are  listed, but only abnormal results are displayed) Labs Reviewed  COMPREHENSIVE METABOLIC PANEL - Abnormal; Notable for the following components:      Result Value   Chloride 95 (*)    CO2 37 (*)    Glucose, Bld 152 (*)    Calcium 8.4 (*)    Albumin 3.1 (*)    All other components within normal limits  CBC WITH DIFFERENTIAL/PLATELET - Abnormal; Notable for the following components:   WBC 11.3 (*)    HCT 50.9 (*)    MCHC 27.7 (*)    nRBC 1.9 (*)    Neutro Abs 8.2 (*)    Abs Immature Granulocytes 1.03 (*)    All other components within normal limits  BRAIN NATRIURETIC PEPTIDE - Abnormal; Notable for the following components:   B Natriuretic Peptide 111.8 (*)    All other components within normal limits  SARS CORONAVIRUS 2 BY RT PCR (HOSPITAL ORDER, PERFORMED IN [facility])  CULTURE, BLOOD (ROUTINE X 2)  CULTURE, BLOOD (ROUTINE X 2)  CULTURE, RESPIRATORY  RESPIRATORY PANEL BY PCR  LACTIC ACID, PLASMA  PROTIME-INR  LACTIC ACID, PLASMA  URINALYSIS, ROUTINE W REFLEX MICROSCOPIC  PROCALCITONIN  HIV ANTIBODY (ROUTINE TESTING W REFLEX)  STREP PNEUMONIAE URINARY ANTIGEN  LEGIONELLA PNEUMOPHILA SEROGP 1 UR AG  URINALYSIS, COMPLETE (UACMP) WITH MICROSCOPIC  HEMOGLOBIN A1C  RAPID URINE DRUG SCREEN, HOSP PERFORMED  BLOOD GAS, ARTERIAL  I-STAT BETA HCG BLOOD, ED ([facility], [facility], [facility] ONLY)  I-STAT VENOUS BLOOD GAS, ED  TROPONIN I (HIGH SENSITIVITY)  TROPONIN I (HIGH SENSITIVITY)    EKG EKG Interpretation  Date/Time:  Thursday May 12 2020 06:58:16 EDT Ventricular Rate:  97 PR Interval:  142 QRS Duration: 76 QT Interval:  342 QTC Calculation: 434 R Axis:   122 Text Interpretation: Sinus rhythm with occasional Premature ventricular complexes Right atrial enlargement Low voltage QRS Left posterior fascicular block Abnormal ECG no sig change from previous Confirmed by Arby Barrette [phone]) on 05/12/2020 7:24:52 AM   Radiology DG Chest Portable 1 View  Result Date:  05/12/2020 CLINICAL DATA:  ETT placement EXAM: PORTABLE CHEST 1 VIEW COMPARISON:  May 12, 2020 FINDINGS: Endotracheal tube has been placed since the prior study along with a gastric tube. Endotracheal tube tip is below the clavicular heads though projection is somewhat lordotic. The carina is  not well seen. The tip of the tube is likely in the vicinity of the carina perhaps approximately 5 mm from the carina. Cardiomediastinal contours remain enlarged. No dense consolidation.  Improved aeration at the lung bases. On limited assessment visualized skeletal structures without acute process. IMPRESSION: 1. Endotracheal tube tip is below the clavicular heads though projection is somewhat lordotic. The carina is not well seen. The tip of the tube is likely in the vicinity of the carina perhaps approximately 5 mm from the carina. Correlation with clinical parameters and repeat chest radiograph is suggested with retraction as needed based on follow-up film. 2. Improved aeration at the lung bases. These results were called by telephone at the time of interpretation on 05/12/2020 at 8:44 am to provider [facility] , who verbally acknowledged these results. Electronically Signed   By: Donzetta Kohut M.D.   On: 05/12/2020 08:44   DG Chest Port 1 View  Result Date: 05/12/2020 CLINICAL DATA:  Shortness of breath strap EXAM: PORTABLE CHEST 1 VIEW COMPARISON:  12/09/2018 FINDINGS: Cardiomegaly and vascular pedicle widening distorted by leftward rotation. Diffuse interstitial thickening with cephalized flow. Limited assessment of the retrocardiac lung due to overlying soft tissue density. No evidence of pneumothorax. IMPRESSION: CHF pattern. Electronically Signed   By: Marnee Spring M.D.   On: 05/12/2020 07:05    Procedures Procedure Name: Intubation Date/Time: 05/12/2020 8:17 AM Performed by: Nino Parsley, MD Pre-anesthesia Checklist: Patient identified, Emergency Drugs available, Suction available, Patient being  monitored and Timeout performed Oxygen Delivery Method: Ambu bag Preoxygenation: Pre-oxygenation with 100% oxygen Induction Type: Rapid sequence Ventilation: Mask ventilation without difficulty Laryngoscope Size: Glidescope and 4 Tube size: 7.5 mm Number of attempts: 1 Airway Equipment and Method: Rigid stylet and Video-laryngoscopy Placement Confirmation: ETT inserted through vocal cords under direct vision,  Positive ETCO2 and Breath sounds checked- equal and bilateral Secured at: 22 cm      (including critical care time)  Medications Ordered in ED Medications  midazolam (VERSED) 50 mg/50 mL (1 mg/mL) premix infusion (0.5 mg/hr Intravenous New Bag/Given 05/12/20 0926)  fentaNYL in NS (24mcg/ml) infusion-PREMIX (25 mcg/hr Intravenous New Bag/Given 05/12/20 0925)  methylPREDNISolone sodium succinate (SOLU-MEDROL) 125 mg/2 mL injection 60 mg (has no administration in time range)  albuterol (PROVENTIL,VENTOLIN) solution continuous neb (10 mg/hr Nebulization New Bag/Given 05/12/20 0939)  ketamine (KETALAR) 500 mg in sodium chloride 0.9 % 100 mL (5 mg/mL) infusion (has no administration in time range)  magnesium sulfate IVPB 2 g 50 mL (has no administration in time range)  budesonide (PULMICORT) nebulizer solution 0.5 mg (has no administration in time range)  arformoterol (BROVANA) nebulizer solution 15 mcg (has no administration in time range)  revefenacin (YUPELRI) nebulizer solution 175 mcg (has no administration in time range)  azithromycin (ZITHROMAX) 500 mg in sodium chloride 0.9 % 250 mL IVPB (has no administration in time range)  furosemide (LASIX) injection 40 mg (has no administration in time range)  cefTRIAXone (ROCEPHIN) 2 g in sodium chloride 0.9 % 100 mL IVPB (has no administration in time range)  docusate sodium (COLACE) capsule 100 mg (has no administration in time range)  polyethylene glycol (MIRALAX / GLYCOLAX) packet 17 g (has no administration in time range)   enoxaparin (LOVENOX) injection 40 mg (has no administration in time range)  famotidine (PEPCID) IVPB 20 mg premix (has no administration in time range)  ondansetron (ZOFRAN) injection 4 mg (has no administration in time range)  0.9 %  sodium chloride infusion (has no administration  in time range)  norepinephrine (LEVOPHED) 4mg  in premix infusion (has no administration in time range)  insulin aspart (novoLOG) injection 0-9 Units (has no administration in time range)  etomidate (AMIDATE) injection 30 mg (30 mg Intravenous Given 05/12/20 0804)  succinylcholine (ANECTINE) injection 150 mg (150 mg Intravenous Given 05/12/20 0804)  furosemide (LASIX) injection 60 mg (60 mg Intravenous Given 05/12/20 0836)  midazolam (VERSED) injection 5 mg (5 mg Intravenous Given 05/12/20 0834)  fentaNYL (SUBLIMAZE) injection 100 mcg (100 mcg Intravenous Given 05/12/20 0834)  vecuronium (NORCURON) injection 10 mg (10 mg Intravenous Given 05/12/20 0835)  sodium chloride 0.9 % bolus 250 mL (250 mLs Intravenous New Bag/Given 05/12/20 0836)  rocuronium (ZEMURON) injection 80 mg (80 mg Intravenous Given 05/12/20 05/14/20)    ED Course   Veronica Castaneda Veronica Castaneda is a 59 y.o. female with PMHx listed that presents to the Emergency Department complaint of No chief complaint on file.    ED Course: Initial exam completed.   Ill-appearing.  Hemodynamically stable.  Respiratory distress with lethargy.  Potentially toxic.59  Physical exam significant for obese 59 year old female with bilateral rhonchi, tachypnea with some accessory muscle usage, lethargy on examination, and bilateral lower extremity edema extending to the knee.  Initial differential includes multiple sources of sepsis, CHF exacerbation, flash pulmonary edema, viral pneumonia, bacterial pneumonia, ACS, and asthma/COPD exacerbation.   Given these concerns, broad work-up ordered.  Holding fluid resuscitation at this time given the patient's evidence of significant volume  overload on examination.  CXR consistent with CHF pattern. Troponin negative. Lactic acid negative. INR normal. CMP with no acute electrolyte of nausea quiring urgent intervention. CBC with a mild nonspecific leukocytosis 11.3, stable hemoglobin, and normal platelets. COVID-19 negative. BNP 111.   Initial VBG with pH 7.1 and PCO2 97, in combination with the patient's respiratory failure and volume overload, we proceeded with emergent intubation for acute hypoxic hypercapnic respiratory failure. Difficult airway view with anterior airway. See procedure note above.  Diuresis with 60 mg IV Lasix. Versed/fentanyl for sedation.  Discussed with ICU who agreed to admit the patient for further management. Likely multifactorial etiology, will defer antibiotics at this time to ICU management.    Diagnostics Vital Signs: reviewed Labs: reviewed and significant findings discussed above Imaging: personally reviewed images interpreted by radiology EKG: reviewed Records: nursing notes along with previous records reviewed and pertinent data discussed   Consults:  Critical Care   Reevaluation/Disposition:  Upon reevaluation, patients symptoms remain critical.   All questions answered. Patient and/or family was understanding and in agreement with today's assessment and plan.   59, MD Emergency Medicine, PGY-3   Note: Dragon medical dictation software was used in the creation of this note.   Final Clinical Impression(s) / ED Diagnoses Final diagnoses:  Acute respiratory failure with hypoxia and hypercapnia (HCC)  Acute on chronic congestive heart failure, unspecified heart failure type [facility])    Rx / DC Orders ED Discharge Orders    None       [facility], MD 05/13/20 05/15/20    1607, MD 05/26/20 [phone]

## 2020-05-12 NOTE — Procedures (Signed)
Arterial Catheter Insertion Procedure Note  Liliahna Cudd  [id number]  Nov 18, 1960  Date:05/12/20  Time:5:59 PM    Provider Performing: Martina Sinner    Procedure: Insertion of Arterial Line (70786) with US guidance (75449)   Indication(s) Blood pressure monitoring and/or need for frequent ABGs  Consent Unable to obtain consent due to emergent nature of procedure.  Anesthesia None   Time Out Verified patient identification, verified procedure, site/side was marked, verified correct patient position, special equipment/implants available, medications/allergies/relevant history reviewed, required imaging and test results available.   Sterile Technique Maximal sterile technique including full sterile barrier drape, hand hygiene, sterile gown, sterile gloves, mask, hair covering, sterile ultrasound probe cover (if used).   Procedure Description Area of catheter insertion was cleaned with chlorhexidine and draped in sterile fashion. With real-time ultrasound guidance an arterial catheter was placed into the right radial artery.  Appropriate arterial tracings confirmed on monitor.     Complications/Tolerance None; patient tolerated the procedure well.   EBL Minimal   Specimen(s) None

## 2020-05-12 NOTE — Progress Notes (Signed)
PCCM INTERVAL PROGRESS NOTE   Following up ABG  ABG    Component Value Date/Time   PHART 7.482 (H) 05/12/2020 1945   PCO2ART 52.8 (H) 05/12/2020 1945   PO2ART 77 (L) 05/12/2020 1945   HCO3 39.7 (H) 05/12/2020 1945   TCO2 41 (H) 05/12/2020 1945   O2SAT 96.0 05/12/2020 1945    Vent Mode: PRVC FiO2 (%):  [50 %-100 %] 50 % Set Rate:  [16 bmp-28 bmp] 16 bmp Vt Set:  [350 mL-400 mL] 400 mL PEEP:  [5 cmH20] 5 cmH20 Plateau Pressure:  [16 cmH20-24 cmH20] 21 cmH20   Current tidal volume set to 400, which is well above 8cc/kg IBW for patient height listed 145cm  Will leave rate at 16 and drop tidal volume to 350, which is closer to 8cc/kg and repeat ABG in an hour  Joneen Roach, AGACNP-BC [place] [facility]  See Amion for personal pager PCCM on call pager [phone]  05/12/2020 8:02 PM

## 2020-05-12 NOTE — Progress Notes (Signed)
Patient arrived to 4N and was noted to be air trapping on the ventilator at a respiratory rate of 28 with elevated peak airway pressures. Plateau pressure was around 28 with peep of 5 and a driving pressure of 23. She was given 10mg  of rocuronium and the respiratory rate was decreased to 16 with an I:E time of 1:3.6 with no signs of air trapping. She is on versed, ketamine and fentanyl for sedation. We will start propofol and attempt to wean off versed.   A right radial arterial line was placed. A blood gas will be obtained in 30-60 minutes. We will tolerate permissive hypercapnia and a pH of 7.2 or higher to maintain adequate expiratory time and reduce air trapping.  A total of 30 extra minutes of critical care time was provided outside of procedures.   11-27[birth year]1968, MD [place] [facility] Office: [phone]   See Amion for Pager Details

## 2020-05-12 NOTE — ED Notes (Signed)
Intubation successful. 22 at the teeth.

## 2020-05-12 NOTE — Progress Notes (Signed)
RT x2 attempted Aline per MD order without success. RT notified MD. RT will continue to monitor.

## 2020-05-12 NOTE — ED Notes (Signed)
EDP notified pt hypotensive

## 2020-05-12 NOTE — Progress Notes (Signed)
RT transported patient from ED to 4N27 with RN. No complications.

## 2020-05-13 ENCOUNTER — Inpatient Hospital Stay (HOSPITAL_COMMUNITY): Payer: Self-pay

## 2020-05-13 LAB — CBC
HCT: 42.9 % (ref 36.0–46.0)
Hemoglobin: 12.7 g/dL (ref 12.0–15.0)
MCH: 28.2 pg (ref 26.0–34.0)
MCHC: 29.6 g/dL — ABNORMAL LOW (ref 30.0–36.0)
MCV: 95.3 fL (ref 80.0–100.0)
Platelets: 180 10*3/uL (ref 150–400)
RBC: 4.5 MIL/uL (ref 3.87–5.11)
RDW: 15.2 % (ref 11.5–15.5)
WBC: 10.6 10*3/uL — ABNORMAL HIGH (ref 4.0–10.5)
nRBC: 0 % (ref 0.0–0.2)

## 2020-05-13 LAB — BASIC METABOLIC PANEL
Anion gap: 6 (ref 5–15)
BUN: 9 mg/dL (ref 6–20)
CO2: 33 mmol/L — ABNORMAL HIGH (ref 22–32)
Calcium: 7.4 mg/dL — ABNORMAL LOW (ref 8.9–10.3)
Chloride: 99 mmol/L (ref 98–111)
Creatinine, Ser: 0.5 mg/dL (ref 0.44–1.00)
GFR calc Af Amer: >60 mL/min (ref >60)
GFR calc non Af Amer: >60 mL/min (ref >60)
Glucose, Bld: 132 mg/dL — ABNORMAL HIGH (ref 70–99)
Potassium: 3.4 mmol/L — ABNORMAL LOW (ref 3.5–5.1)
Sodium: 138 mmol/L (ref 135–145)

## 2020-05-13 LAB — GLUCOSE, CAPILLARY
Glucose-Capillary: 157 mg/dL — ABNORMAL HIGH (ref 70–99)
Glucose-Capillary: 136 mg/dL — ABNORMAL HIGH (ref 70–99)
Glucose-Capillary: 141 mg/dL — ABNORMAL HIGH (ref 70–99)
Glucose-Capillary: 134 mg/dL — ABNORMAL HIGH (ref 70–99)
Glucose-Capillary: 154 mg/dL — ABNORMAL HIGH (ref 70–99)

## 2020-05-13 LAB — HEPATIC FUNCTION PANEL
ALT: 16 U/L (ref 0–44)
AST: 21 U/L (ref 15–41)
Albumin: 2.3 g/dL — ABNORMAL LOW (ref 3.5–5.0)
Alkaline Phosphatase: 95 U/L (ref 38–126)
Bilirubin, Direct: <0.1 mg/dL (ref 0.0–0.2)
Total Bilirubin: 0.5 mg/dL (ref 0.3–1.2)
Total Protein: 5.9 g/dL — ABNORMAL LOW (ref 6.5–8.1)

## 2020-05-13 LAB — PHOSPHORUS: Phosphorus: 2.1 mg/dL — ABNORMAL LOW (ref 2.5–4.6)

## 2020-05-13 LAB — MAGNESIUM: Magnesium: 2.4 mg/dL (ref 1.7–2.4)

## 2020-05-13 MED ORDER — POTASSIUM CHLORIDE 20 MEQ/15ML (10%) PO SOLN
40.0000 meq | ORAL | Status: DC
  Administered 2020-05-13: 40 meq

## 2020-05-13 MED ORDER — ADULT MULTIVITAMIN W/MINERALS CH
1.0000 | ORAL_TABLET | Freq: Every day | ORAL | Status: DC
  Administered 2020-05-13 – 2020-05-20 (×7): 1
  Filled 2020-05-13 (×7): qty 1

## 2020-05-13 MED ORDER — PROSOURCE TF PO LIQD
45.0000 mL | Freq: Three times a day (TID) | ORAL | Status: DC
  Administered 2020-05-13 – 2020-05-18 (×15): 45 mL
  Filled 2020-05-13 (×15): qty 45

## 2020-05-13 MED ORDER — POTASSIUM CHLORIDE 20 MEQ/15ML (10%) PO SOLN
40.0000 meq | Freq: Once | ORAL | Status: DC
  Filled 2020-05-13: qty 30

## 2020-05-13 MED ORDER — VITAL HIGH PROTEIN PO LIQD
1000.0000 mL | ORAL | Status: DC
  Administered 2020-05-13 – 2020-05-18 (×7): 1000 mL

## 2020-05-13 NOTE — Progress Notes (Signed)
NAME:  Veronica Castaneda, MRN:  [id number], DOB:  1961-08-22, LOS: 1 ADMISSION DATE:  05/12/2020, CONSULTATION DATE:  05/12/20 REFERRING MD:  ER, CHIEF COMPLAINT:  SOB   Brief History   59 year old woman with hx of CHF presenting with 4 days worsening dyspnea arrived to ER in extremis and intubated.  History of present illness   59 year old woman with hx of CHF presenting with 4 days worsening dyspnea arrived to ER in extremis and intubated.  History per daughter at bedside with interpreter.  Had headaches, subjective fever, wheezing.  Has history of an unspecified lung disease that sounds like asthma that she gets frequent URIs with.  Was intubated last year with flu B and similar presentation.  PCCM asked to admit for acute hypoxemic and presumed hypercarbic resp failure.  Past Medical History  OHS OSA Class 3 obesity Asthma  Significant Hospital Events   8/19 admitted  Consults:  NA  Procedures:  NA  Significant Diagnostic Tests:  CXR- no infiltrates  Micro Data:  COVID neg  Antimicrobials:  Azithromycin 8/19>>   Interim history/subjective:   No issues overnight.  Remains on mechanical support.  Family updated with interpreter at bedside.  Objective   Blood pressure (!) 102/57, pulse (!) 56, temperature 98.6 F (37 C), temperature source Axillary, resp. rate 16, height 4\' 9"  (1.448 m), weight 121.2 kg, SpO2 94 %. CVP:  [8 mmHg-17 mmHg] 14 mmHg  Vent Mode: PRVC FiO2 (%):  [50 %-60 %] 60 % Set Rate:  [16 bmp-28 bmp] 16 bmp Vt Set:  [350 mL-400 mL] 350 mL PEEP:  [5 cmH20] 5 cmH20 Plateau Pressure:  [16 cmH20-24 cmH20] 21 cmH20   Intake/Output Summary (Last 24 hours) at 05/13/2020 1110 Last data filed at 05/13/2020 0700 Gross per 24 hour  Intake 1414.6 ml  Output 3180 ml  Net -1765.4 ml   Filed Weights   05/12/20 0725 05/13/20 0500  Weight: 125.7 kg 121.2 kg    Examination: General: Obese Hispanic female intubated on mechanical life support HENT:  Tracking appropriately Lungs: Bilateral expiratory wheeze, tight air movement Cardiovascular: Regular rate rhythm, S1-S2 Abdomen: Soft, nontender nondistended Extremities: Bilateral trace lower extremity edema Neuro: Sedated Skin: No obvious rash  Resolved Hospital Problem list   N/A  Assessment & Plan:   Acute hypoxemic respiratory failure requiring intubation mechanical ventilation Acute exacerbation of asthma Acute RSV Severe bronchospasm. Baseline likely has OHS and OSA May have mild acute diastolic heart failure. Plan: Diuresis continued to attempt euvolemia. Continue IV ketamine Continue IV steroids Continue scheduled nebs Can use as needed vecuronium for ventilator dyssynchrony as long as she remains well sedate. I suspect she will be on the ventilator for a few days. Bronchospasm has yet to break.  Hypokalemia Plan: We will replete   Best practice:  Diet: TF Pain/Anxiety/Delirium protocol (if indicated): Ketamine, fentanyl, propofol VAP protocol (if indicated): ordered DVT prophylaxis: Lovenox GI prophylaxis: Pepcid Glucose control: SSI Mobility: BR Code Status: full Family Communication: I updated the patient's son at bedside with interpreter Disposition: ICU  Labs   CBC: Recent Labs  Lab 05/12/20 0654 05/12/20 1945 05/12/20 2057 05/13/20 0542  WBC 11.3*  --   --  10.6*  NEUTROABS 8.2*  --   --   --   HGB 14.1 15.0 15.0 12.7  HCT 50.9* 44.0 44.0 42.9  MCV 99.6  --   --  95.3  PLT 186  --   --  180    Basic  Metabolic Panel: Recent Labs  Lab 05/12/20 0654 05/12/20 1945 05/12/20 2057 05/13/20 0542  NA 137 137 140 138  K 4.5 3.2* 3.4* 3.4*  CL 95*  --   --  99  CO2 37*  --   --  33*  GLUCOSE 152*  --   --  132*  BUN 7  --   --  9  CREATININE 0.54  --   --  0.50  CALCIUM 8.4*  --   --  7.4*   GFR: Estimated Creatinine Clearance: 88.8 mL/min (by C-G formula based on SCr of 0.5 mg/dL). Recent Labs  Lab 05/12/20 0654 05/12/20 0716  05/12/20 1233 05/12/20 1245 05/13/20 0542  PROCALCITON  --   --   --  <0.10  --   WBC 11.3*  --   --   --  10.6*  LATICACIDVEN  --  0.6 2.9*  --   --     Liver Function Tests: Recent Labs  Lab 05/12/20 0654 05/13/20 0542  AST 32 21  ALT 24 16  ALKPHOS 122 95  BILITOT 0.7 0.5  PROT 7.5 5.9*  ALBUMIN 3.1* 2.3*   No results for input(s): LIPASE, AMYLASE in the last 168 hours. No results for input(s): AMMONIA in the last 168 hours.  ABG    Component Value Date/Time   PHART 7.440 05/12/2020 2057   PCO2ART 57.3 (H) 05/12/2020 2057   PO2ART 68 (L) 05/12/2020 2057   HCO3 39.2 (H) 05/12/2020 2057   TCO2 41 (H) 05/12/2020 2057   O2SAT 94.0 05/12/2020 2057     Coagulation Profile: Recent Labs  Lab 05/12/20 0716  INR 1.0    Cardiac Enzymes: No results for input(s): CKTOTAL, CKMB, CKMBINDEX, TROPONINI in the last 168 hours.  HbA1C: HbA1c, POC (prediabetic range)  Date/Time Value Ref Range Status  02/11/2020 10:27 AM 5.8 5.7 - 6.4 % Final   Hgb A1c MFr Bld  Date/Time Value Ref Range Status  05/12/2020 12:47 PM 6.4 (H) 4.8 - 5.6 % Final    Comment:    (NOTE) Pre diabetes:          5.7%-6.4%  Diabetes:              >6.4%  Glycemic control for   <7.0% adults with diabetes   08/17/2019 11:44 AM 6.2 (H) 4.8 - 5.6 % Final    Comment:             Prediabetes: 5.7 - 6.4          Diabetes: >6.4          Glycemic control for adults with diabetes: <7.0     CBG: Recent Labs  Lab 05/12/20 1144 05/12/20 1959 05/12/20 2350 05/13/20 0314 05/13/20 0821  GLUCAP 153* 200* 155* 157* 136*    This patient is critically ill with multiple organ system failure; which, requires frequent high complexity decision making, assessment, support, evaluation, and titration of therapies. This was completed through the application of advanced monitoring technologies and extensive interpretation of multiple databases. During this encounter critical care time was devoted to patient care  services described in this note for 32 minutes.  Josephine Igo, DO [facility] 05/13/2020 11:10 AM

## 2020-05-13 NOTE — Progress Notes (Signed)
Initial Nutrition Assessment  DOCUMENTATION CODES:   Morbid obesity  INTERVENTION:   Initiate tube feeding via OG tube: Vital High Protein at 35 ml/h (840 ml per day) Prosource TF 45 ml TID  MVI with minerals daily  Provides 960 kcal, 106 gm protein, 702 ml free water daily TF regimen and propofol at current rate providing 1356 total kcal/day (100 % of kcal needs)   NUTRITION DIAGNOSIS:   Inadequate oral intake related to inability to eat as evidenced by NPO status.  GOAL:   Provide needs based on ASPEN/SCCM guidelines  MONITOR:   TF tolerance  REASON FOR ASSESSMENT:   Consult, Ventilator Enteral/tube feeding initiation and management  ASSESSMENT:   Pt with PMH of OHS, OSA, class 3 obesity, asthma, and CHF admitted with multifactorial acute hypoxemic and hypercarbic respiratory failure.    Pt discussed during ICU rounds and with RN.  Spoke with MD, ok to initiate TF today Son at bedside.   Patient is currently intubated on ventilator support MV: 6 L/min Temp (24hrs), Avg:99.5 F (37.5 C), Min:98.3 F (36.8 C), Max:101.2 F (38.4 C) MAP: 78-99  Propofol: 15 ml/hr provides: 396 kcal  Medications reviewed and include: SSI, solu-medrol Ketamine Levo @ 2 mcg  Labs reviewed: K+ 3.4 CBG's: 136-141 (NPO)     Diet Order:   Diet Order            Diet NPO time specified  Diet effective now                 EDUCATION NEEDS:   No education needs have been identified at this time  Skin:  Skin Assessment: Reviewed RN Assessment  Last BM:  unknown  Height:   Ht Readings from Last 1 Encounters:  05/12/20 4\' 9"  (1.448 m)    Weight:   Wt Readings from Last 1 Encounters:  05/13/20 121.2 kg    Ideal Body Weight:  43.1 kg  BMI:  Body mass index is 57.82 kg/m.  Estimated Nutritional Needs:   Kcal:  1300  Protein:  90-110 grams  Fluid:  >1.5 L/day  05/15/20., RD, LDN, CNSC See [facility] for contact information

## 2020-05-14 DIAGNOSIS — J4551 Severe persistent asthma with (acute) exacerbation: Secondary | ICD-10-CM

## 2020-05-14 LAB — GLUCOSE, CAPILLARY
Glucose-Capillary: 125 mg/dL — ABNORMAL HIGH (ref 70–99)
Glucose-Capillary: 136 mg/dL — ABNORMAL HIGH (ref 70–99)
Glucose-Capillary: 141 mg/dL — ABNORMAL HIGH (ref 70–99)
Glucose-Capillary: 149 mg/dL — ABNORMAL HIGH (ref 70–99)
Glucose-Capillary: 160 mg/dL — ABNORMAL HIGH (ref 70–99)

## 2020-05-14 LAB — BASIC METABOLIC PANEL
Anion gap: 6 (ref 5–15)
BUN: 22 mg/dL — ABNORMAL HIGH (ref 6–20)
CO2: 35 mmol/L — ABNORMAL HIGH (ref 22–32)
Calcium: 8.3 mg/dL — ABNORMAL LOW (ref 8.9–10.3)
Chloride: 103 mmol/L (ref 98–111)
Creatinine, Ser: 0.61 mg/dL (ref 0.44–1.00)
GFR calc Af Amer: >60 mL/min (ref >60)
GFR calc non Af Amer: >60 mL/min (ref >60)
Glucose, Bld: 132 mg/dL — ABNORMAL HIGH (ref 70–99)
Potassium: 4.5 mmol/L (ref 3.5–5.1)
Sodium: 144 mmol/L (ref 135–145)

## 2020-05-14 LAB — CBC
HCT: 42.3 % (ref 36.0–46.0)
Hemoglobin: 12.3 g/dL (ref 12.0–15.0)
MCH: 28.1 pg (ref 26.0–34.0)
MCHC: 29.1 g/dL — ABNORMAL LOW (ref 30.0–36.0)
MCV: 96.8 fL (ref 80.0–100.0)
Platelets: 179 10*3/uL (ref 150–400)
RBC: 4.37 MIL/uL (ref 3.87–5.11)
RDW: 15.4 % (ref 11.5–15.5)
WBC: 9.1 10*3/uL (ref 4.0–10.5)
nRBC: 0 % (ref 0.0–0.2)

## 2020-05-14 LAB — PHOSPHORUS
Phosphorus: 2 mg/dL — ABNORMAL LOW (ref 2.5–4.6)
Phosphorus: 2.7 mg/dL (ref 2.5–4.6)
Phosphorus: 2.8 mg/dL (ref 2.5–4.6)

## 2020-05-14 LAB — MAGNESIUM
Magnesium: 2.5 mg/dL — ABNORMAL HIGH (ref 1.7–2.4)
Magnesium: 2.5 mg/dL — ABNORMAL HIGH (ref 1.7–2.4)
Magnesium: 2.2 mg/dL (ref 1.7–2.4)

## 2020-05-14 LAB — LEGIONELLA PNEUMOPHILA SEROGP 1 UR AG: L. pneumophila Serogp 1 Ur Ag: NEGATIVE

## 2020-05-14 NOTE — Progress Notes (Signed)
NAME:  Veronica Castaneda, MRN:  [id number], DOB:  1961/07/29, LOS: 2 ADMISSION DATE:  05/12/2020, CONSULTATION DATE:  05/12/20 REFERRING MD:  ER, CHIEF COMPLAINT:  SOB   Brief History   59 year old woman with hx of CHF presenting with 4 days worsening dyspnea arrived to ER in extremis and intubated.  History of present illness   59 year old woman with hx of CHF presenting with 4 days worsening dyspnea arrived to ER in extremis and intubated.  History per daughter at bedside with interpreter.  Had headaches, subjective fever, wheezing.  Has history of an unspecified lung disease that sounds like asthma that she gets frequent URIs with.  Was intubated last year with flu B and similar presentation.  PCCM asked to admit for acute hypoxemic and presumed hypercarbic resp failure.  Past Medical History  OHS OSA Class 3 obesity Asthma  Significant Hospital Events   8/19 admitted  Consults:  NA  Procedures:  NA  Significant Diagnostic Tests:  CXR- no infiltrates  Micro Data:  COVID neg  Antimicrobials:  Azithromycin 8/19>>   Interim history/subjective:   Overall no issues overnight.  Low-grade temperature.  Remains on IV ketamine, intubated on mechanical life support  Objective   Blood pressure 111/61, pulse 65, temperature 100 F (37.8 C), temperature source Oral, resp. rate 16, height 4\' 9"  (1.448 m), weight 124.1 kg, SpO2 92 %. CVP:  [14 mmHg-16 mmHg] 14 mmHg  Vent Mode: PRVC FiO2 (%):  [60 %] 60 % Set Rate:  [15 bmp-16 bmp] 16 bmp Vt Set:  [350 mL] 350 mL PEEP:  [5 cmH20] 5 cmH20 Plateau Pressure:  [21 cmH20-26 cmH20] 24 cmH20   Intake/Output Summary (Last 24 hours) at 05/14/2020 05/16/2020 Last data filed at 05/14/2020 0200 Gross per 24 hour  Intake 2657.08 ml  Output 800 ml  Net 1857.08 ml   Filed Weights   05/12/20 0725 05/13/20 0500 05/14/20 0500  Weight: 125.7 kg 121.2 kg 124.1 kg    Examination: General: Obese Hispanic female, intubated mechanical life  support critically ill HENT: Tracking appropriately awakes to voice and with stimulus Lungs: Bilateral air movement, improved since yesterday, no wheezing today Cardiovascular: Regular rate rhythm, S1-S2 Abdomen: Soft, nontender nondistended Extremities: Bilateral lower extremity edema Neuro: Sedated on mechanical support Skin: No rash  Resolved Hospital Problem list   N/A  Assessment & Plan:   Acute hypoxemic respiratory failure requiring intubation mechanical ventilation Acute exacerbation of asthma Acute RSV Severe bronchospasm. Baseline likely has OHS and OSA Acute on chronic diastolic heart failure  Plan: Diuresis to maintain euvolemia Discontinue IV ketamine Continue IV steroids Continue scheduled nebs Appears to have better ventilator synchrony will discontinue as needed paralysis Consider liberation from mechanical support as early as tomorrow. Pressure support trials first thing in the morning.  Hypokalemia Plan: Follow Repeat as needed .  Best practice:  Diet: TF Pain/Anxiety/Delirium protocol (if indicated): Ketamine, fentanyl, propofol VAP protocol (if indicated): ordered DVT prophylaxis: Lovenox GI prophylaxis: Pepcid Glucose control: SSI Mobility: BR Code Status: full Family Communication: patients son updated  Disposition: ICU  Labs   CBC: Recent Labs  Lab 05/12/20 0654 05/12/20 1945 05/12/20 2057 05/13/20 0542 05/14/20 0500  WBC 11.3*  --   --  10.6* 9.1  NEUTROABS 8.2*  --   --   --   --   HGB 14.1 15.0 15.0 12.7 12.3  HCT 50.9* 44.0 44.0 42.9 42.3  MCV 99.6  --   --  95.3 96.8  PLT 186  --   --  180 179    Basic Metabolic Panel: Recent Labs  Lab 05/12/20 0654 05/12/20 1945 05/12/20 2057 05/13/20 0542 05/13/20 1850 05/13/20 2252  NA 137 137 140 138  --   --   K 4.5 3.2* 3.4* 3.4*  --   --   CL 95*  --   --  99  --   --   CO2 37*  --   --  33*  --   --   GLUCOSE 152*  --   --  132*  --   --   BUN 7  --   --  9  --   --     CREATININE 0.54  --   --  0.50  --   --   CALCIUM 8.4*  --   --  7.4*  --   --   MG  --   --   --   --  2.4 2.5*  PHOS  --   --   --   --  2.1* 2.0*   GFR: Estimated Creatinine Clearance: 90.2 mL/min (by C-G formula based on SCr of 0.5 mg/dL). Recent Labs  Lab 05/12/20 0654 05/12/20 0716 05/12/20 1233 05/12/20 1245 05/13/20 0542 05/14/20 0500  PROCALCITON  --   --   --  <0.10  --   --   WBC 11.3*  --   --   --  10.6* 9.1  LATICACIDVEN  --  0.6 2.9*  --   --   --     Liver Function Tests: Recent Labs  Lab 05/12/20 0654 05/13/20 0542  AST 32 21  ALT 24 16  ALKPHOS 122 95  BILITOT 0.7 0.5  PROT 7.5 5.9*  ALBUMIN 3.1* 2.3*   No results for input(s): LIPASE, AMYLASE in the last 168 hours. No results for input(s): AMMONIA in the last 168 hours.  ABG    Component Value Date/Time   PHART 7.440 05/12/2020 2057   PCO2ART 57.3 (H) 05/12/2020 2057   PO2ART 68 (L) 05/12/2020 2057   HCO3 39.2 (H) 05/12/2020 2057   TCO2 41 (H) 05/12/2020 2057   O2SAT 94.0 05/12/2020 2057     Coagulation Profile: Recent Labs  Lab 05/12/20 0716  INR 1.0    Cardiac Enzymes: No results for input(s): CKTOTAL, CKMB, CKMBINDEX, TROPONINI in the last 168 hours.  HbA1C: HbA1c, POC (prediabetic range)  Date/Time Value Ref Range Status  02/11/2020 10:27 AM 5.8 5.7 - 6.4 % Final   Hgb A1c MFr Bld  Date/Time Value Ref Range Status  05/12/2020 12:47 PM 6.4 (H) 4.8 - 5.6 % Final    Comment:    (NOTE) Pre diabetes:          5.7%-6.4%  Diabetes:              >6.4%  Glycemic control for   <7.0% adults with diabetes   08/17/2019 11:44 AM 6.2 (H) 4.8 - 5.6 % Final    Comment:             Prediabetes: 5.7 - 6.4          Diabetes: >6.4          Glycemic control for adults with diabetes: <7.0     CBG: Recent Labs  Lab 05/13/20 0314 05/13/20 0821 05/13/20 1125 05/13/20 1453 05/13/20 2139  GLUCAP 157* 136* 141* 134* 154*    This patient is critically ill with multiple organ  system failure; which,  requires frequent high complexity decision making, assessment, support, evaluation, and titration of therapies. This was completed through the application of advanced monitoring technologies and extensive interpretation of multiple databases. During this encounter critical care time was devoted to patient care services described in this note for 32 minutes.  Josephine Igo, DO [facility] 05/14/2020 7:38 AM

## 2020-05-15 DIAGNOSIS — J9602 Acute respiratory failure with hypercapnia: Secondary | ICD-10-CM

## 2020-05-15 DIAGNOSIS — Z9911 Dependence on respirator [ventilator] status: Secondary | ICD-10-CM

## 2020-05-15 LAB — TRIGLYCERIDES: Triglycerides: 216 mg/dL — ABNORMAL HIGH (ref <150)

## 2020-05-15 LAB — BASIC METABOLIC PANEL
Anion gap: 6 (ref 5–15)
BUN: 26 mg/dL — ABNORMAL HIGH (ref 6–20)
CO2: 37 mmol/L — ABNORMAL HIGH (ref 22–32)
Calcium: 8.5 mg/dL — ABNORMAL LOW (ref 8.9–10.3)
Chloride: 101 mmol/L (ref 98–111)
Creatinine, Ser: 0.61 mg/dL (ref 0.44–1.00)
GFR calc Af Amer: >60 mL/min (ref >60)
GFR calc non Af Amer: >60 mL/min (ref >60)
Glucose, Bld: 129 mg/dL — ABNORMAL HIGH (ref 70–99)
Potassium: 4.5 mmol/L (ref 3.5–5.1)
Sodium: 144 mmol/L (ref 135–145)

## 2020-05-15 LAB — CULTURE, RESPIRATORY W GRAM STAIN: Culture: NORMAL

## 2020-05-15 LAB — GLUCOSE, CAPILLARY
Glucose-Capillary: 132 mg/dL — ABNORMAL HIGH (ref 70–99)
Glucose-Capillary: 134 mg/dL — ABNORMAL HIGH (ref 70–99)
Glucose-Capillary: 143 mg/dL — ABNORMAL HIGH (ref 70–99)
Glucose-Capillary: 117 mg/dL — ABNORMAL HIGH (ref 70–99)
Glucose-Capillary: 117 mg/dL — ABNORMAL HIGH (ref 70–99)

## 2020-05-15 LAB — CBC
HCT: 41.5 % (ref 36.0–46.0)
Hemoglobin: 11.9 g/dL — ABNORMAL LOW (ref 12.0–15.0)
MCH: 27.4 pg (ref 26.0–34.0)
MCHC: 28.7 g/dL — ABNORMAL LOW (ref 30.0–36.0)
MCV: 95.6 fL (ref 80.0–100.0)
Platelets: 153 10*3/uL (ref 150–400)
RBC: 4.34 MIL/uL (ref 3.87–5.11)
RDW: 15.3 % (ref 11.5–15.5)
WBC: 7.8 10*3/uL (ref 4.0–10.5)
nRBC: 0 % (ref 0.0–0.2)

## 2020-05-15 LAB — MRSA PCR SCREENING: MRSA by PCR: NEGATIVE

## 2020-05-15 MED ORDER — METHYLPREDNISOLONE SODIUM SUCC 125 MG IJ SOLR
60.0000 mg | INTRAMUSCULAR | Status: DC
  Administered 2020-05-16 – 2020-05-19 (×4): 60 mg via INTRAVENOUS
  Filled 2020-05-15 (×4): qty 2

## 2020-05-15 MED ORDER — SODIUM CHLORIDE 0.9% FLUSH: 10.0000 mL | INTRAVENOUS | Status: DC | PRN

## 2020-05-15 MED ORDER — SODIUM CHLORIDE 0.9% FLUSH
10.0000 mL | Freq: Two times a day (BID) | INTRAVENOUS | Status: DC
  Administered 2020-05-15 – 2020-05-21 (×12): 10 mL

## 2020-05-15 MED ORDER — FUROSEMIDE 10 MG/ML IJ SOLN
40.0000 mg | Freq: Four times a day (QID) | INTRAMUSCULAR | Status: AC
  Administered 2020-05-15 (×2): 40 mg via INTRAVENOUS
  Filled 2020-05-15 (×2): qty 4

## 2020-05-15 NOTE — Progress Notes (Signed)
NAME:  Veronica Castaneda, MRN:  [id number], DOB:  09-12-1961, LOS: 3 ADMISSION DATE:  05/12/2020, CONSULTATION DATE:  05/12/20 REFERRING MD:  ER, CHIEF COMPLAINT:  SOB   Brief History   59 year old woman with hx of CHF presenting with 4 days worsening dyspnea arrived to ER in extremis and intubated.  History of present illness   59 year old woman with hx of CHF presenting with 4 days worsening dyspnea arrived to ER in extremis and intubated.  History per daughter at bedside with interpreter.  Had headaches, subjective fever, wheezing.  Has history of an unspecified lung disease that sounds like asthma that she gets frequent URIs with.  Was intubated last year with flu B and similar presentation.  PCCM asked to admit for acute hypoxemic and presumed hypercarbic resp failure.  Past Medical History  OHS OSA Class 3 obesity Asthma  Significant Hospital Events   8/19 admitted  Consults:  NA  Procedures:  NA  Significant Diagnostic Tests:  CXR- no infiltrates  Micro Data:  COVID neg  Antimicrobials:  Azithromycin 8/19>>   Interim history/subjective:   Doing ok. Still on high Fio2 requirements. Off ketamine. No issues overnight   Objective   Blood pressure (!) 97/50, pulse 67, temperature 99 F (37.2 C), temperature source Axillary, resp. rate 16, height 4\' 9"  (1.448 m), weight 124.1 kg, SpO2 92 %. CVP:  [30 mmHg] 30 mmHg  Vent Mode: PRVC FiO2 (%):  [60 %-100 %] 80 % Set Rate:  [16 bmp] 16 bmp Vt Set:  [350 mL] 350 mL PEEP:  [5 cmH20] 5 cmH20 Plateau Pressure:  [21 cmH20-25 cmH20] 21 cmH20   Intake/Output Summary (Last 24 hours) at 05/15/2020 1126 Last data filed at 05/15/2020 1000 Gross per 24 hour  Intake 2732.38 ml  Output 1400 ml  Net 1332.38 ml   Filed Weights   05/12/20 0725 05/13/20 0500 05/14/20 0500  Weight: 125.7 kg 121.2 kg 124.1 kg    Examination: General: obese hispanic fm, intubated on life support HENT: tracking following commands  Lungs:  BL vented breaths. No wheeze  Cardiovascular: RRR, s1 s2  Abdomen: soft nt nd  Extremities: BL LE trace edema  Neuro: sedated on vent, lightly, following commands when aroused  Skin: no rash   Resolved Hospital Problem list   N/A  Assessment & Plan:   Acute hypoxemic respiratory failure requiring intubation mechanical ventilation Acute exacerbation of asthma Acute RSV Severe bronchospasm, resolved, s/p nebs, ketamine infusion  Baseline likely has OHS and OSA Acute on chronic diastolic heart failure  Plan: Lasix 40mg  q6H X2 doses  Follow UOP Decrease to 60mg  solumedrol qday  Continue scheduled nebs  Wean Fio2 as tolerated to maintain sats >90%  Repeat CXR  Once euvolemic could consider ps trial SBT SAT in AM for possible extubation she just doesn't look ready today  Likely benefit from BIPAP post extubation   Hypokalemia Plan: Replete as needed   Best practice:  Diet: TF Pain/Anxiety/Delirium protocol (if indicated): fentanyl, propofol VAP protocol (if indicated): ordered DVT prophylaxis: Lovenox GI prophylaxis: Pepcid Glucose control: SSI Mobility: BR Code Status: full Family Communication: family updated with translator over weekend  Disposition: ICU  Labs   CBC: Recent Labs  Lab 05/12/20 0654 05/12/20 0654 05/12/20 1945 05/12/20 2057 05/13/20 0542 05/14/20 0500 05/15/20 0500  WBC 11.3*  --   --   --  10.6* 9.1 7.8  NEUTROABS 8.2*  --   --   --   --   --   --  HGB 14.1   < > 15.0 15.0 12.7 12.3 11.9*  HCT 50.9*   < > 44.0 44.0 42.9 42.3 41.5  MCV 99.6  --   --   --  95.3 96.8 95.6  PLT 186  --   --   --  180 179 153   < > = values in this interval not displayed.    Basic Metabolic Panel: Recent Labs  Lab 05/12/20 0654 05/12/20 0654 05/12/20 1945 05/12/20 2057 05/13/20 0542 05/13/20 1850 05/13/20 2252 05/14/20 0500 05/14/20 1700 05/15/20 0500  NA 137   < > 137 140 138  --   --  144  --  144  K 4.5   < > 3.2* 3.4* 3.4*  --   --  4.5  --   4.5  CL 95*  --   --   --  99  --   --  103  --  101  CO2 37*  --   --   --  33*  --   --  35*  --  37*  GLUCOSE 152*  --   --   --  132*  --   --  132*  --  129*  BUN 7  --   --   --  9  --   --  22*  --  26*  CREATININE 0.54  --   --   --  0.50  --   --  0.61  --  0.61  CALCIUM 8.4*  --   --   --  7.4*  --   --  8.3*  --  8.5*  MG  --   --   --   --   --  2.4 2.5* 2.5* 2.2  --   PHOS  --   --   --   --   --  2.1* 2.0* 2.7 2.8  --    < > = values in this interval not displayed.   GFR: Estimated Creatinine Clearance: 90.2 mL/min (by C-G formula based on SCr of 0.61 mg/dL). Recent Labs  Lab 05/12/20 0654 05/12/20 0716 05/12/20 1233 05/12/20 1245 05/13/20 0542 05/14/20 0500 05/15/20 0500  PROCALCITON  --   --   --  <0.10  --   --   --   WBC 11.3*  --   --   --  10.6* 9.1 7.8  LATICACIDVEN  --  0.6 2.9*  --   --   --   --     Liver Function Tests: Recent Labs  Lab 05/12/20 0654 05/13/20 0542  AST 32 21  ALT 24 16  ALKPHOS 122 95  BILITOT 0.7 0.5  PROT 7.5 5.9*  ALBUMIN 3.1* 2.3*   No results for input(s): LIPASE, AMYLASE in the last 168 hours. No results for input(s): AMMONIA in the last 168 hours.  ABG    Component Value Date/Time   PHART 7.440 05/12/2020 2057   PCO2ART 57.3 (H) 05/12/2020 2057   PO2ART 68 (L) 05/12/2020 2057   HCO3 39.2 (H) 05/12/2020 2057   TCO2 41 (H) 05/12/2020 2057   O2SAT 94.0 05/12/2020 2057     Coagulation Profile: Recent Labs  Lab 05/12/20 0716  INR 1.0    Cardiac Enzymes: No results for input(s): CKTOTAL, CKMB, CKMBINDEX, TROPONINI in the last 168 hours.  HbA1C: HbA1c, POC (prediabetic range)  Date/Time Value Ref Range Status  02/11/2020 10:27 AM 5.8 5.7 - 6.4 % Final   Hgb A1c MFr Bld  Date/Time Value Ref Range Status  05/12/2020 12:47 PM 6.4 (H) 4.8 - 5.6 % Final    Comment:    (NOTE) Pre diabetes:          5.7%-6.4%  Diabetes:              >6.4%  Glycemic control for   <7.0% adults with diabetes   08/17/2019  11:44 AM 6.2 (H) 4.8 - 5.6 % Final    Comment:             Prediabetes: 5.7 - 6.4          Diabetes: >6.4          Glycemic control for adults with diabetes: <7.0     CBG: Recent Labs  Lab 05/14/20 1547 05/14/20 2037 05/14/20 2357 05/15/20 0422 05/15/20 0814  GLUCAP 125* 160* 141* 132* 134*    This patient is critically ill with multiple organ system failure; which, requires frequent high complexity decision making, assessment, support, evaluation, and titration of therapies. This was completed through the application of advanced monitoring technologies and extensive interpretation of multiple databases. During this encounter critical care time was devoted to patient care services described in this note for 32 minutes.  Josephine Igo, DO [facility] 05/15/2020 11:26 AM

## 2020-05-16 ENCOUNTER — Inpatient Hospital Stay (HOSPITAL_COMMUNITY): Payer: Self-pay

## 2020-05-16 LAB — CBC
HCT: 42.4 % (ref 36.0–46.0)
Hemoglobin: 12.4 g/dL (ref 12.0–15.0)
MCH: 27.6 pg (ref 26.0–34.0)
MCHC: 29.2 g/dL — ABNORMAL LOW (ref 30.0–36.0)
MCV: 94.4 fL (ref 80.0–100.0)
Platelets: 149 10*3/uL — ABNORMAL LOW (ref 150–400)
RBC: 4.49 MIL/uL (ref 3.87–5.11)
RDW: 15.2 % (ref 11.5–15.5)
WBC: 8.2 10*3/uL (ref 4.0–10.5)
nRBC: 0 % (ref 0.0–0.2)

## 2020-05-16 LAB — POCT I-STAT 7, (LYTES, BLD GAS, ICA,H+H)
Acid-Base Excess: 20 mmol/L — ABNORMAL HIGH (ref 0.0–2.0)
Bicarbonate: 47.4 mmol/L — ABNORMAL HIGH (ref 20.0–28.0)
Calcium, Ion: 1.13 mmol/L — ABNORMAL LOW (ref 1.15–1.40)
HCT: 39 % (ref 36.0–46.0)
Hemoglobin: 13.3 g/dL (ref 12.0–15.0)
O2 Saturation: 97 %
Patient temperature: 99.2
Potassium: 3.9 mmol/L (ref 3.5–5.1)
Sodium: 138 mmol/L (ref 135–145)
TCO2: 49 mmol/L — ABNORMAL HIGH (ref 22–32)
pCO2 arterial: 68.7 mmHg (ref 32.0–48.0)
pH, Arterial: 7.448 (ref 7.350–7.450)
pO2, Arterial: 91 mmHg (ref 83.0–108.0)

## 2020-05-16 LAB — BASIC METABOLIC PANEL
Anion gap: 9 (ref 5–15)
BUN: 31 mg/dL — ABNORMAL HIGH (ref 6–20)
CO2: 37 mmol/L — ABNORMAL HIGH (ref 22–32)
Calcium: 8.4 mg/dL — ABNORMAL LOW (ref 8.9–10.3)
Chloride: 97 mmol/L — ABNORMAL LOW (ref 98–111)
Creatinine, Ser: 0.67 mg/dL (ref 0.44–1.00)
GFR calc Af Amer: >60 mL/min (ref >60)
GFR calc non Af Amer: >60 mL/min (ref >60)
Glucose, Bld: 106 mg/dL — ABNORMAL HIGH (ref 70–99)
Potassium: 3.6 mmol/L (ref 3.5–5.1)
Sodium: 143 mmol/L (ref 135–145)

## 2020-05-16 LAB — GLUCOSE, CAPILLARY
Glucose-Capillary: 113 mg/dL — ABNORMAL HIGH (ref 70–99)
Glucose-Capillary: 117 mg/dL — ABNORMAL HIGH (ref 70–99)
Glucose-Capillary: 119 mg/dL — ABNORMAL HIGH (ref 70–99)
Glucose-Capillary: 124 mg/dL — ABNORMAL HIGH (ref 70–99)
Glucose-Capillary: 126 mg/dL — ABNORMAL HIGH (ref 70–99)
Glucose-Capillary: 134 mg/dL — ABNORMAL HIGH (ref 70–99)
Glucose-Capillary: 157 mg/dL — ABNORMAL HIGH (ref 70–99)
Glucose-Capillary: 162 mg/dL — ABNORMAL HIGH (ref 70–99)
Glucose-Capillary: 170 mg/dL — ABNORMAL HIGH (ref 70–99)

## 2020-05-16 MED ORDER — FAMOTIDINE 40 MG/5ML PO SUSR: 20.0000 mg | Freq: Two times a day (BID) | ORAL | Status: DC

## 2020-05-16 MED ORDER — DOCUSATE SODIUM 50 MG/5ML PO LIQD
100.0000 mg | Freq: Two times a day (BID) | ORAL | Status: DC | PRN
  Administered 2020-05-16: 100 mg
  Filled 2020-05-16: qty 10

## 2020-05-16 MED ORDER — POLYETHYLENE GLYCOL 3350 17 G PO PACK
17.0000 g | PACK | Freq: Every day | ORAL | Status: DC | PRN
  Administered 2020-05-16: 17 g
  Filled 2020-05-16: qty 1

## 2020-05-16 MED ORDER — POTASSIUM CHLORIDE 20 MEQ/15ML (10%) PO SOLN
40.0000 meq | Freq: Two times a day (BID) | ORAL | Status: AC
  Administered 2020-05-16 (×2): 40 meq
  Filled 2020-05-16 (×2): qty 30

## 2020-05-16 MED ORDER — FUROSEMIDE 10 MG/ML IJ SOLN
40.0000 mg | Freq: Two times a day (BID) | INTRAMUSCULAR | Status: AC
  Administered 2020-05-16 – 2020-05-17 (×4): 40 mg via INTRAVENOUS
  Filled 2020-05-16 (×4): qty 4

## 2020-05-16 MED ORDER — FAMOTIDINE 40 MG/5ML PO SUSR
20.0000 mg | Freq: Two times a day (BID) | ORAL | Status: DC
  Administered 2020-05-16 – 2020-05-20 (×9): 20 mg
  Filled 2020-05-16 (×10): qty 2.5

## 2020-05-16 NOTE — Progress Notes (Addendum)
eLink Physician-Brief Progress Note [name] DOB: 05-Aug-1961 MRN: [id number]   Date of Service  05/16/2020  HPI/Events of Note  Asked to modify vent order to reflect what is currently being placed on patient. Current settings are PRVC VT 350, RR 16, Fio2 70, peep 10. Patient with O2 sat 94 and no distress. Order does not match.   eICU Interventions  Changed the order with VT set at 7 cc/kb IBW for now, and will get an ABG at 9.30 pm. Asked RT to call us with those results. Last ABG was on 8/19.      Intervention Category Major Interventions: Respiratory failure - evaluation and management  Kanchan Gal G Keah Lamba 05/16/2020, 8:12 PM   11:30 am- RT called with ABG Po2 in 90s, O2 sat is 97 Leave the VT, RR and peep as is Lower fio2 to keep O2 sat > 92 D/W RT

## 2020-05-16 NOTE — Progress Notes (Signed)
NAME:  Veronica Castaneda, MRN:  [id number], DOB:  1961/04/28, LOS: 4 ADMISSION DATE:  05/12/2020, CONSULTATION DATE:  05/12/20 REFERRING MD:  ER, CHIEF COMPLAINT:  SOB   Brief History   59 year old woman with hx of CHF presenting with 4 days worsening dyspnea arrived to ER in extremis and intubated.  History of present illness   59 year old woman with hx of CHF presenting with 4 days worsening dyspnea arrived to ER in extremis and intubated.  History per daughter at bedside with interpreter.  Had headaches, subjective fever, wheezing.  Has history of an unspecified lung disease that sounds like asthma that she gets frequent URIs with.  Was intubated last year with flu B and similar presentation.  PCCM asked to admit for acute hypoxemic and presumed hypercarbic resp failure.  Past Medical History  OHS OSA Class 3 obesity Asthma  Significant Hospital Events   8/19 admitted  Consults:  NA  Procedures:  8/19 ETT>  8/19 CVC> 8/19 Art line>   Significant Diagnostic Tests:  CXR- no infiltrates  Micro Data:  COVID neg  Antimicrobials:  Azithromycin 8/19  Interim history/subjective:   Remains intubated, FiO2 70%  NAEO  Objective   Blood pressure 127/75, pulse 77, temperature 99.1 F (37.3 C), temperature source Axillary, resp. rate 16, height 4\' 9"  (1.448 m), weight 125 kg, SpO2 91 %.    Vent Mode: PRVC FiO2 (%):  [70 %-80 %] 70 % Set Rate:  [16 bmp] 16 bmp Vt Set:  [350 mL] 350 mL PEEP:  [5 cmH20] 5 cmH20 Plateau Pressure:  [20 cmH20-24 cmH20] 24 cmH20   Intake/Output Summary (Last 24 hours) at 05/16/2020 05/18/2020 Last data filed at 05/16/2020 0800 Gross per 24 hour  Intake 2155.02 ml  Output 4675 ml  Net -2519.98 ml   Filed Weights   05/13/20 0500 05/14/20 0500 05/16/20 0500  Weight: 121.2 kg 124.1 kg 125 kg    Examination: General: Obese middle aged F, intubated sedated NAD  HENT: NCAT  ETT OGT secure anicteric sclera  Lungs: Distant breath sounds,  symmetrical chest expansion. No wheeze.  Cardiovascular: rrr s1s2 no rgm cap refill < 3 seconds  Abdomen: obese soft round ndnt   Extremities: BUE BLE non-pitting edema  Neuro: sedated PERRL. Awakens and follows commands  Skin: c/d/w intact   Resolved Hospital Problem list    Severe bronchospasm, resolved, s/p nebs, ketamine infusion   Assessment & Plan:   Acute hypoxemic respiratory failure requiring intubation mechanical ventilation Acute exacerbation of asthma Acute RSV infection Baseline likely has OHS and OSA Acute on chronic diastolic heart failure  Plan: Continue diuresis 60mg  solumedrol qday (decreased 8/22) Continue nebs  Wean Fio2 as tolerated to maintain sats >90%  Not ready for SBT at this time, continue to monitor   Borderline hypokalemia Plan: Replete as needed with diuresis   Best practice:  Diet: EN Pain/Anxiety/Delirium protocol (if indicated): fentanyl, propofol VAP protocol (if indicated): ordered DVT prophylaxis: Lovenox GI prophylaxis: Pepcid Glucose control: SSI Mobility: BR Code Status: full Family Communication: pending 8/23  Disposition: ICU  Labs   CBC: Recent Labs  Lab 05/12/20 0654 05/12/20 1945 05/12/20 2057 05/13/20 0542 05/14/20 0500 05/15/20 0500 05/16/20 0500  WBC 11.3*  --   --  10.6* 9.1 7.8 8.2  NEUTROABS 8.2*  --   --   --   --   --   --   HGB 14.1   < > 15.0 12.7 12.3 11.9* 12.4  HCT  50.9*   < > 44.0 42.9 42.3 41.5 42.4  MCV 99.6  --   --  95.3 96.8 95.6 94.4  PLT 186  --   --  180 179 153 149*   < > = values in this interval not displayed.    Basic Metabolic Panel: Recent Labs  Lab 05/12/20 0654 05/12/20 1945 05/12/20 2057 05/13/20 0542 05/13/20 1850 05/13/20 2252 05/14/20 0500 05/14/20 1700 05/15/20 0500 05/16/20 0500  NA 137   < > 140 138  --   --  144  --  144 143  K 4.5   < > 3.4* 3.4*  --   --  4.5  --  4.5 3.6  CL 95*  --   --  99  --   --  103  --  101 97*  CO2 37*  --   --  33*  --   --  35*   --  37* 37*  GLUCOSE 152*  --   --  132*  --   --  132*  --  129* 106*  BUN 7  --   --  9  --   --  22*  --  26* 31*  CREATININE 0.54  --   --  0.50  --   --  0.61  --  0.61 0.67  CALCIUM 8.4*  --   --  7.4*  --   --  8.3*  --  8.5* 8.4*  MG  --   --   --   --  2.4 2.5* 2.5* 2.2  --   --   PHOS  --   --   --   --  2.1* 2.0* 2.7 2.8  --   --    < > = values in this interval not displayed.   GFR: Estimated Creatinine Clearance: 90.7 mL/min (by C-G formula based on SCr of 0.67 mg/dL). Recent Labs  Lab 05/12/20 0654 05/12/20 0716 05/12/20 1233 05/12/20 1245 05/13/20 0542 05/14/20 0500 05/15/20 0500 05/16/20 0500  PROCALCITON  --   --   --  <0.10  --   --   --   --   WBC   < >  --   --   --  10.6* 9.1 7.8 8.2  LATICACIDVEN  --  0.6 2.9*  --   --   --   --   --    < > = values in this interval not displayed.    Liver Function Tests: Recent Labs  Lab 05/12/20 0654 05/13/20 0542  AST 32 21  ALT 24 16  ALKPHOS 122 95  BILITOT 0.7 0.5  PROT 7.5 5.9*  ALBUMIN 3.1* 2.3*   No results for input(s): LIPASE, AMYLASE in the last 168 hours. No results for input(s): AMMONIA in the last 168 hours.  ABG    Component Value Date/Time   PHART 7.440 05/12/2020 2057   PCO2ART 57.3 (H) 05/12/2020 2057   PO2ART 68 (L) 05/12/2020 2057   HCO3 39.2 (H) 05/12/2020 2057   TCO2 41 (H) 05/12/2020 2057   O2SAT 94.0 05/12/2020 2057     Coagulation Profile: Recent Labs  Lab 05/12/20 0716  INR 1.0    Cardiac Enzymes: No results for input(s): CKTOTAL, CKMB, CKMBINDEX, TROPONINI in the last 168 hours.  HbA1C: HbA1c, POC (prediabetic range)  Date/Time Value Ref Range Status  02/11/2020 10:27 AM 5.8 5.7 - 6.4 % Final   Hgb A1c MFr Bld  Date/Time Value Ref Range Status  05/12/2020 12:47 PM 6.4 (H) 4.8 - 5.6 % Final    Comment:    (NOTE) Pre diabetes:          5.7%-6.4%  Diabetes:              >6.4%  Glycemic control for   <7.0% adults with diabetes   08/17/2019 11:44 AM 6.2 (H)  4.8 - 5.6 % Final    Comment:             Prediabetes: 5.7 - 6.4          Diabetes: >6.4          Glycemic control for adults with diabetes: <7.0     CBG: Recent Labs  Lab 05/15/20 0814 05/15/20 1154 05/15/20 1529 05/15/20 1947 05/16/20 0815  GLUCAP 134* 143* 117* 117* 170*    CRITICAL CARE Performed by: Lanier Clam   Total critical care time: 35 minutes  Critical care time was exclusive of separately billable procedures and treating other patients.  Critical care was necessary to treat or prevent imminent or life-threatening deterioration.  Critical care was time spent personally by me on the following activities: development of treatment plan with patient and/or surrogate as well as nursing, discussions with consultants, evaluation of patient's response to treatment, examination of patient, obtaining history from patient or surrogate, ordering and performing treatments and interventions, ordering and review of laboratory studies, ordering and review of radiographic studies, pulse oximetry and re-evaluation of patient's condition.

## 2020-05-16 NOTE — Progress Notes (Signed)
RT obtained ABG with the following results and criticals. Pola Corn MD Kamat given critical PCO2. No changes at this time. RT will continue to monitor.   Results for Veronica Castaneda, Veronica Castaneda (MRN [id number]) as of 05/16/2020 23:26  Ref. Range 05/16/2020 23:02  Sample type Unknown ARTERIAL  pH, Arterial Latest Ref Range: 7.35 - 7.45  7.448  pCO2 arterial Latest Ref Range: 32 - 48 mmHg 68.7 (HH)  pO2, Arterial Latest Ref Range: 83 - 108 mmHg 91  TCO2 Latest Ref Range: 22 - 32 mmol/L 49 (H)  Acid-Base Excess Latest Ref Range: 0.0 - 2.0 mmol/L 20.0 (H)  Bicarbonate Latest Ref Range: 20.0 - 28.0 mmol/L 47.4 (H)  O2 Saturation Latest Units: % 97.0  Patient temperature Unknown 99.2 F  Collection site Unknown art line

## 2020-05-17 ENCOUNTER — Ambulatory Visit (HOSPITAL_BASED_OUTPATIENT_CLINIC_OR_DEPARTMENT_OTHER): Payer: Self-pay | Admitting: Internal Medicine

## 2020-05-17 LAB — BASIC METABOLIC PANEL
Anion gap: 12 (ref 5–15)
BUN: 34 mg/dL — ABNORMAL HIGH (ref 6–20)
CO2: 38 mmol/L — ABNORMAL HIGH (ref 22–32)
Calcium: 8.7 mg/dL — ABNORMAL LOW (ref 8.9–10.3)
Chloride: 95 mmol/L — ABNORMAL LOW (ref 98–111)
Creatinine, Ser: 0.61 mg/dL (ref 0.44–1.00)
GFR calc Af Amer: >60 mL/min (ref >60)
GFR calc non Af Amer: >60 mL/min (ref >60)
Glucose, Bld: 122 mg/dL — ABNORMAL HIGH (ref 70–99)
Potassium: 3.7 mmol/L (ref 3.5–5.1)
Sodium: 145 mmol/L (ref 135–145)

## 2020-05-17 LAB — POCT I-STAT 7, (LYTES, BLD GAS, ICA,H+H)
Acid-Base Excess: 17 mmol/L — ABNORMAL HIGH (ref 0.0–2.0)
Bicarbonate: 47.9 mmol/L — ABNORMAL HIGH (ref 20.0–28.0)
Calcium, Ion: 1.17 mmol/L (ref 1.15–1.40)
HCT: 49 % — ABNORMAL HIGH (ref 36.0–46.0)
Hemoglobin: 16.7 g/dL — ABNORMAL HIGH (ref 12.0–15.0)
O2 Saturation: 95 %
Patient temperature: 98.6
Potassium: 3.9 mmol/L (ref 3.5–5.1)
Sodium: 142 mmol/L (ref 135–145)
TCO2: >50 mmol/L — ABNORMAL HIGH (ref 22–32)
pCO2 arterial: 84.3 mmHg (ref 32.0–48.0)
pH, Arterial: 7.363 (ref 7.350–7.450)
pO2, Arterial: 86 mmHg (ref 83.0–108.0)

## 2020-05-17 LAB — CBC
HCT: 43.3 % (ref 36.0–46.0)
Hemoglobin: 12.8 g/dL (ref 12.0–15.0)
MCH: 27.9 pg (ref 26.0–34.0)
MCHC: 29.6 g/dL — ABNORMAL LOW (ref 30.0–36.0)
MCV: 94.3 fL (ref 80.0–100.0)
Platelets: 149 10*3/uL — ABNORMAL LOW (ref 150–400)
RBC: 4.59 MIL/uL (ref 3.87–5.11)
RDW: 14.9 % (ref 11.5–15.5)
WBC: 7.4 10*3/uL (ref 4.0–10.5)
nRBC: 0 % (ref 0.0–0.2)

## 2020-05-17 LAB — CULTURE, BLOOD (ROUTINE X 2)
Culture: NO GROWTH
Culture: NO GROWTH
Special Requests: ADEQUATE

## 2020-05-17 LAB — GLUCOSE, CAPILLARY
Glucose-Capillary: 109 mg/dL — ABNORMAL HIGH (ref 70–99)
Glucose-Capillary: 110 mg/dL — ABNORMAL HIGH (ref 70–99)
Glucose-Capillary: 126 mg/dL — ABNORMAL HIGH (ref 70–99)
Glucose-Capillary: 165 mg/dL — ABNORMAL HIGH (ref 70–99)
Glucose-Capillary: 180 mg/dL — ABNORMAL HIGH (ref 70–99)

## 2020-05-17 MED ORDER — ACETAZOLAMIDE 250 MG PO TABS
250.0000 mg | ORAL_TABLET | Freq: Two times a day (BID) | ORAL | Status: AC
  Administered 2020-05-17 – 2020-05-18 (×3): 250 mg
  Filled 2020-05-17 (×4): qty 1

## 2020-05-17 NOTE — Progress Notes (Signed)
NAME:  Veronica Castaneda, MRN:  [id number], DOB:  [birth year], LOS: 5 ADMISSION DATE:  05/12/2020, CONSULTATION DATE:  05/12/20 REFERRING MD:  ER, CHIEF COMPLAINT:  SOB   Brief History   59 year old woman with hx of CHF presenting with 4 days worsening dyspnea arrived to ER in extremis and intubated.  History of present illness   59 year old woman with hx of CHF presenting with 4 days worsening dyspnea arrived to ER in extremis and intubated.  History per daughter at bedside with interpreter.  Had headaches, subjective fever, wheezing.  Has history of an unspecified lung disease that sounds like asthma that she gets frequent URIs with.  Was intubated last year with flu B and similar presentation.  PCCM asked to admit for acute hypoxemic and presumed hypercarbic resp failure.  Past Medical History  OHS OSA Class 3 obesity Asthma  Significant Hospital Events   8/19 admitted  Consults:  NA  Procedures:  8/19 ETT>  8/19 CVC> 8/19 Art line>   Significant Diagnostic Tests:  CXR- no infiltrates  Micro Data:  COVID neg  Antimicrobials:  Azithromycin 8/19  Interim history/subjective:   No overnight events. Coughing with decreased sedation.   Objective   Blood pressure 101/65, pulse (!) 56, temperature 99.2 F (37.3 C), temperature source Axillary, resp. rate 14, height 5' (1.524 m), weight 120.3 kg, SpO2 94 %.    Vent Mode: PRVC FiO2 (%):  [50 %-70 %] 50 % Set Rate:  [16 bmp] 16 bmp Vt Set:  [320 mL-350 mL] 320 mL PEEP:  [5 cmH20] 5 cmH20 Plateau Pressure:  [16 cmH20-21 cmH20] 20 cmH20   Intake/Output Summary (Last 24 hours) at 05/17/2020 1015 Last data filed at 05/17/2020 0900 Gross per 24 hour  Intake 1817.28 ml  Output 3175 ml  Net -1357.72 ml   Filed Weights   05/14/20 0500 05/16/20 0500 05/17/20 0423  Weight: 124.1 kg 125 kg 120.3 kg    Examination: General: morbidly obese middle aged F, intubated sedated NAD  HENT: NCAT  ETT OGT secure anicteric  sclera  Lungs: crackles have resolved. Still diffuse wheezing. Has tolerated transition to PSV.  Cardiovascular: rrr s1s2 no rgm cap refill < 3 seconds  Abdomen: obese soft round ndnt   Extremities: BUE BLE non-pitting edema  Neuro: sedated PERRL. Awakens and follows commands  Skin: c/d/w intact   Resolved Hospital Problem list    Severe bronchospasm, resolved, s/p nebs, ketamine infusion   Assessment & Plan:   Acute hypoxemic respiratory failure requiring intubation mechanical ventilation Acute exacerbation of asthma Acute RSV infection Baseline likely has OHS and OSA Acute on chronic diastolic heart failure    PLAN Continue diuresis Taper steroids. Continue nebs  SBT today with potential extubation to BiPAP    Best practice:  Diet: EN - pause in anticipation of extubation.  Pain/Anxiety/Delirium protocol (if indicated): fentanyl, propofol - wean to off VAP protocol (if indicated): ordered DVT prophylaxis: Lovenox GI prophylaxis: Pepcid Glucose control: SSI Mobility: BR Code Status: full Family Communication: updated in Spanish 8.  Disposition: ICU  Labs   CBC: Recent Labs  Lab 05/12/20 0654 05/12/20 1945 05/13/20 0542 05/13/20 0542 05/14/20 0500 05/15/20 0500 05/16/20 0500 05/16/20 2302 05/17/20 0429  WBC 11.3*  --  10.6*  --  9.1 7.8 8.2  --  7.4  NEUTROABS 8.2*  --   --   --   --   --   --   --   --   HGB  14.1   < > 12.7   < > 12.3 11.9* 12.4 13.3 12.8  HCT 50.9*   < > 42.9   < > 42.3 41.5 42.4 39.0 43.3  MCV 99.6  --  95.3  --  96.8 95.6 94.4  --  94.3  PLT 186  --  180  --  179 153 149*  --  149*   < > = values in this interval not displayed.    Basic Metabolic Panel: Recent Labs  Lab 05/13/20 0542 05/13/20 0542 05/13/20 1850 05/13/20 2252 05/14/20 0500 05/14/20 1700 05/15/20 0500 05/16/20 0500 05/16/20 2302 05/17/20 0429  NA 138   < >  --   --  144  --  144 143 138 145  K 3.4*   < >  --   --  4.5  --  4.5 3.6 3.9 3.7  CL 99  --   --    --  103  --  101 97*  --  95*  CO2 33*  --   --   --  35*  --  37* 37*  --  38*  GLUCOSE 132*  --   --   --  132*  --  129* 106*  --  122*  BUN 9  --   --   --  22*  --  26* 31*  --  34*  CREATININE 0.50  --   --   --  0.61  --  0.61 0.67  --  0.61  CALCIUM 7.4*  --   --   --  8.3*  --  8.5* 8.4*  --  8.7*  MG  --   --  2.4 2.5* 2.5* 2.2  --   --   --   --   PHOS  --   --  2.1* 2.0* 2.7 2.8  --   --   --   --    < > = values in this interval not displayed.   GFR: Estimated Creatinine Clearance: 93.5 mL/min (by C-G formula based on SCr of 0.61 mg/dL). Recent Labs  Lab 05/12/20 0716 05/12/20 1233 05/12/20 1245 05/13/20 0542 05/14/20 0500 05/15/20 0500 05/16/20 0500 05/17/20 0429  PROCALCITON  --   --  <0.10  --   --   --   --   --   WBC  --   --   --    < > 9.1 7.8 8.2 7.4  LATICACIDVEN 0.6 2.9*  --   --   --   --   --   --    < > = values in this interval not displayed.    Liver Function Tests: Recent Labs  Lab 05/12/20 0654 05/13/20 0542  AST 32 21  ALT 24 16  ALKPHOS 122 95  BILITOT 0.7 0.5  PROT 7.5 5.9*  ALBUMIN 3.1* 2.3*   No results for input(s): LIPASE, AMYLASE in the last 168 hours. No results for input(s): AMMONIA in the last 168 hours.  ABG    Component Value Date/Time   PHART 7.448 05/16/2020 2302   PCO2ART 68.7 (HH) 05/16/2020 2302   PO2ART 91 05/16/2020 2302   HCO3 47.4 (H) 05/16/2020 2302   TCO2 49 (H) 05/16/2020 2302   O2SAT 97.0 05/16/2020 2302     Coagulation Profile: Recent Labs  Lab 05/12/20 0716  INR 1.0    Cardiac Enzymes: No results for input(s): CKTOTAL, CKMB, CKMBINDEX, TROPONINI in the last 168 hours.  HbA1C: HbA1c, POC (prediabetic  range)  Date/Time Value Ref Range Status  02/11/2020 10:27 AM 5.8 5.7 - 6.4 % Final   Hgb A1c MFr Bld  Date/Time Value Ref Range Status  05/12/2020 12:47 PM 6.4 (H) 4.8 - 5.6 % Final    Comment:    (NOTE) Pre diabetes:          5.7%-6.4%  Diabetes:              >6.4%  Glycemic control  for   <7.0% adults with diabetes   08/17/2019 11:44 AM 6.2 (H) 4.8 - 5.6 % Final    Comment:             Prediabetes: 5.7 - 6.4          Diabetes: >6.4          Glycemic control for adults with diabetes: <7.0     CBG: Recent Labs  Lab 05/16/20 1543 05/16/20 1942 05/16/20 2328 05/17/20 0324 05/17/20 0819  GLUCAP 126* 113* 134* 110* 180*    CRITICAL CARE Performed by: Lynnell Catalan   Total critical care time: 40 minutes  Critical care time was exclusive of separately billable procedures and treating other patients.  Critical care was necessary to treat or prevent imminent or life-threatening deterioration.  Critical care was time spent personally by me on the following activities: development of treatment plan with patient and/or surrogate as well as nursing, discussions with consultants, evaluation of patient's response to treatment, examination of patient, obtaining history from patient or surrogate, ordering and performing treatments and interventions, ordering and review of laboratory studies, ordering and review of radiographic studies, pulse oximetry and re-evaluation of patient's condition.

## 2020-05-18 LAB — GLUCOSE, CAPILLARY
Glucose-Capillary: 124 mg/dL — ABNORMAL HIGH (ref 70–99)
Glucose-Capillary: 131 mg/dL — ABNORMAL HIGH (ref 70–99)
Glucose-Capillary: 131 mg/dL — ABNORMAL HIGH (ref 70–99)
Glucose-Capillary: 133 mg/dL — ABNORMAL HIGH (ref 70–99)
Glucose-Capillary: 146 mg/dL — ABNORMAL HIGH (ref 70–99)
Glucose-Capillary: 177 mg/dL — ABNORMAL HIGH (ref 70–99)

## 2020-05-18 LAB — POCT I-STAT 7, (LYTES, BLD GAS, ICA,H+H)
Acid-Base Excess: 12 mmol/L — ABNORMAL HIGH (ref 0.0–2.0)
Bicarbonate: 40.6 mmol/L — ABNORMAL HIGH (ref 20.0–28.0)
Calcium, Ion: 1.28 mmol/L (ref 1.15–1.40)
HCT: 46 % (ref 36.0–46.0)
Hemoglobin: 15.6 g/dL — ABNORMAL HIGH (ref 12.0–15.0)
O2 Saturation: 95 %
Patient temperature: 98.7
Potassium: 3.1 mmol/L — ABNORMAL LOW (ref 3.5–5.1)
Sodium: 141 mmol/L (ref 135–145)
TCO2: 43 mmol/L — ABNORMAL HIGH (ref 22–32)
pCO2 arterial: 69.9 mmHg (ref 32.0–48.0)
pH, Arterial: 7.372 (ref 7.350–7.450)
pO2, Arterial: 84 mmHg (ref 83.0–108.0)

## 2020-05-18 MED ORDER — POLYETHYLENE GLYCOL 3350 17 G PO PACK
17.0000 g | PACK | Freq: Two times a day (BID) | ORAL | Status: DC
  Administered 2020-05-18 – 2020-05-20 (×5): 17 g
  Filled 2020-05-18 (×5): qty 1

## 2020-05-18 MED ORDER — VITAL HIGH PROTEIN PO LIQD
1000.0000 mL | ORAL | Status: DC
  Administered 2020-05-18 – 2020-05-19 (×3): 1000 mL

## 2020-05-18 MED ORDER — SENNOSIDES 8.8 MG/5ML PO SYRP
5.0000 mL | ORAL_SOLUTION | Freq: Two times a day (BID) | ORAL | Status: DC
  Administered 2020-05-18 – 2020-05-20 (×5): 5 mL
  Filled 2020-05-18 (×5): qty 5

## 2020-05-18 MED ORDER — ACETAZOLAMIDE 250 MG PO TABS
250.0000 mg | ORAL_TABLET | Freq: Two times a day (BID) | ORAL | Status: AC
  Administered 2020-05-18 – 2020-05-19 (×3): 250 mg
  Filled 2020-05-18 (×3): qty 1

## 2020-05-18 NOTE — Progress Notes (Signed)
RT obtained ABG on pt with the following results and criticals. Critical PCO2 69.9 reported to [employer]. No changes at this time. RT will continue to monitor.   Results for SHAANA, ACOCELLA (MRN [id number]) as of 05/18/2020 04:21  Ref. Range 05/18/2020 04:10  Sample type Unknown ARTERIAL  pH, Arterial Latest Ref Range: 7.35 - 7.45  7.372  pCO2 arterial Latest Ref Range: 32 - 48 mmHg 69.9 (HH)  pO2, Arterial Latest Ref Range: 83 - 108 mmHg 84  TCO2 Latest Ref Range: 22 - 32 mmol/L 43 (H)  Acid-Base Excess Latest Ref Range: 0.0 - 2.0 mmol/L 12.0 (H)  Bicarbonate Latest Ref Range: 20.0 - 28.0 mmol/L 40.6 (H)  O2 Saturation Latest Units: % 95.0  Patient temperature Unknown 98.7 F  Collection site Unknown art line

## 2020-05-18 NOTE — Progress Notes (Signed)
Nutrition Follow-up  DOCUMENTATION CODES:   Morbid obesity  INTERVENTION:   Tube feeding via OG tube: Vital High Protein at 55 ml/h (1320 ml per day)  MVI with minerals daily  Provides 1320 kcal, 115 gm protein, 1103 ml free water daily  No BM, recommend monitor and provide additional bowel regimen medications as appropriate.   NUTRITION DIAGNOSIS:   Inadequate oral intake related to inability to eat as evidenced by NPO status. Ongoing  GOAL:   Provide needs based on ASPEN/SCCM guidelines Progressing  MONITOR:   TF tolerance  REASON FOR ASSESSMENT:   Consult, Ventilator Enteral/tube feeding initiation and management  ASSESSMENT:   Pt with PMH of OHS, OSA, class 3 obesity, asthma, and CHF admitted with multifactorial acute hypoxemic and hypercarbic respiratory failure.    Pt discussed during ICU rounds and with RN.  Per MD staying on vent today. Per RN pt off propofol.   Patient is currently intubated on ventilator support MV: 6.2 L/min Temp (24hrs), Avg:98.7 F (37.1 C), Min:98 F (36.7 C), Max:99.2 F (37.3 C) MAP: 78-99   Medications reviewed and include: SSI, solu-medrol, MVI with minerals, miralax, senokot  Fentanyl   Labs reviewed: K+ 3.4 CBG's: 126-177   Current TF:  Vital High Protein at 35 ml/h  Prosource TF 45 ml TID Provides 960 kcal, 106 gm protein  Diet Order:   Diet Order            Diet NPO time specified  Diet effective now                 EDUCATION NEEDS:   No education needs have been identified at this time  Skin:  Skin Assessment: Reviewed RN Assessment  Last BM:  unknown  Height:   Ht Readings from Last 1 Encounters:  05/16/20 5' (1.524 m)    Weight:   Wt Readings from Last 1 Encounters:  05/17/20 120.3 kg    Ideal Body Weight:  43.1 kg  BMI:  Body mass index is 51.8 kg/m.  Estimated Nutritional Needs:   Kcal:  1300  Protein:  90-110 grams  Fluid:  >1.5 L/day  Cammy Copa., RD, LDN, CNSC See  [facility] for contact information

## 2020-05-18 NOTE — Progress Notes (Signed)
NAME:  Veronica Castaneda, MRN:  [id number], DOB:  January 14, 1961, LOS: 6 ADMISSION DATE:  05/12/2020, CONSULTATION DATE:  05/12/20 REFERRING MD:  ER, CHIEF COMPLAINT:  SOB   Brief History   59 year old woman with hx of CHF presenting with 4 days worsening dyspnea arrived to ER in extremis and intubated.  History of present illness   59 year old woman with hx of CHF presenting with 4 days worsening dyspnea arrived to ER in extremis and intubated.  History per daughter at bedside with interpreter.  Had headaches, subjective fever, wheezing.  Has history of an unspecified lung disease that sounds like asthma that she gets frequent URIs with.  Was intubated last year with flu B and similar presentation.  PCCM asked to admit for acute hypoxemic and presumed hypercarbic resp failure.  Past Medical History  OHS OSA Class 3 obesity Asthma  Significant Hospital Events   8/19 admitted  Consults:  NA  Procedures:  8/19 ETT>  8/19 CVC> 8/19 Art line>   Significant Diagnostic Tests:  CXR- no infiltrates  Micro Data:  COVID neg  Antimicrobials:  Azithromycin 8/19  Interim history/subjective:   Tolerated brief period on pressure support. Return to full support due to persistent hypercarbia.  Objective   Blood pressure 115/68, pulse (!) 59, temperature 98 F (36.7 C), temperature source Axillary, resp. rate 10, height 5' (1.524 m), weight 120.3 kg, SpO2 92 %.    Vent Mode: PSV;CPAP FiO2 (%):  [50 %-60 %] 60 % Set Rate:  [16 bmp] 16 bmp Vt Set:  [320 mL] 320 mL PEEP:  [5 cmH20-8 cmH20] 8 cmH20 Pressure Support:  [5 cmH20-10 cmH20] 10 cmH20 Plateau Pressure:  [13 cmH20-20 cmH20] 16 cmH20   Intake/Output Summary (Last 24 hours) at 05/18/2020 1011 Last data filed at 05/18/2020 0930 Gross per 24 hour  Intake 1056.15 ml  Output 3850 ml  Net -2793.85 ml   Filed Weights   05/14/20 0500 05/16/20 0500 05/17/20 0423  Weight: 124.1 kg 125 kg 120.3 kg    Examination: General:  morbidly obese middle aged F, intubated sedated NAD  HENT: NCAT  ETT OGT secure anicteric sclera  Lungs: crackles have resolved. Wheezing has now cleared.. Has tolerated transition to PSV.  Cardiovascular: rrr s1s2 no rgm cap refill < 3 seconds  Abdomen: obese soft round ndnt   Extremities: BUE BLE non-pitting edema  Neuro: sedated PERRL. Awakens and follows commands  Skin: c/d/w intact   Resolved Hospital Problem list    Severe bronchospasm, resolved, s/p nebs, ketamine infusion   Assessment & Plan:   Acute hypoxemic respiratory failure requiring intubation mechanical ventilation Acute exacerbation of asthma Acute RSV infection Baseline likely has OHS and OSA Acute on chronic diastolic heart failure    PLAN Hold furosemide given worsening alkalosis. Continue acetazolamide to correct hypercarbia. Taper steroids. Continue nebs  SBT today with potential extubation to BiPAP - would like to see pCO2 in 50's prior to extubation.    Best practice:  Diet: EN - pause in anticipation of extubation.  Pain/Anxiety/Delirium protocol (if indicated): fentanyl, propofol - wean to off VAP protocol (if indicated): ordered DVT prophylaxis: Lovenox GI prophylaxis: Pepcid Glucose control: SSI Mobility: BR Code Status: full Family Communication: updated in [place].  Disposition: ICU  Labs   CBC: Recent Labs  Lab 05/12/20 0654 05/12/20 1945 05/13/20 0542 05/13/20 0542 05/14/20 0500 05/14/20 0500 05/15/20 0500 05/15/20 0500 05/16/20 0500 05/16/20 2302 05/17/20 0429 05/17/20 1230 05/18/20 0410  WBC 11.3*  --  10.6*  --  9.1  --  7.8  --  8.2  --  7.4  --   --   NEUTROABS 8.2*  --   --   --   --   --   --   --   --   --   --   --   --   HGB 14.1   < > 12.7   < > 12.3   < > 11.9*   < > 12.4 13.3 12.8 16.7* 15.6*  HCT 50.9*   < > 42.9   < > 42.3   < > 41.5   < > 42.4 39.0 43.3 49.0* 46.0  MCV 99.6  --  95.3  --  96.8  --  95.6  --  94.4  --  94.3  --   --   PLT 186  --  180  --   179  --  153  --  149*  --  149*  --   --    < > = values in this interval not displayed.    Basic Metabolic Panel: Recent Labs  Lab 05/13/20 0542 05/13/20 0542 05/13/20 1850 05/13/20 2252 05/14/20 0500 05/14/20 0500 05/14/20 1700 05/15/20 0500 05/15/20 0500 05/16/20 0500 05/16/20 2302 05/17/20 0429 05/17/20 1230 05/18/20 0410  NA 138   < >  --   --  144   < >  --  144   < > 143 138 145 142 141  K 3.4*   < >  --   --  4.5   < >  --  4.5   < > 3.6 3.9 3.7 3.9 3.1*  CL 99  --   --   --  103  --   --  101  --  97*  --  95*  --   --   CO2 33*  --   --   --  35*  --   --  37*  --  37*  --  38*  --   --   GLUCOSE 132*  --   --   --  132*  --   --  129*  --  106*  --  122*  --   --   BUN 9  --   --   --  22*  --   --  26*  --  31*  --  34*  --   --   CREATININE 0.50  --   --   --  0.61  --   --  0.61  --  0.67  --  0.61  --   --   CALCIUM 7.4*  --   --   --  8.3*  --   --  8.5*  --  8.4*  --  8.7*  --   --   MG  --   --  2.4 2.5* 2.5*  --  2.2  --   --   --   --   --   --   --   PHOS  --   --  2.1* 2.0* 2.7  --  2.8  --   --   --   --   --   --   --    < > = values in this interval not displayed.   GFR: Estimated Creatinine Clearance: 93.5 mL/min (by C-G formula based on SCr of 0.61 mg/dL). Recent Labs  Lab 05/12/20 0716 05/12/20 1233 05/12/20 1245  05/13/20 0542 05/14/20 0500 05/15/20 0500 05/16/20 0500 05/17/20 0429  PROCALCITON  --   --  <0.10  --   --   --   --   --   WBC  --   --   --    < > 9.1 7.8 8.2 7.4  LATICACIDVEN 0.6 2.9*  --   --   --   --   --   --    < > = values in this interval not displayed.    Liver Function Tests: Recent Labs  Lab 05/12/20 0654 05/13/20 0542  AST 32 21  ALT 24 16  ALKPHOS 122 95  BILITOT 0.7 0.5  PROT 7.5 5.9*  ALBUMIN 3.1* 2.3*   No results for input(s): LIPASE, AMYLASE in the last 168 hours. No results for input(s): AMMONIA in the last 168 hours.  ABG    Component Value Date/Time   PHART 7.372 05/18/2020 0410    PCO2ART 69.9 (HH) 05/18/2020 0410   PO2ART 84 05/18/2020 0410   HCO3 40.6 (H) 05/18/2020 0410   TCO2 43 (H) 05/18/2020 0410   O2SAT 95.0 05/18/2020 0410     Coagulation Profile: Recent Labs  Lab 05/12/20 0716  INR 1.0    Cardiac Enzymes: No results for input(s): CKTOTAL, CKMB, CKMBINDEX, TROPONINI in the last 168 hours.  HbA1C: HbA1c, POC (prediabetic range)  Date/Time Value Ref Range Status  02/11/2020 10:27 AM 5.8 5.7 - 6.4 % Final   Hgb A1c MFr Bld  Date/Time Value Ref Range Status  05/12/2020 12:47 PM 6.4 (H) 4.8 - 5.6 % Final    Comment:    (NOTE) Pre diabetes:          5.7%-6.4%  Diabetes:              >6.4%  Glycemic control for   <7.0% adults with diabetes   08/17/2019 11:44 AM 6.2 (H) 4.8 - 5.6 % Final    Comment:             Prediabetes: 5.7 - 6.4          Diabetes: >6.4          Glycemic control for adults with diabetes: <7.0     CBG: Recent Labs  Lab 05/17/20 1156 05/17/20 1945 05/17/20 2332 05/18/20 0325 05/18/20 0933  GLUCAP 165* 109* 126* 131* 177*    CRITICAL CARE Performed by: Lynnell Catalan   Total critical care time: 40 minutes  Critical care time was exclusive of separately billable procedures and treating other patients.  Critical care was necessary to treat or prevent imminent or life-threatening deterioration.  Critical care was time spent personally by me on the following activities: development of treatment plan with patient and/or surrogate as well as nursing, discussions with consultants, evaluation of patient's response to treatment, examination of patient, obtaining history from patient or surrogate, ordering and performing treatments and interventions, ordering and review of laboratory studies, ordering and review of radiographic studies, pulse oximetry and re-evaluation of patient's condition.

## 2020-05-19 LAB — POCT I-STAT 7, (LYTES, BLD GAS, ICA,H+H)
Acid-Base Excess: 8 mmol/L — ABNORMAL HIGH (ref 0.0–2.0)
Bicarbonate: 34.8 mmol/L — ABNORMAL HIGH (ref 20.0–28.0)
Calcium, Ion: 1.27 mmol/L (ref 1.15–1.40)
HCT: 45 % (ref 36.0–46.0)
Hemoglobin: 15.3 g/dL — ABNORMAL HIGH (ref 12.0–15.0)
O2 Saturation: 89 %
Patient temperature: 97.7
Potassium: 3.7 mmol/L (ref 3.5–5.1)
Sodium: 141 mmol/L (ref 135–145)
TCO2: 36 mmol/L — ABNORMAL HIGH (ref 22–32)
pCO2 arterial: 53.9 mmHg — ABNORMAL HIGH (ref 32.0–48.0)
pH, Arterial: 7.416 (ref 7.350–7.450)
pO2, Arterial: 56 mmHg — ABNORMAL LOW (ref 83.0–108.0)

## 2020-05-19 LAB — GLUCOSE, CAPILLARY
Glucose-Capillary: 114 mg/dL — ABNORMAL HIGH (ref 70–99)
Glucose-Capillary: 124 mg/dL — ABNORMAL HIGH (ref 70–99)
Glucose-Capillary: 182 mg/dL — ABNORMAL HIGH (ref 70–99)
Glucose-Capillary: 186 mg/dL — ABNORMAL HIGH (ref 70–99)
Glucose-Capillary: 174 mg/dL — ABNORMAL HIGH (ref 70–99)
Glucose-Capillary: 112 mg/dL — ABNORMAL HIGH (ref 70–99)
Glucose-Capillary: 117 mg/dL — ABNORMAL HIGH (ref 70–99)

## 2020-05-19 MED ORDER — METHYLPREDNISOLONE SODIUM SUCC 40 MG IJ SOLR
30.0000 mg | INTRAMUSCULAR | Status: AC
  Administered 2020-05-20 – 2020-05-22 (×3): 30 mg via INTRAVENOUS
  Filled 2020-05-19 (×3): qty 1

## 2020-05-19 MED ORDER — FENTANYL 2500MCG IN NS 250ML (10MCG/ML) PREMIX INFUSION
0.0000 ug/h | INTRAVENOUS | Status: DC
  Administered 2020-05-19: 50 ug/h via INTRAVENOUS
  Filled 2020-05-19: qty 250

## 2020-05-19 NOTE — Progress Notes (Signed)
NAME:  Veronica Castaneda, MRN:  [id number], DOB:  1961-07-28, LOS: 7 ADMISSION DATE:  05/12/2020, CONSULTATION DATE:  05/12/20 REFERRING MD:  ER, CHIEF COMPLAINT:  SOB   Brief History   59 year old woman with hx of CHF presenting with 4 days worsening dyspnea arrived to ER in extremis and intubated.  History of present illness   59 year old woman with hx of CHF presenting with 4 days worsening dyspnea arrived to ER in extremis and intubated.  History per daughter at bedside with interpreter.  Had headaches, subjective fever, wheezing.  Has history of an unspecified lung disease that sounds like asthma that she gets frequent URIs with.  Was intubated last year with flu B and similar presentation.  PCCM asked to admit for acute hypoxemic and presumed hypercarbic resp failure.  Past Medical History  OHS OSA Class 3 obesity Asthma  Significant Hospital Events   8/19 admitted  Consults:  NA  Procedures:  8/19 ETT>  8/19 CVC> 8/19 Art line>   Significant Diagnostic Tests:  CXR- no infiltrates  Micro Data:  COVID neg  Antimicrobials:  Azithromycin 8/19  Interim history/subjective:   No overnight events. ABG shows improved PCO2, marginal oxygenation.   Objective   Blood pressure (!) 145/76, pulse 73, temperature 99.4 F (37.4 C), temperature source Axillary, resp. rate 16, height 5' (1.524 m), weight 116.3 kg, SpO2 (!) 88 %.    Vent Mode: CPAP;PSV FiO2 (%):  [30 %-50 %] 40 % Set Rate:  [16 bmp] 16 bmp Vt Set:  [320 mL] 320 mL PEEP:  [8 cmH20] 8 cmH20 Pressure Support:  [10 cmH20] 10 cmH20 Plateau Pressure:  [11 cmH20-19 cmH20] 11 cmH20   Intake/Output Summary (Last 24 hours) at 05/19/2020 0842 Last data filed at 05/19/2020 0800 Gross per 24 hour  Intake 1171.65 ml  Output 1800 ml  Net -628.35 ml   Filed Weights   05/16/20 0500 05/17/20 0423 05/19/20 0400  Weight: 125 kg 120.3 kg 116.3 kg    Examination: General: morbidly obese middle aged F, intubated  sedated NAD  HENT: NCAT  ETT OGT secure anicteric sclera  Lungs: crackles have resolved. Rhonchi. Has tolerated transition to PSV.  Cardiovascular: rrr s1s2 no rgm cap refill < 3 seconds  Abdomen: obese soft round ndnt   Extremities: BUE BLE non-pitting edema  Neuro: sedated PERRL. Awakens and follows commands  Skin: c/d/w intact   Resolved Hospital Problem list    Severe bronchospasm, resolved, s/p nebs, ketamine infusion   Assessment & Plan:   Acute hypoxemic respiratory failure requiring intubation mechanical ventilation Acute exacerbation of asthma Acute RSV infection Baseline likely has OHS and OSA Acute on chronic diastolic heart failure    PLAN Holding furosemide given worsening alkalosis again today. Continue acetazolamide to correct hypercarbia. Consider starting maintenance torsemide tomorrow.  Taper steroids further. Continue nebs  SBT today with potential extubation to BiPAP today.    Best practice:  Diet: EN - pause in anticipation of extubation.  Pain/Anxiety/Delirium protocol (if indicated): fentanyl, propofol - wean to off VAP protocol (if indicated): ordered DVT prophylaxis: Lovenox GI prophylaxis: Pepcid Glucose control: SSI Mobility: BR Code Status: full Family Communication: updated in [place].  Disposition: ICU  Labs   CBC: Recent Labs  Lab 05/13/20 0542 05/13/20 0542 05/14/20 0500 05/14/20 0500 05/15/20 0500 05/15/20 0500 05/16/20 0500 05/16/20 0500 05/16/20 2302 05/17/20 0429 05/17/20 1230 05/18/20 0410 05/19/20 0811  WBC 10.6*  --  9.1  --  7.8  --  8.2  --   --  7.4  --   --   --   HGB 12.7   < > 12.3   < > 11.9*   < > 12.4   < > 13.3 12.8 16.7* 15.6* 15.3*  HCT 42.9   < > 42.3   < > 41.5   < > 42.4   < > 39.0 43.3 49.0* 46.0 45.0  MCV 95.3  --  96.8  --  95.6  --  94.4  --   --  94.3  --   --   --   PLT 180  --  179  --  153  --  149*  --   --  149*  --   --   --    < > = values in this interval not displayed.    Basic  Metabolic Panel: Recent Labs  Lab 05/13/20 0542 05/13/20 0542 05/13/20 1850 05/13/20 2252 05/14/20 0500 05/14/20 0500 05/14/20 1700 05/15/20 0500 05/15/20 0500 05/16/20 0500 05/16/20 0500 05/16/20 2302 05/17/20 0429 05/17/20 1230 05/18/20 0410 05/19/20 0811  NA 138   < >  --   --  144   < >  --  144   < > 143   < > 138 145 142 141 141  K 3.4*   < >  --   --  4.5   < >  --  4.5   < > 3.6   < > 3.9 3.7 3.9 3.1* 3.7  CL 99  --   --   --  103  --   --  101  --  97*  --   --  95*  --   --   --   CO2 33*  --   --   --  35*  --   --  37*  --  37*  --   --  38*  --   --   --   GLUCOSE 132*  --   --   --  132*  --   --  129*  --  106*  --   --  122*  --   --   --   BUN 9  --   --   --  22*  --   --  26*  --  31*  --   --  34*  --   --   --   CREATININE 0.50  --   --   --  0.61  --   --  0.61  --  0.67  --   --  0.61  --   --   --   CALCIUM 7.4*  --   --   --  8.3*  --   --  8.5*  --  8.4*  --   --  8.7*  --   --   --   MG  --   --  2.4 2.5* 2.5*  --  2.2  --   --   --   --   --   --   --   --   --   PHOS  --   --  2.1* 2.0* 2.7  --  2.8  --   --   --   --   --   --   --   --   --    < > = values in this interval not displayed.   GFR: Estimated Creatinine Clearance:  91.5 mL/min (by C-G formula based on SCr of 0.61 mg/dL). Recent Labs  Lab 05/12/20 1233 05/12/20 1245 05/13/20 0542 05/14/20 0500 05/15/20 0500 05/16/20 0500 05/17/20 0429  PROCALCITON  --  <0.10  --   --   --   --   --   WBC  --   --    < > 9.1 7.8 8.2 7.4  LATICACIDVEN 2.9*  --   --   --   --   --   --    < > = values in this interval not displayed.    Liver Function Tests: Recent Labs  Lab 05/13/20 0542  AST 21  ALT 16  ALKPHOS 95  BILITOT 0.5  PROT 5.9*  ALBUMIN 2.3*   No results for input(s): LIPASE, AMYLASE in the last 168 hours. No results for input(s): AMMONIA in the last 168 hours.  ABG    Component Value Date/Time   PHART 7.416 05/19/2020 0811   PCO2ART 53.9 (H) 05/19/2020 0811   PO2ART  56 (L) 05/19/2020 0811   HCO3 34.8 (H) 05/19/2020 0811   TCO2 36 (H) 05/19/2020 0811   O2SAT 89.0 05/19/2020 0811     Coagulation Profile: No results for input(s): INR, PROTIME in the last 168 hours.  Cardiac Enzymes: No results for input(s): CKTOTAL, CKMB, CKMBINDEX, TROPONINI in the last 168 hours.  HbA1C: HbA1c, POC (prediabetic range)  Date/Time Value Ref Range Status  02/11/2020 10:27 AM 5.8 5.7 - 6.4 % Final   Hgb A1c MFr Bld  Date/Time Value Ref Range Status  05/12/2020 12:47 PM 6.4 (H) 4.8 - 5.6 % Final    Comment:    (NOTE) Pre diabetes:          5.7%-6.4%  Diabetes:              >6.4%  Glycemic control for   <7.0% adults with diabetes   08/17/2019 11:44 AM 6.2 (H) 4.8 - 5.6 % Final    Comment:             Prediabetes: 5.7 - 6.4          Diabetes: >6.4          Glycemic control for adults with diabetes: <7.0     CBG: Recent Labs  Lab 05/18/20 1554 05/18/20 1953 05/18/20 2331 05/19/20 0340 05/19/20 0820  GLUCAP 131* 124* 133* 124* 182*    CRITICAL CARE Performed by: Lynnell Catalan   Total critical care time: 40 minutes  Critical care time was exclusive of separately billable procedures and treating other patients.  Critical care was necessary to treat or prevent imminent or life-threatening deterioration.  Critical care was time spent personally by me on the following activities: development of treatment plan with patient and/or surrogate as well as nursing, discussions with consultants, evaluation of patient's response to treatment, examination of patient, obtaining history from patient or surrogate, ordering and performing treatments and interventions, ordering and review of laboratory studies, ordering and review of radiographic studies, pulse oximetry and re-evaluation of patient's condition.    Lynnell Catalan, MD [facility] ICU Physician [facility] [place] [facility]  Pager: [phone] Mobile: [phone] After hours: [phone].  05/19/2020,  8:46 AM

## 2020-05-20 LAB — BASIC METABOLIC PANEL
Anion gap: 8 (ref 5–15)
BUN: 36 mg/dL — ABNORMAL HIGH (ref 6–20)
CO2: 30 mmol/L (ref 22–32)
Calcium: 9.4 mg/dL (ref 8.9–10.3)
Chloride: 107 mmol/L (ref 98–111)
Creatinine, Ser: 0.62 mg/dL (ref 0.44–1.00)
GFR calc Af Amer: >60 mL/min (ref >60)
GFR calc non Af Amer: >60 mL/min (ref >60)
Glucose, Bld: 141 mg/dL — ABNORMAL HIGH (ref 70–99)
Potassium: 3.3 mmol/L — ABNORMAL LOW (ref 3.5–5.1)
Sodium: 145 mmol/L (ref 135–145)

## 2020-05-20 LAB — POCT I-STAT 7, (LYTES, BLD GAS, ICA,H+H)
Acid-Base Excess: 4 mmol/L — ABNORMAL HIGH (ref 0.0–2.0)
Bicarbonate: 29.9 mmol/L — ABNORMAL HIGH (ref 20.0–28.0)
Calcium, Ion: 1.24 mmol/L (ref 1.15–1.40)
HCT: 42 % (ref 36.0–46.0)
Hemoglobin: 14.3 g/dL (ref 12.0–15.0)
O2 Saturation: 96 %
Patient temperature: 100.6
Potassium: 4.2 mmol/L (ref 3.5–5.1)
Sodium: 146 mmol/L — ABNORMAL HIGH (ref 135–145)
TCO2: 31 mmol/L (ref 22–32)
pCO2 arterial: 49.2 mmHg — ABNORMAL HIGH (ref 32.0–48.0)
pH, Arterial: 7.396 (ref 7.350–7.450)
pO2, Arterial: 87 mmHg (ref 83.0–108.0)

## 2020-05-20 LAB — GLUCOSE, CAPILLARY
Glucose-Capillary: 114 mg/dL — ABNORMAL HIGH (ref 70–99)
Glucose-Capillary: 168 mg/dL — ABNORMAL HIGH (ref 70–99)
Glucose-Capillary: 145 mg/dL — ABNORMAL HIGH (ref 70–99)
Glucose-Capillary: 102 mg/dL — ABNORMAL HIGH (ref 70–99)
Glucose-Capillary: 112 mg/dL — ABNORMAL HIGH (ref 70–99)
Glucose-Capillary: 103 mg/dL — ABNORMAL HIGH (ref 70–99)

## 2020-05-20 MED ORDER — POTASSIUM CHLORIDE 20 MEQ/15ML (10%) PO SOLN
20.0000 meq | ORAL | Status: AC
  Administered 2020-05-20 (×2): 20 meq
  Filled 2020-05-20 (×2): qty 15

## 2020-05-20 MED ORDER — SODIUM CHLORIDE 0.9 % IV SOLN
INTRAVENOUS | Status: DC | PRN
  Administered 2020-05-20: 500 mL via INTRAVENOUS

## 2020-05-20 MED ORDER — POTASSIUM CHLORIDE 20 MEQ/15ML (10%) PO SOLN
40.0000 meq | Freq: Once | ORAL | Status: AC
  Administered 2020-05-20: 40 meq
  Filled 2020-05-20: qty 30

## 2020-05-20 NOTE — Progress Notes (Signed)
NAME:  Veronica Castaneda, MRN:  [id number], DOB:  [birth year], LOS: 8 ADMISSION DATE:  05/12/2020, CONSULTATION DATE:  05/12/20 REFERRING MD:  ER, CHIEF COMPLAINT:  SOB   Brief History   59 year old woman with hx of CHF presenting with 4 days worsening dyspnea arrived to ER in extremis and intubated.  History of present illness   59 year old woman with hx of CHF presenting with 4 days worsening dyspnea arrived to ER in extremis and intubated.  History per daughter at bedside with interpreter.  Had headaches, subjective fever, wheezing.  Has history of an unspecified lung disease that sounds like asthma that she gets frequent URIs with.  Was intubated last year with flu B and similar presentation.  PCCM asked to admit for acute hypoxemic and presumed hypercarbic resp failure.  Past Medical History  OHS OSA Class 3 obesity Asthma  Significant Hospital Events   8/19 admitted  Consults:  NA  Procedures:  8/19 ETT>  8/19 CVC> 8/19 Art line>   Significant Diagnostic Tests:  CXR- no infiltrates  Micro Data:  COVID neg  Antimicrobials:  Azithromycin 8/19off  Interim history/subjective:  Awake and follows commands. Will assess for possible extubation     Objective   Blood pressure (!) 141/93, pulse 65, temperature 98.7 F (37.1 C), temperature source Axillary, resp. rate 20, height 5' (1.524 m), weight 116.2 kg, SpO2 92 %.    Vent Mode: CPAP;PSV FiO2 (%):  [50 %] 50 % Set Rate:  [16 bmp] 16 bmp Vt Set:  [320 mL] 320 mL PEEP:  [8 cmH20] 8 cmH20 Pressure Support:  [10 cmH20] 10 cmH20 Plateau Pressure:  [15 cmH20-16 cmH20] 15 cmH20   Intake/Output Summary (Last 24 hours) at 05/20/2020 1118 Last data filed at 05/20/2020 0600 Gross per 24 hour  Intake 782.91 ml  Output 2175 ml  Net -1392.09 ml   Filed Weights   05/17/20 0423 05/19/20 0400 05/20/20 0500  Weight: 120.3 kg 116.3 kg 116.2 kg    Examination: General: Morbidly obese non-English speaking female no  acute distress HEENT: Endotracheal tube in place Neuro: Grossly intact follows commands CV: Heart sounds are regular PULM: Diminished in the bases Vent pressure regulated volume control FIO2 40  PEEP 8 RATE 16 VT 8 cc/kg  GI: soft, bsx4 active  GU: Extremities: warm/dry,  edema  Skin: no rashes or lesions   Resolved Hospital Problem list    Severe bronchospasm, resolved, s/p nebs, ketamine infusion   Assessment & Plan:   Acute hypoxemic respiratory failure requiring intubation mechanical ventilation Acute exacerbation of asthma Acute RSV infection Baseline likely has OHS and OSA Acute on chronic diastolic heart failure    PLAN  Check abg and dc aline Wean as tolerated Steroids taper Nebs as needed Weight loss      Best practice:  Diet: EN - pause in anticipation of extubation.  Pain/Anxiety/Delirium protocol (if indicated): fentanyl, propofol - wean to off VAP protocol (if indicated): ordered DVT prophylaxis: Lovenox GI prophylaxis: Pepcid Glucose control: SSI Mobility: BR Code Status: full Family Communication: 05/20/2020 husband at bedside on Disposition: ICU  Labs   CBC: Recent Labs  Lab 05/14/20 0500 05/14/20 0500 05/15/20 0500 05/15/20 0500 05/16/20 0500 05/16/20 0500 05/16/20 2302 05/17/20 0429 05/17/20 1230 05/18/20 0410 05/19/20 0811  WBC 9.1  --  7.8  --  8.2  --   --  7.4  --   --   --   HGB 12.3   < > 11.9*   < >  12.4   < > 13.3 12.8 16.7* 15.6* 15.3*  HCT 42.3   < > 41.5   < > 42.4   < > 39.0 43.3 49.0* 46.0 45.0  MCV 96.8  --  95.6  --  94.4  --   --  94.3  --   --   --   PLT 179  --  153  --  149*  --   --  149*  --   --   --    < > = values in this interval not displayed.    Basic Metabolic Panel: Recent Labs  Lab 05/13/20 1850 05/13/20 2252 05/14/20 0500 05/14/20 0500 05/14/20 1700 05/15/20 0500 05/15/20 0500 05/16/20 0500 05/16/20 2302 05/17/20 0429 05/17/20 1230 05/18/20 0410 05/19/20 0811 05/20/20 0523  NA   --   --  144   < >  --  144   < > 143   < > 145 142 141 141 145  K  --   --  4.5   < >  --  4.5   < > 3.6   < > 3.7 3.9 3.1* 3.7 3.3*  CL  --   --  103  --   --  101  --  97*  --  95*  --   --   --  107  CO2  --   --  35*  --   --  37*  --  37*  --  38*  --   --   --  30  GLUCOSE  --   --  132*  --   --  129*  --  106*  --  122*  --   --   --  141*  BUN  --   --  22*  --   --  26*  --  31*  --  34*  --   --   --  36*  CREATININE  --   --  0.61  --   --  0.61  --  0.67  --  0.61  --   --   --  0.62  CALCIUM  --   --  8.3*  --   --  8.5*  --  8.4*  --  8.7*  --   --   --  9.4  MG 2.4 2.5* 2.5*  --  2.2  --   --   --   --   --   --   --   --   --   PHOS 2.1* 2.0* 2.7  --  2.8  --   --   --   --   --   --   --   --   --    < > = values in this interval not displayed.   GFR: Estimated Creatinine Clearance: 91.5 mL/min (by C-G formula based on SCr of 0.62 mg/dL). Recent Labs  Lab 05/14/20 0500 05/15/20 0500 05/16/20 0500 05/17/20 0429  WBC 9.1 7.8 8.2 7.4    Liver Function Tests: No results for input(s): AST, ALT, ALKPHOS, BILITOT, PROT, ALBUMIN in the last 168 hours. No results for input(s): LIPASE, AMYLASE in the last 168 hours. No results for input(s): AMMONIA in the last 168 hours.  ABG    Component Value Date/Time   PHART 7.416 05/19/2020 0811   PCO2ART 53.9 (H) 05/19/2020 0811   PO2ART 56 (L) 05/19/2020 0811   HCO3 34.8 (H) 05/19/2020 0811   TCO2  36 (H) 05/19/2020 0811   O2SAT 89.0 05/19/2020 0811     Coagulation Profile: No results for input(s): INR, PROTIME in the last 168 hours.  Cardiac Enzymes: No results for input(s): CKTOTAL, CKMB, CKMBINDEX, TROPONINI in the last 168 hours.  HbA1C: HbA1c, POC (prediabetic range)  Date/Time Value Ref Range Status  02/11/2020 10:27 AM 5.8 5.7 - 6.4 % Final   Hgb A1c MFr Bld  Date/Time Value Ref Range Status  05/12/2020 12:47 PM 6.4 (H) 4.8 - 5.6 % Final    Comment:    (NOTE) Pre diabetes:          5.7%-6.4%  Diabetes:               >6.4%  Glycemic control for   <7.0% adults with diabetes   08/17/2019 11:44 AM 6.2 (H) 4.8 - 5.6 % Final    Comment:             Prediabetes: 5.7 - 6.4          Diabetes: >6.4          Glycemic control for adults with diabetes: <7.0     CBG: Recent Labs  Lab 05/19/20 1526 05/19/20 1926 05/19/20 2320 05/20/20 0327 05/20/20 0817  GLUCAP 174* 112* 117* 114* 168*    App cct 30 min  Brett Canales Ionia Schey ACNP Acute Care Nurse Practitioner [facility] Please consult Amion 05/20/2020, 11:19 AM

## 2020-05-20 NOTE — Procedures (Signed)
Extubation Procedure Note  Patient Details:   Name: Veronica Castaneda DOB: 1960/10/06 MRN: [id number]   Airway Documentation:    Vent end date: 05/20/20 Vent end time: 1655   Evaluation  O2 sats: stable throughout Complications: No apparent complications Patient did tolerate procedure well. Bilateral Breath Sounds: Diminished   Yes  RT extubated patient to 5L [place] with RN at bedside per MD order. Slight cuff leak noted. Per MD continue with extubation. Patient tolerated well and no stridor noted at this time. RT will continue to monitor as needed.  Jaquelyn Bitter 05/20/2020, 5:57 PM

## 2020-05-20 NOTE — Plan of Care (Signed)
Patient extubated to 5L nasal cannula. Capable of coughing and clearing her own secretions with suction yankauer.

## 2020-05-21 ENCOUNTER — Inpatient Hospital Stay (HOSPITAL_COMMUNITY): Payer: Self-pay

## 2020-05-21 ENCOUNTER — Encounter (HOSPITAL_COMMUNITY): Payer: Self-pay | Admitting: Internal Medicine

## 2020-05-21 LAB — PHOSPHORUS: Phosphorus: 3.5 mg/dL (ref 2.5–4.6)

## 2020-05-21 LAB — BASIC METABOLIC PANEL
Anion gap: 10 (ref 5–15)
BUN: 31 mg/dL — ABNORMAL HIGH (ref 6–20)
CO2: 25 mmol/L (ref 22–32)
Calcium: 9.1 mg/dL (ref 8.9–10.3)
Chloride: 110 mmol/L (ref 98–111)
Creatinine, Ser: 0.6 mg/dL (ref 0.44–1.00)
GFR calc Af Amer: >60 mL/min (ref >60)
GFR calc non Af Amer: >60 mL/min (ref >60)
Glucose, Bld: 103 mg/dL — ABNORMAL HIGH (ref 70–99)
Potassium: 3.5 mmol/L (ref 3.5–5.1)
Sodium: 145 mmol/L (ref 135–145)

## 2020-05-21 LAB — MAGNESIUM: Magnesium: 2.3 mg/dL (ref 1.7–2.4)

## 2020-05-21 LAB — GLUCOSE, CAPILLARY
Glucose-Capillary: 107 mg/dL — ABNORMAL HIGH (ref 70–99)
Glucose-Capillary: 131 mg/dL — ABNORMAL HIGH (ref 70–99)
Glucose-Capillary: 157 mg/dL — ABNORMAL HIGH (ref 70–99)

## 2020-05-21 MED ORDER — SENNA 8.6 MG PO TABS: 1.0000 | ORAL_TABLET | Freq: Two times a day (BID) | ORAL | Status: DC

## 2020-05-21 MED ORDER — FAMOTIDINE 20 MG PO TABS
20.0000 mg | ORAL_TABLET | Freq: Two times a day (BID) | ORAL | Status: DC
  Administered 2020-05-21: 20 mg via ORAL
  Filled 2020-05-21: qty 1

## 2020-05-21 MED ORDER — ADULT MULTIVITAMIN W/MINERALS CH
1.0000 | ORAL_TABLET | Freq: Every day | ORAL | Status: DC
  Administered 2020-05-21: 1 via ORAL
  Filled 2020-05-21: qty 1

## 2020-05-21 MED ORDER — DOCUSATE SODIUM 50 MG/5ML PO LIQD: 100.0000 mg | Freq: Two times a day (BID) | ORAL | Status: DC | PRN

## 2020-05-21 MED ORDER — POLYETHYLENE GLYCOL 3350 17 G PO PACK: 17.0000 g | PACK | Freq: Every day | ORAL | Status: DC | PRN

## 2020-05-21 MED ORDER — POLYETHYLENE GLYCOL 3350 17 G PO PACK: 17.0000 g | PACK | Freq: Two times a day (BID) | ORAL | Status: DC

## 2020-05-21 MED ORDER — FUROSEMIDE 20 MG PO TABS
20.0000 mg | ORAL_TABLET | Freq: Every day | ORAL | Status: DC
  Administered 2020-05-22 – 2020-05-24 (×3): 20 mg via ORAL
  Filled 2020-05-21 (×3): qty 1

## 2020-05-21 NOTE — Evaluation (Signed)
Physical Therapy Evaluation Patient Details Name: Veronica Castaneda MRN: [id number] DOB: [birth year] Today's Date: 05/21/2020   History of Present Illness  59 year old woman with hx of CHF presenting with 4 days worsening dyspnea arrived to ER in extremis and intubated. Per daughter the pt Had headaches, subjective fever, wheezing.  Has history of an unspecified lung disease that sounds like asthma that she gets frequent URIs with.  Was intubated last year with flu B and similar presentation. Pt extubated on 05/20/2020.  Clinical Impression  Pt presents to PT with deficits in functional mobility, gait, balance, endurance, strength, power. Pt requires physical assistance to perform all functional mobility at this time with one posterior LOB durign this session. Pt's activity tolerance is limited at this time as she fatigues quickly. Pt will benefit from continued acute PT services to improve mobility quality and activity tolerance while reducing falls risk. PT recommends home health PT and assistance from family for all mobility, however the pt will require further acute PT services prior to discharge in order to reach this level.    Follow Up Recommendations Home health PT;Supervision for mobility/OOB    Equipment Recommendations  3in1 (PT)    Recommendations for Other Services       Precautions / Restrictions Precautions Precautions: Fall Precaution Comments: monitor SpO2 Restrictions Weight Bearing Restrictions: No      Mobility  Bed Mobility Overal bed mobility: Needs Assistance Bed Mobility: Supine to Sit;Rolling Rolling: Supervision   Supine to sit: Mod assist        Transfers Overall transfer level: Needs assistance Equipment used: Rolling walker (2 wheeled);1 person hand held assist Transfers: Sit to/from Stand Sit to Stand: Min guard;Min assist         General transfer comment: minA without device, minG with RW  Ambulation/Gait Ambulation/Gait  assistance: Min assist Gait Distance (Feet): 5 Feet (5' forward and backward) Assistive device: Rolling walker (2 wheeled) Gait Pattern/deviations: Step-to pattern;Wide base of support Gait velocity: reduced Gait velocity interpretation: <1.31 ft/sec, indicative of household ambulator General Gait Details: pt with short step to gait with widened BOS. minA for one loss of balance with backward steps  Stairs            Wheelchair Mobility    Modified Rankin (Stroke Patients Only)       Balance Overall balance assessment: Needs assistance Sitting-balance support: No upper extremity supported;Feet supported Sitting balance-Leahy Scale: Fair     Standing balance support: Bilateral upper extremity supported Standing balance-Leahy Scale: Poor Standing balance comment: reliant on UE support of RW                             Pertinent Vitals/Pain Pain Assessment: No/denies pain    Home Living Family/patient expects to be discharged to:: Private residence Living Arrangements: Children;Spouse/significant other Available Help at Discharge: Family;Available 24 hours/day Type of Home: House Home Access: Level entry (pt reports level entry, 1 level, but house is not flat)     Home Layout: One level Home Equipment: Walker - 2 wheels      Prior Function Level of Independence: Independent         Comments: pt reports utilizing a RW about a year ago but has not used an assistive device since     Hand Dominance   Dominant Hand: Right    Extremity/Trunk Assessment   Upper Extremity Assessment Upper Extremity Assessment: Generalized weakness    Lower Extremity  Assessment Lower Extremity Assessment: Generalized weakness    Cervical / Trunk Assessment Cervical / Trunk Assessment: Other exceptions Cervical / Trunk Exceptions: morbid obesity  Communication   Communication: Prefers language other than English  Cognition Arousal/Alertness:  Awake/alert Behavior During Therapy: WFL for tasks assessed/performed Overall Cognitive Status: Within Functional Limits for tasks assessed                                        General Comments General comments (skin integrity, edema, etc.): VSS, pt on 4L [place]    Exercises     Assessment/Plan    PT Assessment Patient needs continued PT services  PT Problem List Decreased strength;Decreased activity tolerance;Decreased balance;Decreased mobility;Decreased knowledge of use of DME;Decreased safety awareness;Decreased knowledge of precautions;Cardiopulmonary status limiting activity       PT Treatment Interventions DME instruction;Gait training;Stair training;Functional mobility training;Therapeutic activities;Therapeutic exercise;Balance training;Neuromuscular re-education;Patient/family education    PT Goals (Current goals can be found in the Care Plan section)  Acute Rehab PT Goals Patient Stated Goal: To improve walking and activity tolerance PT Goal Formulation: With patient/family Time For Goal Achievement: 06/04/20 Potential to Achieve Goals: Good    Frequency Min 3X/week   Barriers to discharge        Co-evaluation               AM-PAC PT "6 Clicks" Mobility  Outcome Measure Help needed turning from your back to your side while in a flat bed without using bedrails?: None Help needed moving from lying on your back to sitting on the side of a flat bed without using bedrails?: A Lot Help needed moving to and from a bed to a chair (including a wheelchair)?: A Little Help needed standing up from a chair using your arms (e.g., wheelchair or bedside chair)?: A Little Help needed to walk in hospital room?: A Little Help needed climbing 3-5 steps with a railing? : A Lot 6 Click Score: 17    End of Session Equipment Utilized During Treatment: Oxygen Activity Tolerance: Patient tolerated treatment well Patient left: in chair;with call bell/phone within  reach;with family/visitor present Nurse Communication: Mobility status PT Visit Diagnosis: Other abnormalities of gait and mobility (R26.89);Muscle weakness (generalized) (M62.81);Unsteadiness on feet (R26.81)    Time: 6767-2094 PT Time Calculation (min) (ACUTE ONLY): 40 min   Charges:   PT Evaluation $PT Eval Moderate Complexity: 1 Mod PT Treatments $Gait Training: 8-22 mins        Arlyss Gandy, PT, DPT Acute Rehabilitation Pager: [phone]   Arlyss Gandy 05/21/2020, 5:01 PM

## 2020-05-21 NOTE — Progress Notes (Signed)
RT note.  Pt. Placed on auto titrate cpap mode 20/5. Pt. Resting comfortable .  RT will continue to monitor.

## 2020-05-21 NOTE — Progress Notes (Signed)
NAME:  Veronica Castaneda, MRN:  [id number], DOB:  Dec 26, 1960, LOS: 9 ADMISSION DATE:  05/12/2020, CONSULTATION DATE:  05/12/20 REFERRING MD:  ER, CHIEF COMPLAINT:  SOB   Brief History   59 year old woman with hx of CHF presenting with 4 days worsening dyspnea arrived to ER in extremis and intubated.  History of present illness   59 year old woman with hx of CHF presenting with 4 days worsening dyspnea arrived to ER in extremis and intubated.  History per daughter at bedside with interpreter.  Had headaches, subjective fever, wheezing.  Has history of an unspecified lung disease that sounds like asthma that she gets frequent URIs with.  Was intubated last year with flu B and similar presentation.  PCCM asked to admit for acute hypoxemic and presumed hypercarbic resp failure.  Past Medical History  OHS OSA Class 3 obesity Asthma  Significant Hospital Events   8/19 admitted 8/26 extubated.   Consults:  NA  Procedures:  8/19 ETT>  8/19 CVC> 8/19 Art line>   Significant Diagnostic Tests:  CXR- no infiltrates  Micro Data:  COVID neg  Antimicrobials:  Azithromycin 8/19off  Interim history/subjective:  Successfully extubated yesterday. No distress or complaints (interviewed in Spanish)  Objective   Blood pressure 129/76, pulse (!) 52, temperature 99.4 F (37.4 C), temperature source Oral, resp. rate (!) 21, height 5' (1.524 m), weight 118 kg, SpO2 94 %.    SpO2: 94 % O2 Flow Rate (L/min): 4 L/min FiO2 (%): 36 %    Intake/Output Summary (Last 24 hours) at 05/21/2020 0843 Last data filed at 05/21/2020 0600 Gross per 24 hour  Intake 1009.77 ml  Output 1075 ml  Net -65.23 ml   Filed Weights   05/19/20 0400 05/20/20 0500 05/21/20 0500  Weight: 116.3 kg 116.2 kg 118 kg    Examination: General: Morbidly obese non-English speaking female no acute distress HEENT: no skin breakdown  Neuro: Grossly intact follows commands CV: Heart sounds are regular PULM:  Clear GI: soft, bsx4 active  GU: external catheter  Extremities: warm/dry, no edema  Skin: no rashes or lesions   Resolved Hospital Problem list    Severe bronchospasm, resolved, s/p nebs, ketamine infusion   Assessment & Plan:   Was critically ill due to acute hypoxemic respiratory failure requiring intubation mechanical ventilation Acute exacerbation of asthma Acute RSV infection Baseline likely has OHS and OSA Acute on chronic diastolic heart failure   PLAN  Appears to be at respiratory baseline.  Transfer to floor. Progressive ambulation Maintenance diuretics Set up home bronchodilator regimen Continue BiPAP at night, will need home machine and PSG after discharge  Best practice:  Diet: SLP and advance diet. Pain/Anxiety/Delirium protocol (if indicated):none VAP protocol (if indicated): progressive extubation.  DVT prophylaxis: Lovenox GI prophylaxis: Not indicated.  Glucose control: SSI Mobility: ad lib Code Status: full Family Communication: 05/20/2020 husband at bedside on Disposition:transfer to med-surg. Orders reconciled and [facility] notified.   Labs   CBC: Recent Labs  Lab 05/15/20 0500 05/15/20 0500 05/16/20 0500 05/16/20 2302 05/17/20 0429 05/17/20 1230 05/18/20 0410 05/19/20 0811 05/20/20 1226  WBC 7.8  --  8.2  --  7.4  --   --   --   --   HGB 11.9*   < > 12.4   < > 12.8 16.7* 15.6* 15.3* 14.3  HCT 41.5   < > 42.4   < > 43.3 49.0* 46.0 45.0 42.0  MCV 95.6  --  94.4  --  94.3  --   --   --   --   PLT 153  --  149*  --  149*  --   --   --   --    < > = values in this interval not displayed.    Basic Metabolic Panel: Recent Labs  Lab 05/14/20 1700 05/15/20 0500 05/15/20 0500 05/16/20 0500 05/16/20 2302 05/17/20 0429 05/17/20 1230 05/18/20 0410 05/19/20 0811 05/20/20 0523 05/20/20 1226 05/21/20 0345  NA  --  144   < > 143   < > 145   < > 141 141 145 146* 145  K  --  4.5   < > 3.6   < > 3.7   < > 3.1* 3.7 3.3* 4.2 3.5  CL  --  101  --   97*  --  95*  --   --   --  107  --  110  CO2  --  37*  --  37*  --  38*  --   --   --  30  --  25  GLUCOSE  --  129*  --  106*  --  122*  --   --   --  141*  --  103*  BUN  --  26*  --  31*  --  34*  --   --   --  36*  --  31*  CREATININE  --  0.61  --  0.67  --  0.61  --   --   --  0.62  --  0.60  CALCIUM  --  8.5*  --  8.4*  --  8.7*  --   --   --  9.4  --  9.1  MG 2.2  --   --   --   --   --   --   --   --   --   --  2.3  PHOS 2.8  --   --   --   --   --   --   --   --   --   --  3.5   < > = values in this interval not displayed.   GFR: Estimated Creatinine Clearance: 92.3 mL/min (by C-G formula based on SCr of 0.6 mg/dL). Recent Labs  Lab 05/15/20 0500 05/16/20 0500 05/17/20 0429  WBC 7.8 8.2 7.4    Liver Function Tests: No results for input(s): AST, ALT, ALKPHOS, BILITOT, PROT, ALBUMIN in the last 168 hours. No results for input(s): LIPASE, AMYLASE in the last 168 hours. No results for input(s): AMMONIA in the last 168 hours.  ABG    Component Value Date/Time   PHART 7.396 05/20/2020 1226   PCO2ART 49.2 (H) 05/20/2020 1226   PO2ART 87 05/20/2020 1226   HCO3 29.9 (H) 05/20/2020 1226   TCO2 31 05/20/2020 1226   O2SAT 96.0 05/20/2020 1226     Coagulation Profile: No results for input(s): INR, PROTIME in the last 168 hours.  Cardiac Enzymes: No results for input(s): CKTOTAL, CKMB, CKMBINDEX, TROPONINI in the last 168 hours.  HbA1C: HbA1c, POC (prediabetic range)  Date/Time Value Ref Range Status  02/11/2020 10:27 AM 5.8 5.7 - 6.4 % Final   Hgb A1c MFr Bld  Date/Time Value Ref Range Status  05/12/2020 12:47 PM 6.4 (H) 4.8 - 5.6 % Final    Comment:    (NOTE) Pre diabetes:          5.7%-6.4%  Diabetes:              >  6.4%  Glycemic control for   <7.0% adults with diabetes   08/17/2019 11:44 AM 6.2 (H) 4.8 - 5.6 % Final    Comment:             Prediabetes: 5.7 - 6.4          Diabetes: >6.4          Glycemic control for adults with diabetes: <7.0      CBG: Recent Labs  Lab 05/20/20 1527 05/20/20 1937 05/20/20 2314 05/21/20 0325 05/21/20 0812  GLUCAP 102* 112* 103* 107* 131*   35 min spent with >50% in counseling and coordination of care.  Lynnell Catalan, MD [facility] ICU Physician [facility]  Pager: [phone] Mobile: [phone] After hours: [phone].  05/21/2020, 8:51 AM

## 2020-05-21 NOTE — Evaluation (Signed)
Clinical/Bedside Swallow Evaluation Patient Details  Name: Veronica Castaneda MRN: [id number] Date of Birth: 02/03/1961  Today's Date: 05/21/2020 Time: SLP Start Time (ACUTE ONLY): 0840 SLP Stop Time (ACUTE ONLY): 0900 SLP Time Calculation (min) (ACUTE ONLY): 20 min  Past Medical History:  Past Medical History:  Diagnosis Date  . Hyperlipidemia    Past Surgical History:  Past Surgical History:  Procedure Laterality Date  . No PAST SURGICAL HISTORY     HPI:  Patient is a 59 y.o. female with PMH: CHF, presenting to hospital with 4 days worsening dyspnea. She was intubated in ER upon arrival. Patient with h/o unscpecified lung disease that per MD, sounds like it is asthma and she gets frequent URI's with it. She was intubated last year with flu and similar presentation as current admission. She was admitted with hypoxemic and presumed hypercarbic respiratory failure. She was extubated on 8/17 to 5 liters O2 via nasal cannula.   Assessment / Plan / Recommendation Clinical Impression  Patient presents with a mild oropharyngeal dysphagia characterized by mildly decreased mastication and oral manipulation of regular solids but without overt s/s of aspiration or penetration with any of tested PO's (puree, thin liquid, regular solids). Patient is currently on 5 Liters O2 via nasal cannula. Her respiratory rate remained at baseline 22-25, SpO2 at 91% and HR remained 77-80. Plan to initiate her on Dys 3, thin liquids diet and with likely upgrade if she is tolerating this without difficulty. SLP Visit Diagnosis: Dysphagia, unspecified (R13.10)    Aspiration Risk  Mild aspiration risk    Diet Recommendation Thin liquid;Dysphagia 3 (Mech soft)   Liquid Administration via: Straw Medication Administration: Whole meds with liquid Supervision: Patient able to self feed Compensations: Slow rate;Small sips/bites;Other (Comment) (take breaks if getting fatigued or change in respirations)     Other  Recommendations Oral Care Recommendations: Oral care BID   Follow up Recommendations None      Frequency and Duration min 1 x/week  1 week       Prognosis Prognosis for Safe Diet Advancement: Good      Swallow Study   General Date of Onset: 05/12/20 HPI: Patient is a 59 y.o. female with PMH: CHF, presenting to hospital with 4 days worsening dyspnea. She was intubated in ER upon arrival. Patient with h/o unscpecified lung disease that per MD, sounds like it is asthma and she gets frequent URI's with it. She was intubated last year with flu and similar presentation as current admission. She was admitted with hypoxemic and presumed hypercarbic respiratory failure. She was extubated on 8/17 to 5 liters O2 via nasal cannula. Type of Study: Bedside Swallow Evaluation Previous Swallow Assessment: N/A Diet Prior to this Study: NPO Temperature Spikes Noted: No Respiratory Status: Nasal cannula History of Recent Intubation: Yes Length of Intubations (days): 9 days Date extubated: 05/20/20 Behavior/Cognition: Alert;Pleasant mood;Cooperative Oral Cavity Assessment: Within Functional Limits Oral Care Completed by SLP: Yes Oral Cavity - Dentition: Adequate natural dentition Vision: Functional for self-feeding Self-Feeding Abilities: Able to feed self Patient Positioning: Upright in bed Baseline Vocal Quality: Hoarse (mildly hoarse) Volitional Cough: Strong Volitional Swallow: Able to elicit    Oral/Motor/Sensory Function     Ice Chips     Thin Liquid Thin Liquid: Within functional limits Presentation: Straw Other Comments: No overt s/s of aspiration or penetration and no change in vitals    Nectar Thick     Honey Thick     Puree Puree: Within functional limits Presentation: Spoon  Solid     Solid: Impaired Oral Phase Functional Implications: Impaired mastication;Prolonged oral transit Other Comments: Mildly prolonged mastication and oral transit but no overt s/s of  aspiration or penetration      Angela Nevin, MA, CCC-SLP Speech Therapy [facility] Acute Rehab

## 2020-05-21 NOTE — Progress Notes (Signed)
Patient transferred to 5N at this time. Report given to [facility].   Patient and husband educated on transfer using interpreter phone [phone] with good understanding verbalized. No belongings noted in room. VSS, spo2 100% on 4L [place].

## 2020-05-21 NOTE — Progress Notes (Signed)
Patient transferred from bed to chair with 2 person assist and RW without difficulty. 4L O2 via [place] in place. Denies dyspnea/SOB with mobility. Resting comfortably in chair at this time. Husband at bedside. Call bell within reach.

## 2020-05-22 DIAGNOSIS — T884XXA Failed or difficult intubation, initial encounter: Secondary | ICD-10-CM | POA: Diagnosis present

## 2020-05-22 DIAGNOSIS — G4733 Obstructive sleep apnea (adult) (pediatric): Secondary | ICD-10-CM | POA: Diagnosis present

## 2020-05-22 DIAGNOSIS — B338 Other specified viral diseases: Secondary | ICD-10-CM | POA: Diagnosis present

## 2020-05-22 DIAGNOSIS — E662 Morbid (severe) obesity with alveolar hypoventilation: Secondary | ICD-10-CM | POA: Diagnosis present

## 2020-05-22 DIAGNOSIS — J45901 Unspecified asthma with (acute) exacerbation: Secondary | ICD-10-CM | POA: Diagnosis present

## 2020-05-22 NOTE — Progress Notes (Signed)
PROGRESS NOTE  Veronica Castaneda ZHG:[id number] DOB: 10/06/1960 DOA: 05/12/2020 PCP: Marcine Matar, MD  Brief History   59 year old woman with hx of CHF presenting with 4 days worsening dyspnea arrived to ER in extremis and intubated.  History per daughter at bedside with interpreter.  Had headaches, subjective fever, wheezing.  Has history of an unspecified lung disease that sounds like asthma that she gets frequent URIs with.  Was intubated last year with flu B and similar presentation.  PCCM asked to admit for acute hypoxemic and presumed hypercarbic resp failure.  Consultants  . PCCM  Procedures  . Mechanical ventilation  Antibiotics   Anti-infectives (From admission, onward)   Start     Dose/Rate Route Frequency Ordered Stop   05/12/20 1000  azithromycin (ZITHROMAX) 500 mg in sodium chloride 0.9 % 250 mL IVPB        500 mg 250 mL/hr over 60 Minutes Intravenous Every 24 hours 05/12/20 0924 05/14/20 1155   05/12/20 1000  cefTRIAXone (ROCEPHIN) 2 g in sodium chloride 0.9 % 100 mL IVPB  Status:  Discontinued        2 g 200 mL/hr over 30 Minutes Intravenous Every 24 hours 05/12/20 0939 05/14/20 1013    .  Subjective  The patient is sitting up at bedside eating breakfast. No new complaints.  Objective   Vitals:  Vitals:   05/22/20 1230 05/22/20 1550  BP:  (!) 145/67  Pulse:  65  Resp:  16  Temp:    SpO2: 95% 93%   Exam:  Constitutional:  . The patient is awake, alert, and oriented x 3. No acute distress. Respiratory:  . No increased work of breathing. . No wheezes, rales, or rhonchi . No tactile fremitus Cardiovascular:  . Regular rate and rhythm . No murmurs, ectopy, or gallups. . No lateral PMI. No thrills. Abdomen:  . Abdomen is soft, non-tender, non-distended . No hernias, masses, or organomegaly . Normoactive bowel sounds.  Musculoskeletal:  . No cyanosis, clubbing, or edema Skin:  . No rashes, lesions, ulcers . palpation of skin: no  induration or nodules Neurologic:  . CN 2-12 intact . Sensation all 4 extremities intact Psychiatric:  . Mental status o Mood, affect appropriate o Orientation to person, place, time  . judgment and insight appear intact  I have personally reviewed the following:   Today's Data  . Vitals, BMP, Glucoses  Imaging  . CXR  Scheduled Meds: . arformoterol  15 mcg Nebulization BID  . budesonide (PULMICORT) nebulizer solution  0.5 mg Nebulization BID  . Chlorhexidine Gluconate Cloth  6 each Topical Daily  . enoxaparin (LOVENOX) injection  60 mg Subcutaneous Daily  . furosemide  20 mg Oral Daily  . revefenacin  175 mcg Nebulization Daily   Continuous Infusions:  Problem  Asthma Exacerbation  Morbid Obesity With Body Mass Index (Bmi) of 50.0 to 59.9 in Adult (Hcc)  Obesity Hypoventilation Syndrome (Hcc)  Osa (Obstructive Sleep Apnea)  Rsv (Respiratory Syncytial Virus Infection)  Difficult Airway for Intubation  Acute Respiratory Failure With Hypoxia (Hcc)  Morbid Obesity (Hcc)     A & P  Acute hypoxic respiratory failure/Difficult airway for intubation: Pt extubated on 05/20/2020. She is currently requiring 4L O2 to maintain saturations in the 90's. She did not require O2 at home prior to admission  Infection with Respiratory Syncytial Virus: Noted. This is the cause for the acute exacerbation of asthma.  Acute exacerbation of asthma: The patient has received IV sterodis,  pulmicort, brovana, and revefenacin. No wheezing on exam.  OSA/OHS/Daytime sleepiness: PCCM hs recommended that the patient go home with BIPAP. Will consult TOC in the morning.  Morbid Obesity: Complicates all aspects of care. Recommend that the patient seek sensible weight loss with the assistance of her PCP in the morning.   I have seen and examined this patient myself. I have spent 35 minutes in her evaluation and care.  DVT Prophylaxis: Lovenox CODE STATUS: Full Code Family Communication: None  present Disposition:  Status is: Inpatient  Remains inpatient appropriate because:Inpatient level of care appropriate due to severity of illness   Dispo: The patient is from: Home              Anticipated d/c is to: tbd              Anticipated d/c date is: 2 days              Patient currently is not medically stable to d/c.  Sathvik Tiedt, DO [facility] Direct contact: see [email or web address]  7PM-7AM contact night coverage as above 05/22/2020, 6:18 PM  LOS: 10 days    LOS: 10 days

## 2020-05-22 NOTE — Plan of Care (Signed)
  Problem: Clinical Measurements: Goal: Respiratory complications will improve Outcome: Progressing   Problem: Activity: Goal: Risk for activity intolerance will decrease Outcome: Progressing   

## 2020-05-22 NOTE — Progress Notes (Signed)
RT note.pt refused BIPAP tonight. Pt. On 4L [place] sating 94% resting comfortable. Pt did say she will try again tomorrow night. RT will continue to monitor.

## 2020-05-23 MED ORDER — ADULT MULTIVITAMIN W/MINERALS CH
1.0000 | ORAL_TABLET | Freq: Every day | ORAL | Status: DC
  Administered 2020-05-23 – 2020-05-24 (×2): 1 via ORAL
  Filled 2020-05-23 (×2): qty 1

## 2020-05-23 NOTE — Progress Notes (Addendum)
SATURATION QUALIFICATIONS: (This note is used to comply with regulatory documentation for home oxygen)  Patient Saturations on Room Air at Rest = 95%  Patient Saturations on Room Air while Ambulating = 88-90%  Patient Saturations on 2 Liters of oxygen while Ambulating = 95%

## 2020-05-23 NOTE — Progress Notes (Signed)
Nutrition Follow-up  DOCUMENTATION CODES:   Morbid obesity  INTERVENTION:   -Magic cup TID with meals, each supplement provides 290 kcal and 9 grams of protein -MVI with minerals daily  NUTRITION DIAGNOSIS:   Inadequate oral intake related to inability to eat as evidenced by NPO status.  Progressing; advanced to dysphagia 3 diet with thin liquids  GOAL:   Patient will meet greater than or equal to 90% of their needs  Progressing   MONITOR:   PO intake, Supplement acceptance, Diet advancement, Labs, Weight trends, Skin, I & O's  REASON FOR ASSESSMENT:   Consult, Ventilator Enteral/tube feeding initiation and management  ASSESSMENT:   Pt with PMH of OHS, OSA, class 3 obesity, asthma, and CHF admitted with multifactorial acute hypoxemic and hypercarbic respiratory failure.  8/27- extubated 8/28- s/p BSE- advanced to dysphagia 3 diet with thin liquids  Reviewed I/O's: +880 ml x 24 hours and -6.5 L since admission  UOP: 200 ml x 24 hours  Pt with good oral intake; noted meal completion 100%. Per SLP notes, pt may be eligible for diet upgrade soon. Pt would benefit from addition of oral nutritional supplements.   Medications reviewed and include lasix.   Labs reviewed: CBGS: 107-131.   Diet Order:   Diet Order            DIET DYS 3 Room service appropriate? Yes with Assist; Fluid consistency: Thin  Diet effective now                 EDUCATION NEEDS:   No education needs have been identified at this time  Skin:  Skin Assessment: Reviewed RN Assessment  Last BM:  05/22/20  Height:   Ht Readings from Last 1 Encounters:  05/16/20 5' (1.524 m)    Weight:   Wt Readings from Last 1 Encounters:  05/21/20 118 kg    Ideal Body Weight:  43.1 kg  BMI:  Body mass index is 50.81 kg/m.  Estimated Nutritional Needs:   Kcal:  1800-2000  Protein:  100-115 grams  Fluid:  > 1.8 L    Levada Schilling, RD, LDN, CDCES Registered Dietitian II Certified  Diabetes Care and Education Specialist Please refer to [facility] for RD and/or RD on-call/weekend/after hours pager

## 2020-05-23 NOTE — Evaluation (Signed)
Occupational Therapy Evaluation Patient Details Name: Veronica Castaneda MRN: [id number] DOB: 1961/06/02 Today's Date: 05/23/2020    History of Present Illness 59 year old woman with hx of CHF presenting with 4 days worsening dyspnea arrived to ER in extremis and intubated. Per daughter the pt Had headaches, subjective fever, wheezing.  Has history of an unspecified lung disease that sounds like asthma that she gets frequent URIs with.  Was intubated last year with flu B and similar presentation. Pt extubated on 05/20/2020.   Clinical Impression   Patient found in bed and ? Removed O2.  Patient presents with generalized weakness, decreased balance, decreased aerobic capacity (de-sats with activity) and mild declines to LB ADL compared to stated PLOF.  PLOF: patient was essentially Ind with all ADL, home management and meal prep.  She did not drive or use O2 at home.  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  She states she could arrange 24 hour SBA/assist as needed when d/c'd.  Currently she requires an AD for mobility, Min A for LB dressing and SBA for stand grooming and toilet skills.  OT recommends HH OT if the patient agrees and assist as needed via family.      Follow Up Recommendations  Home health OT    Equipment Recommendations  Tub/shower seat    Recommendations for Other Services  HH OT and assist as needed via family.       Precautions / Restrictions Precautions Precautions: Fall Precaution Comments: monitor SpO2 Restrictions Weight Bearing Restrictions: No      Mobility Bed Mobility Overal bed mobility: Needs Assistance Bed Mobility: Supine to Sit Rolling: Modified independent (Device/Increase time)   Supine to sit: Min assist        Transfers   Equipment used: Rolling walker (2 wheeled) Transfers: Sit to/from [employer] Sit to Stand: Supervision;Min guard Stand pivot transfers: Supervision       General transfer comment: 2WRW     Balance   Sitting-balance support: No upper extremity supported;Feet supported Sitting balance-Leahy Scale: Good     Standing balance support: Bilateral upper extremity supported Standing balance-Leahy Scale: Fair Standing balance comment: reliant on UE support of RW                           ADL either performed or assessed with clinical judgement   ADL Overall ADL's : Needs assistance/impaired Eating/Feeding: Independent   Grooming: Wash/dry hands;Wash/dry face;Supervision/safety       Lower Body Bathing: Minimal assistance;Set up Lower Body Bathing Details (indicate cue type and reason): Increased time and effort Upper Body Dressing : Set up   Lower Body Dressing: Set up;Minimal assistance Lower Body Dressing Details (indicate cue type and reason): Increased time and effort.  Patient with SOB and de sats w/o O2. Toilet Transfer: Modified Stage manager and Hygiene: Independent       Functional mobility during ADLs: Supervision/safety;Rolling walker       Vision Baseline Vision/History: No visual deficits       Perception     Praxis      Pertinent Vitals/Pain Pain Assessment: No/denies pain     Hand Dominance Right   Extremity/Trunk Assessment Upper Extremity Assessment Upper Extremity Assessment: Overall WFL for tasks assessed   Lower Extremity Assessment Lower Extremity Assessment: Defer to PT evaluation   Cervical / Trunk Assessment Cervical / Trunk Exceptions: morbid obesity   Communication Communication Communication: Interpreter utilized  Cognition Arousal/Alertness: Awake/alert Behavior During Therapy: WFL for tasks assessed/performed Overall Cognitive Status: Within Functional Limits for tasks assessed                                     General Comments  Patient found w/o O2 this am.  O2 sats ranging from 80 to 85% on RA.  O2 placed at 1 L via [place] and rebounded to 90%     Exercises     Shoulder Instructions      Home Living Family/patient expects to be discharged to:: Private residence Living Arrangements: Spouse/significant other Available Help at Discharge: Family;Available 24 hours/day Type of Home: House Home Access: Stairs to enter [employer] of Steps: 3 small steps per patient.   Home Layout: One level     Bathroom Shower/Tub: Chief Strategy Officer: Standard     Home Equipment: Walker - 2 wheels          Prior Functioning/Environment Level of Independence: Independent        Comments: pt reports utilizing a RW about a year ago but has not used an assistive device since        OT Problem List:  Impaired balance, Obesity, and Decreased Aerobic capacity.        OT Treatment/Interventions:      OT Goals(Current goals can be found in the care plan section)    OT Frequency:     Barriers to D/C:  O2 sat levels.            Co-evaluation              AM-PAC OT "6 Clicks" Daily Activity     Outcome Measure Help from another person eating meals?: None Help from another person taking care of personal grooming?: None Help from another person toileting, which includes using toliet, bedpan, or urinal?: None Help from another person bathing (including washing, rinsing, drying)?: A Little Help from another person to put on and taking off regular upper body clothing?: A Little Help from another person to put on and taking off regular lower body clothing?: A Lot 6 Click Score: 20   End of Session Equipment Utilized During Treatment: Gait belt;Rolling walker Nurse Communication: Other (comment) (patient ? removed O2)  Activity Tolerance: Patient limited by fatigue Patient left: in bed;with call bell/phone within reach;with family/visitor present  OT Visit Diagnosis: Unsteadiness on feet (R26.81);Muscle weakness (generalized) (M62.81);Other (comment) (Decreased aerobic capacity)                Time:  6010-9323 OT Time Calculation (min): 20 min Charges:  OT Evaluation $OT Eval Moderate Complexity: 1 Mod  Veronica Castaneda, OTR/L 05/23/2020, 9:05 AM

## 2020-05-23 NOTE — Progress Notes (Signed)
Physical Therapy Treatment Patient Details Name: Veronica Castaneda MRN: [id number] DOB: 08-06-1961 Today's Date: 05/23/2020    History of Present Illness 59 year old woman with hx of CHF presenting with 4 days worsening dyspnea arrived to ER in extremis and intubated. Per daughter the pt Had headaches, subjective fever, wheezing.  Has history of an unspecified lung disease that sounds like asthma that she gets frequent URIs with.  Was intubated last year with flu B and similar presentation. Pt extubated on 05/20/2020.    PT Comments    Pt with much improved OOB mobilization this day, ambulating hallway distance with use of RW and intermittent cuing for form and safety. Pt required x1 seated rest break during mobility, SpO2 maintained 90% and greater during all mobility on RA. Pt husband present in room, very supportive. Will continue to follow acutely.  Interpreter utilized via amnion services: Veronica Castaneda   Follow Up Recommendations  Home health PT;Supervision for mobility/OOB     Equipment Recommendations  3in1 (PT)    Recommendations for Other Services       Precautions / Restrictions Precautions Precautions: Fall Precaution Comments: monitor SpO2 Restrictions Weight Bearing Restrictions: No    Mobility  Bed Mobility Overal bed mobility: Needs Assistance Bed Mobility: Supine to Sit Rolling: Modified independent (Device/Increase time)   Supine to sit: Min assist     General bed mobility comments: up in chair upon PT arrival to room  Transfers Overall transfer level: Needs assistance Equipment used: Rolling walker (2 wheeled) Transfers: Sit to/from Stand Sit to Stand: Min guard Stand pivot transfers: Supervision       General transfer comment: for safety, increased time to rise. Verbal cuing for correct hand placement. Sit<>stand x2, from recliner and from chair in hallway  Ambulation/Gait Ambulation/Gait assistance: Min assist;Min guard Gait Distance  (Feet): 75 Feet (2x75 ft) Assistive device: Rolling walker (2 wheeled);1 person hand held assist Gait Pattern/deviations: Step-through pattern;Decreased stride length;Trunk flexed Gait velocity: decr   General Gait Details: Min guard for safety with use of RW, min assist to steady when trialled x10 ft ambulation without AD and HHA only. SpO2 90-91% on RA during ambulation. DOE 2/4.   Stairs             Wheelchair Mobility    Modified Rankin (Stroke Patients Only)       Balance Overall balance assessment: Needs assistance Sitting-balance support: No upper extremity supported;Feet supported Sitting balance-Leahy Scale: Good     Standing balance support: Single extremity supported Standing balance-Leahy Scale: Fair Standing balance comment: able to ambulate with HHA with steadying assist, more comfortable with use of RW at this time                            Cognition Arousal/Alertness: Awake/alert Behavior During Therapy: WFL for tasks assessed/performed Overall Cognitive Status: Within Functional Limits for tasks assessed                                        Exercises      General Comments General comments (skin integrity, edema, etc.): Patient found w/o O2 this am.  O2 sats ranging from 80 to 85% on RA.  O2 placed at 1 L via [place] and rebounded to 90%      Pertinent Vitals/Pain Pain Assessment: Faces Faces Pain Scale: Hurts a little bit Pain  Location: back Pain Descriptors / Indicators: Discomfort Pain Intervention(s): Limited activity within patient's tolerance;Monitored during session;Repositioned    Home Living Family/patient expects to be discharged to:: Private residence Living Arrangements: Spouse/significant other Available Help at Discharge: Family;Available 24 hours/day Type of Home: House Home Access: Stairs to enter   Home Layout: One level Home Equipment: Environmental consultant - 2 wheels      Prior Function Level of  Independence: Independent      Comments: pt reports utilizing a RW about a year ago but has not used an assistive device since   PT Goals (current goals can now be found in the care plan section) Acute Rehab PT Goals Patient Stated Goal: To improve walking and activity tolerance PT Goal Formulation: With patient/family Time For Goal Achievement: 06/04/20 Potential to Achieve Goals: Good Progress towards PT goals: Progressing toward goals    Frequency    Min 3X/week      PT Plan Current plan remains appropriate    Co-evaluation              AM-PAC PT "6 Clicks" Mobility   Outcome Measure  Help needed turning from your back to your side while in a flat bed without using bedrails?: None Help needed moving from lying on your back to sitting on the side of a flat bed without using bedrails?: A Little Help needed moving to and from a bed to a chair (including a wheelchair)?: A Little Help needed standing up from a chair using your arms (e.g., wheelchair or bedside chair)?: A Little Help needed to walk in hospital room?: A Little Help needed climbing 3-5 steps with a railing? : A Little 6 Click Score: 19    End of Session Equipment Utilized During Treatment: Oxygen Activity Tolerance: Patient tolerated treatment well Patient left: in chair;with call bell/phone within reach;with family/visitor present Nurse Communication: Mobility status PT Visit Diagnosis: Other abnormalities of gait and mobility (R26.89);Muscle weakness (generalized) (M62.81);Unsteadiness on feet (R26.81)     Time: 7619-5093 PT Time Calculation (min) (ACUTE ONLY): 24 min  Charges:  $Gait Training: 8-22 mins $Therapeutic Activity: 8-22 mins                    Veronica Castaneda E, PT Acute Rehabilitation Services Pager [phone]  Office [phone]   Veronica Castaneda Veronica Castaneda Veronica Castaneda 05/23/2020, 12:06 PM

## 2020-05-23 NOTE — Plan of Care (Signed)
  Problem: Clinical Measurements: Goal: Respiratory complications will improve Outcome: Progressing   Problem: Activity: Goal: Risk for activity intolerance will decrease Outcome: Progressing   Problem: Safety: Goal: Ability to remain free from injury will improve Outcome: Progressing   

## 2020-05-23 NOTE — Progress Notes (Signed)
PROGRESS NOTE  Veronica Castaneda WUJ:[id number] DOB: 11-17-1960 DOA: 05/12/2020 PCP: Marcine Matar, MD  Brief History   59 year old woman with hx of CHF presenting with 4 days worsening dyspnea arrived to ER in extremis and intubated.  History per daughter at bedside with interpreter.  Had headaches, subjective fever, wheezing.  Has history of an unspecified lung disease that sounds like asthma that she gets frequent URIs with.  Was intubated last year with flu B and similar presentation.  PCCM asked to admit for acute hypoxemic and presumed hypercarbic resp failure.  Consultants  . PCCM  Procedures  . Mechanical ventilation  Antibiotics   Anti-infectives (From admission, onward)   Start     Dose/Rate Route Frequency Ordered Stop   05/12/20 1000  azithromycin (ZITHROMAX) 500 mg in sodium chloride 0.9 % 250 mL IVPB        500 mg 250 mL/hr over 60 Minutes Intravenous Every 24 hours 05/12/20 0924 05/14/20 1155   05/12/20 1000  cefTRIAXone (ROCEPHIN) 2 g in sodium chloride 0.9 % 100 mL IVPB  Status:  Discontinued        2 g 200 mL/hr over 30 Minutes Intravenous Every 24 hours 05/12/20 0939 05/14/20 1013     Subjective  The patient is sitting up in a chair at bedside. No new complaints.  Objective   Vitals:  Vitals:   05/23/20 1035 05/23/20 1327  BP:  106/77  Pulse:  78  Resp:  18  Temp:  98.6 F (37 C)  SpO2: 90% 93%   Exam:  Constitutional:  . The patient is awake, alert, and oriented x 3. No acute distress. Respiratory:  . No increased work of breathing. . No wheezes, rales, or rhonchi . No tactile fremitus Cardiovascular:  . Regular rate and rhythm . No murmurs, ectopy, or gallups. . No lateral PMI. No thrills. Abdomen:  . Abdomen is soft, non-tender, non-distended . No hernias, masses, or organomegaly . Normoactive bowel sounds.  Musculoskeletal:  . No cyanosis, clubbing, or edema Skin:  . No rashes, lesions, ulcers . palpation of skin: no  induration or nodules Neurologic:  . CN 2-12 intact . Sensation all 4 extremities intact Psychiatric:  . Mental status o Mood, affect appropriate o Orientation to person, place, time  . judgment and insight appear intact  I have personally reviewed the following:   Today's Data  . Vitals  Imaging  . CXR  Scheduled Meds: . arformoterol  15 mcg Nebulization BID  . budesonide (PULMICORT) nebulizer solution  0.5 mg Nebulization BID  . Chlorhexidine Gluconate Cloth  6 each Topical Daily  . enoxaparin (LOVENOX) injection  60 mg Subcutaneous Daily  . furosemide  20 mg Oral Daily  . multivitamin with minerals  1 tablet Oral Daily  . revefenacin  175 mcg Nebulization Daily   Continuous Infusions:  No problems updated.   A & P  Acute hypoxic respiratory failure/Difficult airway for intubation: Pt extubated on 05/20/2020. She is currently requiring 4L O2 to maintain saturations in the 90's. She did not require O2 at home prior to admission. Today she was able to ambulate on room air with saturations in the low nineties.  Infection with Respiratory Syncytial Virus: Noted. This is the cause for the acute exacerbation of asthma.  Acute exacerbation of asthma: The patient has received IV sterodis, pulmicort, brovana, and revefenacin. No wheezing on exam.  OSA/OHS/Daytime sleepiness: PCCM hs recommended that the patient go home with BIPAP. Will consult TOC in  the morning.  Morbid Obesity: Complicates all aspects of care. Recommend that the patient seek sensible weight loss with the assistance of her PCP in the morning.   I have seen and examined this patient myself. I have spent 32 minutes in her evaluation and care.  DVT Prophylaxis: Lovenox CODE STATUS: Full Code Family Communication: None present Disposition:  Status is: Inpatient  Remains inpatient appropriate because:Inpatient level of care appropriate due to severity of illness   Dispo: The patient is from: Home               Anticipated d/c is to: tbd              Anticipated d/c date is: 1 day              Patient currently is not medically stable to d/c.  [name], DO [facility] Direct contact: see [email or web address]  7PM-7AM contact night coverage as above 05/23/2020, 5:04 PM  LOS: 10 days    LOS: 11 days

## 2020-05-24 MED ORDER — ALBUTEROL SULFATE HFA 108 (90 BASE) MCG/ACT IN AERS: 2.0000 | INHALATION_SPRAY | RESPIRATORY_TRACT | 2 refills | Status: DC | PRN

## 2020-05-24 MED ORDER — ARFORMOTEROL TARTRATE 15 MCG/2ML IN NEBU: 15.0000 ug | INHALATION_SOLUTION | Freq: Two times a day (BID) | RESPIRATORY_TRACT | 0 refills | Status: DC

## 2020-05-24 MED ORDER — ALBUTEROL SULFATE (2.5 MG/3ML) 0.083% IN NEBU: 2.5000 mg | INHALATION_SOLUTION | Freq: Four times a day (QID) | RESPIRATORY_TRACT | 2 refills | Status: DC

## 2020-05-24 MED ORDER — TRELEGY ELLIPTA 100-62.5-25 MCG/INH IN AEPB: 1.0000 | INHALATION_SPRAY | Freq: Every day | RESPIRATORY_TRACT | 0 refills | Status: DC

## 2020-05-24 MED ORDER — FUROSEMIDE 20 MG PO TABS: 20.0000 mg | ORAL_TABLET | Freq: Every day | ORAL | 2 refills | Status: DC

## 2020-05-24 MED ORDER — BUDESONIDE 0.5 MG/2ML IN SUSP: 0.5000 mg | Freq: Two times a day (BID) | RESPIRATORY_TRACT | 0 refills | Status: DC

## 2020-05-24 MED ORDER — POLYETHYLENE GLYCOL 3350 17 G PO PACK
17.0000 g | PACK | Freq: Every day | ORAL | 0 refills | Status: DC
Start: 2020-05-24 — End: 2020-05-24

## 2020-05-24 MED ORDER — POLYETHYLENE GLYCOL 3350 17 G PO PACK: 17.0000 g | PACK | Freq: Every day | ORAL | 0 refills | Status: DC

## 2020-05-24 MED ORDER — REVEFENACIN 175 MCG/3ML IN SOLN: 175.0000 ug | Freq: Every day | RESPIRATORY_TRACT | 0 refills | Status: DC

## 2020-05-24 MED ORDER — ALBUTEROL SULFATE HFA 108 (90 BASE) MCG/ACT IN AERS: 2.0000 | INHALATION_SPRAY | Freq: Four times a day (QID) | RESPIRATORY_TRACT | 2 refills | Status: DC

## 2020-05-24 MED ORDER — ADULT MULTIVITAMIN W/MINERALS CH: 1.0000 | ORAL_TABLET | Freq: Every day | ORAL | 0 refills | Status: DC

## 2020-05-24 MED ORDER — SPIRIVA HANDIHALER 18 MCG IN CAPS: 18.0000 ug | ORAL_CAPSULE | Freq: Every day | RESPIRATORY_TRACT | 0 refills | Status: DC

## 2020-05-24 MED FILL — TRELEGY ELLIPTA 100-62.5-25: 100-62.5-25 | 30 days supply | Qty: 60 | Fill #0

## 2020-05-24 MED FILL — ALBUTEROL SULFATE HFA 108 (: 108 (90 BAS | 25 days supply | Qty: 18 | Fill #0

## 2020-05-24 MED FILL — POLYETHYLENE GLYCOL 3350 PO: 17 | 14 days supply | Qty: 238 | Fill #0

## 2020-05-24 MED FILL — ALBUTEROL SUL 2.5 MG/3 ML S: (2.5 MG/3ML | 9 days supply | Qty: 90 | Fill #0

## 2020-05-24 NOTE — Progress Notes (Signed)
Physical Therapy Treatment Patient Details Name: Veronica Castaneda MRN: [id number] DOB: May 20, 1961 Today's Date: 05/24/2020    History of Present Illness 59 year old woman with hx of CHF presenting with 4 days worsening dyspnea arrived to ER in extremis and intubated. Per daughter the pt Had headaches, subjective fever, wheezing.  Has history of an unspecified lung disease that sounds like asthma that she gets frequent URIs with.  Was intubated last year with flu B and similar presentation. Pt extubated on 05/20/2020.    PT Comments    Pt was seen for mobility of gait and transfers, and noted her instability requiring RW.  Fatigued during the walk but kept the chair close after practicing stairs.  Pt is prepared to get into her home with husband, and will recommend home therapy follow up for her ongoing residual LE weakness.   Follow Up Recommendations  Home health PT;Supervision for mobility/OOB     Equipment Recommendations  3in1 (PT)    Recommendations for Other Services       Precautions / Restrictions Precautions Precautions: Fall Precaution Comments: monitor SpO2 Restrictions Weight Bearing Restrictions: No    Mobility  Bed Mobility Overal bed mobility: Needs Assistance             General bed mobility comments: up in chair when PT arrived  Transfers Overall transfer level: Needs assistance Equipment used: Rolling walker (2 wheeled) Transfers: Sit to/from Stand Sit to Stand: Min guard Stand pivot transfers: Min guard          Ambulation/Gait Ambulation/Gait assistance: Min guard Gait Distance (Feet): 80 Feet Assistive device: Rolling walker (2 wheeled);1 person hand held assist Gait Pattern/deviations: Step-through pattern;Decreased stride length Gait velocity: decr Gait velocity interpretation: <1.31 ft/sec, indicative of household ambulator General Gait Details: cues for safety   Stairs Stairs: Yes Stairs assistance: Min guard Stair  Management: Two rails;Forwards;Step to pattern Number of Stairs: 3 (up and down) General stair comments: pt was able to climb stairs using LLE to lead but used RLE to support going down   Wheelchair Mobility    Modified Rankin (Stroke Patients Only)       Balance Overall balance assessment: Needs assistance Sitting-balance support: Feet supported Sitting balance-Leahy Scale: Good     Standing balance support: Bilateral upper extremity supported Standing balance-Leahy Scale: Fair Standing balance comment: used RW for safety as pt is unsteady on her feet                            Cognition Arousal/Alertness: Awake/alert Behavior During Therapy: WFL for tasks assessed/performed Overall Cognitive Status: Within Functional Limits for tasks assessed                                 General Comments: reported through translator      Exercises      General Comments General comments (skin integrity, edema, etc.): pt was instructed on safety with transfers, cues for posture and hand placement      Pertinent Vitals/Pain Pain Assessment: No/denies pain Faces Pain Scale: Hurts a little bit Pain Location: general Pain Descriptors / Indicators: Guarding Pain Intervention(s): Monitored during session;Repositioned    Home Living                      Prior Function            PT Goals (current  goals can now be found in the care plan section) Acute Rehab PT Goals Patient Stated Goal: To improve walking and activity tolerance Progress towards PT goals: Progressing toward goals    Frequency    Min 3X/week      PT Plan Current plan remains appropriate    Samul Dada, PT MS Acute Rehab Dept. Number: [facility] [phone] and [facility] [phone] Co-evaluation              AM-PAC PT "6 Clicks" Mobility   Outcome Measure  Help needed turning from your back to your side while in a flat bed without using bedrails?: None Help needed moving from  lying on your back to sitting on the side of a flat bed without using bedrails?: A Little Help needed moving to and from a bed to a chair (including a wheelchair)?: A Little Help needed standing up from a chair using your arms (e.g., wheelchair or bedside chair)?: A Little Help needed to walk in hospital room?: A Little Help needed climbing 3-5 steps with a railing? : A Little 6 Click Score: 19    End of Session Equipment Utilized During Treatment: Gait belt Activity Tolerance: Patient tolerated treatment well;Patient limited by fatigue Patient left: in chair;with call bell/phone within reach;with family/visitor present Nurse Communication: Mobility status PT Visit Diagnosis: Other abnormalities of gait and mobility (R26.89);Muscle weakness (generalized) (M62.81);Unsteadiness on feet (R26.81)     Time: 5035-4656 PT Time Calculation (min) (ACUTE ONLY): 25 min  Charges:  $Gait Training: 8-22 mins $Therapeutic Activity: 8-22 mins                   Samul Dada, PT MS Acute Rehab Dept. Number: [facility] [phone] and [facility] [phone]

## 2020-05-24 NOTE — Plan of Care (Signed)

## 2020-05-24 NOTE — Discharge Summary (Signed)
Physician Discharge Summary  Veronica Castaneda AES:[id number] DOB: [birth year] DOA: 05/12/2020  PCP: Marcine Matar, MD  Admit date: 05/12/2020 Discharge date: 05/24/2020  Recommendations for Outpatient Follow-up:  1. Discharge to home with home health PT 2. Follow up with PCP in 7-10 days. 3. Pt needs a sleep study for obstructive sleep apnea and obesity hypoventilation syndrome as outpatient. Please keep appointment as arranged.   Follow-up Information    Veronica Castaneda. Go on 05/27/2020.   Why: 8:30 am with Dr. Laural Benes Please set patient up for outpatient sleep study for OSA and OHS Contact information: [address] [facility] [address] [phone]       [facility] Follow up.   Specialty: Home Health Services Why: home health services arranged Contact information: [address] [place] [address] [phone]              Discharge Diagnoses: Principal diagnosis is #1 1. Acute hypoxic respiratory failure 2. Respiratory Syncytial Virus 3. Asthma exacerbation 4. Morbid Obesity Class III 5. Obesity Hypoventilation Syndrome 6. Obstructive Sleep Apnea 7. Acute on chronic diastolic dysfunction 8. Difficult Airway 9. Resistant to vaccination for COVID-19  Discharge Condition: Fair  Disposition: Home  Diet recommendation: Heart Healthy  Filed Weights   05/19/20 0400 05/20/20 0500 05/21/20 0500  Weight: 116.3 kg 116.2 kg 118 kg   History of present illness:  59 year old woman with hx of CHF presenting with 4 days worsening dyspnea arrived to ER in extremis and intubated.  History per daughter at bedside with interpreter.  Had headaches, subjective fever, wheezing.  Has history of an unspecified lung disease that sounds like asthma that she gets frequent URIs with.  Was intubated last year with flu B and similar presentation.  PCCM asked to admit for acute hypoxemic and presumed  hypercarbic resp failure.  Hospital Course:  The patient was admitted to [facility]. She required mechanical ventilation. She was treated with nebulizer treatments, steroids, and ketamine. She was extubated on 05/20/2020. She was transferred to the floor on 05/21/2020. She has gradually been weaned down off of oxygen. She has also been evaluated by physical therapy and is cleared for discharge to home with home health PT, a nebulizer machine, albuterol, and trelegy inhaler. I discussed the COVID vaccine with the patient for over 30 minutes. She states that she might get it later, but declined to initiate the 2 shot sequence prior to discharge. She and her husband are aware that should she become infected she has a very high risk of requiring intubation and death.   Today's assessment: S: The patient is sitting up at bedside. No new complaints.  O: Vitals:  Vitals:   05/24/20 0921 05/24/20 1500  BP:  108/62  Pulse:  69  Resp:  14  Temp:  98.4 F (36.9 C)  SpO2: 92% 91%   Exam:  Constitutional:   The patient is awake, alert, and oriented x 3. No acute distress. Respiratory:   No increased work of breathing.  No wheezes, rales, or rhonchi  No tactile fremitus Cardiovascular:   Regular rate and rhythm  No murmurs, ectopy, or gallups.  No lateral PMI. No thrills. Abdomen:   Abdomen is soft, non-tender, non-distended  No hernias, masses, or organomegaly  Normoactive bowel sounds.  Musculoskeletal:   No cyanosis, clubbing, or edema Skin:   No rashes, lesions, ulcers  palpation of skin: no induration or nodules Neurologic:   CN  2-12 intact  Sensation all 4 extremities intact Psychiatric:   Mental status ? Mood, affect appropriate ? Orientation to person, place, time  judgment and insight appear intact Discharge Instructions  Discharge Instructions    Activity as tolerated - No restrictions   Complete by: As directed    Call MD for:  difficulty breathing,  headache or visual disturbances   Complete by: As directed    Call MD for:  temperature >100.4   Complete by: As directed    Diet - low sodium heart healthy   Complete by: As directed    Discharge instructions   Complete by: As directed    Discharge to home with home health PT Follow up with PCP in 7-10 days. Pt needs a sleep study for obstructive sleep apnea and obesity hypoventilation syndrome as outpatient. Please keep appointment as arranged.   For home use only DME Nebulizer machine   Complete by: As directed    Patient needs a nebulizer to treat with the following condition: Asthma   Length of Need: Lifetime   Increase activity slowly   Complete by: As directed      Allergies as of 05/24/2020   No Known Allergies     Medication List    TAKE these medications   albuterol 108 (90 Base) MCG/ACT inhaler Commonly known as: VENTOLIN HFA Inhale 2 puffs into the lungs every 4 (four) hours as needed for wheezing or shortness of breath.   albuterol (2.5 MG/3ML) 0.083% nebulizer solution Commonly known as: PROVENTIL Take 3 mLs (2.5 mg total) by nebulization every 6 (six) hours.   furosemide 20 MG tablet Commonly known as: LASIX Take 1 tablet (20 mg total) by mouth daily. 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 Do not take more than 1 tablet per day What changed:   when to take this  reasons to take this   metFORMIN 500 MG tablet Commonly known as: GLUCOPHAGE Take 0.5 tablets (250 mg total) by mouth daily with breakfast.   multivitamin with minerals Tabs tablet Take 1 tablet by mouth daily.   polyethylene glycol 17 g packet Commonly known as: MIRALAX / GLYCOLAX Take 17 g by mouth daily.   Trelegy Ellipta 100-62.5-25 MCG/INH Aepb Generic drug: Fluticasone-Umeclidin-Vilant Inhale 1 puff into the lungs daily.            Durable Medical Equipment  (From admission, onward)         Start     Ordered   05/24/20 1621  For home use only DME Nebulizer machine   Once       Question Answer Comment  Patient needs a nebulizer to treat with the following condition SOB (shortness of breath)   Length of Need 12 Months      05/24/20 1621   05/24/20 1410  For home use only DME Bipap  Once       Comments: Bipap with IPAP 14 cm H2O and EPAP 8 cm H2O, with heated humidity  Question:  Length of Need  Answer:  Lifetime   05/24/20 1409   05/24/20 0000  For home use only DME Nebulizer machine       Question Answer Comment  Patient needs a nebulizer to treat with the following condition Asthma   Length of Need Lifetime      05/24/20 1032         No Known Allergies  The results of significant diagnostics from this hospitalization (including imaging, microbiology, ancillary and laboratory) are listed  below for reference.    Significant Diagnostic Studies: DG Chest 1 View  Result Date: 05/13/2020 CLINICAL DATA:  Evaluation for pneumothorax. EXAM: CHEST  1 VIEW COMPARISON:  05/12/2020. FINDINGS: Endotracheal tube, NG tube, left IJ line stable position. Cardiomegaly. No pulmonary venous congestion. Persistent left base atelectasis/infiltrate. Small left pleural effusion cannot be excluded. No pneumothorax. IMPRESSION: 1. Lines and tubes in stable position. 2. Persistent left base atelectasis/infiltrate. Small left pleural effusion cannot be excluded. No pneumothorax. 3.  Cardiomegaly.  No pulmonary venous congestion Electronically Signed   By: Maisie Fus  Register   On: 05/13/2020 06:52   DG Chest Port 1 View  Result Date: 05/21/2020 CLINICAL DATA:  Evaluate for interstitial opacities. EXAM: PORTABLE CHEST 1 VIEW COMPARISON:  May 16, 2020 FINDINGS: Stable cardiomegaly. The hila and mediastinum are normal. No pneumothorax. Bilateral pleural effusions with underlying opacities. No overt edema. No other acute abnormalities. Support apparatus is been removed. IMPRESSION: 1. Small bilateral pleural effusions with underlying opacities, possibly atelectasis. Removal of  support apparatus. No other acute abnormalities. Electronically Signed   By: Gerome Sam III M.D   On: 05/21/2020 15:47   DG CHEST PORT 1 VIEW  Result Date: 05/16/2020 CLINICAL DATA:  Mechanically assisted ventilation. EXAM: PORTABLE CHEST 1 VIEW COMPARISON:  One-view chest x-ray 05/13/2020 FINDINGS: Heart is enlarged. Endotracheal tube, left IJ line, and enteric tube is stable. Increasing interstitial and airspace opacities are present at both lung bases. Pleural effusions are suspected. IMPRESSION: 1. Increasing interstitial and airspace opacities at both lung bases. 2. Probable bilateral pleural effusions. Electronically Signed   By: Marin Roberts M.D.   On: 05/16/2020 06:44   DG Chest Portable 1 View  Result Date: 05/12/2020 CLINICAL DATA:  Central line placement EXAM: PORTABLE CHEST 1 VIEW COMPARISON:  Earlier same day FINDINGS: Left neck central line tip in the SVC to split the azygos level, approximately 4 cm above the right atrium. No pneumothorax. Question slight worsening of atelectasis at the lung bases. Endotracheal tube tip 3 cm above the carina. Orogastric or nasogastric tube enters the abdomen. IMPRESSION: Left neck central line placement with tip in the SVC 4 cm above the right atrium. No pneumothorax. Question worsened atelectasis at the lung bases. Electronically Signed   By: Paulina Fusi M.D.   On: 05/12/2020 11:06   DG Chest Portable 1 View  Result Date: 05/12/2020 CLINICAL DATA:  ETT placement EXAM: PORTABLE CHEST 1 VIEW COMPARISON:  May 12, 2020 FINDINGS: Endotracheal tube has been placed since the prior study along with a gastric tube. Endotracheal tube tip is below the clavicular heads though projection is somewhat lordotic. The carina is not well seen. The tip of the tube is likely in the vicinity of the carina perhaps approximately 5 mm from the carina. Cardiomediastinal contours remain enlarged. No dense consolidation.  Improved aeration at the lung bases. On  limited assessment visualized skeletal structures without acute process. IMPRESSION: 1. Endotracheal tube tip is below the clavicular heads though projection is somewhat lordotic. The carina is not well seen. The tip of the tube is likely in the vicinity of the carina perhaps approximately 5 mm from the carina. Correlation with clinical parameters and repeat chest radiograph is suggested with retraction as needed based on follow-up film. 2. Improved aeration at the lung bases. These results were called by telephone at the time of interpretation on 05/12/2020 at 8:44 am to provider [facility] , who verbally acknowledged these results. Electronically Signed   By: Jason Fila.D.  On: 05/12/2020 08:44   DG Chest Port 1 View  Result Date: 05/12/2020 CLINICAL DATA:  Shortness of breath strap EXAM: PORTABLE CHEST 1 VIEW COMPARISON:  12/09/2018 FINDINGS: Cardiomegaly and vascular pedicle widening distorted by leftward rotation. Diffuse interstitial thickening with cephalized flow. Limited assessment of the retrocardiac lung due to overlying soft tissue density. No evidence of pneumothorax. IMPRESSION: CHF pattern. Electronically Signed   By: Marnee Spring M.D.   On: 05/12/2020 07:05    Microbiology: Recent Results (from the past 240 hour(s))  MRSA PCR Screening     Status: None   Collection Time: 05/15/20  7:30 PM   Specimen: Nasopharyngeal  Result Value Ref Range Status   MRSA by PCR NEGATIVE NEGATIVE Final    Comment:        The GeneXpert MRSA Assay (FDA approved for NASAL specimens only), is one component of a comprehensive MRSA colonization surveillance program. It is not intended to diagnose MRSA infection nor to guide or monitor treatment for MRSA infections. Performed at [facility], [address] [place], [place] [address]      Labs: Basic Metabolic Panel: Recent Labs  Lab 05/18/20 0410 05/19/20 0811 05/20/20 0523 05/20/20 1226 05/21/20 0345  NA 141 141 145  146* 145  K 3.1* 3.7 3.3* 4.2 3.5  CL  --   --  107  --  110  CO2  --   --  30  --  25  GLUCOSE  --   --  141*  --  103*  BUN  --   --  36*  --  31*  CREATININE  --   --  0.62  --  0.60  CALCIUM  --   --  9.4  --  9.1  MG  --   --   --   --  2.3  PHOS  --   --   --   --  3.5   Liver Function Tests: No results for input(s): AST, ALT, ALKPHOS, BILITOT, PROT, ALBUMIN in the last 168 hours. No results for input(s): LIPASE, AMYLASE in the last 168 hours. No results for input(s): AMMONIA in the last 168 hours. CBC: Recent Labs  Lab 05/18/20 0410 05/19/20 0811 05/20/20 1226  HGB 15.6* 15.3* 14.3  HCT 46.0 45.0 42.0   Cardiac Enzymes: No results for input(s): CKTOTAL, CKMB, CKMBINDEX, TROPONINI in the last 168 hours. BNP: BNP (last 3 results) Recent Labs    05/12/20 0716  BNP 111.8*    ProBNP (last 3 results) No results for input(s): PROBNP in the last 8760 hours.  CBG: Recent Labs  Lab 05/20/20 1937 05/20/20 2314 05/21/20 0325 05/21/20 0812 05/21/20 1213  GLUCAP 112* 103* 107* 131* 157*    Principal Problem:   Acute respiratory failure with hypoxia (HCC) Active Problems:   Asthma exacerbation   Morbid obesity with body mass index (BMI) of 50.0 to 59.9 in adult (HCC)   Obesity hypoventilation syndrome (HCC)   OSA (obstructive sleep apnea)   RSV (respiratory syncytial virus infection)   Difficult airway for intubation  Time coordinating discharge: 38 minutes  Signed:        Kiernan Atkerson, DO [facility]  05/24/2020, 5:39 PM

## 2020-05-24 NOTE — TOC Transition Note (Signed)
Transition of Care [facility]) - CM/SW Discharge Note   Patient Details  Name: Veronica Castaneda MRN: [id number] Date of Birth: [birth year]  Transition [facility]) CM/SW Contact:  Epifanio Lesches, RN Phone Number: 05/24/2020, 3:42 PM   Clinical Narrative:    Admit for acute hypoxemic and presumed hypercarbic resp failure. Pt will transition to home today. Pt resides with husband and daughter. Pt without health insurance. Agreeable to home health services. Referral made with [facility] ( charity case) for home health PT, OT services and accepted . DME : BIPAP AND NEBULIZER will be delivered to bedside prior to d/c from [facility] [phone]), LOG received from [facility] supervision for DME needs. TOC pharmacy to deliver Rx meds to bedside prior to d/c. Match Letter given to assist with med cost. Pt jobless, no insurance. Active with [facility]. Post hospital f/u arranged, refer to [facility].  Husband to provide transportation to home.  Final next level of care: Home w Home Health Services Barriers to Discharge: No Barriers Identified   Patient Goals and CMS Choice     Choice offered to / list presented to : Patient  Discharge Placement                       Discharge Plan and Services                DME Arranged: Bipap, nebulizer DME Agency: [facility] Date DME Agency Contacted: 05/24/20 Time DME Agency Contacted: [phone] Representative spoke with at DME Agency: Ian Malkin HH Arranged: PT, OT   Date HH Agency Contacted: 05/24/20 Time HH Agency Contacted: [phone] Representative spoke with at [facility] Agency: [place]  Social Determinants of Health (SDOH) Interventions     Readmission Risk Interventions No flowsheet data found.

## 2020-05-25 ENCOUNTER — Telehealth: Payer: Self-pay | Admitting: Internal Medicine

## 2020-05-25 ENCOUNTER — Telehealth: Payer: Self-pay

## 2020-05-25 NOTE — Telephone Encounter (Signed)
Pt's daughter returning a call from [facility], states "Regarding medications."   Assisted by interpreter Toy Cookey.  Difficult to assess what pt had available.  Daughter states "Has no medications and neb machine does not work right." HAs not taken any meds "Don't have them." Advised to CB in AM for further direction from [facility]. Daughter states pt "Feels good." States no SOB.

## 2020-05-25 NOTE — Telephone Encounter (Signed)
Transition Care Management Follow-up Telephone Call Call completed with Spanish interpreter # [phone] with [facility] [employer].    Date of discharge and from where:  05/24/2020, [facility]   How have you been since you were released from the hospital?  She said she is "feeling better,fine."   Any questions or concerns?  she said she had no concerns/questions at this time  Items Reviewed:  Did the pt receive and understand the discharge instructions provided?  The patient said yes  Medications obtained and verified?  she was not able to verify what medications she has has. She had a discharge list of medications with her but the interpreter said that she has difficulty reading [place].  The patient also stated that her husband does not read Spanish.  Her daughter who could assist with reviewing the medications will not be home until 1730. This CM instructed the patient to have her daughter review the instructions and check the medications and call this CM back with any questions/concerns. The patient that she understood. Clinic phone number provided.   Any new allergies since your discharge?  none reported   Do you have support at home?  her husband  She said she has nebulizer and BiPAP, then she said she only has one machine.  She said she used the "breathing machine" at night  and will speak to her provider about how long she will need to use it. She was not able to confirm what machine(s) she had.    Referral was made to [facility] for home PT/OT but the patient has not heard from anyone yet.  This CM spoke to Gae Gallop, RN CM to inquire about what equipment the patient was sent home with.  [name] spoke to Zach/[facility] and his team will follow up to confirm what DME the patient received. The patient was supposed to receive a BiPAP and nebulizer as well as her medications at discharge   Functional Questionnaire: (I = Independent and D = Dependent) ADLs: family assists as  needed.  She has not tried bathing and showering by herself.   Has RW for ambulation.    Follow up appointments reviewed:   PCP Hospital f/u appt confirmed?Dr Laural Benes 05/27/2020,  Specialist Hospital f/u appt confirmed? None scheduled at this time   Are transportation arrangements needed? No ,her husband drives  If their condition worsens, is the pt aware to call PCP or go to the Emergency Dept.?  yes  Was the patient provided with contact information for the PCP's office or ED?  yes  Was to pt encouraged to call back with questions or concerns?  yes

## 2020-05-27 ENCOUNTER — Other Ambulatory Visit: Payer: Self-pay

## 2020-05-27 ENCOUNTER — Other Ambulatory Visit: Payer: Self-pay | Admitting: Internal Medicine

## 2020-05-27 ENCOUNTER — Ambulatory Visit: Payer: Self-pay | Attending: Internal Medicine | Admitting: Internal Medicine

## 2020-05-27 ENCOUNTER — Telehealth: Payer: Self-pay

## 2020-05-27 ENCOUNTER — Telehealth: Payer: Self-pay | Admitting: Internal Medicine

## 2020-05-27 VITALS — BP 93/58 | HR 83 | Wt 252.0 lb

## 2020-05-27 DIAGNOSIS — R7303 Prediabetes: Secondary | ICD-10-CM

## 2020-05-27 DIAGNOSIS — J45901 Unspecified asthma with (acute) exacerbation: Secondary | ICD-10-CM

## 2020-05-27 DIAGNOSIS — R0683 Snoring: Secondary | ICD-10-CM

## 2020-05-27 DIAGNOSIS — Z23 Encounter for immunization: Secondary | ICD-10-CM

## 2020-05-27 DIAGNOSIS — B974 Respiratory syncytial virus as the cause of diseases classified elsewhere: Secondary | ICD-10-CM

## 2020-05-27 DIAGNOSIS — R600 Localized edema: Secondary | ICD-10-CM

## 2020-05-27 DIAGNOSIS — Z7189 Other specified counseling: Secondary | ICD-10-CM

## 2020-05-27 DIAGNOSIS — B338 Other specified viral diseases: Secondary | ICD-10-CM

## 2020-05-27 DIAGNOSIS — I959 Hypotension, unspecified: Secondary | ICD-10-CM

## 2020-05-27 DIAGNOSIS — Z09 Encounter for follow-up examination after completed treatment for conditions other than malignant neoplasm: Secondary | ICD-10-CM

## 2020-05-27 LAB — GLUCOSE, POCT (MANUAL RESULT ENTRY): POC Glucose: 146 mg/dl — AB (ref 70–99)

## 2020-05-27 MED ORDER — FUROSEMIDE 20 MG PO TABS: 20.0000 mg | ORAL_TABLET | Freq: Every day | ORAL | 6 refills | Status: DC

## 2020-05-27 MED ORDER — METFORMIN HCL 500 MG PO TABS: 250.0000 mg | ORAL_TABLET | Freq: Every day | ORAL | 6 refills | Status: DC

## 2020-05-27 MED ORDER — TRELEGY ELLIPTA 100-62.5-25 MCG/INH IN AEPB: 1.0000 | INHALATION_SPRAY | Freq: Every day | RESPIRATORY_TRACT | 2 refills | Status: DC

## 2020-05-27 MED FILL — ?METFORMIN HCL 500MG TABL: 500 | 30 days supply | Qty: 15 | Fill #0

## 2020-05-27 MED FILL — TRELEGY ELLIPTA 100-62.5-25: 100-62.5-25 | 30 days supply | Qty: 60 | Fill #0

## 2020-05-27 MED FILL — ?FUROSEMIDE 20MG TABLET: 20 | 30 days supply | Qty: 30 | Fill #0

## 2020-05-27 NOTE — Telephone Encounter (Signed)
-----   Message from Robyne Peers, RN sent at 05/27/2020  1:12 PM EDT ----- I just called [facility] and they said she was given a BiPAP when she left the hospital.  She needs to pick up the nebulizer at their retail store ----- Message ----- From: Marcine Matar, MD Sent: 05/27/2020  12:45 PM EDT To: Robyne Peers, RN  I doubt she has Bipap from hosp.  I saw her today.  I think she received neb machine from them.  I have submitted referral again to try to get her a sleep study.  I had referred her several months ago.  COVID-19 testing was required prior to the study being scheduled.  She did have the Covid test and was told to remain quarantined until she goes in to have the sleep study in a few days.  However when they called her, she had broken quarantine so the test was canceled.  For sure I think she has obstructive sleep apnea and or obesity hypoventilation syndrome.

## 2020-05-27 NOTE — Progress Notes (Signed)
Patient ID: Veronica Castaneda, female    DOB: [birth year]  MRN: [id number]  CC: Transition of Care Date of hospitalization: 8/19-31/2021 Date of phone call from case worker: 05/25/2020  Subjective: Veronica Castaneda is a 59 y.o. female who presents for transition of care.  Stefan Church, her daughter is with her.  Her concerns today include:  Pt withmorbidobesity, OA LT knee, likely OSA/OHS, prediabetes, mixed HL.  Patient hospitalized with worsening shortness of breath x4 days.  She was hypoxic and had to be intubated.  Found to have respiratory syncytial virus and also thought to be having asthma exacerbation.  Covid test was negative.  She was treated with nebulizers, steroids and ketamine.  She was extubated successfully and discharged with a nebulizer machine, nebulizer treatments, albuterol and trilogy inhalers.  Plan was made for home physical therapy.  She declined initiation of COVID-19 vaccination.  It was recommended that patient have a sleep study done as soon as possible for possible sleep apnea/obesity hypoventilation syndrome.  Today: Patient reports that she is breathing much better.  She denies any cough or fever since being home.  She is not using the trilogy or albuterol inhaler.  She tells me that she uses her machine at night which I assume is the nebulizer treatment.  She is agreeable to having the flu shot today.  She still has not made up her mind about having the COVID-19 vaccine but tells me she may get it later.  Physical therapist is supposed to come out to her house today.  She ambulates with a rolling walker which she has with her today.  - She takes the furosemide as needed.  Weight is down 8 pounds from hospitalization.  Obesity/prediabetes: She tells me that she is taking the Metformin as prescribed.  Blood pressure noted to be low today.  She reports that she is drinking adequate fluid and has good overall oral intake.    Patient Active  Problem List   Diagnosis Date Noted  . Asthma exacerbation 05/22/2020  . Morbid obesity with body mass index (BMI) of 50.0 to 59.9 in adult [facility]) 05/22/2020  . Obesity hypoventilation syndrome (HCC) 05/22/2020  . OSA (obstructive sleep apnea) 05/22/2020  . RSV (respiratory syncytial virus infection) 05/22/2020  . Difficult airway for intubation 05/22/2020  . Acute respiratory failure with hypoxia (HCC) 05/12/2020  . Loud snoring 02/11/2020  . Daytime sleepiness 02/11/2020  . Screening breast examination 10/22/2019  . Prediabetes 08/18/2019  . Hyperlipidemia 08/18/2019  . Morbid obesity (HCC) 08/17/2019     Current Outpatient Medications on File Prior to Visit  Medication Sig Dispense Refill  . albuterol (PROVENTIL) (2.5 MG/3ML) 0.083% nebulizer solution Take 3 mLs (2.5 mg total) by nebulization every 6 (six) hours. 75 mL 2  . albuterol (VENTOLIN HFA) 108 (90 Base) MCG/ACT inhaler Inhale 2 puffs into the lungs every 4 (four) hours as needed for wheezing or shortness of breath. 8 g 2  . Fluticasone-Umeclidin-Vilant (TRELEGY ELLIPTA) 100-62.5-25 MCG/INH AEPB Inhale 1 puff into the lungs daily. 1 each 0  . furosemide (LASIX) 20 MG tablet Take 1 tablet (20 mg total) by mouth daily. 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 Do not take more than 1 tablet per day 30 tablet 2  . metFORMIN (GLUCOPHAGE) 500 MG tablet Take 0.5 tablets (250 mg total) by mouth daily with breakfast. 30 tablet 6  . Multiple Vitamin (MULTIVITAMIN WITH MINERALS) TABS tablet Take 1 tablet by mouth daily. 30 tablet 0  .  polyethylene glycol (MIRALAX / GLYCOLAX) 17 g packet Take 17 g by mouth daily. 14 each 0   No current facility-administered medications on file prior to visit.    No Known Allergies  Social History   Socioeconomic History  . Marital status: Single    Spouse name: Not on file  . Number of children: 3  . Years of education: Not on file  . Highest education level: 2nd grade  Occupational History  .  Occupation: house wife  Tobacco Use  . Smoking status: Never Smoker  . Smokeless tobacco: Never Used  Vaping Use  . Vaping Use: Never used  Substance and Sexual Activity  . Alcohol use: Never  . Drug use: Never  . Sexual activity: Not Currently  Other Topics Concern  . Not on file  Social History Narrative  . Not on file   Social Determinants of Health   Financial Resource Strain:   . Difficulty of Paying Living Expenses: Not on file  Food Insecurity:   . Worried About Programme researcher, broadcasting/film/video in the Last Year: Not on file  . Ran Out of Food in the Last Year: Not on file  Transportation Needs: Unmet Transportation Needs  . Lack of Transportation (Medical): Yes  . Lack of Transportation (Non-Medical): Yes  Physical Activity:   . Days of Exercise per Week: Not on file  . Minutes of Exercise per Session: Not on file  Stress:   . Feeling of Stress : Not on file  Social Connections:   . Frequency of Communication with Friends and Family: Not on file  . Frequency of Social Gatherings with Friends and Family: Not on file  . Attends Religious Services: Not on file  . Active Member of Clubs or Organizations: Not on file  . Attends Banker Meetings: Not on file  . Marital Status: Not on file  Intimate Partner Violence:   . Fear of Current or Ex-Partner: Not on file  . Emotionally Abused: Not on file  . Physically Abused: Not on file  . Sexually Abused: Not on file    Family History  Problem Relation Age of Onset  . Heart attack Mother   . Heart disease Mother     Past Surgical History:  Procedure Laterality Date  . NO PAST SURGERIES    . No PAST SURGICAL HISTORY      ROS: Review of Systems Negative except as stated above  PHYSICAL EXAM: BP (!) 93/58   Pulse 83   Wt 252 lb (114.3 kg)   SpO2 92%   BMI 49.22 kg/m   Wt Readings from Last 3 Encounters:  05/27/20 252 lb (114.3 kg)  05/21/20 260 lb 2.3 oz (118 kg)  02/11/20 277 lb 3.2 oz (125.7 kg)     Physical Exam  General appearance - alert, well appearing, morbidly obese middle-aged Hispanic female and in no distress Mental status - normal mood, behavior, speech, dress, motor activity, and thought processes Neck - supple, no significant adenopathy Chest - clear to auscultation, no wheezes, rales or rhonchi, symmetric air entry Heart - normal rate, regular rhythm, normal S1, S2, no murmurs, rubs, clicks or gallops Extremities -trace to 1+ lower extremity edema CMP Latest Ref Rng & Units 05/21/2020 05/20/2020 05/20/2020  Glucose 70 - 99 mg/dL 376(E) - 831(D)  BUN 6 - 20 mg/dL 17(O) - 16(W)  Creatinine 0.44 - 1.00 mg/dL 7.37 - 1.06  Sodium 269 - 145 mmol/L 145 146(H) 145  Potassium 3.5 - 5.1  mmol/L 3.5 4.2 3.3(L)  Chloride 98 - 111 mmol/L 110 - 107  CO2 22 - 32 mmol/L 25 - 30  Calcium 8.9 - 10.3 mg/dL 9.1 - 9.4  Total Protein 6.5 - 8.1 g/dL - - -  Total Bilirubin 0.3 - 1.2 mg/dL - - -  Alkaline Phos 38 - 126 U/L - - -  AST 15 - 41 U/L - - -  ALT 0 - 44 U/L - - -   Lipid Panel     Component Value Date/Time   CHOL 278 (H) 08/17/2019 1144   TRIG 216 (H) 05/15/2020 1643   HDL 46 08/17/2019 1144   CHOLHDL 6.0 (H) 08/17/2019 1144   LDLCALC 170 (H) 08/17/2019 1144    CBC    Component Value Date/Time   WBC 7.4 05/17/2020 0429   RBC 4.59 05/17/2020 0429   HGB 14.3 05/20/2020 1226   HGB 14.3 08/17/2019 1144   HCT 42.0 05/20/2020 1226   HCT 43.7 08/17/2019 1144   PLT 149 (L) 05/17/2020 0429   PLT 220 08/17/2019 1144   MCV 94.3 05/17/2020 0429   MCV 94 08/17/2019 1144   MCH 27.9 05/17/2020 0429   MCHC 29.6 (L) 05/17/2020 0429   RDW 14.9 05/17/2020 0429   RDW 11.9 08/17/2019 1144   LYMPHSABS 1.0 05/12/2020 0654   MONOABS 1.0 05/12/2020 0654   EOSABS 0.1 05/12/2020 0654   BASOSABS 0.0 05/12/2020 0654    ASSESSMENT AND PLAN: 1. Hospital discharge follow-up 2. Respiratory syncytial virus Patient feels her breathing is back to her baseline.  3. Persistent asthma with  acute exacerbation, unspecified asthma severity Advised patient to use the trilogy inhaler once daily.  She is using nebulizer treatments once a day at bedtime. - Fluticasone-Umeclidin-Vilant (TRELEGY ELLIPTA) 100-62.5-25 MCG/INH AEPB; Inhale 1 puff into the lungs daily.  Dispense: 1 each; Refill: 2  4. Hypotension, unspecified hypotension type Blood pressure today a little on the low side.  I have told her to hold the furosemide for a few days  5. Loud snoring Referred for sleep study in the past but never materialized.  I will resubmit referral again today - [facility] Sleep Study; Future  6. Prediabetes - POCT glucose (manual entry) - metFORMIN (GLUCOPHAGE) 500 MG tablet; Take 0.5 tablets (250 mg total) by mouth daily with breakfast.  Dispense: 30 tablet; Refill: 6 - Basic Metabolic Panel  7. Pedal edema - furosemide (LASIX) 20 MG tablet; Take 1 tablet (20 mg total) by mouth daily. 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 Do not take more than 1 tablet per day  Dispense: 30 tablet; Refill: 6  8. Educated about COVID-19 virus infection Strongly advised patient to get the COVID-19 vaccine.  This is the second time that she has had to be intubated with a respiratory virus.  I told her that should she get COVID-19 it may be detrimental to her.  9. Need for immunization against influenza - Flu Vaccine QUAD 36+ mos IM     Patient was given the opportunity to ask questions.  Patient verbalized understanding of the plan and was able to repeat key elements of the plan.  [employer] interpreter used during this encounter. #[id number]  Orders Placed This Encounter  Procedures  . POCT glucose (manual entry)     Requested Prescriptions    No prescriptions requested or ordered in this encounter    No follow-ups on file.  Jonah Blue, MD, FACP

## 2020-05-27 NOTE — Telephone Encounter (Signed)
Call placed to [facility] ,spoke to [place] who stated that the patient was given a BiPAP when she left the hospital and she needs to pick up the nebulizer at the retail store

## 2020-05-28 LAB — BASIC METABOLIC PANEL
BUN/Creatinine Ratio: 18 (ref 9–23)
BUN: 9 mg/dL (ref 6–24)
CO2: 24 mmol/L (ref 20–29)
Calcium: 9.1 mg/dL (ref 8.7–10.2)
Chloride: 102 mmol/L (ref 96–106)
Creatinine, Ser: 0.49 mg/dL — ABNORMAL LOW (ref 0.57–1.00)
GFR calc Af Amer: 126 mL/min/{1.73_m2} (ref >59)
GFR calc non Af Amer: 109 mL/min/{1.73_m2} (ref >59)
Glucose: 107 mg/dL — ABNORMAL HIGH (ref 65–99)
Potassium: 4.2 mmol/L (ref 3.5–5.2)
Sodium: 140 mmol/L (ref 134–144)

## 2020-05-31 ENCOUNTER — Telehealth: Payer: Self-pay

## 2020-05-31 NOTE — Telephone Encounter (Signed)
[employer]  Mikle Bosworth Id#  [id number] contacted pt to go over lab results pt didn't answer lvm asking pt to give a call back

## 2020-06-06 ENCOUNTER — Ambulatory Visit: Payer: Self-pay | Admitting: Internal Medicine

## 2020-06-06 ENCOUNTER — Other Ambulatory Visit: Payer: Self-pay

## 2020-06-28 ENCOUNTER — Telehealth: Payer: Self-pay | Admitting: Internal Medicine

## 2020-06-28 ENCOUNTER — Other Ambulatory Visit: Payer: Self-pay | Admitting: Internal Medicine

## 2020-06-28 MED ORDER — ALBUTEROL SULFATE HFA 108 (90 BASE) MCG/ACT IN AERS: 2.0000 | INHALATION_SPRAY | RESPIRATORY_TRACT | 6 refills | Status: DC | PRN

## 2020-06-28 NOTE — Telephone Encounter (Signed)
Jamesetta Orleans pt has refills on Metformin pt will need to contact pharmacy to get refill   Dr. Laural Benes pt is requesting a refill on Albuterol Inhaler

## 2020-06-29 MED FILL — ALBUTEROL SULFATE HFA 108 (: 108 (90 BAS | 25 days supply | Qty: 18 | Fill #0

## 2020-07-06 MED FILL — ALBUTEROL SULFATE HFA 108 (: 108 (90 BAS | 25 days supply | Qty: 18 | Fill #0

## 2020-07-08 IMAGING — DX DG CHEST 1V PORT
1 series · 1 of 1 positions shown · non-contrast
Comparison: None.

CLINICAL DATA: Shortness of breath for the past 3 days. Nonsmoker.

EXAM:
PORTABLE CHEST 1 VIEW

[chest]
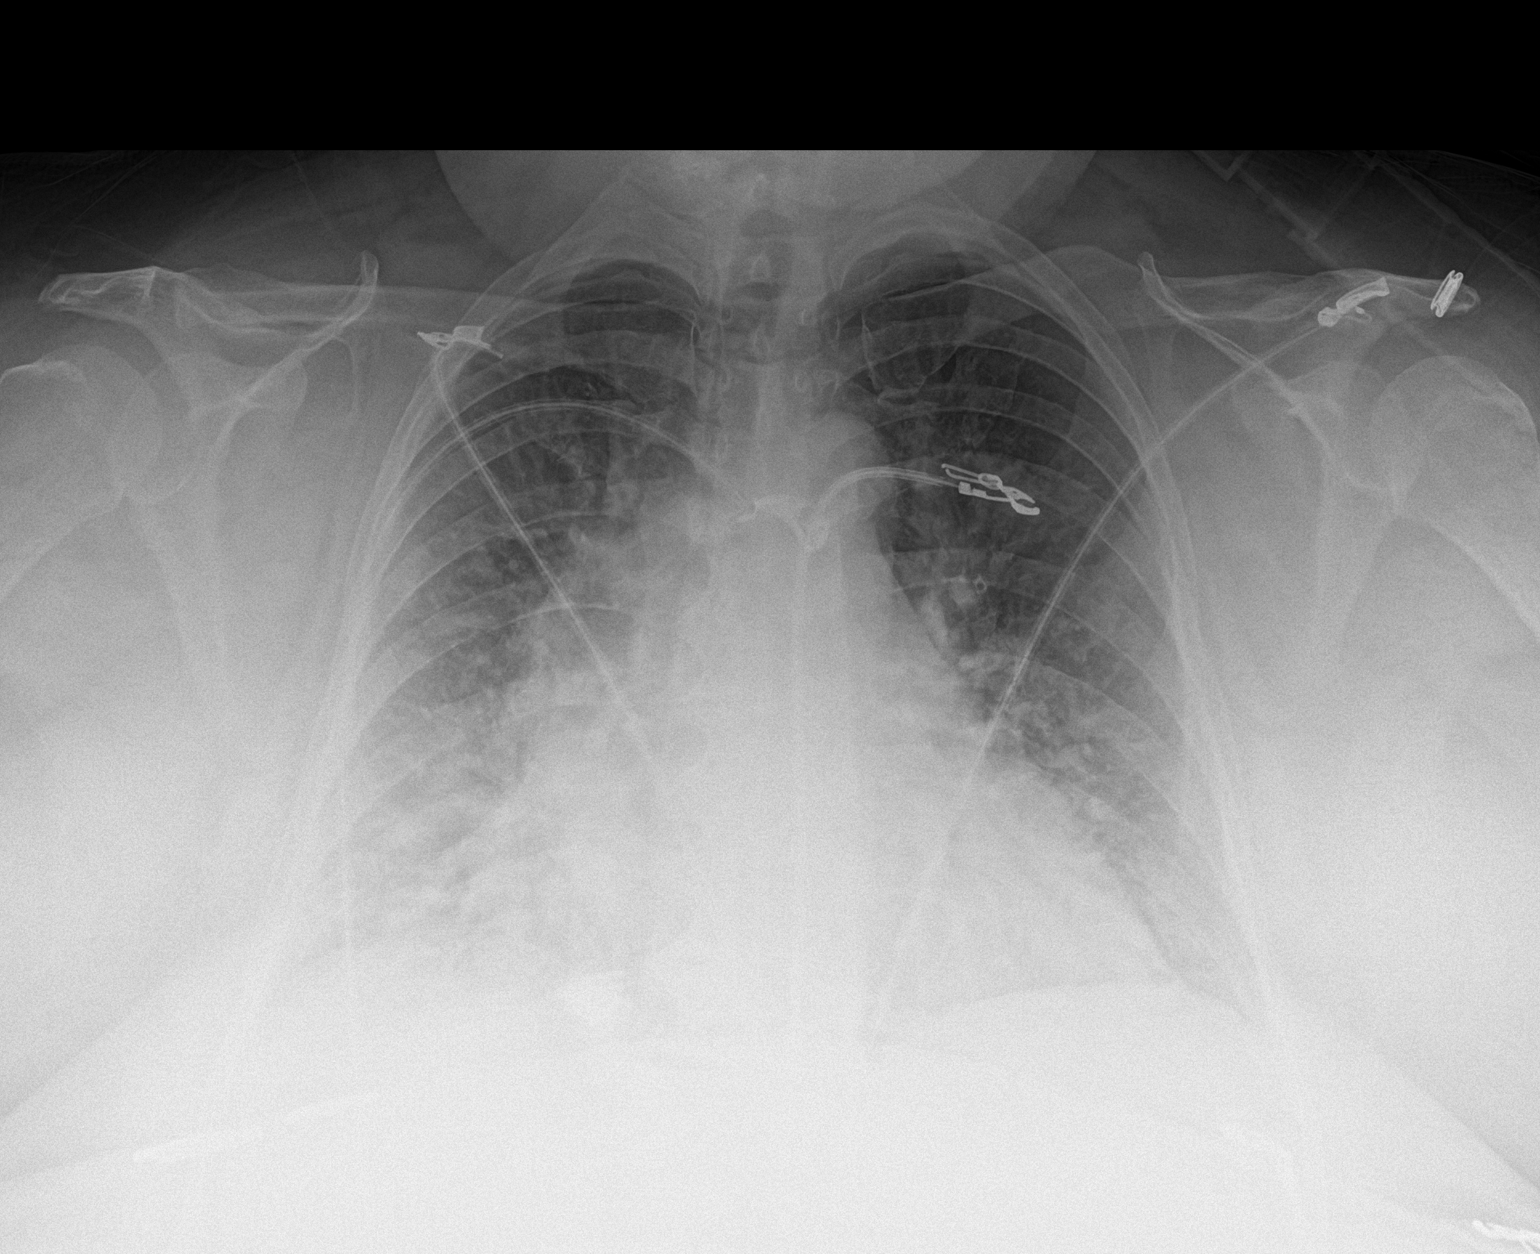

[1 of 1 positions shown; findings below may reference images not displayed]

FINDINGS: Enlarged cardiac silhouette. Prominent pulmonary vasculature and
interstitial markings. No visible Kerley lines or pleural fluid.
Mild peribronchial thickening. Thoracic spine degenerative changes.
IMPRESSION: Cardiomegaly and mild changes of congestive heart failure.

## 2020-07-11 IMAGING — DX PORTABLE CHEST - 1 VIEW
1 series · 1 of 1 positions shown · non-contrast
Comparison: November 30, 2018.

CLINICAL DATA: Hypoxia

EXAM:
PORTABLE CHEST 1 VIEW

[chest ap]
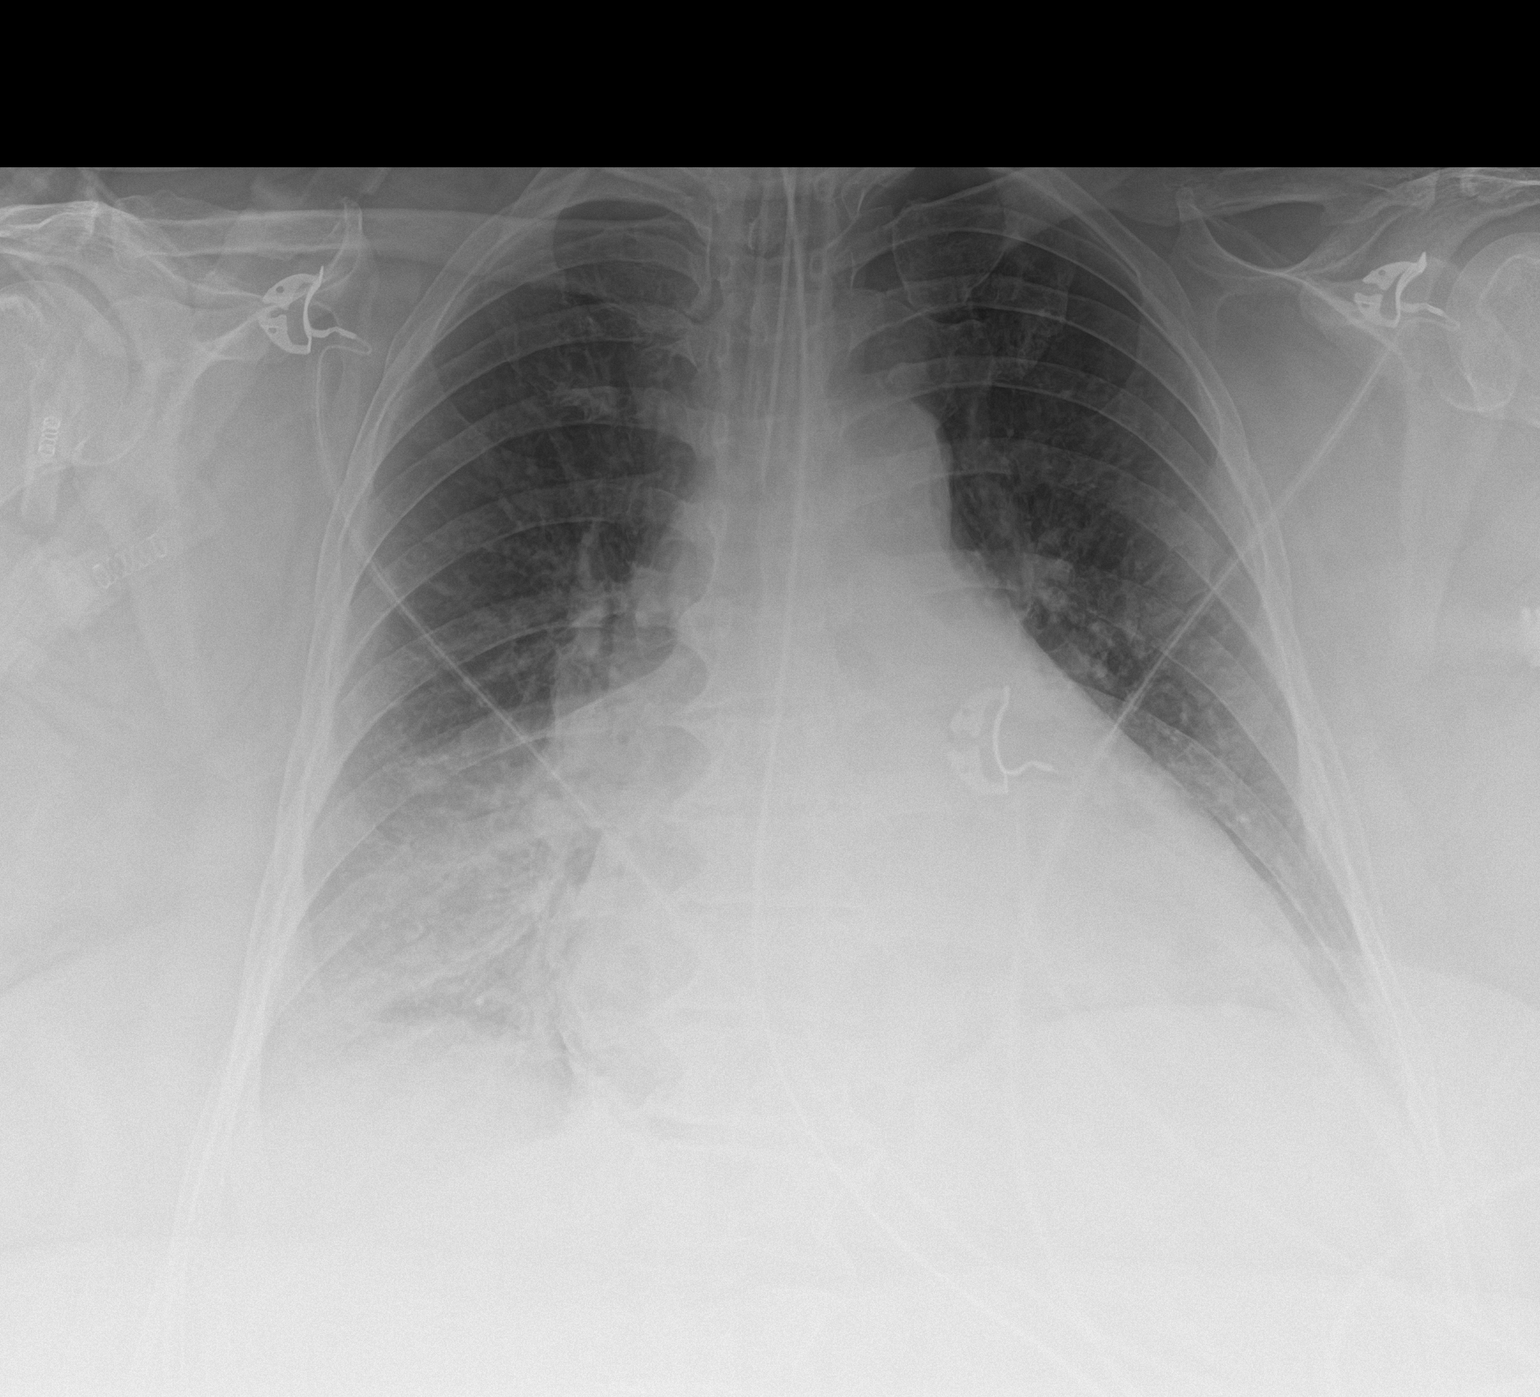

[1 of 1 positions shown; findings below may reference images not displayed]

FINDINGS: Endotracheal tube tip is 2.5 cm above the carina. Nasogastric tube
tip and side port are below the diaphragm. No pneumothorax. There is
cardiomegaly with mild pulmonary venous hypertension. No frank edema
or consolidation. There is a small right pleural effusion. There is
degenerative change in the thoracic spine. There is an old healed
fracture of the right clavicle. No evident adenopathy.
IMPRESSION: Tube positions as described without pneumothorax. Pulmonary vascular
congestion with small right pleural effusion. No frank edema or
consolidation.

## 2020-07-11 MED FILL — METFORMIN HCL 500 MG TABS: 500 | 30 days supply | Qty: 15 | Fill #1

## 2020-07-11 MED FILL — FUROSEMIDE 20 MG TABS: 20 | 30 days supply | Qty: 30 | Fill #1

## 2020-07-12 IMAGING — DX PORTABLE CHEST - 1 VIEW
1 series · 1 of 1 positions shown · non-contrast
Comparison: 12/01/2018

CLINICAL DATA: Show shortness of breath

EXAM:
PORTABLE CHEST 1 VIEW

[chest ap]
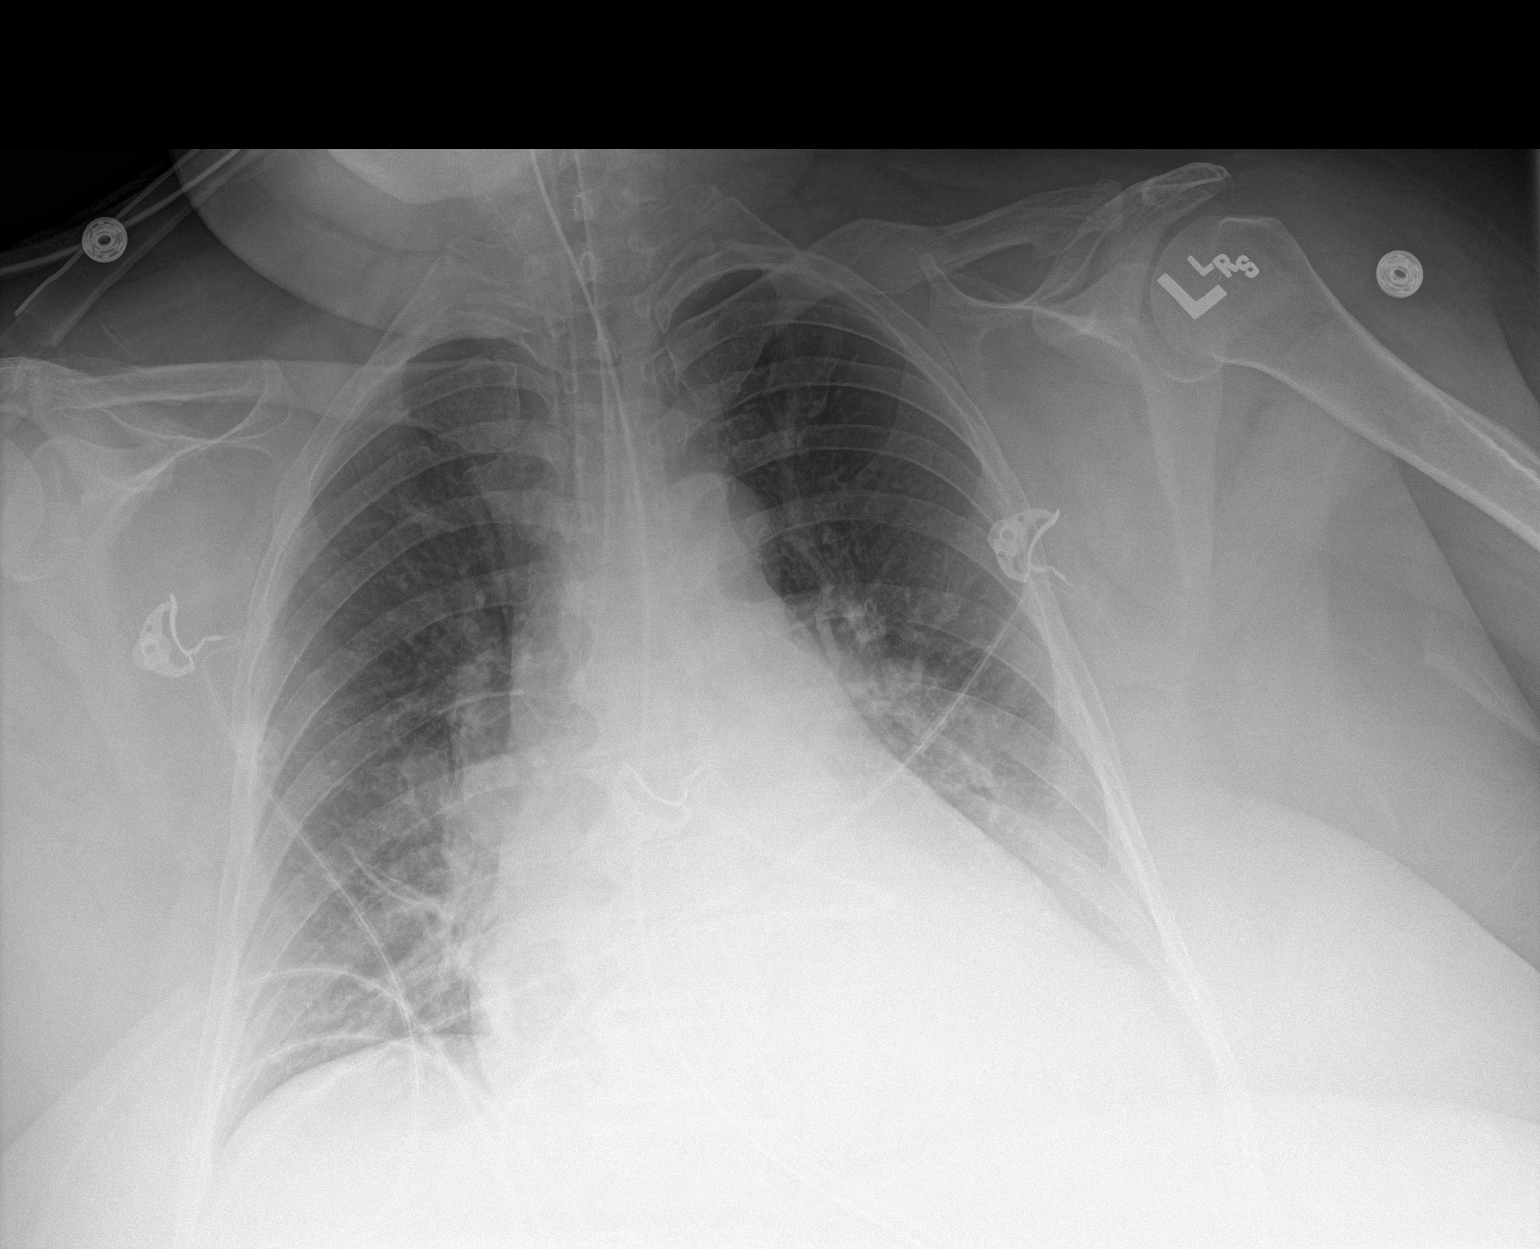

[1 of 1 positions shown; findings below may reference images not displayed]

FINDINGS: Endotracheal tube and gastric catheter are again seen and stable.
Cardiac shadow remains enlarged. Lungs are well aerated bilaterally
with improved aeration in the right base. Mild increased density in
the left retrocardiac region is noted consistent with atelectasis.
No bony abnormality is seen. Persistent central vascular congestion
is noted.
IMPRESSION: Stable vascular congestion.

Improved aeration in the right base with increase in left basilar
atelectasis.

## 2020-07-28 ENCOUNTER — Telehealth: Payer: Self-pay

## 2020-07-28 ENCOUNTER — Encounter: Payer: Self-pay | Admitting: Internal Medicine

## 2020-07-28 ENCOUNTER — Other Ambulatory Visit: Payer: Self-pay

## 2020-07-28 ENCOUNTER — Ambulatory Visit: Payer: Self-pay | Attending: Internal Medicine | Admitting: Internal Medicine

## 2020-07-28 ENCOUNTER — Other Ambulatory Visit: Payer: Self-pay | Admitting: Internal Medicine

## 2020-07-28 DIAGNOSIS — J45998 Other asthma: Secondary | ICD-10-CM

## 2020-07-28 DIAGNOSIS — F458 Other somatoform disorders: Secondary | ICD-10-CM

## 2020-07-28 MED ORDER — TRELEGY ELLIPTA 100-62.5-25 MCG/INH IN AEPB: 1.0000 | INHALATION_SPRAY | Freq: Every day | RESPIRATORY_TRACT | 2 refills | Status: DC

## 2020-07-28 MED ORDER — ALBUTEROL SULFATE (2.5 MG/3ML) 0.083% IN NEBU: 2.5000 mg | INHALATION_SOLUTION | Freq: Four times a day (QID) | RESPIRATORY_TRACT | 4 refills | Status: DC | PRN

## 2020-07-28 MED FILL — ALBUTEROL 0.083% INHAL SOLN: (2.5 MG/3ML | 6 days supply | Qty: 75 | Fill #0

## 2020-07-28 MED FILL — TRELEGY ELLIPTA 100-62.5-25: 100-62.5-25 | 30 days supply | Qty: 60 | Fill #0

## 2020-07-28 NOTE — Progress Notes (Signed)
Virtual Visit via Telephone Note Due to current restrictions/limitations of in-office visits due to the COVID-19 pandemic, this scheduled clinical appointment was converted to a telehealth visit  I connected with Milus Height on 07/28/20 at 10:25 a.m by telephone and verified that I am speaking with the correct person using two identifiers.  Location: Patient: home Provider: office  pt, myself and Leonardo from [employer]  [phone]) participated in this call I discussed the limitations, risks, security and privacy concerns of performing an evaluation and management service by telephone and the availability of in person appointments. I also discussed with the patient that there may be a patient responsible charge related to this service. The patient expressed understanding and agreed to proceed.   History of Present Illness: Pt withmorbidobesity, OA LT knee, likely OSA/OHS, prediabetes, mixed HL, persistent asthma.  Pt reports no major concern since last visit. On last visit, I wanted to clarify whether the machine that she said she had was a BiPAP machine vs a nebulizer machine.  Today she tells me it is a machine that she puts water in it because she is out of the liquid albuterol.  Using Albuterol inhaler Q 6 hrs.  Out of Trelegy. She also reports having another machine that was delivered to her house 1 wk after she was dischg from hosp. Pt states it was left at her door and no one contacted her or showed her how to use it. So she has not been using it.  I suspect this may be the BiPAP machine as I did have our caseworker try to inquire about whether she had received 1 at the time of discharge from the hospital.  She had spoken with someone from [facility] who reported that the patient was given 1.  Denies any increase SOB, CP or cough.  Minimal swelling in legs.    Declines COVID vaccine  Outpatient Encounter Medications as of 07/28/2020  Medication Sig  . albuterol  (PROVENTIL) (2.5 MG/3ML) 0.083% nebulizer solution Take 3 mLs (2.5 mg total) by nebulization every 6 (six) hours.  Marland Kitchen albuterol (VENTOLIN HFA) 108 (90 Base) MCG/ACT inhaler Inhale 2 puffs into the lungs every 4 (four) hours as needed for wheezing or shortness of breath.  . Fluticasone-Umeclidin-Vilant (TRELEGY ELLIPTA) 100-62.5-25 MCG/INH AEPB Inhale 1 puff into the lungs daily.  . furosemide (LASIX) 20 MG tablet Take 1 tablet (20 mg total) by mouth daily. 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 Do not take more than 1 tablet per day  . metFORMIN (GLUCOPHAGE) 500 MG tablet Take 0.5 tablets (250 mg total) by mouth daily with breakfast.  . Multiple Vitamin (MULTIVITAMIN WITH MINERALS) TABS tablet Take 1 tablet by mouth daily.  . polyethylene glycol (MIRALAX / GLYCOLAX) 17 g packet Take 17 g by mouth daily.   No facility-administered encounter medications on file as of 07/28/2020.      Observations/Objective: Results for orders placed or performed in visit on 05/27/20  Basic Metabolic Panel  Result Value Ref Range   Glucose 107 (H) 65 - 99 mg/dL   BUN 9 6 - 24 mg/dL   Creatinine, Ser 4.19 (L) 0.57 - 1.00 mg/dL   GFR calc non Af Amer 109 >59 mL/min/1.73   GFR calc Af Amer 126 >59 mL/min/1.73   BUN/Creatinine Ratio 18 9 - 23   Sodium 140 134 - 144 mmol/L   Potassium 4.2 3.5 - 5.2 mmol/L   Chloride 102 96 - 106 mmol/L   CO2 24 20 -  29 mmol/L   Calcium 9.1 8.7 - 10.2 mg/dL  POCT glucose (manual entry)  Result Value Ref Range   POC Glucose 146 (A) 70 - 99 mg/dl     Assessment and Plan: 1. Asthma, persistent controlled - albuterol (PROVENTIL) (2.5 MG/3ML) 0.083% nebulizer solution; Take 3 mLs (2.5 mg total) by nebulization every 6 (six) hours as needed for wheezing or shortness of breath.  Dispense: 75 mL; Refill: 4 - Fluticasone-Umeclidin-Vilant (TRELEGY ELLIPTA) 100-62.5-25 MCG/INH AEPB; Inhale 1 puff into the lungs daily.  Dispense: 1 each; Refill: 2  2. Hyperventilation  syndrome Message sent to the caseworker requesting that she call [facility] to see whether they were the ones who delivered the BiPAP machine to the patient's house and whether some arrangement can be made for someone to go out to the house or meet the patient here at our facility to teach her use of the machine.  3.  Declines COVID vaccine  Follow Up Instructions: 3-4 mths   I discussed the assessment and treatment plan with the patient. The patient was provided an opportunity to ask questions and all were answered. The patient agreed with the plan and demonstrated an understanding of the instructions.   The patient was advised to call back or seek an in-person evaluation if the symptoms worsen or if the condition fails to improve as anticipated.  I provided 22 minutes of non-face-to-face time during this encounter.   Jonah Blue, MD

## 2020-07-28 NOTE — Telephone Encounter (Signed)
Call placed to [facility] to clarify DME that patient has received.  Spoke to Oliver who stated that the patient received the BiPAP 05/24/2020 and signed for the receipt of it when she was at the hospital The nebulizer was delivered to her doorstep 05/26/2020.   Veronica Castaneda said that she will send a message to [facility] requesting that they reach out to the patient with an interpreter to explain the use of the machines. This CM requested that the RT make a home visit or ask the patient to bring the machines to their retail store/office to make sure that the patient understands how to correctly use each machine.  This CM requested a call back from the  RT after speaking/ meeting with the patient

## 2020-08-05 ENCOUNTER — Telehealth: Payer: Self-pay | Admitting: Internal Medicine

## 2020-08-05 NOTE — Telephone Encounter (Signed)
-----   Message from [name] sent at 08/05/2020  1:05 PM EST ----- Regarding: Sleep Study I called  Sleep center  they said they call her once and she was  in the hospital but I schedule her an appointment for  09/13/2020 @ 8pm  they will sent her the info by mail  and also I call her and I leave a voice message .   ----- Message ----- From: Marcine Matar, MD Sent: 07/28/2020   1:10 PM EST To: [name]  Can you check into her referral for sleep study placed in 05/2020.  Reports she has not been called.

## 2020-08-09 ENCOUNTER — Telehealth: Payer: Self-pay | Admitting: Internal Medicine

## 2020-08-09 NOTE — Telephone Encounter (Signed)
1) Medication(s) Requested (by name): metFORMIN (GLUCOPHAGE) 500 MG tablet [[id number]]  And  furosemide (LASIX) 20 MG tablet [[id number]]    2) Pharmacy of Choice:   Mailed  3) Special Requests:   Approved medications will be sent to the pharmacy, we will reach out if there is an issue.  Requests made after 3pm may not be addressed until the following business day!  If a patient is unsure of the name of the medication(s) please note and ask patient to call back when they are able to provide all info, do not send to responsible party until all information is available!

## 2020-08-10 MED FILL — METFORMIN HCL 500 MG TABS: 500 | 30 days supply | Qty: 15 | Fill #2

## 2020-08-10 MED FILL — FUROSEMIDE 20 MG TABS: 20 | 30 days supply | Qty: 30 | Fill #2

## 2020-08-10 NOTE — Telephone Encounter (Signed)
Please refill if appropriate

## 2020-08-10 NOTE — Telephone Encounter (Signed)
Patient has refills in our pharmacy. I have placed these for her.

## 2020-08-15 ENCOUNTER — Ambulatory Visit: Payer: No Typology Code available for payment source | Admitting: Internal Medicine

## 2020-09-13 ENCOUNTER — Ambulatory Visit (HOSPITAL_BASED_OUTPATIENT_CLINIC_OR_DEPARTMENT_OTHER): Payer: MEDICAID | Attending: Internal Medicine | Admitting: Internal Medicine

## 2020-10-06 ENCOUNTER — Telehealth: Payer: Self-pay

## 2020-10-06 DIAGNOSIS — Z1231 Encounter for screening mammogram for malignant neoplasm of breast: Secondary | ICD-10-CM

## 2020-10-06 NOTE — Addendum Note (Signed)
Addended by: Jonah Blue B on: 10/06/2020 01:24 PM   Modules accepted: Orders

## 2020-10-06 NOTE — Telephone Encounter (Signed)
Will forward to pcp

## 2020-10-06 NOTE — Telephone Encounter (Signed)
Copied from CRM [id number]. Topic: Referral - Request for Referral >> Oct 05, 2020  3:26 PM Lyn Hollingshead D wrote: Has patient seen PCP for this complaint? Yes  *If NO, is insurance requiring patient see PCP for this issue before PCP can refer them? Referral for which specialty: Mammogram  Preferred provider/office: Any Reason for referral: Mammogram

## 2020-10-14 ENCOUNTER — Other Ambulatory Visit: Payer: Self-pay | Admitting: Obstetrics and Gynecology

## 2020-10-14 ENCOUNTER — Other Ambulatory Visit: Payer: Self-pay | Admitting: Internal Medicine

## 2020-10-14 ENCOUNTER — Ambulatory Visit: Payer: Self-pay | Admitting: *Deleted

## 2020-10-14 DIAGNOSIS — Z1231 Encounter for screening mammogram for malignant neoplasm of breast: Secondary | ICD-10-CM

## 2020-10-14 DIAGNOSIS — R7303 Prediabetes: Secondary | ICD-10-CM

## 2020-10-14 DIAGNOSIS — R600 Localized edema: Secondary | ICD-10-CM

## 2020-10-14 MED ORDER — METFORMIN HCL 500 MG PO TABS: 250.0000 mg | ORAL_TABLET | Freq: Every day | ORAL | 6 refills | Status: DC

## 2020-10-14 MED ORDER — FUROSEMIDE 20 MG PO TABS: 20.0000 mg | ORAL_TABLET | Freq: Every day | ORAL | 6 refills | Status: DC

## 2020-10-14 MED FILL — METFORMIN HCL 500 MG TABS: 500 | 30 days supply | Qty: 15 | Fill #0

## 2020-10-14 MED FILL — FUROSEMIDE 20 MG TABS: 20 | 30 days supply | Qty: 30 | Fill #0

## 2020-10-14 NOTE — Telephone Encounter (Signed)
Using [employer] [phone] Ricki Rodriguez for Spanish I returned call.  I hurt in my stomach where there is a hernia when I bend over to pick up something.  It's been there a year.  2021 Nov. I told them about it but they did not feel it while I was there in the office.      I can wait until my appt on 11/09/2020.  That's what the appt is for the hernia.  No surgical history of abd when asked.  I instructed her to go to the ED if the pain becomes severe or the hernia protrudes out or becomes sore, red or hard feeling.  She verbalized understanding to go to the ED if she has any of the mentioned symptoms.  (She has an appt 11/09/2020 with Dr. Georgian Co for the hernia).          Reason for Disposition  Previously diagnosed hernia    It was examined in Nov 2021 per pt but the doctor could not feel it.  She has an appt on 11/09/2020 with Dr. Sharon Seller for this issue. I instructed her to go to the ED if it becomes worse.  Answer Assessment - Initial Assessment Questions 1. ONSET:  "When did this first appear?"     A year ago on my abd. 2. APPEARANCE: "What does it look like?"     It is slightly sticking out.  When I was in the office there in Nov. 2021 I mentioned it but the doctor could not feel or find it. 3. SIZE: "How big is it?" (inches, cm or compare to coins, fruit)     Not asked 4. LOCATION: "Where exactly is the hernia located?"     abd 5. PATTERN: "Does the swelling come and go, or has it been constant since it started?"     It just hurts when I bend over to pick up something from the floor. 6. PAIN: "Is there any pain?" If Yes, ask: "How bad is it?"  (Scale 1-10; or mild, moderate, severe)     See above 7. DIAGNOSIS: "Have you been seen by a doctor for this?" "Did the doctor diagnose you as having a hernia?"     Yes in Nov. 2021 but the doctor could not feel it. 8. OTHER SYMPTOMS: "Do you have any other symptoms?" (e.g., fever, abdominal pain, vomiting)     No 9.  PREGNANCY: "Is there any chance you are pregnant?" "When was your last menstrual period?"     N/A due to age  Protocols used: HERNIA-A-AH

## 2020-10-14 NOTE — Telephone Encounter (Signed)
Patient requesting  metFORMIN (GLUCOPHAGE) 500 MG tablet and furosemide (LASIX) 20 MG tablet, informed please allow 48 to 72 hour turn around time. Patient contacted pharmacy and was unable to connect due to the lines being down.    [facility] - [place], [place] - [address] [employer] [address] Phone:  [phone]  Fax:  [phone]

## 2020-10-17 ENCOUNTER — Other Ambulatory Visit (HOSPITAL_COMMUNITY): Payer: Self-pay | Admitting: Family Medicine

## 2020-10-17 ENCOUNTER — Encounter (HOSPITAL_COMMUNITY): Payer: Self-pay

## 2020-10-17 ENCOUNTER — Ambulatory Visit (HOSPITAL_COMMUNITY)
Admission: EM | Admit: 2020-10-17 | Discharge: 2020-10-17 | Disposition: A | Discharge status: Home / Self Care | Payer: Self-pay | Attending: Family Medicine | Admitting: Family Medicine

## 2020-10-17 ENCOUNTER — Other Ambulatory Visit: Payer: Self-pay

## 2020-10-17 DIAGNOSIS — R109 Unspecified abdominal pain: Secondary | ICD-10-CM

## 2020-10-17 LAB — POCT URINALYSIS DIPSTICK, ED / UC
Bilirubin Urine: NEGATIVE
Glucose, UA: NEGATIVE mg/dL
Hgb urine dipstick: NEGATIVE
Ketones, ur: NEGATIVE mg/dL
Leukocytes,Ua: NEGATIVE
Nitrite: NEGATIVE
Protein, ur: NEGATIVE mg/dL
Specific Gravity, Urine: 1.01 (ref 1.005–1.030)
Urobilinogen, UA: 0.2 mg/dL (ref 0.0–1.0)
pH: 6 (ref 5.0–8.0)

## 2020-10-17 MED ORDER — NAPROXEN 500 MG PO TABS: 500.0000 mg | ORAL_TABLET | Freq: Two times a day (BID) | ORAL | 0 refills | Status: DC | PRN

## 2020-10-17 MED ORDER — CYCLOBENZAPRINE HCL 5 MG PO TABS: 5.0000 mg | ORAL_TABLET | Freq: Three times a day (TID) | ORAL | 0 refills | Status: DC | PRN

## 2020-10-17 NOTE — ED Triage Notes (Signed)
Pt c/o RLQ pain that radiates to mid back X 1 week. Pt states she started to have neck pain yesterday. She states she has been drinking teas to relieve the pain.

## 2020-10-18 MED FILL — ?NAPROXEN 500 MG TABS: 500 | 7 days supply | Qty: 15 | Fill #0

## 2020-10-18 MED FILL — CYCLOBENZAPRINE 5 MG TABLET: 5 | 5 days supply | Qty: 15 | Fill #0

## 2020-10-18 NOTE — ED Provider Notes (Signed)
[facility]    CSN: [id number] Arrival date & time: 10/17/20  1442      History   Chief Complaint Chief Complaint  Patient presents with  . Back Pain  . Neck Pain    HPI Veronica Castaneda is a 60 y.o. female.   Medical interpreter used today to facilitate visit with patient's consent. She is having about a week of right flank/lower back pain that radiates upward with movement. Denies known injury, hematuria, dysuria, N/V/D, constipation, fever, chills. States the pain was 10/10 but has been easing off the past day or two. Has had this once before and it resolved with teas and time but the teas this time have not been helping as much. No hx of GI issues known.     Past Medical History:  Diagnosis Date  . Asthma   . CHF (congestive heart failure) (HCC)   . Dyspnea   . Hyperlipidemia   . Pneumonia   . Sleep apnea     Patient Active Problem List   Diagnosis Date Noted  . Asthma exacerbation 05/22/2020  . Morbid obesity with body mass index (BMI) of 50.0 to 59.9 in adult [facility]) 05/22/2020  . Obesity hypoventilation syndrome (HCC) 05/22/2020  . OSA (obstructive sleep apnea) 05/22/2020  . RSV (respiratory syncytial virus infection) 05/22/2020  . Difficult airway for intubation 05/22/2020  . Acute respiratory failure with hypoxia (HCC) 05/12/2020  . Loud snoring 02/11/2020  . Daytime sleepiness 02/11/2020  . Screening breast examination 10/22/2019  . Prediabetes 08/18/2019  . Hyperlipidemia 08/18/2019  . Morbid obesity (HCC) 08/17/2019    Past Surgical History:  Procedure Laterality Date  . NO PAST SURGERIES    . No PAST SURGICAL HISTORY      OB History    Gravida  7   Para      Term      Preterm      AB      Living  4     SAB      IAB      Ectopic      Multiple      Live Births               Home Medications    Prior to Admission medications   Medication Sig Start Date End Date Taking? Authorizing Provider   cyclobenzaprine (FLEXERIL) 5 MG tablet Take 1 tablet (5 mg total) by mouth 3 (three) times daily as needed for muscle spasms. DO NOT DRINK ALCOHOL OR DRIVE WHILE TAKING THIS MEDICATION 10/17/20  Yes Particia Nearing, PA-C  naproxen (NAPROSYN) 500 MG tablet Take 1 tablet (500 mg total) by mouth 2 (two) times daily as needed. 10/17/20  Yes Particia Nearing, PA-C  albuterol (PROVENTIL) (2.5 MG/3ML) 0.083% nebulizer solution Take 3 mLs (2.5 mg total) by nebulization every 6 (six) hours as needed for wheezing or shortness of breath. 07/28/20   Marcine Matar, MD  albuterol (VENTOLIN HFA) 108 (90 Base) MCG/ACT inhaler Inhale 2 puffs into the lungs every 4 (four) hours as needed for wheezing or shortness of breath. 06/28/20   Marcine Matar, MD  Fluticasone-Umeclidin-Vilant (TRELEGY ELLIPTA) 100-62.5-25 MCG/INH AEPB Inhale 1 puff into the lungs daily. 07/28/20   Marcine Matar, MD  furosemide (LASIX) 20 MG tablet Take 1 tablet (20 mg total) by mouth daily. 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 Do not take more than 1 tablet per day 10/14/20   Marcine Matar, MD  metFORMIN (GLUCOPHAGE) 500 MG tablet Take 0.5 tablets (250 mg total) by mouth daily with breakfast. 10/14/20   Marcine Matar, MD  Multiple Vitamin (MULTIVITAMIN WITH MINERALS) TABS tablet Take 1 tablet by mouth daily. 05/24/20   Swayze, Ava, DO  polyethylene glycol (MIRALAX / GLYCOLAX) 17 g packet Take 17 g by mouth daily. 05/24/20   Swayze, Ava, DO    Family History Family History  Problem Relation Age of Onset  . Heart attack Mother   . Heart disease Mother     Social History Social History   Tobacco Use  . Smoking status: Never Smoker  . Smokeless tobacco: Never Used  Vaping Use  . Vaping Use: Never used  Substance Use Topics  . Alcohol use: Never  . Drug use: Never     Allergies   Patient has no known allergies.   Review of Systems Review of Systems PER HPi    Physical Exam Triage Vital  Signs ED Triage Vitals  Enc Vitals Group     BP 10/17/20 1612 131/81     Pulse Rate 10/17/20 1612 75     Resp 10/17/20 1612 15     Temp 10/17/20 1612 98.4 F (36.9 C)     Temp Source 10/17/20 1612 Oral     SpO2 10/17/20 1612 95 %     Weight --      Height --      Head Circumference --      Peak Flow --      Pain Score 10/17/20 1610 10     Pain Loc --      Pain Edu? --      Excl. in GC? --    No data found.  Updated Vital Signs BP 131/81 (BP Location: Left Arm)   Pulse 75   Temp 98.4 F (36.9 C) (Oral)   Resp 15   SpO2 95%   Visual Acuity Right Eye Distance:   Left Eye Distance:   Bilateral Distance:    Right Eye Near:   Left Eye Near:    Bilateral Near:     Physical Exam Vitals and nursing note reviewed.  Constitutional:      Appearance: Normal appearance. She is not ill-appearing.  HENT:     Head: Atraumatic.     Mouth/Throat:     Mouth: Mucous membranes are moist.     Pharynx: No oropharyngeal exudate or posterior oropharyngeal erythema.  Eyes:     Extraocular Movements: Extraocular movements intact.     Conjunctiva/sclera: Conjunctivae normal.  Cardiovascular:     Rate and Rhythm: Normal rate and regular rhythm.     Pulses: Normal pulses.     Heart sounds: Normal heart sounds.  Pulmonary:     Effort: Pulmonary effort is normal. No respiratory distress.     Breath sounds: Normal breath sounds. No wheezing or rales.  Abdominal:     General: Bowel sounds are normal. There is no distension.     Palpations: Abdomen is soft. There is no mass.     Tenderness: There is no abdominal tenderness. There is no guarding or rebound.     Hernia: No hernia is present.  Musculoskeletal:        General: Tenderness (right low back ttp laterally, appears worse with movement/ambulation) present. No swelling, deformity or signs of injury. Normal range of motion.     Cervical back: Normal range of motion and neck supple.     Comments: No midline spinal ttp  Skin:     General: Skin is warm and dry.     Findings: No erythema.  Neurological:     Mental Status: She is alert and oriented to person, place, and time.     Sensory: No sensory deficit.  Psychiatric:        Mood and Affect: Mood normal.        Thought Content: Thought content normal.        Judgment: Judgment normal.      UC Treatments / Results  Labs (all labs ordered are listed, but only abnormal results are displayed) Labs Reviewed  POCT URINALYSIS DIPSTICK, ED / UC    EKG   Radiology No results found.  Procedures Procedures (including critical care time)  Medications Ordered in UC Medications - No data to display  Initial Impression / Assessment and Plan / UC Course  I have reviewed the triage vital signs and the nursing notes.  Pertinent labs & imaging results that were available during my care of the patient were reviewed by me and considered in my medical decision making (see chart for details).     U/A negative, vitals and exam very reassuring today. Low suspicion for true intra abdominal cause, pain is reproducible, worse with movement, and improving with rest. Suspect muscle strain. Will treat with flexeril, naproxen, rest, stretches, heat and recommended close PCP f/u for recheck. Return for acutely worsening sxs.   Final Clinical Impressions(s) / UC Diagnoses   Final diagnoses:  Right flank pain   Discharge Instructions   None    ED Prescriptions    Medication Sig Dispense Auth. Provider   cyclobenzaprine (FLEXERIL) 5 MG tablet Take 1 tablet (5 mg total) by mouth 3 (three) times daily as needed for muscle spasms. DO NOT DRINK ALCOHOL OR DRIVE WHILE TAKING THIS MEDICATION 15 tablet Particia Nearing, PA-C   naproxen (NAPROSYN) 500 MG tablet Take 1 tablet (500 mg total) by mouth 2 (two) times daily as needed. 15 tablet Particia Nearing, [place]     PDMP not reviewed this encounter.   Roosvelt Maser [place], [place] 10/18/20 [phone]

## 2020-11-01 ENCOUNTER — Ambulatory Visit: Payer: Self-pay

## 2020-11-09 ENCOUNTER — Other Ambulatory Visit: Payer: Self-pay

## 2020-11-09 ENCOUNTER — Ambulatory Visit: Payer: Self-pay | Attending: Physician Assistant | Admitting: Physician Assistant

## 2020-11-09 ENCOUNTER — Other Ambulatory Visit: Payer: Self-pay | Admitting: Family Medicine

## 2020-11-09 ENCOUNTER — Encounter: Payer: Self-pay | Admitting: Physician Assistant

## 2020-11-09 VITALS — BP 108/72 | HR 59 | Resp 16 | Wt 240.2 lb

## 2020-11-09 DIAGNOSIS — K439 Ventral hernia without obstruction or gangrene: Secondary | ICD-10-CM

## 2020-11-09 DIAGNOSIS — R7303 Prediabetes: Secondary | ICD-10-CM

## 2020-11-09 LAB — GLUCOSE, POCT (MANUAL RESULT ENTRY): POC Glucose: 102 mg/dl — AB (ref 70–99)

## 2020-11-09 MED FILL — METFORMIN HCL 500 MG TABS: 500 | 30 days supply | Qty: 15 | Fill #1

## 2020-11-09 NOTE — Patient Instructions (Signed)
Hernia ventral Ventral Hernia  Una hernia ventral es un bulto de tejido del interior del abdomen que empuja y sale a travs de una zona dbil de los msculos que forman la pared abdominal frontal. Los tejidos dentro del abdomen se encuentran en un saco (peritoneo). Estos tejidos incluyen el intestino delgado, el intestino grueso y el tejido graso que recubre el intestino (epipln). Algunas veces, el bulto que forma una hernia contiene intestinos. Otras hernias contienen solo grasa. Las hernias ventrales no desaparecen sin tratamiento quirrgico. Hay varios tipos de hernias ventrales. Es posible que tenga lo siguiente:  Una hernia en el lugar de una incisin de [place] ciruga abdominal previa (hernia incisional).  Una hernia justo por encima del ombligo (hernia epigstrica), o en el ombligo (hernia umbilical). Estos tipos de hernias pueden desarrollarse por levantar objetos pesados o hacer mucha fuerza.  Una hernia que aparece y desaparece (hernia reducible). Puede ser visible solamente cuando usted levanta algo o hace fuerza. Este tipo de hernia se puede reintroducir en el abdomen (reducir).  Una hernia que aprisiona tejido abdominal (hernia encarcelada). Este tipo de hernia es irreducible.  Una hernia que interrumpe el flujo de sangre a los tejidos en su interior (hernia estrangulada). Si esto ocurre, los tejidos Mining engineer a Furniture conservator/restorer. Se trata de un bulto muy doloroso e irreducible. Una hernia estrangulada es una emergencia mdica. Cules son las causas? Esta afeccin sucede cuando el tejido abdominal presiona una zona dbil de los msculos abdominales. Qu incrementa el riesgo? Los siguientes factores pueden hacer que sea ms propenso a Aeronautical engineer afeccin:  Ser mayor de 60 aos de edad.  Tener sobrepeso u obesidad.  Haberse sometido a ciruga abdominal en el pasado, especialmente si se produjo una infeccin luego de la [place].  Haber sufrido una lesin en la pared  abdominal.  Levantar o empujar con frecuencia objetos pesados.  Haber tenido varios embarazos.  Tener una acumulacin de lquido dentro del abdomen (ascitis).  Hacer esfuerzo para Magazine features editor.  Tener episodios frecuentes de tos. Cules son los signos o sntomas? El nico sntoma de una hernia ventral puede ser un bulto indoloro en el abdomen. Una hernia reducible puede ser visible solo al [employer], toser o Astronomer. Otros sntomas pueden incluir lo siguiente:  Dolor sordo.  Sensacin de opresin. Los signos y sntomas de una hernia estrangulada pueden incluir los siguientes:  Aumento del Engineer, mining.  Nuseas y vmitos.  Dolor al ejercer presin sobre la hernia.  La piel que recubre la hernia se torna roja o violcea.  Estreimiento.  Sangre en la materia fecal (heces). Cmo se diagnostica? Esta afeccin se puede diagnosticar en funcin de lo siguiente:  Sus sntomas.  Sus antecedentes mdicos.  Un examen fsico. Pueden pedirle que tosa o que haga fuerza mientras est de pie. Estas acciones aumentan la presin dentro del abdomen y empujan la hernia a travs de la abertura en los msculos. El mdico puede intentar reducir la hernia empujndola suavemente hacia dentro.  Estudios de diagnstico por imgenes, como una ecografa o una exploracin por tomografa computarizada (TC). Cmo se trata? Esta afeccin se trata con ciruga. Si la hernia est estrangulada, la ciruga se realiza lo antes posible. Si la hernia es pequea y no est encarcelada, es posible que se le pida que pierda algo de peso antes de la [place]. Siga estas instrucciones en su casa:  Siga las instrucciones del mdico respecto de las restricciones de comidas o bebidas.  Si tiene sobrepeso, el [employer]  recomendarle que aumente su nivel de [place] y que coma una dieta saludable.  No levante ningn objeto que pese ms de10 libras (4.5kg) o que supere el lmite de peso que le hayan  indicado, hasta que el mdico le diga que puede [place].  Retome sus actividades normales segn lo indicado por el mdico. Pregntele al mdico qu actividades son seguras para usted. Tal vez deba evitar las actividades que aumentan la presin sobre la hernia.  Use los medicamentos de venta libre y los recetados solamente como se lo haya indicado el mdico.  Cumpla con todas las visitas de seguimiento. Esto es importante. Comunquese con un mdico si:  La hernia se agranda.  La hernia le causa dolor. Solicite ayuda de inmediato si:  La hernia duele cada vez ms.  Tiene dolor junto con cualquiera de los siguientes sntomas: ? Cambios en el color de la piel en la zona de la hernia. ? Nuseas. ? Vmitos. ? Fiebre. Estos sntomas pueden representar un problema grave que constituye Radio broadcast assistant. No espere a ver si los sntomas desaparecen. Solicite atencin mdica de inmediato. Comunquese con el servicio de emergencias de su localidad (911 en los [place]). No conduzca por sus propios medios [employer] hospital. Resumen  Una hernia ventral es un bulto de tejido del interior del abdomen que empuja y sale a travs de una zona dbil de los msculos que forman la pared abdominal frontal.  Esta afeccin se trata con ciruga, que puede ser de urgencia segn la hernia.  No levante nada que pese ms de 10 libras (4.5 kg), y [address] las indicaciones del mdico sobre la [place]. Esta informacin no tiene Theme park manager el consejo del mdico. Asegrese de hacerle al mdico cualquier pregunta que tenga. Document Revised: 07/15/2020 Document Reviewed: 07/15/2020 Elsevier Patient Education  2021 [employer].

## 2020-11-09 NOTE — Progress Notes (Signed)
Patient ID: Veronica Castaneda, female   DOB: 07-02-1961, 60 y.o.   MRN: [id number]     Veronica Castaneda, is a 60 y.o. female  TDH:[id number]  MIW:[id number]  DOB - 20-May-1961  Subjective:  Chief Complaint and HPI: Veronica Castaneda is a 60 y.o. female here today to establish after being seen for a hernia in the ED 10/17/2020.  She says she has had this for about 1-2 years.  Seems to be getting bigger.  Worse with lifting things or protrudes with occasional cough.  No change in appetite or stools  Domingo Cocking with [employer] interpreters translating  ROS:   Constitutional:  No f/c, No night sweats, No unexplained weight loss. EENT:  No vision changes, No blurry vision, No hearing changes. No mouth, throat, or ear problems.  Respiratory: No cough, No SOB Cardiac: No CP, no palpitations GI:  See above, No N/V/D. GU: No Urinary s/sx Musculoskeletal: No joint pain Neuro: No headache, no dizziness, no motor weakness.  Skin: No rash Endocrine:  No polydipsia. No polyuria.  Psych: Denies SI/HI  No problems updated.  ALLERGIES: No Known Allergies  PAST MEDICAL HISTORY: Past Medical History:  Diagnosis Date  . Asthma   . CHF (congestive heart failure) (HCC)   . Dyspnea   . Hyperlipidemia   . Pneumonia   . Sleep apnea     MEDICATIONS AT HOME: Prior to Admission medications   Medication Sig Start Date End Date Taking? Authorizing Provider  albuterol (PROVENTIL) (2.5 MG/3ML) 0.083% nebulizer solution Take 3 mLs (2.5 mg total) by nebulization every 6 (six) hours as needed for wheezing or shortness of breath. 07/28/20   Marcine Matar, MD  albuterol (VENTOLIN HFA) 108 (90 Base) MCG/ACT inhaler Inhale 2 puffs into the lungs every 4 (four) hours as needed for wheezing or shortness of breath. 06/28/20   Marcine Matar, MD  cyclobenzaprine (FLEXERIL) 5 MG tablet Take 1 tablet (5 mg total) by mouth 3 (three) times daily as needed for muscle spasms. DO NOT DRINK  ALCOHOL OR DRIVE WHILE TAKING THIS MEDICATION 10/17/20   Particia Nearing, PA-C  Fluticasone-Umeclidin-Vilant (TRELEGY ELLIPTA) 100-62.5-25 MCG/INH AEPB Inhale 1 puff into the lungs daily. 07/28/20   Marcine Matar, MD  furosemide (LASIX) 20 MG tablet Take 1 tablet (20 mg total) by mouth daily. 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 Do not take more than 1 tablet per day 10/14/20   Marcine Matar, MD  metFORMIN (GLUCOPHAGE) 500 MG tablet Take 0.5 tablets (250 mg total) by mouth daily with breakfast. 10/14/20   Marcine Matar, MD  Multiple Vitamin (MULTIVITAMIN WITH MINERALS) TABS tablet Take 1 tablet by mouth daily. 05/24/20   Swayze, Ava, DO  naproxen (NAPROSYN) 500 MG tablet Take 1 tablet (500 mg total) by mouth 2 (two) times daily as needed. 10/17/20   Particia Nearing, PA-C  polyethylene glycol (MIRALAX / GLYCOLAX) 17 g packet Take 17 g by mouth daily. 05/24/20   Swayze, Ava, DO     Objective:  EXAM:   Vitals:   11/09/20 0953  BP: 108/72  Pulse: (!) 59  Resp: 16  SpO2: 95%  Weight: 240 lb 3.2 oz (109 kg)    General appearance : A&OX3. NAD. Non-toxic-appearing HEENT: Atraumatic and Normocephalic.  PERRLA. EOM intact.   Chest/Lungs:  Breathing-non-labored, Good air entry bilaterally, breath sounds normal without rales, rhonchi, or wheezing  CVS: S1 S2 regular, no murmurs, gallops, rubs  Abdomen: Bowel sounds present, Non tender  and not distended with no gaurding, rigidity or rebound.  Large ventral hernia that is reducible above the umbilicus.   Extremities: Bilateral Lower Ext shows no edema, both legs are warm to touch with = pulse throughout Neurology:  CN II-XII grossly intact, Non focal.   Psych:  TP linear. J/I WNL. Normal speech. Appropriate eye contact and affect.  Skin:  No Rash  Data Review Lab Results  Component Value Date   HGBA1C 6.4 (H) 05/12/2020   HGBA1C 5.8 02/11/2020   HGBA1C 6.2 (H) 08/17/2019     Assessment & Plan   1.  Prediabetes Stable.  No changes - Glucose (CBG)  2. Ventral hernia without obstruction or gangrene To ED if worsens or progresses.  Patient verbalizes understanding - Ambulatory referral to General Surgery   Patient have been counseled extensively about nutrition and exercise  Return for keep appt in march with Dr Laural Benes.  The patient was given clear instructions to go to ER or return to medical center if symptoms don't improve, worsen or new problems develop. The patient verbalized understanding. The patient was told to call to get lab results if they haven't heard anything in the next week.     Georgian Co, PA-C [facility] [place], [place] [phone]   11/09/2020, 10:09 AM

## 2020-11-17 ENCOUNTER — Ambulatory Visit: Payer: Self-pay

## 2020-11-24 ENCOUNTER — Other Ambulatory Visit: Payer: Self-pay | Admitting: Internal Medicine

## 2020-11-24 ENCOUNTER — Encounter: Payer: Self-pay | Admitting: Internal Medicine

## 2020-11-24 ENCOUNTER — Other Ambulatory Visit: Payer: Self-pay

## 2020-11-24 ENCOUNTER — Ambulatory Visit: Payer: Self-pay | Attending: Internal Medicine | Admitting: Internal Medicine

## 2020-11-24 VITALS — BP 120/80 | HR 84 | Resp 16 | Wt 240.0 lb

## 2020-11-24 DIAGNOSIS — R1033 Periumbilical pain: Secondary | ICD-10-CM

## 2020-11-24 DIAGNOSIS — J45998 Other asthma: Secondary | ICD-10-CM

## 2020-11-24 DIAGNOSIS — F458 Other somatoform disorders: Secondary | ICD-10-CM

## 2020-11-24 DIAGNOSIS — Z1211 Encounter for screening for malignant neoplasm of colon: Secondary | ICD-10-CM

## 2020-11-24 MED ORDER — TRELEGY ELLIPTA 100-62.5-25 MCG/INH IN AEPB: 1.0000 | INHALATION_SPRAY | Freq: Every day | RESPIRATORY_TRACT | 11 refills | Status: DC

## 2020-11-24 MED ORDER — ALBUTEROL SULFATE (2.5 MG/3ML) 0.083% IN NEBU: 2.5000 mg | INHALATION_SOLUTION | Freq: Four times a day (QID) | RESPIRATORY_TRACT | 6 refills | Status: DC | PRN

## 2020-11-24 MED FILL — ALBUTEROL 0.083% INHAL SOLN: (2.5 MG/3ML | 6 days supply | Qty: 75 | Fill #0

## 2020-11-24 MED FILL — !TRELEGY ELLIPTA 100-62.5-2: 100-62.5-25 | 30 days supply | Qty: 60 | Fill #0

## 2020-11-24 NOTE — Progress Notes (Signed)
Patient ID: Veronica Castaneda, female    DOB: 1961/07/20  MRN: [id number]  CC: Asthma and Prediabetes   Subjective: Veronica Castaneda is a 60 y.o. female who presents for chronic ds management Her concerns today include:  Pt withmorbidobesity, OA LT knee, likely OSA/OHS, prediabetes, mixed HL, persistent asthma.  Saw PA on last visit for periumbilical hernia that has been present x 2 yrs.  Referred to surgeon but no appt as yet Pain when she lifts heavy things and in cold weather. Also pain around the area after meals.   On last visit with me, I was trying to determine whether she had a BiPAP machine at home.  The CW found out that she had both BIPAP and neb machine. CW spoke with [employer] and requested that a tech go out to her home and teach use of both machines.  Pt states she was called and they said they were sending someone out but no one ever came Today she confirms that she has 2 machines at home but was only using "the one with Albuterol."  She stopped using it because of cold weather and it made her mouth dry. -sleep study was rescheduled for her by our referral coordinator for 08/2020.  Pt states she did not go because she had an allergy.  She did not reschedule.  I think that was our third attempt of trying to get her for sleep study.  Asthma:  Reports compliance with Trelegy inhaler.  Using it twice a day.  Using Albuterol once a day No recent flair of asthma  PreDM/Obesity:  Taking Metformin Stop eating bread, fatty foods, rice, potatoe, pasta, sweets.  Drinks mainly water and a little juice, tea. Loss 20 lbs since 05/2020.   Patient Active Problem List   Diagnosis Date Noted  . Asthma exacerbation 05/22/2020  . Morbid obesity with body mass index (BMI) of 50.0 to 59.9 in adult [facility]) 05/22/2020  . Obesity hypoventilation syndrome (HCC) 05/22/2020  . OSA (obstructive sleep apnea) 05/22/2020  . RSV (respiratory syncytial virus infection) 05/22/2020   . Difficult airway for intubation 05/22/2020  . Acute respiratory failure with hypoxia (HCC) 05/12/2020  . Loud snoring 02/11/2020  . Daytime sleepiness 02/11/2020  . Screening breast examination 10/22/2019  . Prediabetes 08/18/2019  . Hyperlipidemia 08/18/2019  . Morbid obesity (HCC) 08/17/2019     Current Outpatient Medications on File Prior to Visit  Medication Sig Dispense Refill  . albuterol (PROVENTIL) (2.5 MG/3ML) 0.083% nebulizer solution Take 3 mLs (2.5 mg total) by nebulization every 6 (six) hours as needed for wheezing or shortness of breath. 75 mL 4  . albuterol (VENTOLIN HFA) 108 (90 Base) MCG/ACT inhaler Inhale 2 puffs into the lungs every 4 (four) hours as needed for wheezing or shortness of breath. 8 g 6  . cyclobenzaprine (FLEXERIL) 5 MG tablet Take 1 tablet (5 mg total) by mouth 3 (three) times daily as needed for muscle spasms. DO NOT DRINK ALCOHOL OR DRIVE WHILE TAKING THIS MEDICATION 15 tablet 0  . Fluticasone-Umeclidin-Vilant (TRELEGY ELLIPTA) 100-62.5-25 MCG/INH AEPB Inhale 1 puff into the lungs daily. 1 each 2  . furosemide (LASIX) 20 MG tablet Take 1 tablet (20 mg total) by mouth daily. 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 Do not take more than 1 tablet per day 30 tablet 6  . metFORMIN (GLUCOPHAGE) 500 MG tablet Take 0.5 tablets (250 mg total) by mouth daily with breakfast. 30 tablet 6  . Multiple Vitamin (MULTIVITAMIN  WITH MINERALS) TABS tablet Take 1 tablet by mouth daily. 30 tablet 0  . naproxen (NAPROSYN) 500 MG tablet Take 1 tablet (500 mg total) by mouth 2 (two) times daily as needed. (Patient not taking: Reported on 11/24/2020) 15 tablet 0  . polyethylene glycol (MIRALAX / GLYCOLAX) 17 g packet Take 17 g by mouth daily. 14 each 0   No current facility-administered medications on file prior to visit.    No Known Allergies  Social History   Socioeconomic History  . Marital status: Single    Spouse name: Not on file  . Number of children: 3  . Years  of education: Not on file  . Highest education level: 2nd grade  Occupational History  . Occupation: house wife  Tobacco Use  . Smoking status: Never Smoker  . Smokeless tobacco: Never Used  Vaping Use  . Vaping Use: Never used  Substance and Sexual Activity  . Alcohol use: Never  . Drug use: Never  . Sexual activity: Not Currently  Other Topics Concern  . Not on file  Social History Narrative  . Not on file   Social Determinants of Health   Financial Resource Strain: Not on file  Food Insecurity: Not on file  Transportation Needs: Not on file  Physical Activity: Not on file  Stress: Not on file  Social Connections: Not on file  Intimate Partner Violence: Not on file    Family History  Problem Relation Age of Onset  . Heart attack Mother   . Heart disease Mother     Past Surgical History:  Procedure Laterality Date  . NO PAST SURGERIES    . No PAST SURGICAL HISTORY      ROS: Review of Systems Negative except as stated above  PHYSICAL EXAM: BP 120/80   Pulse 84   Resp 16   Wt 240 lb (108.9 kg)   SpO2 96%   BMI 46.87 kg/m   Wt Readings from Last 3 Encounters:  11/24/20 240 lb (108.9 kg)  11/09/20 240 lb 3.2 oz (109 kg)  05/27/20 252 lb (114.3 kg)  pt pox on room air  Physical Exam  General appearance - alert, well appearing, morbidly obese middle-age Hispanic female and in no distress Mental status - normal mood, behavior, speech, dress, motor activity, and thought processes Chest - clear to auscultation, no wheezes, rales or rhonchi, symmetric air entry Heart - normal rate, regular rhythm, normal S1, S2, no murmurs, rubs, clicks or gallops Abdomen -obese.  Normal bowel sounds.  Soft.  Nontender.  She appears to have bulging of her hernia just above and slightly to the right of the umbilicus with Valsalva maneuver. Extremities - peripheral pulses normal, no pedal edema, no clubbing or cyanosis   CMP Latest Ref Rng & Units 05/27/2020 05/21/2020 05/20/2020   Glucose 65 - 99 mg/dL 528(U) 132(G) -  BUN 6 - 24 mg/dL 9 40(N) -  Creatinine 0.27 - 1.00 mg/dL 2.53(G) 6.44 -  Sodium 134 - 144 mmol/L 140 145 146(H)  Potassium 3.5 - 5.2 mmol/L 4.2 3.5 4.2  Chloride 96 - 106 mmol/L 102 110 -  CO2 20 - 29 mmol/L 24 25 -  Calcium 8.7 - 10.2 mg/dL 9.1 9.1 -  Total Protein 6.5 - 8.1 g/dL - - -  Total Bilirubin 0.3 - 1.2 mg/dL - - -  Alkaline Phos 38 - 126 U/L - - -  AST 15 - 41 U/L - - -  ALT 0 - 44 U/L - - -  Lipid Panel     Component Value Date/Time   CHOL 278 (H) 08/17/2019 1144   TRIG 216 (H) 05/15/2020 1643   HDL 46 08/17/2019 1144   CHOLHDL 6.0 (H) 08/17/2019 1144   LDLCALC 170 (H) 08/17/2019 1144    CBC    Component Value Date/Time   WBC 7.4 05/17/2020 0429   RBC 4.59 05/17/2020 0429   HGB 14.3 05/20/2020 1226   HGB 14.3 08/17/2019 1144   HCT 42.0 05/20/2020 1226   HCT 43.7 08/17/2019 1144   PLT 149 (L) 05/17/2020 0429   PLT 220 08/17/2019 1144   MCV 94.3 05/17/2020 0429   MCV 94 08/17/2019 1144   MCH 27.9 05/17/2020 0429   MCHC 29.6 (L) 05/17/2020 0429   RDW 14.9 05/17/2020 0429   RDW 11.9 08/17/2019 1144   LYMPHSABS 1.0 05/12/2020 0654   MONOABS 1.0 05/12/2020 0654   EOSABS 0.1 05/12/2020 0654   BASOSABS 0.0 05/12/2020 0654    ASSESSMENT AND PLAN:  1. Asthma, persistent controlled Controlled on trilogy.  Refill sent to the pharmacy.  Went over the difference between maintenance inhaler versus rescue inhaler. - Fluticasone-Umeclidin-Vilant (TRELEGY ELLIPTA) 100-62.5-25 MCG/INH AEPB; Inhale 1 puff into the lungs daily.  Dispense: 1 each; Refill: 11 - albuterol (PROVENTIL) (2.5 MG/3ML) 0.083% nebulizer solution; Take 3 mLs (2.5 mg total) by nebulization every 6 (six) hours as needed for wheezing or shortness of breath.  Dispense: 75 mL; Refill: 6  2. Morbid obesity (HCC) Commended her on weight loss.  Encouraged her to keep up the good work with healthy eating habits.  Encouraged her to try to move more. - CBC -  Comprehensive metabolic panel - Hemoglobin A1c - Lipid panel  3. Hyperventilation syndrome With likely OSA. Message sent to CW to let her know that [employer] [facility] has not been out to the patient's home to give teaching on use of the BiPAP machine. I will hold off on trying to reschedule her again for sleep study as patient does not really seem interested in doing this.  4. Screening for colon cancer Discussed need for colon cancer screening.  She is agreeable to doing the fit test. - Fecal occult blood, imunochemical([facility])  5. Periumbilical abdominal pain We will get an abdominal ultrasound to verify that she has a hernia there in anticipation of her seeing the surgeon.   - CT Abdomen Pelvis W Contrast; Future   Patient was given the opportunity to ask questions.  Patient verbalized understanding of the plan and was able to repeat key elements of the plan.  [employer] Language interpreter used during this encounter. Mafer #[id number]  No orders of the defined types were placed in this encounter.    Requested Prescriptions    No prescriptions requested or ordered in this encounter    No follow-ups on file.  Jonah Blue, MD, FACP

## 2020-11-25 ENCOUNTER — Other Ambulatory Visit: Payer: Self-pay | Admitting: Internal Medicine

## 2020-11-25 LAB — COMPREHENSIVE METABOLIC PANEL
ALT: 10 IU/L (ref 0–32)
AST: 13 IU/L (ref 0–40)
Albumin/Globulin Ratio: 1.3 (ref 1.2–2.2)
Albumin: 4.1 g/dL (ref 3.8–4.9)
Alkaline Phosphatase: 126 IU/L — ABNORMAL HIGH (ref 44–121)
BUN/Creatinine Ratio: 19 (ref 9–23)
BUN: 11 mg/dL (ref 6–24)
Bilirubin Total: 0.3 mg/dL (ref 0.0–1.2)
CO2: 26 mmol/L (ref 20–29)
Calcium: 9 mg/dL (ref 8.7–10.2)
Chloride: 97 mmol/L (ref 96–106)
Creatinine, Ser: 0.59 mg/dL (ref 0.57–1.00)
Globulin, Total: 3.1 g/dL (ref 1.5–4.5)
Glucose: 81 mg/dL (ref 65–99)
Potassium: 4.5 mmol/L (ref 3.5–5.2)
Sodium: 137 mmol/L (ref 134–144)
Total Protein: 7.2 g/dL (ref 6.0–8.5)
eGFR: 105 mL/min/{1.73_m2} (ref >59)

## 2020-11-25 LAB — CBC
Hematocrit: 44.8 % (ref 34.0–46.6)
Hemoglobin: 14.8 g/dL (ref 11.1–15.9)
MCH: 31.8 pg (ref 26.6–33.0)
MCHC: 33 g/dL (ref 31.5–35.7)
MCV: 96 fL (ref 79–97)
Platelets: 221 10*3/uL (ref 150–450)
RBC: 4.66 x10E6/uL (ref 3.77–5.28)
RDW: 12.1 % (ref 11.7–15.4)
WBC: 6.7 10*3/uL (ref 3.4–10.8)

## 2020-11-25 LAB — LIPID PANEL
Chol/HDL Ratio: 6.7 ratio — ABNORMAL HIGH (ref 0.0–4.4)
Cholesterol, Total: 276 mg/dL — ABNORMAL HIGH (ref 100–199)
HDL: 41 mg/dL (ref >39)
LDL Chol Calc (NIH): 177 mg/dL — ABNORMAL HIGH (ref 0–99)
Triglycerides: 298 mg/dL — ABNORMAL HIGH (ref 0–149)
VLDL Cholesterol Cal: 58 mg/dL — ABNORMAL HIGH (ref 5–40)

## 2020-11-25 LAB — HEMOGLOBIN A1C
Est. average glucose Bld gHb Est-mCnc: 117 mg/dL
Hgb A1c MFr Bld: 5.7 % — ABNORMAL HIGH (ref 4.8–5.6)

## 2020-11-25 MED ORDER — ATORVASTATIN CALCIUM 10 MG PO TABS: 10.0000 mg | ORAL_TABLET | Freq: Every day | ORAL | 2 refills | Status: DC

## 2020-11-25 NOTE — Progress Notes (Signed)
Let patient know that her blood cell counts including red blood cell, white blood cell and platelet counts are normal.  Kidney and liver function tests are normal.  She is still in the range for prediabetes.  Cholesterol levels still elevated.  Healthy eating habits and trying to move more will help prevent progression to diabetes and will also help to lower cholesterol.  Cholesterol increases the risk for heart attack and strokes.  I recommend starting a medication called atorvastatin to help lower cholesterol.  I have sent the medication to the pharmacy for her to pick up.

## 2020-11-26 LAB — FECAL OCCULT BLOOD, IMMUNOCHEMICAL: Fecal Occult Bld: NEGATIVE

## 2020-11-28 MED FILL — ATORVASTATIN 10 MG TABLET: 10 | 30 days supply | Qty: 30 | Fill #0

## 2020-12-01 ENCOUNTER — Ambulatory Visit (HOSPITAL_COMMUNITY)
Admission: RE | Admit: 2020-12-01 | Discharge: 2020-12-01 | Disposition: A | Discharge status: Home / Self Care | Payer: Self-pay | Source: Ambulatory Visit | Attending: Internal Medicine | Admitting: Internal Medicine

## 2020-12-01 ENCOUNTER — Other Ambulatory Visit: Payer: Self-pay

## 2020-12-01 DIAGNOSIS — R1033 Periumbilical pain: Secondary | ICD-10-CM | POA: Insufficient documentation

## 2020-12-01 MED ORDER — IOHEXOL 300 MG/ML  SOLN
100.0000 mL | Freq: Once | INTRAMUSCULAR | Status: AC | PRN
  Administered 2020-12-01: 100 mL via INTRAVENOUS

## 2020-12-06 ENCOUNTER — Telehealth: Payer: Self-pay

## 2020-12-06 ENCOUNTER — Other Ambulatory Visit: Payer: Self-pay | Admitting: Family Medicine

## 2020-12-06 DIAGNOSIS — K429 Umbilical hernia without obstruction or gangrene: Secondary | ICD-10-CM

## 2020-12-06 NOTE — Telephone Encounter (Signed)
[employer] interpre[employer] David  Id# [id number]  contacted pt to go over lab results pt didn't answer lvm  

## 2020-12-08 ENCOUNTER — Other Ambulatory Visit: Payer: Self-pay

## 2020-12-08 ENCOUNTER — Ambulatory Visit
Admission: RE | Admit: 2020-12-08 | Discharge: 2020-12-08 | Disposition: A | Discharge status: Home / Self Care | Payer: No Typology Code available for payment source | Source: Ambulatory Visit | Attending: Obstetrics and Gynecology | Admitting: Obstetrics and Gynecology

## 2020-12-08 ENCOUNTER — Ambulatory Visit: Payer: No Typology Code available for payment source | Admitting: *Deleted

## 2020-12-08 VITALS — BP 110/68 | Wt 236.8 lb

## 2020-12-08 DIAGNOSIS — Z1239 Encounter for other screening for malignant neoplasm of breast: Secondary | ICD-10-CM

## 2020-12-08 DIAGNOSIS — Z1231 Encounter for screening mammogram for malignant neoplasm of breast: Secondary | ICD-10-CM

## 2020-12-08 NOTE — Progress Notes (Signed)
Veronica Castaneda is a 60 y.o. female who presents to [facility] clinic today with no complaints.    Pap Smear: Pap smear not completed today. Last Pap smear was 08/17/2019 and ASCUS with negative HPV and BV. Per patient her last Pap smear is the only abnormal Pap smear she has had. Per patients provider Dr. Jonah Blue recommended next Pap smear in 3 years. Last Pap smear result is in Epic.   Physical exam: Breasts Breasts symmetrical. No skin abnormalities bilateral breasts. No nipple retraction bilateral breasts. No nipple discharge bilateral breasts. No lymphadenopathy. No lumps palpated bilateral breasts. No complaints of pain or tenderness on exam.      MS DIGITAL SCREENING TOMO BILATERAL  Result Date: 10/27/2019 CLINICAL DATA:  Screening. EXAM: DIGITAL SCREENING BILATERAL MAMMOGRAM WITH TOMO AND CAD COMPARISON:  None. ACR Breast Density Category b: There are scattered areas of fibroglandular density. FINDINGS: There are no findings suspicious for malignancy. Images were processed with CAD. IMPRESSION: No mammographic evidence of malignancy. A result letter of this screening mammogram will be mailed directly to the patient. RECOMMENDATION: Screening mammogram in one year. (Code:SM-B-01Y) BI-RADS CATEGORY  1: Negative. Electronically Signed   By: Baird Lyons M.D.   On: 10/27/2019 11:12   Pelvic/Bimanual Pap is not indicated today per BCCCP guidelines.   Smoking History: Patient has never smoked.   Patient Navigation: Patient education provided. Access to services provided for patient through [place] program. Spanish interpreter Natale Lay from [facility] provided.  Colorectal Cancer Screening: Per patient has never had colonoscopy completed. FIT Test completed 11/16/2019 and negative. No complaints today.    Breast and Cervical Cancer Risk Assessment: Patient does not have family history of breast cancer, known genetic mutations, or radiation treatment to the chest before age 5.  Patient does not have history of cervical dysplasia, immunocompromised, or DES exposure in-utero.  Risk Assessment    Risk Scores      12/08/2020 10/22/2019   Last edited by: Narda Rutherford, LPN McGill, Sherie Demetrius Charity, LPN   5-year risk: 1 % 0.9 %   Lifetime risk: 6.1 % 6.2 %          A: BCCCP exam without pap smear No complaints.  P: Referred patient to the [facility] for a screening mammogram on the mobile unit. Appointment scheduled Thursday, December 08, 2020 at 1500.  Priscille Heidelberg, RN 12/08/2020 2:19 PM

## 2020-12-08 NOTE — Patient Instructions (Signed)
Explained breast self awareness with Milus Height. Patient did not need a Pap smear today due to last Pap smear was 08/17/2019. Per patients provider next Pap smear is due in three years. Referred patient to the [facility] for a screening mammogram on the mobile unit. Appointment scheduled Thursday, December 08, 2020 at 1500. Patient escorted to the mobile unit following BCCCP appointment for her screening mammogram. Let patient know the Breast Center will follow up with her within the next couple weeks with results of her mammogram by letter or phone. Veronica Castaneda verbalized understanding.  Veronica Castaneda, Kathaleen Maser, RN 2:19 PM

## 2020-12-14 ENCOUNTER — Telehealth: Payer: Self-pay

## 2020-12-14 NOTE — Telephone Encounter (Signed)
Message sent to [facility] on 11/24/2020 requesting that their respiratory technician reach out to patient to schedule teaching for CPAP and nebulizer. Message sent again today to follow up with Melissa. She said that her team tried to contact the patient when the last message was received 11/24/2020, they left a message for her but had not heard back. They called her again today and scheduled an appointment to meet with her 12/29/2020.

## 2020-12-15 ENCOUNTER — Telehealth: Payer: Self-pay | Admitting: Internal Medicine

## 2020-12-15 NOTE — Telephone Encounter (Signed)
Copied from CRM [id number]. Topic: General - Inquiry >> Dec 14, 2020  4:03 PM Aretta Nip wrote: Pls call pt back to advise... Pt received a referral for surgery and received form that  will help with surgery cost, states letter approved  her and there was additional form to feel out, pt states on the form she was to call [facility] to sch surgery, Pt is using interpreter service and cannot get understanding from services to satisfy pt. Pt threw away envelope so we do not know where she is getting info.  Please call pt back with spanish int for pt to get clear understanding of the forms as she states that she is to schedule her surgery at [facility].  Call pt at [phone]

## 2020-12-16 NOTE — Telephone Encounter (Signed)
Veronica Castaneda could you reach out to pt and see what she is referring to

## 2021-01-03 ENCOUNTER — Other Ambulatory Visit (HOSPITAL_COMMUNITY): Payer: Self-pay

## 2021-01-04 ENCOUNTER — Other Ambulatory Visit: Payer: Self-pay

## 2021-01-04 ENCOUNTER — Ambulatory Visit: Payer: MEDICAID | Attending: Internal Medicine

## 2021-01-18 ENCOUNTER — Telehealth: Payer: Self-pay

## 2021-01-18 NOTE — Telephone Encounter (Signed)
Message received from [facility] noting that their respiratory team met with the patient 12/29/2020 for CPAP and nebulizer teaching.

## 2021-01-26 ENCOUNTER — Other Ambulatory Visit: Payer: Self-pay

## 2021-01-31 ENCOUNTER — Telehealth: Payer: Self-pay | Admitting: Internal Medicine

## 2021-01-31 DIAGNOSIS — Z0120 Encounter for dental examination and cleaning without abnormal findings: Secondary | ICD-10-CM

## 2021-01-31 NOTE — Telephone Encounter (Signed)
Pt spoke w/ Fin. Counselor  And was told to ask PCP for Referral for a Dentist for a cleaning. Please advise and thank you

## 2021-01-31 NOTE — Telephone Encounter (Signed)
FYI

## 2021-01-31 NOTE — Telephone Encounter (Signed)
She has not seen a surgeon yet and needs to for her Hernia.

## 2021-01-31 NOTE — Telephone Encounter (Signed)
Please contact pt.

## 2021-01-31 NOTE — Telephone Encounter (Signed)
Will forward to provider  

## 2021-01-31 NOTE — Telephone Encounter (Signed)
Pt wants PCP to know she just received the Financial Letter and [employer]. Pt stated she might be needing surgery and wanted PCP to know.

## 2021-02-08 NOTE — Telephone Encounter (Signed)
Pt called and stated that she has received her financial discount letter approval but it expires in October / Pt is now ready to have the referral/appt with surgeon  for her hernia / please advise

## 2021-02-13 NOTE — Telephone Encounter (Signed)
Noted  Placed in  [facility]. They will contact patient to schedule an appointment

## 2021-02-24 ENCOUNTER — Ambulatory Visit (INDEPENDENT_AMBULATORY_CARE_PROVIDER_SITE_OTHER): Payer: No Typology Code available for payment source | Admitting: Surgery

## 2021-02-24 ENCOUNTER — Encounter: Payer: Self-pay | Admitting: Surgery

## 2021-02-24 ENCOUNTER — Other Ambulatory Visit: Payer: Self-pay

## 2021-02-24 DIAGNOSIS — K429 Umbilical hernia without obstruction or gangrene: Secondary | ICD-10-CM

## 2021-02-24 NOTE — Patient Instructions (Addendum)
If you have any concerns or questions, please feel free to call our office. See follow up appointment below.        Hernia umbilical en los adultos Umbilical Hernia, Adult  Una hernia es un bulto de tejido que sobresale a travs de una abertura [employer]. Una hernia umbilical se produce en el abdomen, cerca del ombligo. La hernia puede contener tejidos del intestino delgado, el intestino grueso o tejido graso que recubre el intestino (epipln). Las hernias USAA en los adultos suelen empeorar con el tiempo y requieren tratamiento quirrgico. Hay varios tipos de hernias umbilicales. Es posible que tenga lo siguiente:  Una hernia ubicada justo por debajo o por arriba del ombligo (hernia indirecta). Es el tipo de hernia umbilical ms frecuente en los adultos.  Una hernia que se forma a travs de una abertura hecha por el ombligo (hernia directa).  Una hernia que aparece y desaparece (hernia reducible). Una hernia reducible puede ser visible solo al hacer fuerza, levantar objetos pesados o toser. Este tipo de hernia se puede reintroducir en el abdomen (reducir).  Una hernia que aprisiona tejido abdominal (hernia encarcelada). Este tipo de hernia es irreducible.  Una hernia que interrumpe el flujo de sangre a los tejidos en su interior (hernia estrangulada). Si esto ocurre, los tejidos Mining engineer a Furniture conservator/restorer. Este tipo de hernia requiere tratamiento de Associate Professor. Cules son las causas? Una hernia umbilical se produce cuando el tejido dentro del abdomen ejerce presin sobre una zona debilitada de los msculos abdominales. Qu incrementa el riesgo? Puede correr un mayor riesgo de Warehouse manager esta afeccin en los siguientes casos:  Tiene obesidad.  Tuvo varios embarazos.  Tiene una acumulacin de lquido dentro del abdomen (ascitis).  Se someti a una ciruga que [employer] abdominales. Cules son los signos o sntomas? El principal sntoma de esta afeccin es un bulto  en el ombligo o cerca de este que no causa dolor. Una hernia reducible puede ser visible solo al hacer fuerza, levantar objetos pesados o toser. Otros sntomas pueden incluir lo siguiente:  Dolor sordo.  Sensacin de opresin. Los sntomas de una hernia estrangulada pueden incluir los siguientes:  Dolor que se vuelve cada vez ms intenso.  Nuseas y vmitos.  Dolor al ejercer presin sobre la hernia.  Cambio de color de la piel que recubre la hernia que se torna roja o violcea.  Estreimiento.  Sangre en las heces. Cmo se diagnostica? Esta afeccin se puede diagnosticar en funcin de lo siguiente:  Un examen fsico. Pueden pedirle que tosa o que haga fuerza mientras est de pie. Estas acciones aumentan la presin dentro del abdomen y empujan la hernia a travs de la abertura en los msculos. El mdico puede ejercer presin sobre la hernia para tratar de reducirla.  Los sntomas y los antecedentes mdicos. Cmo se trata? La ciruga es el nico tratamiento para una hernia umbilical. En el caso de que la hernia est estrangulada, esta se realiza lo antes posible. Si tiene una hernia pequea que no est encarcelada, tal vez tenga que bajar de peso antes de la [place]. Siga estas indicaciones en su casa:  Baje de peso, si se lo indic el mdico.  No trate de reintroducir la hernia.  Observe si la hernia cambia de color o de tamao. Infrmele al mdico si se producen cambios.  Tal vez deba evitar las actividades que aumentan la presin sobre la hernia.  No levante objetos que pesen ms de 10libras (4.5kg) hasta que el mdico  le diga que es seguro.  Tome los medicamentos de venta libre y los recetados solamente como se lo haya indicado el mdico.  Oceanographer a todas las visitas de control como se lo haya indicado el mdico. Esto es importante. Comunquese con un mdico si:  La hernia se agranda.  La hernia le causa dolor. Solicite ayuda de inmediato si:  Tiene un dolor  intenso y repentino cerca de la zona de la hernia.  Tiene dolor, as como nuseas o vmitos.  Tiene dolor y la piel que recubre la hernia cambia de color.  Presenta fiebre. Esta informacin no tiene Theme park manager el consejo del mdico. Asegrese de hacerle al mdico cualquier pregunta que tenga. Document Revised: 12/23/2017 Document Reviewed: 06/17/2017 Elsevier Patient Education  2021 Elsevier Inc.    Recuento de caloras para bajar de peso Calorie Counting for [employer] Loss Las caloras son unidades de Engineer, drilling. El cuerpo necesita una cierta cantidad de caloras de los alimentos para que lo ayuden a funcionar durante todo Medical laboratory scientific officer. Cuando se comen o beben ms caloras de las que el cuerpo [place], este acumula las caloras adicionales mayormente como grasa. Cuando se comen o beben menos caloras de las que el cuerpo [place], este quema grasa para obtener la energa que necesita. El recuento de caloras es el registro de la cantidad de caloras que se comen y Audiological scientist. El recuento de caloras puede ser de ayuda si necesita perder peso. Si come menos caloras de las que el cuerpo necesita, debera bajar de [place]. Pregntele al mdico cul es un peso sano para usted. Para que el recuento de caloras funcione, usted tendr que ingerir la cantidad de caloras adecuadas cada da, para bajar una cantidad de peso saludable por semana. Un nutricionista puede ayudar a determinar la cantidad de caloras que usted necesita por da y sugerirle formas de Barista su objetivo calrico.  Neomia Dear cantidad de peso saludable para bajar cada semana suele ser [place] 1 y Enedina Finner (0.5 a 0.9kg). Esto habitualmente significa que su ingesta diaria de caloras se debera reducir en unas 500 a 750caloras.  Ingerir de 1200 a 1500caloras por da puede ayudar a la [employer]son de las mujeres a Publishing copy de [place].  Ingerir de 1500 a 1800caloras por da puede ayudar a la [employer]son de los hombres a Publishing copy de [place]. Qu debo  saber acerca del recuento de caloras? Trabaje con el mdico o el nutricionista para determinar cuntas caloras debe recibir Management consultant. A fin de alcanzar su objetivo diario de caloras, tendr que:  Averiguar cuntas caloras hay en cada alimento que le Lobbyist. Intente hacerlo antes de comer.  Decidir la cantidad que puede comer del alimento.  Llevar un registro de los alimentos. Para esto, anote lo que comi y cuntas caloras tena. Para perder peso con xito, es importante equilibrar el recuento de caloras con un estilo de vida saludable que incluya actividad fsica de forma regular. Dnde encuentro informacin sobre las caloras? Es posible Veterinary surgeon cantidad de caloras que contiene un alimento en la etiqueta de informacin nutricional. Si un alimento no tiene una etiqueta de informacin nutricional, intente buscar las caloras en Internet o pida ayuda al nutricionista. Recuerde que las caloras se calculan por porcin. Si opta por comer ms de una porcin de un alimento, tendr [employer] las caloras de una porcin por la cantidad de porciones que planea comer. Por ejemplo, la etiqueta de un envase de pan puede decir que el tamao de una porcin es  1rodaja, y que una porcin tiene 90caloras. Si come 1rodaja, habr comido 90caloras. Si come 2rodajas, habr comido 180caloras.   Cmo llevo un registro de comidas? Despus de cada vez que coma, anote lo siguiente en el registro de alimentos lo antes posible:  Lo que comi. Asegrese de [employer], las salsas y otros extras [employer].  La cantidad que comi. Esto se puede medir en tazas, onzas o cantidad de alimentos.  Cuntas caloras haba en cada alimento y en cada bebida.  La cantidad total de caloras en la comida que tom. Tenga a Scientific laboratory technician de alimentos, por ejemplo, en un anotador de bolsillo o utilice una aplicacin o sitio web en el telfono mvil. Algunos programas calcularn las  caloras por usted y Insurance account manager la cantidad de caloras que le quedan para llegar al objetivo diario. Cules son algunos consejos para controlar las porciones?  Sepa cuntas caloras hay en una porcin. Esto lo ayudar a saber cuntas porciones de un alimento determinado puede comer.  Use una taza medidora para medir los tamaos de las porciones. Tambin Hydrographic surveyor las porciones en una balanza de cocina. Con el tiempo, podr hacer un clculo estimativo de los tamaos de las porciones de algunos alimentos.  Dedique tiempo a poner porciones de diferentes alimentos en sus platos, tazones y tazas predilectos, a fin de saber cmo se ve una porcin.  Intente no comer directamente de un envase de alimentos, por ejemplo, de una bolsa o una caja. Comer directamente del envase dificulta ver cunto est comiendo y puede conducir a Actuary. Ponga la cantidad [employer] gustara comer en una taza o un plato, a fin de asegurarse de que est comiendo la porcin correcta.  Use platos, vasos y tazones ms pequeos para medir porciones ms pequeas y Automotive engineer no comer en exceso.  Intente no realizar varias tareas al [employer]. Por ejemplo, evite mirar televisin o usar la [employer]ant come. Si es la hora de comer, sintese a Museum/gallery conservator y disfrute de Chemical engineer. Esto lo ayudar a Public house manager cundo est satisfecho. Tambin le permitir estar ms consciente de qu come y cunto come. Consejos para seguir Surveyor, minerals Al leer las etiquetas de los alimentos  Controle el recuento de caloras en comparacin con el tamao de la porcin. El tamao de la porcin puede ser ms pequeo de lo que suele comer.  Verifique la fuente de las caloras. Intente elegir alimentos ricos en protenas, fibras y vitaminas, y bajos en grasas saturadas, grasas trans y [place]. Al ir de compras  Lea las etiquetas nutricionales cuando compre. Esto lo ayudar a tomar decisiones saludables sobre qu alimentos comprar.  Preste  atencin a las etiquetas nutricionales de alimentos bajos en grasas o sin grasas. Estos alimentos a veces tienen la misma cantidad de caloras o ms caloras que las versiones ricas en grasas. Con frecuencia, tambin tienen agregados de azcar, almidn o sal, para darles el sabor que fue eliminado con las grasas.  Haga una lista de compras con los alimentos que tienen un menor contenido de caloras y Leisure centre manager. Al cocinar  Intente cocinar sus alimentos preferidos de una manera ms saludable. Por ejemplo, pruebe hornear en vez de frer.  Utilice productos lcteos descremados. Planificacin de las comidas  Utilice ms frutas y verduras. La mitad de su plato debe ser de frutas y verduras.  Incluya protenas magras, como pollo, pavo y [place]. Estilo de [employer], trate de hacer una de las  siguientes cosas:  de ejercicio moderado, como caminar.  de ejercicio enrgico, como correr. Informacin general  Sepa cuntas caloras tienen los alimentos que come con ms frecuencia. Esto le ayudar a contar las caloras ms rpidamente.  Encuentre un mtodo para controlar las caloras que funcione para usted. Sea creativo. Pruebe aplicaciones o programas distintos, si llevar un registro de las caloras no funciona para usted. Qu alimentos debo consumir?  Consuma alimentos nutritivos. Es mejor comer un alimento nutritivo, de alto contenido calrico, como un aguacate, que uno con pocos nutrientes, como una bolsa de patatas fritas.  Use sus caloras en alimentos y bebidas que lo sacien y no lo dejen con apetito apenas termina de comer. ? Ejemplos de alimentos que lo sacian son los frutos secos y Civil engineer, contracting de frutos secos, verduras, Associate Professor y Forensic scientist con alto contenido de Research scientist (life sciences) como los cereales integrales. Los alimentos con alto contenido de [place] son aquellos que tienen ms de 5g de fibra por porcin.  Preste atencin a las [employer] Brands. Las  bebidas de bajas caloras incluyen agua y refrescos sin International aid/development worker. Es posible que los productos que se enumeran ms [place] no constituyan una lista completa de los alimentos y las bebidas que puede tomar. Consulte a un nutricionista para obtener ms informacin.   Qu alimentos debo limitar? Limite el consumo de alimentos o bebidas que no sean buenas fuentes de vitaminas, minerales o protenas, o que tengan alto contenido de grasas no saludables. Estos incluyen:  Caramelos.  Otros dulces.  Refrescos, bebidas con caf especiales, alcohol y [place]. Es posible que los productos que se enumeran ms [place] no constituyan una lista completa de los alimentos y las bebidas que Personnel officer. Consulte a un nutricionista para obtener ms informacin. Cmo puedo hacer el recuento de caloras cuando como afuera?  Preste atencin a las porciones. A menudo, las porciones son mucho ms grandes al comer afuera. Pruebe con estos consejos para mantener las porciones ms pequeas: ? Considere la posibilidad de compartir una comida en lugar de tomarla toda usted solo. ? Si pide su propia comida, coma solo la mitad. Antes de empezar a comer, pida un recipiente y ponga la mitad de la comida en l. ? Cuando sea posible, considere la posibilidad de pedir porciones ms pequeas del men en lugar de porciones completas.  Preste atencin a Soil scientist de alimentos y bebidas. Saber la forma en que se cocinan los alimentos y lo que incluye la comida puede ayudarlo a ingerir menos caloras. ? Si se detallan las caloras en el men, elija las opciones que contengan la menor cantidad. ? Elija platos que incluyan verduras, frutas, cereales integrales, productos lcteos con bajo contenido de grasa y Associate Professor. ? Opte por los alimentos hervidos, asados, cocidos a la parrilla o al vapor. Evite los alimentos a los que se les ponga mantequilla, que estn empanados o fritos, o que se sirvan con salsa a base de crema. Generalmente,  los alimentos que se etiquetan como "crujientes" estn fritos, a menos que se indique lo contrario. ? Elija el agua, la [place], [place] t helado sin azcar u otras bebidas que no contengan azcares agregados. Si desea una bebida alcohlica, escoja una opcin con menos caloras, como una copa de vino o una cerveza ligera. ? [employer], las salsas y los jarabes aparte. Estos son, con frecuencia, de alto contenido en caloras, por lo que debe limitar la cantidad que ingiere. ? Si desea una ensalada, elija Neomia Dear  de hortalizas y pida carnes a Patent attorneyla parrilla. Evite las guarniciones adicionales como el tocino, el queso o los alimentos fritos. Ordene el aderezo aparte o pida aceite de [place] Hopeoliva y vinagre o limn para Emergency planning/management officeraderezar.  Haga un clculo estimativo de la cantidad de porciones que le sirven. Conocer el tamao de las porciones lo ayudar a Theme park managerestar atento a la cantidad de comida que come Pitney Bowesen los restaurantes. Dnde buscar ms informacin  Centers for Disease Control and Prevention (Centros para el Control y la Prevencin de Enfermedades): FootballExhibition.com.brwww.cdc.gov  U.S. Department of Agriculture (Departamento de Agricultura de los EE.UU.): WrestlingReporter.dkmyplate.gov Resumen  El recuento de caloras es el registro de la cantidad de caloras que se comen y Audiological scientistbeben cada da. Si come menos caloras de las que el cuerpo necesita, debera bajar de [place] Endpeso.  Una cantidad de peso saludable para bajar por semana suele ser Eusebio Meentre 1 y Enedina Finner2libras (0.5 a 0.9kg). Esto significa, con frecuencia, reducir su ingesta diaria de caloras unas 500 a 750 caloras.  Es posible Veterinary surgeonencontrar la cantidad de caloras que contiene un alimento en la etiqueta de informacin nutricional. Si un alimento no tiene una etiqueta de informacin nutricional, intente buscar las caloras en Internet o pida ayuda al nutricionista.  Use platos, vasos y tazones ms pequeos para medir porciones ms pequeas y Automotive engineerevitar no comer en exceso.  Use sus caloras en alimentos y bebidas  que lo sacien y no lo dejen con apetito poco tiempo despus de haber comido. Esta informacin no tiene Theme park managercomo fin reemplazar el consejo del mdico. Asegrese de hacerle al mdico cualquier pregunta que tenga. Document Revised: 01/04/2020 Document Reviewed: 01/04/2020 Elsevier Patient Education  2021 Arvin[employer]itorElsevier Inc.

## 2021-02-24 NOTE — Progress Notes (Signed)
02/24/2021  Reason for Visit:  Umbilical hernia  Referring Provider:  Jonah Blue, MD  History of Present Illness: Veronica Castaneda is a 60 y.o. female presenting for evaluation of periumbilical hernia.  The patient was that she has had this hernia for about 2 years.  Due to financial issues, she has not been able to be evaluated for surgical repair.  However she just received financial approval which expires in October and the patient is ready now for a referral for her hernia.  Overall, the patient reports that she has discomfort in the area just above the umbilicus which gets more aggravated when she is doing heavy activity or lifting something heavy.  When this happens, she has noted that the area bulges more and also becomes somewhat harder.  However, she notices that this eases up when she is resting.  The discomfort is only in the periumbilical area that does not radiate to any of abdominal quadrants.  Denies any relationship of the discomfort to foods.  Denies any nausea, vomiting, constipation, diarrhea.  Denies any aggravation of this when she has a bowel movement.  She had a CT scan of the abdomen and pelvis with contrast on 12/01/2020 which showed a fat-containing umbilical hernia with a defect measuring about 2.4 x 2.6 cm.  I have personally viewed the images and agree with the findings.  Of note, the patient has issues with what she describes as asthma, but she was hospitalized in August 2021 with respiratory failure due to RSV infection which required mechanical ventilation.  She uses a nebulizer at home.  Past Medical History: Past Medical History:  Diagnosis Date  . Asthma   . CHF (congestive heart failure) (HCC)   . Dyspnea   . Hyperlipidemia   . Pneumonia   . Sleep apnea      Past Surgical History: Past Surgical History:  Procedure Laterality Date  . NO PAST SURGERIES    . No PAST SURGICAL HISTORY      Home Medications: Prior to Admission medications    Medication Sig Start Date End Date Taking? Authorizing Provider  albuterol (PROVENTIL) (2.5 MG/3ML) 0.083% nebulizer solution TAKE 3 MLS (2.5 MG TOTAL) BY NEBULIZATION EVERY 6 (SIX) HOURS AS NEEDED FOR WHEEZING OR SHORTNESS OF BREATH. 11/24/20 11/24/21 Yes Marcine Matar, MD  albuterol (VENTOLIN HFA) 108 (90 Base) MCG/ACT inhaler INHALE 2 PUFFS INTO THE LUNGS EVERY 4 (FOUR) HOURS AS NEEDED FOR WHEEZING OR SHORTNESS OF BREATH. 06/28/20 06/28/21 Yes Marcine Matar, MD  cyclobenzaprine (FLEXERIL) 5 MG tablet TAKE 1 TABLET BY MOUTH 3 TIMES DAILY AS NEEDED FOR MUSCLE SPASMS. DO NOT DRINK ALCOHOL OR DRIVE WHILE TAKING THIS MEDICATION. 10/17/20 10/17/21 Yes Particia Nearing, PA-C  Fluticasone-Umeclidin-Vilant 100-62.5-25 MCG/INH AEPB INHALE 1 PUFF INTO THE LUNGS DAILY. 11/24/20 11/24/21 Yes Marcine Matar, MD  furosemide (LASIX) 20 MG tablet TAKE 1 TABLET (20 MG TOTAL) BY MOUTH DAILY AS NEEDED FOR LEG SELLING. DO NOT TAKE MORE THAN 1 TABLET PER DAY 10/14/20 10/14/21 Yes Marcine Matar, MD  metFORMIN (GLUCOPHAGE) 500 MG tablet TAKE 1/2 TABLET (250 MG TOTAL) BY MOUTH DAILY WITH BREAKFAST. 10/14/20 10/14/21 Yes Marcine Matar, MD  Multiple Vitamin (MULTIVITAMIN WITH MINERALS) TABS tablet Take 1 tablet by mouth daily. 05/24/20  Yes Swayze, Ava, DO  polyethylene glycol (MIRALAX / GLYCOLAX) 17 g packet Take 17 g by mouth daily. 05/24/20  Yes Swayze, Ava, DO  atorvastatin (LIPITOR) 10 MG tablet TAKE 1 TABLET (10 MG TOTAL) BY MOUTH  DAILY. Patient not taking: Reported on 02/24/2021 11/25/20 11/25/21  Marcine Matar, MD    Allergies: No Known Allergies  Social History:  reports that she has never smoked. She has never used smokeless tobacco. She reports that she does not drink alcohol and does not use drugs.   Family History: Family History  Problem Relation Age of Onset  . Heart attack Mother   . Heart disease Mother     Review of Systems: Review of Systems  Constitutional: Negative for chills  and fever.  HENT: Negative for hearing loss.   Respiratory: Negative for shortness of breath.   Cardiovascular: Negative for chest pain.  Gastrointestinal: Positive for abdominal pain. Negative for constipation, diarrhea, nausea and vomiting.  Genitourinary: Negative for dysuria.  Musculoskeletal: Negative for myalgias.  Skin: Negative for rash.  Neurological: Negative for dizziness.  Psychiatric/Behavioral: Negative for depression.    Physical Exam BP (!) 144/85   Pulse 73   Temp 98.3 F (36.8 C) (Oral)   Ht 5' (1.524 m)   Wt 249 lb 6.4 oz (113.1 kg)   SpO2 95%   BMI 48.71 kg/m  CONSTITUTIONAL: No acute distress HEENT:  Normocephalic, atraumatic, extraocular motion intact. NECK: Trachea is midline, and there is no jugular venous distension.  RESPIRATORY:  Lungs are clear, and breath sounds are equal bilaterally. Normal respiratory effort without pathologic use of accessory muscles. CARDIOVASCULAR: Heart is regular without murmurs, gallops, or rubs. GI: The abdomen is soft, obese, non-distended, with some discomfort to palpation of the supraumbilical area.  The patient has a supraumbilical hernia with what appears to be fat inside hernia sac.  It is partially reducible, but I'm unable to palpate the full extent due to her body habitus.  No overlying skin changes or ulceration, and no peritonitis.  MUSCULOSKELETAL:  Normal muscle strength and tone in all four extremities.  No peripheral edema or cyanosis. SKIN: Skin turgor is normal. There are no pathologic skin lesions.  NEUROLOGIC:  Motor and sensation is grossly normal.  Cranial nerves are grossly intact. PSYCH:  Alert and oriented to person, place and time. Affect is normal.  Laboratory Analysis: Labs from 11/24/20: Sodium 137, potassium 4.5, chloride 97, CO2 26, BUN 11, creatinine 0.59.  Total bilirubin 0.3, AST 13, ALT 10 alkaline phosphatase 126.  Total cholesterol 276, triglycerides 298, LDL 177, HDL 41.  WBC 6.7, hemoglobin  14.8, hematocrit 44.8, platelets 221.  Hemoglobin A1c 5.7  Imaging: CT scan abdomen/pelvis 12/01/20: IMPRESSION: 1. Lobulated fat containing umbilical hernia with associated surrounding subcutaneus soft tissue edema. No mesenteric fat changes to suggest mesenteric ischemia. No associated bowel obstruction. Correlate with physical exam for incarceration. 2. Otherwise no acute intra-abdominal or intrapelvic abnormality.   Assessment and Plan: This is a 60 y.o. female with a partially reducible supraumbilical hernia.  - Currently the patient not experiencing any worrisome symptoms of  strangulation.  The hernia is partially reducible, although it is hard to fully palpate due to her body habitus.  Discussed with her what her umbilical hernia means and how they form and the natural progression to become larger with time.  Given her morbid obesity, surgical repair does have a higher risk of recurrence potential intraoperative complications.  Discussed with the patient that it would be beneficial if she could lose weight to decrease some of these risks.  However her financial assistance expires in October.  In light of this, we have discussed about following up in 2 months with me to give her some time  to hopefully lose some weight that could only help [place] for her surgery.  Discussed with her that she can start doing some exercise particularly walking and changed her diet to decrease the caloric intake particularly if she is doing any snacking between meals and I think this will help her to decrease some of the weight to help with her surgery in the future. - Patient is in agreement with this plan and she will follow-up with me in 2 months.  At that point, we will reassess her progress and schedule surgery to repair her umbilical hernia.  Face-to-face time spent with the patient and care providers was 60 minutes, with more than 50% of the time spent counseling, educating, and coordinating care of the  patient.     Howie Ill, MD [facility]

## 2021-02-28 ENCOUNTER — Other Ambulatory Visit: Payer: Self-pay

## 2021-03-28 ENCOUNTER — Other Ambulatory Visit: Payer: Self-pay

## 2021-03-28 ENCOUNTER — Ambulatory Visit: Payer: Self-pay | Attending: Internal Medicine | Admitting: Internal Medicine

## 2021-03-28 DIAGNOSIS — E782 Mixed hyperlipidemia: Secondary | ICD-10-CM

## 2021-03-28 DIAGNOSIS — E662 Morbid (severe) obesity with alveolar hypoventilation: Secondary | ICD-10-CM

## 2021-03-28 NOTE — Progress Notes (Signed)
Patient ID: Veronica Castaneda, female   DOB: [birth year], 60 y.o.   MRN: [id number] Virtual Visit via Telephone Note  I connected with Antony Salmon on 03/28/2021 at 3:18 p.m by telephone and verified that I am speaking with the correct person using two identifiers  Location: Patient: home Provider: office  Participants: Myself Patient [place] interpreter: [phone], Monica Martinez   I discussed the limitations, risks, security and privacy concerns of performing an evaluation and management service by telephone and the availability of in person appointments. I also discussed with the patient that there may be a patient responsible charge related to this service. The patient expressed understanding and agreed to proceed.   History of Present Illness: Pt with morbid obesity, OA LT knee, likely OSA/OHS, prediabetes, mixed HL, persistent asthma, hypoventilation syndrome with likely OSA. Last seen 11/2020.  Last seen 11/2020.  Umbilical hernia:  saw Dr. Hampton Abbot last mth.  He requested that she try to lose some wgh before he would consider doing surgery on her.  Pt states she is working on trying to lose  wgh.  States she is eating less. She tells me she also started walking daily for 20 mins. No scale at home to wgh self.  OHS/probably OSA:  Since last visit, we received confirmation from [facility] that one of their respiratory team member went out and instructed her on use of BiPAP and nebulizer machine. She tells me she has been using the BiPAP every night but needs supplies  HL:  cholesterol elev on last done 11/2020.  I recommended started Lipitor.  Pt has not picked up as yet.  States the pharmacy never gave it to her.    Outpatient Encounter Medications as of 03/28/2021  Medication Sig   albuterol (PROVENTIL) (2.5 MG/3ML) 0.083% nebulizer solution TAKE 3 MLS (2.5 MG TOTAL) BY NEBULIZATION EVERY 6 (SIX) HOURS AS NEEDED FOR WHEEZING OR SHORTNESS OF BREATH.   albuterol (VENTOLIN HFA) 108  (90 Base) MCG/ACT inhaler INHALE 2 PUFFS INTO THE LUNGS EVERY 4 (FOUR) HOURS AS NEEDED FOR WHEEZING OR SHORTNESS OF BREATH.   atorvastatin (LIPITOR) 10 MG tablet TAKE 1 TABLET (10 MG TOTAL) BY MOUTH DAILY. (Patient not taking: Reported on 02/24/2021)   cyclobenzaprine (FLEXERIL) 5 MG tablet TAKE 1 TABLET BY MOUTH 3 TIMES DAILY AS NEEDED FOR MUSCLE SPASMS. DO NOT DRINK ALCOHOL OR DRIVE WHILE TAKING THIS MEDICATION.   Fluticasone-Umeclidin-Vilant 100-62.5-25 MCG/INH AEPB INHALE 1 PUFF INTO THE LUNGS DAILY.   furosemide (LASIX) 20 MG tablet TAKE 1 TABLET (20 MG TOTAL) BY MOUTH DAILY AS NEEDED FOR LEG SELLING. DO NOT TAKE MORE THAN 1 TABLET PER DAY   metFORMIN (GLUCOPHAGE) 500 MG tablet TAKE 1/2 TABLET (250 MG TOTAL) BY MOUTH DAILY WITH BREAKFAST.   Multiple Vitamin (MULTIVITAMIN WITH MINERALS) TABS tablet Take 1 tablet by mouth daily.   polyethylene glycol (MIRALAX / GLYCOLAX) 17 g packet Take 17 g by mouth daily.   No facility-administered encounter medications on file as of 03/28/2021.      Observations/Objective: Results for orders placed or performed in visit on 11/24/20  Fecal occult blood, imunochemical([facility])   Specimen: Stool   ST  Result Value Ref Range   Fecal Occult Bld Negative Negative  CBC  Result Value Ref Range   WBC 6.7 3.4 - 10.8 x10E3/uL   RBC 4.66 3.77 - 5.28 x10E6/uL   Hemoglobin 14.8 11.1 - 15.9 g/dL   Hematocrit 44.8 34.0 - 46.6 %   MCV 96 79 - 97 fL  MCH 31.8 26.6 - 33.0 pg   MCHC 33.0 31.5 - 35.7 g/dL   RDW 12.1 11.7 - 15.4 %   Platelets 221 150 - 450 x10E3/uL  Comprehensive metabolic panel  Result Value Ref Range   Glucose 81 65 - 99 mg/dL   BUN 11 6 - 24 mg/dL   Creatinine, Ser 0.59 0.57 - 1.00 mg/dL   eGFR 105 >59 mL/min/1.73   BUN/Creatinine Ratio 19 9 - 23   Sodium 137 134 - 144 mmol/L   Potassium 4.5 3.5 - 5.2 mmol/L   Chloride 97 96 - 106 mmol/L   CO2 26 20 - 29 mmol/L   Calcium 9.0 8.7 - 10.2 mg/dL   Total Protein 7.2 6.0 - 8.5 g/dL    Albumin 4.1 3.8 - 4.9 g/dL   Globulin, Total 3.1 1.5 - 4.5 g/dL   Albumin/Globulin Ratio 1.3 1.2 - 2.2   Bilirubin Total 0.3 0.0 - 1.2 mg/dL   Alkaline Phosphatase 126 (H) 44 - 121 IU/L   AST 13 0 - 40 IU/L   ALT 10 0 - 32 IU/L  Hemoglobin A1c  Result Value Ref Range   Hgb A1c MFr Bld 5.7 (H) 4.8 - 5.6 %   Est. average glucose Bld gHb Est-mCnc 117 mg/dL  Lipid panel  Result Value Ref Range   Cholesterol, Total 276 (H) 100 - 199 mg/dL   Triglycerides 298 (H) 0 - 149 mg/dL   HDL 41 >39 mg/dL   VLDL Cholesterol Cal 58 (H) 5 - 40 mg/dL   LDL Chol Calc (NIH) 177 (H) 0 - 99 mg/dL   Chol/HDL Ratio 6.7 (H) 0.0 - 4.4 ratio     Assessment and Plan: 1. Mixed hyperlipidemia I will resend the prescription for the Lipitor to the pharmacy for her.  Patient states she will pick it up.  Discussed the importance of healthy eating habits.  2. Morbid obesity ([place]) Commended her on changes that she has made so far in her eating habits.  Dietary counseling given.  Advised to eliminate sugary drinks from her diet, cut back on portion sizes of white carbohydrates and eat more white meat instead of red meat.  Encouraged to incorporate fresh fruits and vegetables into her diet.  Continue her exercise routine that she has started.  Hopefully if she is able to lose some weight, she can get the hernia repair surgery done.  3. Obesity hypoventilation syndrome ([place]) Patient using her BiPAP.  She is in need of supplies but is uninsured.  Advised patient that she may have to pay out-of-pocket for her supplies.  Message sent to our caseworker.   Follow Up Instructions: 4 mths   I discussed the assessment and treatment plan with the patient. The patient was provided an opportunity to ask questions and all were answered. The patient agreed with the plan and demonstrated an understanding of the instructions.   The patient was advised to call back or seek an in-person evaluation if the symptoms worsen or if the  condition fails to improve as anticipated.  I  Spent 11 minutes on this telephone encounter  Karle Plumber, MD

## 2021-03-29 ENCOUNTER — Other Ambulatory Visit: Payer: Self-pay | Admitting: Internal Medicine

## 2021-03-29 DIAGNOSIS — E662 Morbid (severe) obesity with alveolar hypoventilation: Secondary | ICD-10-CM

## 2021-03-30 ENCOUNTER — Telehealth: Payer: Self-pay | Admitting: Internal Medicine

## 2021-03-30 ENCOUNTER — Other Ambulatory Visit: Payer: Self-pay

## 2021-03-30 MED ORDER — ATORVASTATIN CALCIUM 10 MG PO TABS
ORAL_TABLET | Freq: Every day | ORAL | 2 refills | Status: DC
  Filled 2021-03-30: qty 30, 30d supply, fill #0

## 2021-03-30 MED FILL — Fluticasone-Umeclidinium-Vilanterol AEPB 100-62.5-25 MCG/ACT: RESPIRATORY_TRACT | 90 days supply | Qty: 180 | Fill #0 | Status: AC

## 2021-03-30 NOTE — Telephone Encounter (Signed)
Rx has been sent please make pt aware

## 2021-03-30 NOTE — Telephone Encounter (Signed)
Called patient to schedule her follow up appointment and patient is requesting an update on her Cholesterol medication. Please f/u.

## 2021-03-31 ENCOUNTER — Other Ambulatory Visit: Payer: Self-pay

## 2021-04-12 ENCOUNTER — Telehealth: Payer: Self-pay

## 2021-04-12 NOTE — Telephone Encounter (Signed)
Orders for BiPAP mask and tubing sent to [facility].  Need to obtain pricing information as patient is uninsured

## 2021-05-12 ENCOUNTER — Telehealth: Payer: Self-pay

## 2021-05-12 ENCOUNTER — Encounter: Payer: Self-pay | Admitting: Surgery

## 2021-05-12 ENCOUNTER — Other Ambulatory Visit: Payer: Self-pay

## 2021-05-12 ENCOUNTER — Ambulatory Visit (INDEPENDENT_AMBULATORY_CARE_PROVIDER_SITE_OTHER): Payer: Self-pay | Admitting: Surgery

## 2021-05-12 VITALS — BP 116/74 | HR 80 | Temp 99.4°F | Ht 59.0 in | Wt 242.2 lb

## 2021-05-12 DIAGNOSIS — K429 Umbilical hernia without obstruction or gangrene: Secondary | ICD-10-CM

## 2021-05-12 NOTE — Progress Notes (Signed)
05/12/2021  History of Present Illness: Veronica Castaneda is a 60 y.o. female presenting for follow-up of umbilical hernia.  The patient was last seen about 2 months ago at which time I have encouraged the patient to try to lose weight as this would only help with the hernia repair.  She reports that over the last 2 months, she has lost maybe about 7 pounds.  She has tried to walk more by one of her limiting factors is back pain.  She reports that she feels the hernia is about the same size but she is also having some discomfort that radiates towards the epigastric region in the midline particularly when she is straining.  Sometimes when the hernia bulges more, she feels like the area is harder.  Past Medical History: Past Medical History:  Diagnosis Date   Asthma    CHF (congestive heart failure) (HCC)    Dyspnea    Hyperlipidemia    Pneumonia    Sleep apnea      Past Surgical History: Past Surgical History:  Procedure Laterality Date   NO PAST SURGERIES     No PAST SURGICAL HISTORY      Home Medications: Prior to Admission medications   Medication Sig Start Date End Date Taking? Authorizing Provider  albuterol (PROVENTIL) (2.5 MG/3ML) 0.083% nebulizer solution TAKE 3 MLS (2.5 MG TOTAL) BY NEBULIZATION EVERY 6 (SIX) HOURS AS NEEDED FOR WHEEZING OR SHORTNESS OF BREATH. 11/24/20 11/24/21 Yes Marcine Matar, MD  albuterol (VENTOLIN HFA) 108 (90 Base) MCG/ACT inhaler INHALE 2 PUFFS INTO THE LUNGS EVERY 4 (FOUR) HOURS AS NEEDED FOR WHEEZING OR SHORTNESS OF BREATH. 06/28/20 06/28/21 Yes Marcine Matar, MD  atorvastatin (LIPITOR) 10 MG tablet TAKE 1 TABLET (10 MG TOTAL) BY MOUTH DAILY. 03/30/21 03/30/22 Yes Marcine Matar, MD  cyclobenzaprine (FLEXERIL) 5 MG tablet TAKE 1 TABLET BY MOUTH 3 TIMES DAILY AS NEEDED FOR MUSCLE SPASMS. DO NOT DRINK ALCOHOL OR DRIVE WHILE TAKING THIS MEDICATION. 10/17/20 10/17/21 Yes Particia Nearing, PA-C  Fluticasone-Umeclidin-Vilant 100-62.5-25  MCG/INH AEPB INHALE 1 PUFF INTO THE LUNGS DAILY. 11/24/20 11/24/21 Yes Marcine Matar, MD  furosemide (LASIX) 20 MG tablet TAKE 1 TABLET (20 MG TOTAL) BY MOUTH DAILY AS NEEDED FOR LEG SELLING. DO NOT TAKE MORE THAN 1 TABLET PER DAY 10/14/20 10/14/21 Yes Marcine Matar, MD  metFORMIN (GLUCOPHAGE) 500 MG tablet TAKE 1/2 TABLET (250 MG TOTAL) BY MOUTH DAILY WITH BREAKFAST. 10/14/20 10/14/21 Yes Marcine Matar, MD  Multiple Vitamin (MULTIVITAMIN WITH MINERALS) TABS tablet Take 1 tablet by mouth daily. 05/24/20  Yes Swayze, Ava, DO  polyethylene glycol (MIRALAX / GLYCOLAX) 17 g packet Take 17 g by mouth daily. 05/24/20  Yes Swayze, Ava, DO    Allergies: No Known Allergies  Review of Systems: Review of Systems  Constitutional:  Negative for chills and fever.  HENT:  Negative for hearing loss.   Respiratory:  Negative for shortness of breath.   Cardiovascular:  Negative for chest pain.  Gastrointestinal:  Positive for abdominal pain (periumbilical and epigastric). Negative for nausea and vomiting.  Genitourinary:  Negative for dysuria.  Musculoskeletal:  Positive for back pain.  Skin:  Negative for rash.  Neurological:  Negative for dizziness.  Psychiatric/Behavioral:  Negative for depression.    Physical Exam BP 116/74   Pulse 80   Temp 99.4 F (37.4 C) (Oral)   Ht 4\' 11"  (1.499 m)   Wt 242 lb 3.2 oz (109.9 kg)   SpO2 93%  BMI 48.92 kg/m  CONSTITUTIONAL: No acute distress HEENT:  Normocephalic, atraumatic, extraocular motion intact. NECK: Trachea is midline without any jugular venous distention. RESPIRATORY:  Lungs are clear, and breath sounds are equal bilaterally. Normal respiratory effort without pathologic use of accessory muscles. CARDIOVASCULAR: Heart is regular without murmurs, gallops, or rubs. GI: The abdomen is soft, obese, nondistended, with mild discomfort to palpation at the umbilicus and just superior to it.  On today's exam, the patient appears to have actually 2  adjacent hernias, one which is in the superior portion of the umbilicus and another one that is just above that.  The patient appears to have lobulated fat within the hernias.  On today's exam, I am unable to reduce them. NEUROLOGIC:  Motor and sensation is grossly normal.  Cranial nerves are grossly intact. PSYCH:  Alert and oriented to person, place and time. Affect is normal.  Labs/Imaging: Labs from 11/24/2020: Sodium 137, potassium 4.5, chloride 97, CO2 26, BUN 11, creatinine 0.59.  Total bilirubin 0.3, AST 13, ALT 10, alkaline phosphatase 126.  Total cholesterol 276, triglycerides 298, LDL 177, HDL 41.  WBC 6.7, hemoglobin 14.8, hematocrit 44.8, platelets 221.  Hemoglobin A1c 5.7.  CT scan of abdomen/pelvis on 12/01/2020: IMPRESSION: 1. Lobulated fat containing umbilical hernia with associated surrounding subcutaneus soft tissue edema. No mesenteric fat changes to suggest mesenteric ischemia. No associated bowel obstruction. Correlate with physical exam for incarceration. 2. Otherwise no acute intra-abdominal or intrapelvic abnormality.    Assessment and Plan: This is a 60 y.o. female with an incarcerated umbilical and ventral hernia.  - Discussed with the patient that there are 2 options as far as how to proceed going forwards.  1 option would be to continue attempting weight loss in order to improve the outcomes for surgery and decrease the risk of potential complications.  The other option would be proceeding with surgery as is understanding that there are risks particular given her obesity of wound complications or anesthesia complications.  At this point the patient although she does have symptoms they do not appear to be worsening and although the hernia appears to be more incarcerated, there is no significant tenderness when pushing on that area.   -However the patient also reports that she has [facility] financial assistance for the surgery and this expires on October 3rd 2022.  As  such, she would like to proceed with surgery.  I think given that we do have some additional time, I would like to schedule surgery in a month to give her some more additional time to attempt weight loss.  I discussed with her the any weight loss as can only be helpful.  Next line-discussed with her the role for robotic approach for the surgery with planned surgery being robotic assisted ventral hernia repair.  Reviewed the surgery at length with her including the risks of bleeding, infection, and injury to surrounding structures.  Also discussed with her that this is a same-day surgery, postoperative activity restrictions, pain control, location of incisions, and she is willing to proceed. -We will schedule her for 06/06/2021.  We will also send medical clearance to her PCP.  Face-to-face time spent with the patient and care providers was 40 minutes, with more than 50% of the time spent counseling, educating, and coordinating care of the patient.     Howie Ill, MD [facility]

## 2021-05-12 NOTE — Patient Instructions (Addendum)
Our surgery scheduler will call you within 24-48 hours to schedule your surgery. Please have the Blue surgery sheet available when speaking with her.   Umbilical Hernia, Adult A hernia is a bulge of tissue that pushes through an opening between muscles. An umbilical hernia happens in the abdomen, near the belly button (umbilicus). The hernia may contain tissues from the small intestine, large intestine, or fatty tissue covering the intestines (omentum). Umbilical hernias in adults tend to get worse over time, and they require surgical treatment. There are several types of umbilical hernias. You may have: A hernia located just above or below the umbilicus (indirect hernia). This is the most common type of umbilical hernia in adults. A hernia that forms through an opening formed by the umbilicus (direct hernia). A hernia that comes and goes (reducible hernia). A reducible hernia may be visible only when you strain, lift something heavy, or cough. This type of hernia can be pushed back into the abdomen (reduced). A hernia that traps abdominal tissue inside the hernia (incarcerated hernia). This type of hernia cannot be reduced. A hernia that cuts off blood flow to the tissues inside the hernia (strangulated hernia). The tissues can start to die if this happens. This type of hernia requires emergency treatment. What are the causes? An umbilical hernia happens when tissue inside the abdomen presses on a weak area of the abdominal muscles. What increases the risk? You may have a greater risk of this condition if you: Are obese. Have had several pregnancies. Have a buildup of fluid inside your abdomen (ascites). Have had surgery that weakens the abdominal muscles. What are the signs or symptoms? The main symptom of this condition is a painless bulge at or near the belly button. A reducible hernia may be visible only when you strain, lift something heavy, or cough. Other symptoms may include: Dull  pain. A feeling of pressure. Symptoms of a strangulated hernia may include: Pain that gets increasingly worse. Nausea and vomiting. Pain when pressing on the hernia. Skin over the hernia becoming red or purple. Constipation. Blood in the stool. How is this diagnosed? This condition may be diagnosed based on: A physical exam. You may be asked to cough or strain while standing. These actions increase the pressure inside your abdomen and force the hernia through the opening in your muscles. Your health care provider may try to reduce the hernia by pressing on it. Your symptoms and medical history. How is this treated? Surgery is the only treatment for an umbilical hernia. Surgery for a strangulated hernia is done as soon as possible. If you have a small hernia that is not incarcerated, you may need to lose weight before having surgery. Follow these instructions at home: Lose weight, if told by your health care provider. Do not try to push the hernia back in. Watch your hernia for any changes in color or size. Tell your health care provider if any changes occur. You may need to avoid activities that increase pressure on your hernia. Do not lift anything that is heavier than 10 lb (4.5 kg) until your health care provider says that this is safe. Take over-the-counter and prescription medicines only as told by your health care provider. Keep all follow-up visits as told by your health care provider. This is important. Contact a health care provider if: Your hernia gets larger. Your hernia becomes painful. Get help right away if: You develop sudden, severe pain near the area of your hernia. You have pain as   well as nausea or vomiting. You have pain and the skin over your hernia changes color. You develop a fever. This information is not intended to replace advice given to you by your health care provider. Make sure you discuss any questions you have with your health care provider. Document  Revised: 10/23/2017 Document Reviewed: 03/11/2017 Elsevier Patient Education  2021 Elsevier Inc.  

## 2021-05-12 NOTE — Telephone Encounter (Signed)
Medical Clearance faxed to Dr.Deborah Laural Benes [phone] at this time.

## 2021-05-15 ENCOUNTER — Telehealth: Payer: Self-pay | Admitting: Surgery

## 2021-05-15 NOTE — Telephone Encounter (Signed)
Through [employer], spoke with [place], [place] [id number].  Patient is informed of the following: Patient has been advised of Pre-Admission date/time, COVID Testing date and Surgery date.  Surgery Date: 06/06/21 Preadmission Testing Date: 05/26/21 (phone 1p-5p) Covid Testing Date: Not needed.    Patient has been made aware to call [phone], between 1-3:00pm the day before surgery, to find out what time to arrive for surgery.

## 2021-05-16 NOTE — Telephone Encounter (Signed)
fyi

## 2021-05-16 NOTE — Telephone Encounter (Signed)
Good Morning [place] I have looked in Dr. Laural Benes papers and I have received anything in regards to pt could you please refax if possible

## 2021-05-16 NOTE — Telephone Encounter (Signed)
Shelia with [facility] called to see if the office has rec'd this fax and if so can Dr. Laural Benes please complete and fax back asap.  CB#  [phone]

## 2021-05-17 NOTE — Telephone Encounter (Signed)
Faxed receive and pt will be contacted to schedule a surgical clearance

## 2021-05-18 ENCOUNTER — Ambulatory Visit: Payer: Self-pay | Attending: Internal Medicine | Admitting: Internal Medicine

## 2021-05-18 ENCOUNTER — Other Ambulatory Visit: Payer: Self-pay

## 2021-05-18 ENCOUNTER — Telehealth: Payer: Self-pay

## 2021-05-18 VITALS — BP 114/74 | HR 79 | Resp 16 | Wt 242.6 lb

## 2021-05-18 DIAGNOSIS — Z01810 Encounter for preprocedural cardiovascular examination: Secondary | ICD-10-CM | POA: Insufficient documentation

## 2021-05-18 DIAGNOSIS — Z79899 Other long term (current) drug therapy: Secondary | ICD-10-CM | POA: Insufficient documentation

## 2021-05-18 DIAGNOSIS — K429 Umbilical hernia without obstruction or gangrene: Secondary | ICD-10-CM

## 2021-05-18 DIAGNOSIS — E782 Mixed hyperlipidemia: Secondary | ICD-10-CM

## 2021-05-18 DIAGNOSIS — Z7984 Long term (current) use of oral hypoglycemic drugs: Secondary | ICD-10-CM | POA: Insufficient documentation

## 2021-05-18 DIAGNOSIS — Z01818 Encounter for other preprocedural examination: Secondary | ICD-10-CM

## 2021-05-18 DIAGNOSIS — Z8249 Family history of ischemic heart disease and other diseases of the circulatory system: Secondary | ICD-10-CM | POA: Insufficient documentation

## 2021-05-18 DIAGNOSIS — Z7951 Long term (current) use of inhaled steroids: Secondary | ICD-10-CM | POA: Insufficient documentation

## 2021-05-18 DIAGNOSIS — E662 Morbid (severe) obesity with alveolar hypoventilation: Secondary | ICD-10-CM

## 2021-05-18 DIAGNOSIS — J45909 Unspecified asthma, uncomplicated: Secondary | ICD-10-CM | POA: Insufficient documentation

## 2021-05-18 DIAGNOSIS — R7303 Prediabetes: Secondary | ICD-10-CM

## 2021-05-18 DIAGNOSIS — Z6841 Body Mass Index (BMI) 40.0 and over, adult: Secondary | ICD-10-CM

## 2021-05-18 MED ORDER — ATORVASTATIN CALCIUM 10 MG PO TABS
ORAL_TABLET | Freq: Every day | ORAL | 6 refills | Status: DC
  Filled 2021-05-18: qty 30, 30d supply, fill #0
  Filled 2021-06-16: qty 30, 30d supply, fill #1

## 2021-05-18 MED ORDER — METFORMIN HCL 500 MG PO TABS
ORAL_TABLET | ORAL | 6 refills | Status: DC
Start: 2021-05-18 — End: 2022-03-21
  Filled 2021-05-18: qty 30, 30d supply, fill #0
  Filled 2021-06-16: qty 30, 30d supply, fill #1
  Filled 2021-12-04: qty 30, 30d supply, fill #0

## 2021-05-18 NOTE — Telephone Encounter (Signed)
Met with the patient when she was in the clinic because she has not received a new CPAP mask.  Spanish Interpreter # [id number] assisted.   Explained the [employer] CPAP mask program.  The patient stated that she has no income and is not able to afford the $25 cost for a new mask.  Explained to her that [facility] has funding to assist with this and will pay the $25 fee.  The patient was very appreciative.  Explained to her that the mask  will be shipped to [facility] and she will need to pick it up at the clinic. She was in agreement to this plan. She chose not to answer the optional questions on the application.

## 2021-05-18 NOTE — Progress Notes (Signed)
Patient ID: Veronica Castaneda, female    DOB: 06-25-1961  MRN: [id number]  CC: pre-op re-eval  Subjective: Veronica Castaneda is a 60 y.o. female who presents for surgical clearance Her concerns today include:  Pt with morbid obesity, OA LT knee, likely OSA/OHS, prediabetes, mixed HL, persistent asthma, hypoventilation syndrome with likely OSA on BiPAP.   Pt has seen Dr. Aleen Castaneda again for f/u umbilical hernia.  He had wanted her to lose some weight prior to agreeing to perform surgery for repair.  She has lost 7 pounds since we last saw her.  She reported pain when she bends over and when trying to lifting something heavy.  Patient wanted to proceed with having the surgery done. No previous surgery Has OHS with likely OSA on BiPAP. She tells me she will be needing more supplies.  She is uninsured.  Uses BiPAP 3 days a wk because it causes dryness of her mouth. She confirms putting water in the device before use.  In regards to her asthma, symptoms controlled.  Not having to use inhalers every day Denies chronic cough, SOB, CP, LE edema. Can walk 1 block before having to stop due to pain in feet and knees.  Has known OA knees.  She had an echo done in March 2020 that revealed normal LV function and diastolic parameters consistent with impaired relaxation. PreDM: out metformin  Patient Active Problem List   Diagnosis Date Noted   Hyperventilation syndrome 11/24/2020   Asthma exacerbation 05/22/2020   Morbid obesity with body mass index (BMI) of 50.0 to 59.9 in adult (HCC) 05/22/2020   Obesity hypoventilation syndrome (HCC) 05/22/2020   OSA (obstructive sleep apnea) 05/22/2020   RSV (respiratory syncytial virus infection) 05/22/2020   Difficult airway for intubation 05/22/2020   Acute respiratory failure with hypoxia (HCC) 05/12/2020   Loud snoring 02/11/2020   Daytime sleepiness 02/11/2020   Screening breast examination 10/22/2019   Prediabetes 08/18/2019    Hyperlipidemia 08/18/2019   Morbid obesity (HCC) 08/17/2019     Current Outpatient Medications on File Prior to Visit  Medication Sig Dispense Refill   albuterol (PROVENTIL) (2.5 MG/3ML) 0.083% nebulizer solution TAKE 3 MLS (2.5 MG TOTAL) BY NEBULIZATION EVERY 6 (SIX) HOURS AS NEEDED FOR WHEEZING OR SHORTNESS OF BREATH. 75 mL 6   albuterol (VENTOLIN HFA) 108 (90 Base) MCG/ACT inhaler INHALE 2 PUFFS INTO THE LUNGS EVERY 4 (FOUR) HOURS AS NEEDED FOR WHEEZING OR SHORTNESS OF BREATH. 18 g 6   atorvastatin (LIPITOR) 10 MG tablet TAKE 1 TABLET (10 MG TOTAL) BY MOUTH DAILY. 30 tablet 2   cyclobenzaprine (FLEXERIL) 5 MG tablet TAKE 1 TABLET BY MOUTH 3 TIMES DAILY AS NEEDED FOR MUSCLE SPASMS. DO NOT DRINK ALCOHOL OR DRIVE WHILE TAKING THIS MEDICATION. 15 tablet 0   Fluticasone-Umeclidin-Vilant 100-62.5-25 MCG/INH AEPB INHALE 1 PUFF INTO THE LUNGS DAILY. 60 each 11   furosemide (LASIX) 20 MG tablet TAKE 1 TABLET (20 MG TOTAL) BY MOUTH DAILY AS NEEDED FOR LEG SELLING. DO NOT TAKE MORE THAN 1 TABLET PER DAY 30 tablet 6   metFORMIN (GLUCOPHAGE) 500 MG tablet TAKE 1/2 TABLET (250 MG TOTAL) BY MOUTH DAILY WITH BREAKFAST. 30 tablet 6   Multiple Vitamin (MULTIVITAMIN WITH MINERALS) TABS tablet Take 1 tablet by mouth daily. 30 tablet 0   polyethylene glycol (MIRALAX / GLYCOLAX) 17 g packet Take 17 g by mouth daily. 14 each 0   No current facility-administered medications on file prior to visit.    No Known  Allergies  Social History   Socioeconomic History   Marital status: Single    Spouse name: Not on file   Number of children: 3   Years of education: Not on file   Highest education level: 2nd grade  Occupational History   Occupation: house wife  Tobacco Use   Smoking status: Never   Smokeless tobacco: Never  Vaping Use   Vaping Use: Never used  Substance and Sexual Activity   Alcohol use: Never   Drug use: Never   Sexual activity: Not Currently  Other Topics Concern   Not on file  Social  History Narrative   Not on file   Social Determinants of Health   Financial Resource Strain: Not on file  Food Insecurity: Not on file  Transportation Needs: Unmet Transportation Needs   Lack of Transportation (Medical): Yes   Lack of Transportation (Non-Medical): Yes  Physical Activity: Not on file  Stress: Not on file  Social Connections: Not on file  Intimate Partner Violence: Not on file    Family History  Problem Relation Age of Onset   Heart attack Mother    Heart disease Mother     Past Surgical History:  Procedure Laterality Date   NO PAST SURGERIES     No PAST SURGICAL HISTORY      ROS: Review of Systems Negative except as stated above  PHYSICAL EXAM: BP 114/74   Pulse 79   Resp 16   Wt 242 lb 9.6 oz (110 kg)   SpO2 96%   BMI 49.00 kg/m   Wt Readings from Last 3 Encounters:  05/18/21 242 lb 9.6 oz (110 kg)  05/12/21 242 lb 3.2 oz (109.9 kg)  02/24/21 249 lb 6.4 oz (113.1 kg)    Physical Exam  General appearance - alert, well appearing, morbidly obese middle-age to older Hispanic female and in no distress Mental status - normal mood, behavior, speech, dress, motor activity, and thought processes Eyes - pupils equal and reactive, extraocular eye movements intact Nose - normal and patent, no erythema, discharge or polyps Mouth - mucous membranes moist, pharynx normal without lesions Neck - supple, no significant adenopathy Chest - clear to auscultation, no wheezes, rales or rhonchi, symmetric air entry Heart - normal rate, regular rhythm, normal S1, S2, no murmurs, rubs, clicks or gallops Abdomen -morbidly obese.  Normal bowel sounds. Extremities -no lower extremity edema   CMP Latest Ref Rng & Units 11/24/2020 05/27/2020 05/21/2020  Glucose 65 - 99 mg/dL 81 850(Y) 774(J)  BUN 6 - 24 mg/dL 11 9 28(N)  Creatinine 0.57 - 1.00 mg/dL 8.67 6.72(C) 9.47  Sodium 134 - 144 mmol/L 137 140 145  Potassium 3.5 - 5.2 mmol/L 4.5 4.2 3.5  Chloride 96 - 106 mmol/L  97 102 110  CO2 20 - 29 mmol/L 26 24 25   Calcium 8.7 - 10.2 mg/dL 9.0 9.1 9.1  Total Protein 6.0 - 8.5 g/dL 7.2 - -  Total Bilirubin 0.0 - 1.2 mg/dL 0.3 - -  Alkaline Phos 44 - 121 IU/L 126(H) - -  AST 0 - 40 IU/L 13 - -  ALT 0 - 32 IU/L 10 - -   Lipid Panel     Component Value Date/Time   CHOL 276 (H) 11/24/2020 1102   TRIG 298 (H) 11/24/2020 1102   HDL 41 11/24/2020 1102   CHOLHDL 6.7 (H) 11/24/2020 1102   LDLCALC 177 (H) 11/24/2020 1102    CBC    Component Value Date/Time   WBC  6.7 11/24/2020 1102   WBC 7.4 05/17/2020 0429   RBC 4.66 11/24/2020 1102   RBC 4.59 05/17/2020 0429   HGB 14.8 11/24/2020 1102   HCT 44.8 11/24/2020 1102   PLT 221 11/24/2020 1102   MCV 96 11/24/2020 1102   MCH 31.8 11/24/2020 1102   MCH 27.9 05/17/2020 0429   MCHC 33.0 11/24/2020 1102   MCHC 29.6 (L) 05/17/2020 0429   RDW 12.1 11/24/2020 1102   LYMPHSABS 1.0 05/12/2020 0654   MONOABS 1.0 05/12/2020 0654   EOSABS 0.1 05/12/2020 0654   BASOSABS 0.0 05/12/2020 0654   EKG: Normal sinus rhythm.  Normal EKG.  ASSESSMENT AND PLAN: 1. Preoperative evaluation to rule out surgical contraindication -I think for her the major risk would be healing of the surgical wound given her size.  I think more weight loss would have helped but patient would like to proceed with surgery.  I think she is clinically stable for surgery.  Patient advised to use her BiPAP consistently every night.  Anesthesia should keep her head slightly elevated and frequent checks of O2 level perioperatively Her physical activity is significantly limited due to arthritis so I cannot judge her mets.  We will try to get her in with a cardiologist to see whether any further preoperative cardiac testing needed. - Ambulatory referral to Cardiology  2. Umbilical hernia without obstruction and without gangrene Plan for repair  3. Morbid obesity (HCC) Commended on weight loss so far.  Discussed and encourage healthy eating habits to  achieve further weight loss.  4. Obesity hypoventilation syndrome (HCC) Encouraged her to use the BiPAP every night especially with surgery being planned in the near future.  She may have to intermittently get up through the night and drink some water to decrease the dryness in the throat. Also advised to make sure that her BiPAP machine does not run out of water through the night.  5. Prediabetes See #3 above - metFORMIN (GLUCOPHAGE) 500 MG tablet; TAKE 1/2 TABLET (250 MG TOTAL) BY MOUTH DAILY WITH BREAKFAST.  Dispense: 30 tablet; Refill: 6  6. Mixed hyperlipidemia Continue atorvastatin.  Refill given. - atorvastatin (LIPITOR) 10 MG tablet; TAKE 1 TABLET (10 MG TOTAL) BY MOUTH DAILY.  Dispense: 30 tablet; Refill: 6   Patient was given the opportunity to ask questions.  Patient verbalized understanding of the plan and was able to repeat key elements of the plan.  [employer] Language interpreter used during this encounter. #[id number], Donald Pore   No orders of the defined types were placed in this encounter.    Requested Prescriptions    No prescriptions requested or ordered in this encounter    No follow-ups on file.  Jonah Blue, MD, FACP

## 2021-05-19 NOTE — Telephone Encounter (Signed)
I have sent a staff message to Dr. Aleen Campi to make him aware of pt surgical clearance.   Per Dr. Laural Benes she recommends further assessment/work-up prior to surgery. She has referred pt to cardiology to assess needs for any further cardia testing prior to surgery

## 2021-05-26 ENCOUNTER — Other Ambulatory Visit: Payer: Self-pay

## 2021-05-26 ENCOUNTER — Other Ambulatory Visit
Admission: RE | Admit: 2021-05-26 | Discharge: 2021-05-26 | Disposition: A | Discharge status: Home / Self Care | Payer: Self-pay | Source: Ambulatory Visit | Attending: Surgery | Admitting: Surgery

## 2021-05-26 HISTORY — DX: Prediabetes: R73.03

## 2021-05-26 NOTE — Patient Instructions (Addendum)
Your procedure is scheduled on: Tuesday June 06, 2021. Su procedimiento est programado para: Martes 13 de Septiembre del 2022. Report to Day [facility] inside [facility] 2nd floor stop by admissions desk first before getting on elevator.  Presntese a: A Diplomatic Services operational officer del [facility] 2do piso registrese primero antes de subir al [employer]. To find out your arrival time please call [phone] between 1PM - 3PM on Monday June 05, 2021. Para saber su hora de llegada por favor llame al [phone] entre la 1PM - 3PM el da: Lunes 12 de [place] del 2022.  Remember: Instructions that are not followed completely may result in serious medical risk, up to and including death,  or upon the discretion of your surgeon and anesthesiologist your surgery may need to be rescheduled.  Recuerde: Las instrucciones que no se siguen completamente Armed forces logistics/support/administrative officer en un riesgo de salud grave, incluyendo hasta  la [place] o a discrecin de su cirujano y Scientific laboratory technician, su ciruga se puede posponer.   __X_ 1.Do not eat food after midnight the night before your procedure. No    gum chewing or hard candies. You may drink clear liquids up to 2 hours     before you are scheduled to arrive for your surgery- DO not drink clear     Liquids within 2 hours of the start of your surgery.     Clear Liquids include:    water, apple juice without pulp, Black Coffee or Tea (Do not add anything to coffee or tea).      No coma nada despus de la medianoche de la noche anterior a su    procedimiento. No coma chicles ni caramelos duros. Puede tomar    lquidos claros hasta 2 horas antes de su hora programada de llegada al     hospital para su procedimiento.   Los lquidos claros incluyen:          - Agua o jugo de [place] sin pulpa          - Caf negro o t claro (sin leche, sin cremas, no agregue nada al caf ni al t)              _X__ 2.Do Not Smoke or use e-cigarettes For 24 Hours Prior to Your  Surgery.    Do not use any chewable tobacco products for at least 6   hours prior to surgery.    No fume ni use cigarrillos electrnicos durante las 24 horas previas    a su [place].  No use ningn producto de tabaco masticable durante   al menos 6 horas antes de la [place].     __X_ 3. No alcohol for 24 hours before or after surgery.    No tome alcohol durante las 24 horas antes ni despus de la [place].   __X__4. On the morning of surgery brush your teeth with toothpaste and water, you                may rinse your mouth with mouthwash if you wish.  Do not swallow any toothpaste of mouthwash.   En la maana de la [place], cepllese los dientes con pasta de dientes y [employer],                [place] enjuagarse la boca con enjuague bucal si lo desea. No ingiera ninguna pasta de dientes o enjuague bucal.   __X__ 5. Notify your doctor if there is any change in your medical condition (cold,fever, infections).  Informe a su mdico si hay algn cambio en su condicin mdica  (resfriado, fiebre, infecciones).   Do not wear jewelry, make-up, hairpins, clips or nail polish.  No use joyas, maquillajes, pinzas/ganchos para el cabello ni esmalte de uas.  Do not wear lotions, powders, or perfumes. You may wear deodorant.  No use lociones, polvos o perfumes.  Puede usar desodorante.    Do not shave 48 hours prior to surgery. Men may shave face and neck.  No se afeite 48 horas antes de la [place].  Los hombres pueden [employer] cara  y el cuello.   Do not bring valuables to the hospital.   No lleve objetos de valor al hospital.  [facility] is not responsible for any belongings or valuables.  [place] no se hace responsable de ningn tipo de pertenencias u objetos de Licensed conveyancer.               Contacts, dentures or bridgework may not be worn into surgery.  Los lentes de [place], las dentaduras postizas o puentes no se pueden usar en la [place].   Leave your suitcase in the car. After surgery it may  be brought to your room.  Deje su maleta en el auto.  Despus de la ciruga podr traerla a su habitacin.   For patients admitted to the hospital, discharge time is determined by your  treatment team.  Para los pacientes que sean ingresados al hospital, el tiempo en el cual se le  dar de alta es determinado por su equipo de [place].   Patients discharged the day of surgery will not be allowed to drive home. A los pacientes que se les da de alta el mismo da de la ciruga no se les permitir conducir a Higher education careers adviser.    __X__ Take these medicines the morning of surgery with A SIP OF WATER:          [employer] estas medicinas la maana de la ciruga con UN SORBO DE AGUA:  1. None   2.   3.   4.       5.  6.  ____ Fleet Enema (as directed)          Enema de [place] (segn lo indicado)    __X__ Use CHG Soap as directed          Utilice el jabn de CHG segn lo indicado  __X__ Use inhalers on the day of surgery          Use los inhaladores el da de la ciruga  albuterol (VENTOLIN HFA) 108 (90 Base) MCG/ACT inhaler  Fluticasone-Umeclidin-Vilant 100-62.5-25 MCG/INH AEPB  __X__ Stop metformin 2 days prior to surgery (take last dose 06/03/21)          Deje de tomar el metformin 2 das antes de la ciruga (su ultima dosis seria Sabado 9 de Septiembre)    ____ Take 1/2 of usual insulin dose the night before surgery and none on the morning of surgery           Tome la mitad de la dosis habitual de insulina la noche antes de la [place] y no tome nada en la maana de la             ciruga  ____ Stop Coumadin/Plavix/aspirin on           Deje de tomar el Coumadin/Plavix/aspirina el da:  __X__ One Week prior to surgery- Stop Anti-inflammatories such as Ibuprofen, Aleve, Advil, Motrin, meloxicam (MOBIC), diclofenac, etodolac,  ketorolac, Toradol, Daypro, piroxicam, Goody's or BC powders. OK TO USE TYLENOL IF NEEDED          Una semana antes de la ciruga: Suspender los antiinflamatorios como  ibuprofeno, Aleve, Advil, Motrin, meloxicam (MOBIC), diclofenaco, etodolaco, ketorolaco,   Toradol, Daypro, piroxicam, Goody's o polvos BC. ESTA BIEN USAR TYLENOL SI ES NECESARIO   __X__ Stop supplements until after surgery Multiple Vitamin (MULTIVITAMIN)           Deje de tomar suplementos hasta despus de la ciruga (Multivitamina)   __X__ Bring C-Pap to the hospital          Lleve el C-Pap al hospital

## 2021-06-01 ENCOUNTER — Telehealth: Payer: Self-pay

## 2021-06-01 ENCOUNTER — Other Ambulatory Visit: Payer: Self-pay

## 2021-06-01 DIAGNOSIS — I509 Heart failure, unspecified: Secondary | ICD-10-CM

## 2021-06-01 NOTE — Telephone Encounter (Signed)
Lynne Logan- Spanish interpreter used-Patient scheduled to see Dr.Egbor 06/05/21 @ 11:20 am. Psychologist, sport and exercise through [employer]. Spoke with Maribel patients daughter. Also let her know that surgery may be rescheduled.

## 2021-06-02 ENCOUNTER — Telehealth: Payer: Self-pay | Admitting: Surgery

## 2021-06-02 NOTE — Telephone Encounter (Signed)
Through [employer], spoke with [place], [id number].  Patient has now been informed of her updated surgery with Dr. Aleen Campi and is as follows:    Patient has been advised of Pre-Admission date/time, COVID Testing date and Surgery date.  Surgery Date: 06/15/21 Preadmission Testing Date: 06/08/21 (phone 8a-1p) Covid Testing Date: Not needed.     Patient has been made aware to call [phone], between 1-3:00pm the day before surgery, to find out what time to arrive for surgery.   Patient was also reminded to keep her cardiology appointment for 06/05/21 as we are pending clearance for her surgery.  Patient verbalized understanding.

## 2021-06-05 ENCOUNTER — Ambulatory Visit: Payer: Self-pay | Admitting: Cardiology

## 2021-06-08 ENCOUNTER — Other Ambulatory Visit: Payer: Self-pay

## 2021-06-08 ENCOUNTER — Ambulatory Visit (INDEPENDENT_AMBULATORY_CARE_PROVIDER_SITE_OTHER): Payer: Self-pay | Admitting: Cardiology

## 2021-06-08 ENCOUNTER — Inpatient Hospital Stay: Admission: RE | Admit: 2021-06-08 | Payer: Self-pay | Source: Ambulatory Visit

## 2021-06-08 ENCOUNTER — Encounter: Payer: Self-pay | Admitting: Cardiology

## 2021-06-08 VITALS — BP 114/68 | HR 69 | Ht 60.63 in | Wt 241.0 lb

## 2021-06-08 DIAGNOSIS — Z01818 Encounter for other preprocedural examination: Secondary | ICD-10-CM

## 2021-06-08 DIAGNOSIS — E7800 Pure hypercholesterolemia, unspecified: Secondary | ICD-10-CM

## 2021-06-08 DIAGNOSIS — E782 Mixed hyperlipidemia: Secondary | ICD-10-CM

## 2021-06-08 MED ORDER — ATORVASTATIN CALCIUM 40 MG PO TABS
40.0000 mg | ORAL_TABLET | Freq: Every day | ORAL | 3 refills | Status: DC
  Filled 2021-06-08: qty 30, 30d supply, fill #0

## 2021-06-08 NOTE — Progress Notes (Signed)
Cardiology Office Note:    Date:  06/08/2021   ID:  Veronica, Castaneda 11/27/1960, MRN [id number]  PCP:  Veronica Matar, MD   [facility] Providers Cardiologist:  Thurmon Fair, MD     Referring MD: Veronica Matar, MD   Chief Complaint  Patient presents with   New Patient (Initial Visit)    Referred for pre op evaluation. Patient is having surgery Sept 22nd. Patient c.o her thighs "being asleep" Meds reviewed verbally with patient.     History of Present Illness:    Veronica Castaneda is a 60 y.o. female with a hx of diabetes, hyperlipidemia, obesity, OSA who presents for preop evaluation.  Patient has abdominal hernia, surgical management is being planned.  She also has knee arthritis which is limiting her walking.  Denies any history of heart attacks or cardiac disease.  She uses her CPAP mask about 4 times a week due to it causing dryness in her airway.  She denies chest pain, does not have more shortness of breath with exertion unless whenever she has a cold.    Echocardiogram 11/2018 EF 55 to 60%.  Past Medical History:  Diagnosis Date   Asthma    CHF (congestive heart failure) (HCC)    Dyspnea    Hyperlipidemia    Pneumonia    Pre-diabetes    Sleep apnea     Past Surgical History:  Procedure Laterality Date   NO PAST SURGERIES     No PAST SURGICAL HISTORY      Current Medications: Current Meds  Medication Sig   albuterol (PROVENTIL) (2.5 MG/3ML) 0.083% nebulizer solution TAKE 3 MLS (2.5 MG TOTAL) BY NEBULIZATION EVERY 6 (SIX) HOURS AS NEEDED FOR WHEEZING OR SHORTNESS OF BREATH.   albuterol (VENTOLIN HFA) 108 (90 Base) MCG/ACT inhaler INHALE 2 PUFFS INTO THE LUNGS EVERY 4 (FOUR) HOURS AS NEEDED FOR WHEEZING OR SHORTNESS OF BREATH.   atorvastatin (LIPITOR) 40 MG tablet Take 1 tablet (40 mg total) by mouth daily.   furosemide (LASIX) 20 MG tablet TAKE 1 TABLET (20 MG TOTAL) BY MOUTH DAILY AS NEEDED FOR LEG SELLING. DO NOT TAKE MORE  THAN 1 TABLET PER DAY   metFORMIN (GLUCOPHAGE) 500 MG tablet TAKE 1/2 TABLET (250 MG TOTAL) BY MOUTH DAILY WITH BREAKFAST.   Multiple Vitamin (MULTIVITAMIN WITH MINERALS) TABS tablet Take 1 tablet by mouth daily.   polyethylene glycol (MIRALAX / GLYCOLAX) 17 g packet Take 17 g by mouth daily. (Patient taking differently: Take 17 g by mouth daily as needed for moderate constipation.)   [DISCONTINUED] atorvastatin (LIPITOR) 10 MG tablet TAKE 1 TABLET (10 MG TOTAL) BY MOUTH DAILY.     Allergies:   Patient has no known allergies.   Social History   Socioeconomic History   Marital status: Single    Spouse name: Not on file   Number of children: 3   Years of education: Not on file   Highest education level: 2nd grade  Occupational History   Occupation: house wife  Tobacco Use   Smoking status: Never   Smokeless tobacco: Never  Vaping Use   Vaping Use: Never used  Substance and Sexual Activity   Alcohol use: Never   Drug use: Never   Sexual activity: Not Currently  Other Topics Concern   Not on file  Social History Narrative   Not on file   Social Determinants of Health   Financial Resource Strain: Not on file  Food Insecurity: Not on file  Transportation Needs: Personal assistant (Medical): Yes   Lack of Transportation (Non-Medical): Yes  Physical Activity: Not on file  Stress: Not on file  Social Connections: Not on file     Family History: The patient's family history includes Heart attack in her mother; Heart disease in her mother.  ROS:   Please see the history of present illness.     All other systems reviewed and are negative.  EKGs/Labs/Other Studies Reviewed:    The following studies were reviewed today:   EKG:  EKG is  ordered today.  The ekg ordered today demonstrates normal sinus rhythm, normal ECG.  Recent Labs: 11/24/2020: ALT 10; BUN 11; Creatinine, Ser 0.59; Hemoglobin 14.8; Platelets 221; Potassium 4.5; Sodium 137   Recent Lipid Panel    Component Value Date/Time   CHOL 276 (H) 11/24/2020 1102   TRIG 298 (H) 11/24/2020 1102   HDL 41 11/24/2020 1102   CHOLHDL 6.7 (H) 11/24/2020 1102   LDLCALC 177 (H) 11/24/2020 1102     Risk Assessment/Calculations:          Physical Exam:    VS:  BP 114/68 (BP Location: Left Arm, Patient Position: Sitting, Cuff Size: Normal)   Pulse 69   Ht 5' 0.63" (1.54 m)   Wt 241 lb (109.3 kg)   SpO2 90%   BMI 46.09 kg/m     Wt Readings from Last 3 Encounters:  06/08/21 241 lb (109.3 kg)  05/26/21 242 lb (109.8 kg)  05/18/21 242 lb 9.6 oz (110 kg)     GEN:  Well nourished, well developed in no acute distress, obese HEENT: Normal NECK: No JVD; No carotid bruits LYMPHATICS: No lymphadenopathy CARDIAC: RRR, no murmurs, rubs, gallops RESPIRATORY:  Clear to auscultation without rales, wheezing or rhonchi  ABDOMEN: Soft, non-tender, distended MUSCULOSKELETAL:  No edema; No deformity  SKIN: Warm and dry NEUROLOGIC:  Alert and oriented x 3 PSYCHIATRIC:  Normal affect   ASSESSMENT:    1. Pre-op evaluation   2. Pure hypercholesterolemia   3. Morbid obesity (HCC)   4. Mixed hyperlipidemia    PLAN:    In order of problems listed above:  Preop evaluation, ventral hernia repair being planned.  Previous echocardiogram was normal.  Denies chest pain or shortness of breath.  Patient appears to be optimized from a cardiac perspective.  No additional cardiac testing or intervention will mitigate her risk.  Her risk mainly comes from being morbidly obese, OSA, and diabetic.  Respiratory function and wound healing will likely be impaired.  She can proceed with procedure from a cardiac perspective. Hyperlipidemia, cholesterol not controlled.  Increase Lipitor to 40 mg daily. Morbid obesity, weight loss advised, over 5 minutes spent counseling patient.  Follow-up as needed with [place].  She lives in [place], if additional cardiac concerns, plans to follow-up with our  colleagues in [place] office.     Medication Adjustments/Labs and Tests Ordered: Current medicines are reviewed at length with the patient today.  Concerns regarding medicines are outlined above.  Orders Placed This Encounter  Procedures   EKG 12-Lead   Meds ordered this encounter  Medications   atorvastatin (LIPITOR) 40 MG tablet    Sig: Take 1 tablet (40 mg total) by mouth daily.    Dispense:  90 tablet    Refill:  3     Patient Instructions  Medication Instructions:   Your physician has recommended you make the following change in your medication:   INCREASE your  Lipitor (Atorvastatin to 40 MG once a day.   *If you need a refill on your cardiac medications before your next appointment, please call your pharmacy*   Lab Work: None ordered If you have labs (blood work) drawn today and your tests are completely normal, you will receive your results only by: MyChart Message (if you have MyChart) OR A paper copy in the mail If you have any lab test that is abnormal or we need to change your treatment, we will call you to review the results.   Testing/Procedures: None ordered   Follow-Up: At [facility], you and your health needs are our priority.  As part of our continuing mission to provide you with exceptional heart care, we have created designated Provider Care Teams.  These Care Teams include your primary Cardiologist (physician) and Advanced Practice Providers (APPs -  Physician Assistants and Nurse Practitioners) who all work together to provide you with the care you need, when you need it.  We recommend signing up for the patient portal called "MyChart".  Sign up information is provided on this After Visit Summary.  MyChart is used to connect with patients for Virtual Visits (Telemedicine).  Patients are able to view lab/test results, encounter notes, upcoming appointments, etc.  Non-urgent messages can be sent to your provider as well.   To learn more about  what you can do with MyChart, go to [email or web address].    Your next appointment:   Follow up as needed   The format for your next appointment:   In Person  Provider:    [facility] at [facility] [address] [place], [place] [address] [phone]  Other Instructions     Signed, Debbe Odea, MD  06/08/2021 11:20 AM    [facility]

## 2021-06-08 NOTE — Patient Instructions (Addendum)
Medication Instructions:   Your physician has recommended you make the following change in your medication:   INCREASE your Lipitor (Atorvastatin to 40 MG once a day.   *If you need a refill on your cardiac medications before your next appointment, please call your pharmacy*   Lab Work: None ordered If you have labs (blood work) drawn today and your tests are completely normal, you will receive your results only by: MyChart Message (if you have MyChart) OR A paper copy in the mail If you have any lab test that is abnormal or we need to change your treatment, we will call you to review the results.   Testing/Procedures: None ordered   Follow-Up: At [facility], you and your health needs are our priority.  As part of our continuing mission to provide you with exceptional heart care, we have created designated Provider Care Teams.  These Care Teams include your primary Cardiologist (physician) and Advanced Practice Providers (APPs -  Physician Assistants and Nurse Practitioners) who all work together to provide you with the care you need, when you need it.  We recommend signing up for the patient portal called "MyChart".  Sign up information is provided on this After Visit Summary.  MyChart is used to connect with patients for Virtual Visits (Telemedicine).  Patients are able to view lab/test results, encounter notes, upcoming appointments, etc.  Non-urgent messages can be sent to your provider as well.   To learn more about what you can do with MyChart, go to [email or web address].    Your next appointment:   Follow up as needed   The format for your next appointment:   In Person  Provider:    [facility] at [facility] [address] [place], [place] [address] [phone]  Other Instructions

## 2021-06-12 ENCOUNTER — Telehealth: Payer: Self-pay

## 2021-06-12 NOTE — Telephone Encounter (Signed)
Called pt unable to reach, left VM to call back this nurse or Sport and exercise psychologist.

## 2021-06-15 ENCOUNTER — Other Ambulatory Visit: Payer: Self-pay

## 2021-06-15 ENCOUNTER — Encounter: Payer: Self-pay | Admitting: Surgery

## 2021-06-15 ENCOUNTER — Ambulatory Visit: Payer: Self-pay | Admitting: Anesthesiology

## 2021-06-15 ENCOUNTER — Encounter
Admission: RE | Disposition: A | Discharge status: Home / Self Care | Payer: Self-pay | Source: Home / Self Care | Attending: Surgery

## 2021-06-15 ENCOUNTER — Ambulatory Visit
Admission: RE | Admit: 2021-06-15 | Discharge: 2021-06-15 | Disposition: A | Discharge status: Home / Self Care | Payer: Self-pay | Attending: Surgery | Admitting: Surgery

## 2021-06-15 DIAGNOSIS — Z79899 Other long term (current) drug therapy: Secondary | ICD-10-CM | POA: Insufficient documentation

## 2021-06-15 DIAGNOSIS — Z7984 Long term (current) use of oral hypoglycemic drugs: Secondary | ICD-10-CM | POA: Insufficient documentation

## 2021-06-15 DIAGNOSIS — K420 Umbilical hernia with obstruction, without gangrene: Secondary | ICD-10-CM | POA: Insufficient documentation

## 2021-06-15 DIAGNOSIS — K429 Umbilical hernia without obstruction or gangrene: Secondary | ICD-10-CM

## 2021-06-15 DIAGNOSIS — K436 Other and unspecified ventral hernia with obstruction, without gangrene: Secondary | ICD-10-CM | POA: Insufficient documentation

## 2021-06-15 DIAGNOSIS — K439 Ventral hernia without obstruction or gangrene: Secondary | ICD-10-CM

## 2021-06-15 HISTORY — PX: XI ROBOTIC ASSISTED VENTRAL HERNIA: SHX6789

## 2021-06-15 HISTORY — PX: INSERTION OF MESH: SHX5868

## 2021-06-15 LAB — GLUCOSE, CAPILLARY
Glucose-Capillary: 113 mg/dL — ABNORMAL HIGH (ref 70–99)
Glucose-Capillary: 161 mg/dL — ABNORMAL HIGH (ref 70–99)

## 2021-06-15 SURGERY — REPAIR, HERNIA, VENTRAL, ROBOT-ASSISTED
Anesthesia: General

## 2021-06-15 MED ORDER — ONDANSETRON HCL 4 MG/2ML IJ SOLN
INTRAMUSCULAR | Status: DC | PRN
  Administered 2021-06-15: 4 mg via INTRAVENOUS

## 2021-06-15 MED ORDER — CHLORHEXIDINE GLUCONATE 0.12 % MT SOLN
OROMUCOSAL | Status: AC
  Administered 2021-06-15: 15 mL via OROMUCOSAL
  Filled 2021-06-15: qty 15

## 2021-06-15 MED ORDER — OXYCODONE HCL 5 MG PO TABS
5.0000 mg | ORAL_TABLET | Freq: Once | ORAL | Status: AC | PRN
  Administered 2021-06-15: 5 mg via ORAL

## 2021-06-15 MED ORDER — PROPOFOL 10 MG/ML IV BOLUS
INTRAVENOUS | Status: DC | PRN
  Administered 2021-06-15: 100 mg via INTRAVENOUS

## 2021-06-15 MED ORDER — ACETAMINOPHEN 500 MG PO TABS
ORAL_TABLET | ORAL | Status: AC
  Administered 2021-06-15: 1000 mg via ORAL
  Filled 2021-06-15: qty 2

## 2021-06-15 MED ORDER — FENTANYL CITRATE (PF) 100 MCG/2ML IJ SOLN: 25.0000 ug | INTRAMUSCULAR | Status: DC | PRN

## 2021-06-15 MED ORDER — BUPIVACAINE-EPINEPHRINE (PF) 0.25% -1:200000 IJ SOLN
INTRAMUSCULAR | Status: AC
  Filled 2021-06-15: qty 30

## 2021-06-15 MED ORDER — BUPIVACAINE LIPOSOME 1.3 % IJ SUSP
INTRAMUSCULAR | Status: AC
  Filled 2021-06-15: qty 20

## 2021-06-15 MED ORDER — SODIUM CHLORIDE FLUSH 0.9 % IV SOLN
INTRAVENOUS | Status: AC
  Filled 2021-06-15: qty 3

## 2021-06-15 MED ORDER — CEFAZOLIN SODIUM-DEXTROSE 2-4 GM/100ML-% IV SOLN
2.0000 g | INTRAVENOUS | Status: AC
  Administered 2021-06-15: 2 g via INTRAVENOUS

## 2021-06-15 MED ORDER — BUPIVACAINE-EPINEPHRINE 0.25% -1:200000 IJ SOLN
INTRAMUSCULAR | Status: DC | PRN
  Administered 2021-06-15: 30 mL

## 2021-06-15 MED ORDER — DEXMEDETOMIDINE (PRECEDEX) IN NS 20 MCG/5ML (4 MCG/ML) IV SYRINGE
PREFILLED_SYRINGE | INTRAVENOUS | Status: DC | PRN
  Administered 2021-06-15 (×2): 10 ug via INTRAVENOUS

## 2021-06-15 MED ORDER — FENTANYL CITRATE (PF) 100 MCG/2ML IJ SOLN
INTRAMUSCULAR | Status: DC | PRN
  Administered 2021-06-15 (×2): 50 ug via INTRAVENOUS

## 2021-06-15 MED ORDER — OXYCODONE HCL 5 MG PO TABS: 5.0000 mg | ORAL_TABLET | ORAL | 0 refills | Status: DC | PRN

## 2021-06-15 MED ORDER — IPRATROPIUM-ALBUTEROL 0.5-2.5 (3) MG/3ML IN SOLN
RESPIRATORY_TRACT | Status: AC
  Filled 2021-06-15: qty 3

## 2021-06-15 MED ORDER — CHLORHEXIDINE GLUCONATE 0.12 % MT SOLN: 15.0000 mL | Freq: Once | OROMUCOSAL | Status: AC

## 2021-06-15 MED ORDER — BUPIVACAINE LIPOSOME 1.3 % IJ SUSP
INTRAMUSCULAR | Status: DC | PRN
  Administered 2021-06-15: 20 mL

## 2021-06-15 MED ORDER — CHLORHEXIDINE GLUCONATE CLOTH 2 % EX PADS: 6.0000 | MEDICATED_PAD | Freq: Once | CUTANEOUS | Status: DC

## 2021-06-15 MED ORDER — OXYCODONE HCL 5 MG/5ML PO SOLN: 5.0000 mg | Freq: Once | ORAL | Status: AC | PRN

## 2021-06-15 MED ORDER — LACTATED RINGERS IV SOLN: INTRAVENOUS | Status: DC

## 2021-06-15 MED ORDER — ORAL CARE MOUTH RINSE: 15.0000 mL | Freq: Once | OROMUCOSAL | Status: AC

## 2021-06-15 MED ORDER — BUPIVACAINE LIPOSOME 1.3 % IJ SUSP
20.0000 mL | Freq: Once | INTRAMUSCULAR | Status: DC
Start: 2021-06-15 — End: 2021-06-15

## 2021-06-15 MED ORDER — ACETAMINOPHEN 10 MG/ML IV SOLN: 1000.0000 mg | Freq: Once | INTRAVENOUS | Status: DC | PRN

## 2021-06-15 MED ORDER — KETAMINE HCL 10 MG/ML IJ SOLN
INTRAMUSCULAR | Status: DC | PRN
  Administered 2021-06-15 (×2): 25 mg via INTRAVENOUS
  Administered 2021-06-15: 50 mg via INTRAVENOUS

## 2021-06-15 MED ORDER — ROCURONIUM BROMIDE 100 MG/10ML IV SOLN
INTRAVENOUS | Status: DC | PRN
  Administered 2021-06-15: 20 mg via INTRAVENOUS
  Administered 2021-06-15: 50 mg via INTRAVENOUS

## 2021-06-15 MED ORDER — SUCCINYLCHOLINE CHLORIDE 200 MG/10ML IV SOSY
PREFILLED_SYRINGE | INTRAVENOUS | Status: DC | PRN
  Administered 2021-06-15: 110 mg via INTRAVENOUS

## 2021-06-15 MED ORDER — FAMOTIDINE 20 MG PO TABS: 20.0000 mg | ORAL_TABLET | Freq: Once | ORAL | Status: AC

## 2021-06-15 MED ORDER — PHENYLEPHRINE HCL (PRESSORS) 10 MG/ML IV SOLN
INTRAVENOUS | Status: DC | PRN
  Administered 2021-06-15: 100 ug via INTRAVENOUS
  Administered 2021-06-15: 150 ug via INTRAVENOUS
  Administered 2021-06-15: 100 ug via INTRAVENOUS
  Administered 2021-06-15: 150 ug via INTRAVENOUS

## 2021-06-15 MED ORDER — GABAPENTIN 300 MG PO CAPS
ORAL_CAPSULE | ORAL | Status: AC
  Administered 2021-06-15: 300 mg via ORAL
  Filled 2021-06-15: qty 1

## 2021-06-15 MED ORDER — IPRATROPIUM-ALBUTEROL 0.5-2.5 (3) MG/3ML IN SOLN
3.0000 mL | Freq: Once | RESPIRATORY_TRACT | Status: AC
  Administered 2021-06-15: 3 mL via RESPIRATORY_TRACT

## 2021-06-15 MED ORDER — ACETAMINOPHEN 500 MG PO TABS
1000.0000 mg | ORAL_TABLET | Freq: Four times a day (QID) | ORAL | Status: DC | PRN
Start: 2021-06-15 — End: 2022-07-20

## 2021-06-15 MED ORDER — LIDOCAINE HCL (CARDIAC) PF 100 MG/5ML IV SOSY
PREFILLED_SYRINGE | INTRAVENOUS | Status: DC | PRN
  Administered 2021-06-15: 100 mg via INTRAVENOUS

## 2021-06-15 MED ORDER — SUGAMMADEX SODIUM 500 MG/5ML IV SOLN
INTRAVENOUS | Status: DC | PRN
  Administered 2021-06-15: 250 mg via INTRAVENOUS

## 2021-06-15 MED ORDER — OXYCODONE HCL 5 MG PO TABS
ORAL_TABLET | ORAL | Status: AC
  Filled 2021-06-15: qty 1

## 2021-06-15 MED ORDER — FAMOTIDINE 20 MG PO TABS
ORAL_TABLET | ORAL | Status: AC
  Administered 2021-06-15: 20 mg via ORAL
  Filled 2021-06-15: qty 1

## 2021-06-15 MED ORDER — ACETAMINOPHEN 500 MG PO TABS
1000.0000 mg | ORAL_TABLET | ORAL | Status: AC
Start: 2021-06-15 — End: 2021-06-15

## 2021-06-15 MED ORDER — IBUPROFEN 800 MG PO TABS: 800.0000 mg | ORAL_TABLET | Freq: Three times a day (TID) | ORAL | 1 refills | Status: DC | PRN

## 2021-06-15 MED ORDER — FENTANYL CITRATE (PF) 100 MCG/2ML IJ SOLN
INTRAMUSCULAR | Status: AC
  Administered 2021-06-15: 25 ug via INTRAVENOUS
  Filled 2021-06-15: qty 2

## 2021-06-15 MED ORDER — GABAPENTIN 300 MG PO CAPS: 300.0000 mg | ORAL_CAPSULE | ORAL | Status: AC

## 2021-06-15 MED ORDER — KETOROLAC TROMETHAMINE 30 MG/ML IJ SOLN
INTRAMUSCULAR | Status: DC | PRN
  Administered 2021-06-15: 30 mg via INTRAVENOUS

## 2021-06-15 MED ORDER — CEFAZOLIN SODIUM-DEXTROSE 2-4 GM/100ML-% IV SOLN
INTRAVENOUS | Status: AC
  Filled 2021-06-15: qty 100

## 2021-06-15 MED ORDER — DEXAMETHASONE SODIUM PHOSPHATE 10 MG/ML IJ SOLN
INTRAMUSCULAR | Status: DC | PRN
  Administered 2021-06-15: 10 mg via INTRAVENOUS

## 2021-06-15 SURGICAL SUPPLY — 58 items
ADH SKN CLS APL DERMABOND .7 (GAUZE/BANDAGES/DRESSINGS) ×2
APL PRP STRL LF DISP 70% ISPRP (MISCELLANEOUS) ×2
BINDER ABDOMINAL 12 ML 46-62 (SOFTGOODS) ×3 IMPLANT
BLADE SURG SZ11 CARB STEEL (BLADE) ×3 IMPLANT
CANNULA REDUC XI 12-8 STAPL (CANNULA) ×1
CANNULA REDUCER 12-8 DVNC XI (CANNULA) ×2 IMPLANT
CHLORAPREP W/TINT 26 (MISCELLANEOUS) ×3 IMPLANT
COVER TIP SHEARS 8 DVNC (MISCELLANEOUS) ×2 IMPLANT
COVER TIP SHEARS 8MM DA VINCI (MISCELLANEOUS) ×1
DEFOGGER SCOPE WARMER CLEARIFY (MISCELLANEOUS) ×3 IMPLANT
DERMABOND ADVANCED (GAUZE/BANDAGES/DRESSINGS) ×1
DERMABOND ADVANCED .7 DNX12 (GAUZE/BANDAGES/DRESSINGS) ×2 IMPLANT
DRAPE ARM DVNC X/XI (DISPOSABLE) ×8 IMPLANT
DRAPE COLUMN DVNC XI (DISPOSABLE) ×2 IMPLANT
DRAPE DA VINCI XI ARM (DISPOSABLE) ×4
DRAPE DA VINCI XI COLUMN (DISPOSABLE) ×1
ELECT CAUTERY BLADE TIP 2.5 (TIP) ×3
ELECT REM PT RETURN 9FT ADLT (ELECTROSURGICAL) ×3
ELECTRODE CAUTERY BLDE TIP 2.5 (TIP) ×2 IMPLANT
ELECTRODE REM PT RTRN 9FT ADLT (ELECTROSURGICAL) ×2 IMPLANT
GAUZE 4X4 16PLY ~~LOC~~+RFID DBL (SPONGE) ×3 IMPLANT
GLOVE SURG SYN 7.0 (GLOVE) ×9 IMPLANT
GLOVE SURG SYN 7.5  E (GLOVE) ×3
GLOVE SURG SYN 7.5 E (GLOVE) ×6 IMPLANT
GOWN STRL REUS W/ TWL LRG LVL3 (GOWN DISPOSABLE) ×6 IMPLANT
GOWN STRL REUS W/TWL LRG LVL3 (GOWN DISPOSABLE) ×9
GRASPER SUT TROCAR 14GX15 (MISCELLANEOUS) ×3 IMPLANT
IRRIGATION STRYKERFLOW (MISCELLANEOUS) IMPLANT
IRRIGATOR STRYKERFLOW (MISCELLANEOUS)
IV NS 1000ML (IV SOLUTION)
IV NS 1000ML BAXH (IV SOLUTION) IMPLANT
KIT PINK PAD W/HEAD ARE REST (MISCELLANEOUS) ×3
KIT PINK PAD W/HEAD ARM REST (MISCELLANEOUS) ×2 IMPLANT
LABEL OR SOLS (LABEL) ×3 IMPLANT
MANIFOLD NEPTUNE II (INSTRUMENTS) ×3 IMPLANT
MESH VENT LT ST 11.4CM CRL (Mesh General) ×3 IMPLANT
NEEDLE HYPO 22GX1.5 SAFETY (NEEDLE) ×3 IMPLANT
NEEDLE INSUFFLATION 14GA 120MM (NEEDLE) ×3 IMPLANT
OBTURATOR OPTICAL STANDARD 8MM (TROCAR) ×1
OBTURATOR OPTICAL STND 8 DVNC (TROCAR) ×2
OBTURATOR OPTICALSTD 8 DVNC (TROCAR) ×2 IMPLANT
PACK LAP CHOLECYSTECTOMY (MISCELLANEOUS) ×3 IMPLANT
PENCIL ELECTRO HAND CTR (MISCELLANEOUS) ×3 IMPLANT
SEAL CANN UNIV 5-8 DVNC XI (MISCELLANEOUS) ×4 IMPLANT
SEAL XI 5MM-8MM UNIVERSAL (MISCELLANEOUS) ×2
SET TUBE SMOKE EVAC HIGH FLOW (TUBING) ×3 IMPLANT
SOLUTION ELECTROLUBE (MISCELLANEOUS) ×3 IMPLANT
SPONGE T-LAP 18X18 ~~LOC~~+RFID (SPONGE) ×3 IMPLANT
STAPLER CANNULA SEAL DVNC XI (STAPLE) ×2 IMPLANT
STAPLER CANNULA SEAL XI (STAPLE) ×1
SUT MNCRL 4-0 (SUTURE) ×6
SUT MNCRL 4-0 27XMFL (SUTURE) ×4
SUT STRATAFIX PDS 30 CT-1 (SUTURE) ×3 IMPLANT
SUT VICRYL 0 AB UR-6 (SUTURE) ×6 IMPLANT
SUT VLOC 90 2/L VL 12 GS22 (SUTURE) ×6 IMPLANT
SUTURE MNCRL 4-0 27XMF (SUTURE) ×4 IMPLANT
TRAY FOLEY SLVR 16FR LF STAT (SET/KITS/TRAYS/PACK) ×3 IMPLANT
WATER STERILE IRR 500ML POUR (IV SOLUTION) IMPLANT

## 2021-06-15 NOTE — Anesthesia Preprocedure Evaluation (Addendum)
Anesthesia Evaluation  Patient identified by MRN, date of birth, ID band Patient awake    Reviewed: Allergy & Precautions, NPO status , Patient's Chart, lab work & pertinent test results  Airway Mallampati: III  TM Distance: >3 FB Neck ROM: full    Dental no notable dental hx.    Pulmonary shortness of breath, asthma , sleep apnea ,    Pulmonary exam normal        Cardiovascular Exercise Tolerance: Poor + DOE  Normal cardiovascular exam  ECHO: 1. The left ventricle has normal systolic function, with an ejection  fraction of 55-60%. Left ventricular diastolic Doppler parameters are  consistent with impaired relaxation.  2. The right ventricle has normal systolc function.   FINDINGS  Left Ventricle: The left ventricle has normal systolic function, with an  ejection fraction of 55-60%. Left ventricular diastolic Doppler parameters  are consistent with impaired relaxation (grade I).  Right Ventricle: The right ventricle has normal systolic function.    Neuro/Psych negative neurological ROS  negative psych ROS   GI/Hepatic negative GI ROS, Neg liver ROS, Incarcerated Umbilical and ventral hernia   Endo/Other  negative endocrine ROSDiabetes: Prediabetes.Morbid obesity  Renal/GU      Musculoskeletal negative musculoskeletal ROS (+)   Abdominal (+) + obese,   Peds  Hematology negative hematology ROS (+)   Anesthesia Other Findings Past Medical History: No date: Asthma No date: CHF (congestive heart failure) (HCC) No date: Dyspnea No date: Hyperlipidemia No date: Pneumonia No date: Pre-diabetes No date: Sleep apnea  Past Surgical History: No date: NO PAST SURGERIES No date: No PAST SURGICAL HISTORY  BMI    Body Mass Index: 47.07 kg/m      Reproductive/Obstetrics negative OB ROS                           Anesthesia Physical Anesthesia Plan  ASA: 3  Anesthesia Plan: General ETT    Post-op Pain Management:    Induction: Intravenous  PONV Risk Score and Plan: 3 and Ondansetron, Dexamethasone, Midazolam and Treatment may vary due to age or medical condition  Airway Management Planned: Oral ETT  Additional Equipment:   Intra-op Plan:   Post-operative Plan: Extubation in OR  Informed Consent: I have reviewed the patients History and Physical, chart, labs and discussed the procedure including the risks, benefits and alternatives for the proposed anesthesia with the patient or authorized representative who has indicated his/her understanding and acceptance.     Dental Advisory Given  Plan Discussed with: Anesthesiologist, CRNA and Surgeon  Anesthesia Plan Comments:        Anesthesia Quick Evaluation

## 2021-06-15 NOTE — Discharge Instructions (Addendum)
     CIRUGIA AMBULATORIA       Instruccionnes de alta    Date (Fecha)    1.  Las drogas que se Dispensing optician en su cuerpo [employer], asi            que por las proximas 24 horas usted no debe:   Conducir Field seismologist) un automovil   Hacer ninguna decision legal   Tomar ninguna bebida alcoholica  2.  A) [place] puede comenzar una dieta regular.  Es mejor que hoy empiece con           liquidos y gradualmente anada [address]       B) Puede comer cualquier comida que desee pero es mejor empezar con liquidos,                      luego sopitas con galletas saladas y gradualmente llegar a las comidas solidas.  3.  Por favor avise a su medico inmediatamente si usted tiene algun sangrado anormal,       tiene dificultad con la respiracion, enrojecimiento y Engineer, mining en el sitio de la cirugia, [place],       fiebro o dolor que se alivia con [place].  4.  A) Su visita posoperatoria (despues de su operacion) es con el  Dr.   Date                     Time         B)  Por favor llame para hacer la cita posoperatoria.  5.  Istrucciones especificas :

## 2021-06-15 NOTE — H&P (Signed)
06/15/2021   History of Present Illness: Veronica Castaneda is a 60 y.o. female presenting for follow-up of umbilical hernia.  The patient was last seen about 2 months ago at which time I have encouraged the patient to try to lose weight as this would only help with the hernia repair.  She reports that over the last 2 months, she has lost maybe about 7 pounds.  She has tried to walk more by one of her limiting factors is back pain.  She reports that she feels the hernia is about the same size but she is also having some discomfort that radiates towards the epigastric region in the midline particularly when she is straining.  Sometimes when the hernia bulges more, she feels like the area is harder.   Past Medical History:     Past Medical History:  Diagnosis Date   Asthma     CHF (congestive heart failure) (HCC)     Dyspnea     Hyperlipidemia     Pneumonia     Sleep apnea        Past Surgical History:      Past Surgical History:  Procedure Laterality Date   NO PAST SURGERIES       No PAST SURGICAL HISTORY          Home Medications:        Prior to Admission medications   Medication Sig Start Date End Date Taking? Authorizing Provider  albuterol (PROVENTIL) (2.5 MG/3ML) 0.083% nebulizer solution TAKE 3 MLS (2.5 MG TOTAL) BY NEBULIZATION EVERY 6 (SIX) HOURS AS NEEDED FOR WHEEZING OR SHORTNESS OF BREATH. 11/24/20 11/24/21 Yes Marcine Matar, MD  albuterol (VENTOLIN HFA) 108 (90 Base) MCG/ACT inhaler INHALE 2 PUFFS INTO THE LUNGS EVERY 4 (FOUR) HOURS AS NEEDED FOR WHEEZING OR SHORTNESS OF BREATH. 06/28/20 06/28/21 Yes Marcine Matar, MD  atorvastatin (LIPITOR) 10 MG tablet TAKE 1 TABLET (10 MG TOTAL) BY MOUTH DAILY. 03/30/21 03/30/22 Yes Marcine Matar, MD  cyclobenzaprine (FLEXERIL) 5 MG tablet TAKE 1 TABLET BY MOUTH 3 TIMES DAILY AS NEEDED FOR MUSCLE SPASMS. DO NOT DRINK ALCOHOL OR DRIVE WHILE TAKING THIS MEDICATION. 10/17/20 10/17/21 Yes Particia Nearing, PA-C   Fluticasone-Umeclidin-Vilant 100-62.5-25 MCG/INH AEPB INHALE 1 PUFF INTO THE LUNGS DAILY. 11/24/20 11/24/21 Yes Marcine Matar, MD  furosemide (LASIX) 20 MG tablet TAKE 1 TABLET (20 MG TOTAL) BY MOUTH DAILY AS NEEDED FOR LEG SELLING. DO NOT TAKE MORE THAN 1 TABLET PER DAY 10/14/20 10/14/21 Yes Marcine Matar, MD  metFORMIN (GLUCOPHAGE) 500 MG tablet TAKE 1/2 TABLET (250 MG TOTAL) BY MOUTH DAILY WITH BREAKFAST. 10/14/20 10/14/21 Yes Marcine Matar, MD  Multiple Vitamin (MULTIVITAMIN WITH MINERALS) TABS tablet Take 1 tablet by mouth daily. 05/24/20   Yes Swayze, Ava, DO  polyethylene glycol (MIRALAX / GLYCOLAX) 17 g packet Take 17 g by mouth daily. 05/24/20   Yes Swayze, Ava, DO      Allergies: No Known Allergies   Review of Systems: Review of Systems  Constitutional:  Negative for chills and fever.  HENT:  Negative for hearing loss.   Respiratory:  Negative for shortness of breath.   Cardiovascular:  Negative for chest pain.  Gastrointestinal:  Positive for abdominal pain (periumbilical and epigastric). Negative for nausea and vomiting.  Genitourinary:  Negative for dysuria.  Musculoskeletal:  Positive for back pain.  Skin:  Negative for rash.  Neurological:  Negative for dizziness.  Psychiatric/Behavioral:  Negative for depression.  Physical Exam BP 116/74   Pulse 80   Temp 99.4 F (37.4 C) (Oral)   Ht 4\' 11"  (1.499 m)   Wt 242 lb 3.2 oz (109.9 kg)   SpO2 93%   BMI 48.92 kg/m  CONSTITUTIONAL: No acute distress HEENT:  Normocephalic, atraumatic, extraocular motion intact. NECK: Trachea is midline without any jugular venous distention. RESPIRATORY:  Lungs are clear, and breath sounds are equal bilaterally. Normal respiratory effort without pathologic use of accessory muscles. CARDIOVASCULAR: Heart is regular without murmurs, gallops, or rubs. GI: The abdomen is soft, obese, nondistended, with mild discomfort to palpation at the umbilicus and just superior to it.  On today's  exam, the patient appears to have actually 2 adjacent hernias, one which is in the superior portion of the umbilicus and another one that is just above that.  The patient appears to have lobulated fat within the hernias.  On today's exam, I am unable to reduce them. NEUROLOGIC:  Motor and sensation is grossly normal.  Cranial nerves are grossly intact. PSYCH:  Alert and oriented to person, place and time. Affect is normal.   Labs/Imaging: Labs from 11/24/2020: Sodium 137, potassium 4.5, chloride 97, CO2 26, BUN 11, creatinine 0.59.  Total bilirubin 0.3, AST 13, ALT 10, alkaline phosphatase 126.  Total cholesterol 276, triglycerides 298, LDL 177, HDL 41.  WBC 6.7, hemoglobin 14.8, hematocrit 44.8, platelets 221.  Hemoglobin A1c 5.7.   CT scan of abdomen/pelvis on 12/01/2020: IMPRESSION: 1. Lobulated fat containing umbilical hernia with associated surrounding subcutaneus soft tissue edema. No mesenteric fat changes to suggest mesenteric ischemia. No associated bowel obstruction. Correlate with physical exam for incarceration. 2. Otherwise no acute intra-abdominal or intrapelvic abnormality.     Assessment and Plan: This is a 60 y.o. female with an incarcerated umbilical and ventral hernia.  --Patient has been cleared by PCP and cardiology for surgery.  Plan for robotic assisted ventral hernia repair with mesh.  Discussed surgery again with the patient including risks of bleeding, infection, injury to surrounding structures, post-op recovery, activity restrictions, and she's willing to proceed.     60, MD [facility]

## 2021-06-15 NOTE — Progress Notes (Signed)
Dr. Laural Benes informed of pt oxygen status dropping to mid 80's. He came to bedside to assess pt. New orders for breathing treatment. No increased WOB. Lung sounds clear. No signs of fluid overload. Pt states she has pulse ox at home and usually runs 90-94%. States she has a cpap at home but doesn't always use it. Dr. Laural Benes states pt can be DC home as long as oxygen sats 88 or above. Informed Surveyor, quantity.

## 2021-06-15 NOTE — Progress Notes (Addendum)
This RN ambulated patient on 3L [place] with pulse ox. Pt was 88-93% while ambulating. C/o of dizziness/shakiness while ambulating. Denied pain, SOB, or being cold while ambulating. Pt states she has pulse ox at home and her oxygen usually runs 90-94%. Wears cpap at night for sleep apnea but states not every night. Pt remains sleepy in pacu when not stimulated. Dr. Laural Benes informed.

## 2021-06-15 NOTE — Anesthesia Postprocedure Evaluation (Signed)
Anesthesia Post Note  Patient: Veronica Castaneda  Procedure(s) Performed: XI ROBOTIC ASSISTED VENTRAL HERNIA INSERTION OF MESH  Anesthesia Type: General Anesthetic complications: no   No notable events documented.  Breathing treatment  Last Vitals:  Vitals:   06/15/21 1310 06/15/21 1315  BP:  107/66  Pulse: 69 74  Resp: 11 11  Temp:    SpO2: 98% 96%    Last Pain:  Vitals:   06/15/21 1245  TempSrc:   PainSc: 0-No pain                 Foye Deer

## 2021-06-15 NOTE — Op Note (Signed)
Procedure Date:  06/15/2021  Pre-operative Diagnosis:  Incarcerated Umbilical and supraumbilical hernias  Post-operative Diagnosis: IncarceratedUmbilical and supraumbilical hernias  Procedure:  Robotic assisted Incarcerated Ventral Hernia Repair with mesh  Surgeon:  Howie Ill, MD  Anesthesia:  General endotracheal  Estimated Blood Loss:  10 ml  Specimens:  None  Complications:  None  Indications for Procedure:  This is a 60 y.o. female who presents with an incarcerated umbilical and supraumbilical hernia.  The options of surgery versus observation were reviewed with the patient and/or family. The risks of bleeding, abscess or infection, recurrence of symptoms, potential for an open procedure, injury to surrounding structures, and chronic pain were all discussed with the patient and was willing to proceed.  Description of Procedure: The patient was correctly identified in the preoperative area and brought into the operating room.  The patient was placed supine with VTE prophylaxis in place.  Appropriate time-outs were performed.  Anesthesia was induced and the patient was intubated.  Appropriate antibiotics were infused.  The abdomen was prepped and draped in a sterile fashion. The patient's hernia defect was marked with a marking pen.  A Veress needle was introduced in the left upper quadrant and pneumoperitoneum was obtained with appropriate pressures.  Using Optivue technique, an 8 mm robotic port was introduced in the left lateral abdomen without complications.  Then an additional 8-mm and one 12-mm port were placed along the left lateral side without complications under direct visualization.  Upon initial inspection, the patient had omentum within the hernia defects.  A 0 Stratafix suture, two 2-0 V-loc sutures, and a 4.5 inch Bard Ventralight ST Echo mesh were placed through the 12 mm port under direct visualization.  The DaVinci platform was docked, camera targeted, and  instruments placed under direct visualization.  The incarcerated omentum was then carefully dissected and released from the hernia sac and defect using combination of blunt dissection and cautery.  One section of the omentum was fully cut as it had some openings that could lead to an internal hernia.  Once the omentum was fully reduced, we then proceeded to dissect along the hernia defects to clean the edges off the preperitoneal fat, so the mesh would adhere better.  Then the two hernia defects, which were adjacent to each other, were closed using a running 0 Stratafix suture, incorporating part of the hernia sac to close the space better.  Then, PMI was placed through the middle of the suture line and the positioning system was retrieved and insufflated.  The mesh was noted to be well centered and covered the entire defect with good overlap.  This was then sutured in place using running 2-0 vloc sutures.  All needles were then removed, balllon system removed, and omentum removed through the 12 mm port.  The DaVinci platform was undocked.  50 ml of Exparel mixed with 0.5% bupivacaine with epi was then infiltrated around the mesh suture line, the defect suture line, and the ports.  The 12-mm port was removed and the fascia was closed under direct visualization utilizing an Endo Close technique with 0 Vicryl suture.  The 8 mm ports were removed.  The 12 mm incision was closed in layers using 3-0 Vicryl and 4-0 Monocryl, and the remaining incisions were closed with 4-0 Monocryl.  The wounds were cleaned and sealed with DermaBond.  The patient was emerged from anesthesia and extubated and brought to the recovery room for further management.  The patient tolerated the procedure well and all  counts were correct at the end of the case.   Melvyn Neth, MD

## 2021-06-15 NOTE — Transfer of Care (Signed)
Immediate Anesthesia Transfer of Care Note  Patient: Veronica Castaneda  Procedure(s) Performed: XI ROBOTIC ASSISTED VENTRAL HERNIA INSERTION OF MESH  Patient Location: PACU  Anesthesia Type:General  Level of Consciousness: awake, drowsy and patient cooperative  Airway & Oxygen Therapy: Patient Spontanous Breathing and Patient connected to face mask oxygen  Post-op Assessment: Report given to RN and Post -op Vital signs reviewed and stable  Post vital signs: Reviewed and stable  Last Vitals:  Vitals Value Taken Time  BP 115/62 06/15/21 1054  Temp    Pulse 90 06/15/21 1059  Resp 21 06/15/21 1059  SpO2 94 % 06/15/21 1059  Vitals shown include unvalidated device data.  Last Pain:  Vitals:   06/15/21 0639  TempSrc: Temporal  PainSc: 0-No pain         Complications: No notable events documented.

## 2021-06-15 NOTE — Anesthesia Procedure Notes (Addendum)
Procedure Name: Intubation Date/Time: 06/15/2021 7:42 AM Performed by: Reece Agar, CRNA Pre-anesthesia Checklist: Patient identified, Emergency Drugs available, Suction available and Patient being monitored Patient Re-evaluated:Patient Re-evaluated prior to induction Oxygen Delivery Method: Circle system utilized Preoxygenation: Pre-oxygenation with 100% oxygen Induction Type: IV induction Ventilation: Oral airway inserted - appropriate to patient size and Mask ventilation without difficulty Laryngoscope Size: McGraph and 3 Grade View: Grade II Tube type: Oral Tube size: 7.0 mm Number of attempts: 1 Airway Equipment and Method: Stylet, Oral airway and Patient positioned with wedge pillow Placement Confirmation: ETT inserted through vocal cords under direct vision, positive ETCO2 and breath sounds checked- equal and bilateral Secured at: 20 cm Tube secured with: Tape Dental Injury: Teeth and Oropharynx as per pre-operative assessment

## 2021-06-16 ENCOUNTER — Other Ambulatory Visit: Payer: Self-pay

## 2021-06-16 ENCOUNTER — Encounter: Payer: Self-pay | Admitting: Surgery

## 2021-06-20 ENCOUNTER — Other Ambulatory Visit: Payer: Self-pay

## 2021-06-26 NOTE — Telephone Encounter (Signed)
Called pt stated unable to read the model  and will have someone coming to the house to help her read. She will make a call to the office and f/u

## 2021-06-30 ENCOUNTER — Other Ambulatory Visit: Payer: Self-pay

## 2021-06-30 ENCOUNTER — Ambulatory Visit (INDEPENDENT_AMBULATORY_CARE_PROVIDER_SITE_OTHER): Payer: Self-pay | Admitting: Surgery

## 2021-06-30 ENCOUNTER — Encounter: Payer: Self-pay | Admitting: Surgery

## 2021-06-30 VITALS — BP 109/63 | HR 79 | Temp 98.3°F | Ht <= 58 in | Wt 244.0 lb

## 2021-06-30 DIAGNOSIS — K439 Ventral hernia without obstruction or gangrene: Secondary | ICD-10-CM

## 2021-06-30 DIAGNOSIS — Z09 Encounter for follow-up examination after completed treatment for conditions other than malignant neoplasm: Secondary | ICD-10-CM

## 2021-06-30 NOTE — Progress Notes (Signed)
06/30/2021  HPI: Veronica Castaneda is a 60 y.o. female s/p robotic assisted ventral hernia repair on 06/15/21.  The patient had two defects that were repaired.  She presents today for follow up.  She reports that she has intermittent discomfort/pain at the umbilical area and the left upper incision, but this has been improving overall.  Denies any nausea, vomiting.  Vital signs: BP 109/63   Pulse 79   Temp 98.3 F (36.8 C)   Ht 4\' 10"  (1.473 m)   Wt 244 lb (110.7 kg)   SpO2 92%   BMI 51.00 kg/m    Physical Exam: Constitutional:  No acute distress Abdomen:  Soft, non-distended, appropriately sore/tender to palpation.  Left sided incisions are healing well and are clean, dry, intact.  Midline area shows area of firmness at the hernia repair sites consistent with sutures and scar tissue.  No evidence of recurrence.  Assessment/Plan: This is a 60 y.o. female s/p robotic assisted ventral hernias repair.  --Patient is healing appropriately.  No evidence of complications at this point.  Recommended that she continue wearing her abdominal binder for an additional 2 weeks, and also to avoid heavy lifting/pushing for another 2 weeks. --Follow up as needed.   60, MD [facility]

## 2021-06-30 NOTE — Patient Instructions (Signed)
llmanos si tienes alguna duda o problema seguimiento si es necesario  Reparacin abierta de una hernia, en adultos, cuidados posteriores Open Hernia Repair, Adult, Care After La siguiente informacin ofrece orientacin sobre cmo cuidarse despus del procedimiento. El mdico tambin podr darle instrucciones ms especficas. Comunquese con el mdico si tiene problemas o preguntas. Qu puedo esperar despus del procedimiento? Despus del procedimiento, es normal tener los siguientes sntomas: Molestias leves. Moretones leves. Algo de hinchazn. Dolor en el abdomen. Una pequea cantidad de sangre proveniente de la incisin. Siga estas instrucciones en su casa: Medicamentos Use los medicamentos de venta libre y los recetados solamente como se lo haya indicado el mdico. Pregntele al mdico si el medicamento recetado: Hace necesario que evite conducir o usar [place]. Puede causarle estreimiento. Es posible que tenga que tomar estas medidas para prevenir o tratar el estreimiento: Product manager suficiente lquido como para Pharmacologist la orina de color amarillo plido. Usar medicamentos recetados o de Sales promotion account executive. Consumir alimentos ricos en fibra, como frijoles, cereales integrales, y frutas y verduras frescas. Limitar el consumo de alimentos ricos en grasa y azcares procesados, como los alimentos fritos o dulces. Si le recetaron un antibitico, tmelo como se lo haya indicado el mdico. No deje de usar el antibitico, aunque comience a Actor. Cuidado de la incisin  Siga las instrucciones del mdico acerca del cuidado de la incisin. Asegrese de hacer lo siguiente: Lvese las manos con agua y jabn durante al menos 20 segundos antes y despus de cambiarse la venda (vendaje). Use desinfectante para manos si no dispone de [place] y [place]. Cambie el vendaje como se lo haya indicado el mdico. No retire los puntos (suturas), la goma para cerrar la piel o las tiras [place]. Es posible que  estos cierres cutneos deban quedar puestos en la piel durante 2 semanas o ms tiempo. Si los bordes de las tiras [address] empiezan a despegarse y Scientific laboratory technician, puede recortar los que estn sueltos. No retire las tiras [employer] por completo a menos que el mdico se lo indique. Controle la zona de la incisin todos los [address]  [address] para detectar signos de infeccin. Est atento a los siguientes signos: Aumento del enrojecimiento, la hinchazn o Chief Technology Officer. Ms lquido [place]. Calor. Pus o mal olor. Use ropa holgada y [employer] la incisin se haya curado. Actividad  Haga reposo como se lo haya indicado el mdico. No levante ningn objeto que pese ms de 10 libras (4.5 kg) o que supere el lmite de peso que le hayan indicado, Teacher, adult education que el mdico le diga que puede [place]. No practique deportes de contacto hasta que el mdico le diga que es seguro. Si le administraron un sedante durante el procedimiento, puede afectarlo por varias horas. No conduzca ni opere maquinaria hasta que el mdico le indique que es seguro [place]. Retome sus actividades normales segn lo indicado por el mdico. Pregntele al mdico qu actividades son seguras para usted. Indicaciones generales No tome baos de inmersin, no nade ni use el jacuzzi hasta que el mdico lo autorice. Pregntele al mdico si puede ducharse. Delle Reining solo le permitan darse baos de [place]. Sostenga una [employer] abdomen cuando tosa o estornude. Esto ayuda a Engineer, materials. No consuma ningn producto que contenga nicotina o tabaco. Estos productos incluyen cigarrillos, tabaco para Theatre manager y aparatos de vapeo, como los Administrator, Civil Service. Si necesita ayuda para dejar de consumir estos productos, consulte al [employer]. Cumpla con todas las visitas de seguimiento. Esto es importante. Comunquese con  un mdico si: Tiene cualquiera de estos signos de infeccin: Aumento del enrojecimiento, la hinchazn o el dolor alrededor de la  incisin. Aumento del lquido o sangre proveniente de la incisin. Calor que proviene de la incisin. Pus o mal olor en Immunologist de la incisin. Fiebre o escalofros. Observa sangre en la materia fecal (heces). No defeca durante 2 o 3 das. El dolor no se alivia con los [employer]. Solicite ayuda de inmediato si: Siente falta de aire o dolor en el pecho. Se siente mareado o tiene sensacin de desvanecimiento. Siente dolor intenso. Vomita y Community education officer. Siente dolor o tiene hinchazn o enrojecimiento en la pierna. Estos sntomas pueden representar un problema grave que constituye Radio broadcast assistant. No espere a ver si los sntomas desaparecen. Solicite atencin mdica de inmediato. Comunquese con el servicio de emergencias de su localidad (911 en los [place]). No conduzca por sus propios medios [employer]. Resumen Despus de una reparacin de una hernia abierta, es comn tener [place], moretones e hinchazn leves. Siga las instrucciones del mdico acerca del cuidado de la incisin. Observe si hay signos de infeccin [employer]. No levante objetos pesados ni practique deportes de contacto hasta que el mdico le diga que es seguro. Reanude sus actividades normales segn lo indicado por el mdico. Pregntele al mdico qu actividades son seguras para usted. Esta informacin no tiene Theme park manager el consejo del mdico. Asegrese de hacerle al mdico cualquier pregunta que tenga. Document Revised: 04/28/2020 Document Reviewed: 04/28/2020 Elsevier Patient Education  2022 [employer].

## 2021-07-27 ENCOUNTER — Ambulatory Visit: Payer: Self-pay | Admitting: Cardiology

## 2021-08-04 ENCOUNTER — Ambulatory Visit: Payer: Self-pay | Attending: Internal Medicine | Admitting: Internal Medicine

## 2021-08-04 ENCOUNTER — Other Ambulatory Visit: Payer: Self-pay

## 2021-08-04 DIAGNOSIS — R7303 Prediabetes: Secondary | ICD-10-CM

## 2021-08-04 DIAGNOSIS — Z23 Encounter for immunization: Secondary | ICD-10-CM

## 2021-08-04 DIAGNOSIS — E782 Mixed hyperlipidemia: Secondary | ICD-10-CM

## 2021-08-04 DIAGNOSIS — E662 Morbid (severe) obesity with alveolar hypoventilation: Secondary | ICD-10-CM

## 2021-08-04 LAB — POCT GLYCOSYLATED HEMOGLOBIN (HGB A1C): HbA1c, POC (controlled diabetic range): 6 % (ref 0.0–7.0)

## 2021-08-04 MED ORDER — ATORVASTATIN CALCIUM 10 MG PO TABS
ORAL_TABLET | Freq: Every day | ORAL | 6 refills | Status: DC
Start: 2021-08-04 — End: 2022-03-21
  Filled 2021-08-04 – 2021-11-29 (×3): qty 30, 30d supply, fill #0

## 2021-08-04 NOTE — Progress Notes (Signed)
Patient ID: Veronica Castaneda, female    DOB: [birth year]  MRN: [id number]  CC: chronic ds management  Subjective: Veronica Castaneda is a 60 y.o. female who presents for chronic ds management Her concerns today include:  Pt with morbid obesity, OA LT knee, likely OSA/OHS, prediabetes, mixed HL, persistent asthma, hypoventilation syndrome with likely OSA on BiPAP.   OSA/obesity hypoventilation syndrome:  reports the BiPAP machine no longer works  Morbid Obesity/PreDM:  trying to eat smaller portions.  She drinks almond milk instead of regular milk. Taking and tolerating Metformin.  Not very active.  She is requesting refill on atorvastatin.  Patient Active Problem List   Diagnosis Date Noted   Ventral hernia without obstruction or gangrene    Umbilical hernia without obstruction and without gangrene    Hyperventilation syndrome 11/24/2020   Asthma exacerbation 05/22/2020   Morbid obesity with body mass index (BMI) of 50.0 to 59.9 in adult (HCC) 05/22/2020   Obesity hypoventilation syndrome (HCC) 05/22/2020   OSA (obstructive sleep apnea) 05/22/2020   RSV (respiratory syncytial virus infection) 05/22/2020   Difficult airway for intubation 05/22/2020   Acute respiratory failure with hypoxia (HCC) 05/12/2020   Loud snoring 02/11/2020   Daytime sleepiness 02/11/2020   Screening breast examination 10/22/2019   Prediabetes 08/18/2019   Hyperlipidemia 08/18/2019   Morbid obesity (HCC) 08/17/2019     Current Outpatient Medications on File Prior to Visit  Medication Sig Dispense Refill   acetaminophen (TYLENOL) 500 MG tablet Take 2 tablets (1,000 mg total) by mouth every 6 (six) hours as needed for mild pain.     albuterol (PROVENTIL) (2.5 MG/3ML) 0.083% nebulizer solution TAKE 3 MLS (2.5 MG TOTAL) BY NEBULIZATION EVERY 6 (SIX) HOURS AS NEEDED FOR WHEEZING OR SHORTNESS OF BREATH. 75 mL 6   atorvastatin (LIPITOR) 10 MG tablet TAKE 1 TABLET (10 MG TOTAL) BY MOUTH DAILY.  30 tablet 6   furosemide (LASIX) 20 MG tablet TAKE 1 TABLET (20 MG TOTAL) BY MOUTH DAILY AS NEEDED FOR LEG SELLING. DO NOT TAKE MORE THAN 1 TABLET PER DAY 30 tablet 6   ibuprofen (ADVIL) 800 MG tablet Take 1 tablet (800 mg total) by mouth every 8 (eight) hours as needed for moderate pain. 60 tablet 1   metFORMIN (GLUCOPHAGE) 500 MG tablet TAKE 1/2 TABLET (250 MG TOTAL) BY MOUTH DAILY WITH BREAKFAST. 30 tablet 6   Multiple Vitamin (MULTIVITAMIN WITH MINERALS) TABS tablet Take 1 tablet by mouth daily. 30 tablet 0   oxyCODONE (OXY IR/ROXICODONE) 5 MG immediate release tablet Take 1 tablet (5 mg total) by mouth every 4 (four) hours as needed for severe pain. 30 tablet 0   polyethylene glycol (MIRALAX / GLYCOLAX) 17 g packet Take 17 g by mouth daily. (Patient taking differently: Take 17 g by mouth daily as needed for moderate constipation.) 14 each 0   albuterol (VENTOLIN HFA) 108 (90 Base) MCG/ACT inhaler INHALE 2 PUFFS INTO THE LUNGS EVERY 4 (FOUR) HOURS AS NEEDED FOR WHEEZING OR SHORTNESS OF BREATH. 18 g 6   No current facility-administered medications on file prior to visit.    No Known Allergies  Social History   Socioeconomic History   Marital status: Single    Spouse name: Not on file   Number of children: 3   Years of education: Not on file   Highest education level: 2nd grade  Occupational History   Occupation: house wife  Tobacco Use   Smoking status: Never   Smokeless tobacco: Never  Vaping Use   Vaping Use: Never used  Substance and Sexual Activity   Alcohol use: Never   Drug use: Never   Sexual activity: Not Currently  Other Topics Concern   Not on file  Social History Narrative   Not on file   Social Determinants of Health   Financial Resource Strain: Not on file  Food Insecurity: Not on file  Transportation Needs: Unmet Transportation Needs   Lack of Transportation (Medical): Yes   Lack of Transportation (Non-Medical): Yes  Physical Activity: Not on file   Stress: Not on file  Social Connections: Not on file  Intimate Partner Violence: Not on file    Family History  Problem Relation Age of Onset   Heart attack Mother    Heart disease Mother     Past Surgical History:  Procedure Laterality Date   INSERTION OF MESH  06/15/2021   Procedure: INSERTION OF MESH;  Surgeon: Olean Ree, MD;  Location: [facility] ORS;  Service: General;;   NO PAST SURGERIES     No PAST SURGICAL HISTORY     XI ROBOTIC ASSISTED VENTRAL HERNIA N/A 06/15/2021   Procedure: XI ROBOTIC [place];  Surgeon: Olean Ree, MD;  Location: [facility] ORS;  Service: General;  Laterality: N/A;    ROS: Review of Systems Negative except as stated above  PHYSICAL EXAM: BP 105/71 (BP Location: Left Arm, Patient Position: Sitting, Cuff Size: Large)   Pulse 76   Ht 4\' 5"  (1.346 m)   Wt 244 lb 12.8 oz (111 kg)   SpO2 92%   BMI 61.27 kg/m   Wt Readings from Last 3 Encounters:  08/04/21 244 lb 12.8 oz (111 kg)  06/30/21 244 lb (110.7 kg)  06/15/21 241 lb (109.3 kg)    Physical Exam  General appearance - alert, well appearing, and in no distress Mental status - normal mood, behavior, speech, dress, motor activity, and thought processes Chest - clear to auscultation, no wheezes, rales or rhonchi, symmetric air entry Heart - normal rate, regular rhythm, normal S1, S2, no murmurs, rubs, clicks or gallops Extremities - peripheral pulses normal, no pedal edema, no clubbing or cyanosis  Results for orders placed or performed in visit on 08/04/21  POCT glycosylated hemoglobin (Hb A1C)  Result Value Ref Range   Hemoglobin A1C     HbA1c POC (<> result, manual entry)     HbA1c, POC (prediabetic range)     HbA1c, POC (controlled diabetic range) 6.0 0.0 - 7.0 %     CMP Latest Ref Rng & Units 11/24/2020 05/27/2020 05/21/2020  Glucose 65 - 99 mg/dL 81 107(H) 103(H)  BUN 6 - 24 mg/dL 11 9 31(H)  Creatinine 0.57 - 1.00 mg/dL 0.59 0.49(L) 0.60  Sodium 134 - 144 mmol/L 137  140 145  Potassium 3.5 - 5.2 mmol/L 4.5 4.2 3.5  Chloride 96 - 106 mmol/L 97 102 110  CO2 20 - 29 mmol/L 26 24 25   Calcium 8.7 - 10.2 mg/dL 9.0 9.1 9.1  Total Protein 6.0 - 8.5 g/dL 7.2 - -  Total Bilirubin 0.0 - 1.2 mg/dL 0.3 - -  Alkaline Phos 44 - 121 IU/L 126(H) - -  AST 0 - 40 IU/L 13 - -  ALT 0 - 32 IU/L 10 - -   Lipid Panel     Component Value Date/Time   CHOL 276 (H) 11/24/2020 1102   TRIG 298 (H) 11/24/2020 1102   HDL 41 11/24/2020 1102   CHOLHDL 6.7 (H) 11/24/2020 1102  LDLCALC 177 (H) 11/24/2020 1102    CBC    Component Value Date/Time   WBC 6.7 11/24/2020 1102   WBC 7.4 05/17/2020 0429   RBC 4.66 11/24/2020 1102   RBC 4.59 05/17/2020 0429   HGB 14.8 11/24/2020 1102   HCT 44.8 11/24/2020 1102   PLT 221 11/24/2020 1102   MCV 96 11/24/2020 1102   MCH 31.8 11/24/2020 1102   MCH 27.9 05/17/2020 0429   MCHC 33.0 11/24/2020 1102   MCHC 29.6 (L) 05/17/2020 0429   RDW 12.1 11/24/2020 1102   LYMPHSABS 1.0 05/12/2020 0654   MONOABS 1.0 05/12/2020 0654   EOSABS 0.1 05/12/2020 0654   BASOSABS 0.0 05/12/2020 0654    ASSESSMENT AND PLAN: 1. Morbid obesity ([place]) Discussed and encourage healthy eating habits.  She is agreeable to seeing a nutritionist to assist her. - Amb ref to Medical Nutrition Therapy-MNT  2. Prediabetes See #1 above. - POCT glycosylated hemoglobin (Hb A1C) - Amb ref to Medical Nutrition Therapy-MNT  3. Obesity hypoventilation syndrome ([place]) Advised patient that I will send a message to our caseworker to see whether she can assess with getting her another BiPAP device  4. Mixed hyperlipidemia - atorvastatin (LIPITOR) 10 MG tablet; TAKE 1 TABLET (10 MG TOTAL) BY MOUTH DAILY.  Dispense: 30 tablet; Refill: 6  5. Need for immunization against influenza - Flu Vaccine QUAD 75mo+IM (Fluarix, Fluzone & Alfiuria Quad PF) We will plan to give the Prevnar 20 on her next visit as we are currently out of it.   Patient was given the opportunity to ask  questions.  Patient verbalized understanding of the plan and was able to repeat key elements of the plan.   Orders Placed This Encounter  Procedures   POCT glycosylated hemoglobin (Hb A1C)     Requested Prescriptions    No prescriptions requested or ordered in this encounter    No follow-ups on file.  Karle Plumber, MD, FACP

## 2021-08-07 ENCOUNTER — Other Ambulatory Visit: Payer: Self-pay

## 2021-08-07 NOTE — Telephone Encounter (Signed)
Per Dr Laural Benes, her BiPAP unit is no longer working.  Will need to check with [facility] to see if they can repair it. If not, a referral can be made to the [employer] for a refurbished machine.

## 2021-08-08 ENCOUNTER — Other Ambulatory Visit: Payer: Self-pay

## 2021-08-09 ENCOUNTER — Telehealth: Payer: Self-pay

## 2021-08-09 NOTE — Telephone Encounter (Signed)
Message received from Dr Laural Benes noting patient's BiPAP machine no longer works. The patient received the machine when she was discharged from the hospital 05/24/2020.   This CM attempted to contact [name] Martinez/[facility] # [phone] x [phone] to inquire if [employer] can check the machine to see if it an be repaired. Message left with call back requested to this CM

## 2021-08-10 ENCOUNTER — Telehealth: Payer: Self-pay | Admitting: *Deleted

## 2021-08-10 NOTE — Telephone Encounter (Signed)
Copied from CRM [id number]. Topic: Appointment Scheduling - Scheduling Inquiry for Clinic >> Aug 10, 2021  8:27 AM Veronica Castaneda E wrote: Reason for CRM: Pts needs appt to renew her orange card / card expired in October / please advise

## 2021-08-10 NOTE — Telephone Encounter (Signed)
This CM spoke to Nicholes Mango, [employer] [facility] and explained that patient has reported that her BiPAP does not work. [name] said that the machine should be covered under a warranty. She said she will call the patient and is aware that a Spanish Interpreter is needed. She said she may also try contacting patient's daughter, [name] to see if she can provide any additional information about the issues with the machine.

## 2021-08-10 NOTE — Telephone Encounter (Signed)
Return Pt call, schedule a financial appt

## 2021-08-21 ENCOUNTER — Other Ambulatory Visit: Payer: Self-pay

## 2021-08-21 ENCOUNTER — Ambulatory Visit: Payer: Self-pay | Attending: Internal Medicine

## 2021-08-22 NOTE — Telephone Encounter (Signed)
Call placed to [facility] to inquire if patient's issues with her BiPAP have been addressed.  [name] stated that she will need to reach back out to [place], respiratory tech in [place] office to check the status if this request.

## 2021-09-21 ENCOUNTER — Telehealth: Payer: Self-pay

## 2021-09-21 NOTE — Telephone Encounter (Signed)
Call placed to [facility]/ [facility] to inquire if they were able to contact the patient and address her concerns about the BiPAP machine. [name] said that they tried a number of times to contact the patient and have not had any success reaching her. She said that one time the person who answered hung up. They had a Spanish speaking staff member call the patient and still were not able to reach her to inquire how they can help her.  Per [name], after patient first received the machine, they did a lot of re-education about the use of the machine and the importance of compliance.    [name] said at this point it would probably be best for the patient to take her machine to the [place] office and have someone look at it.  It would be most helpful to have someone who speaks English accompany her who can re-enforce what is discussed when they are at home.  The patient, or representative, should call the [facility] office # [phone] and inform them that she would like to come to their office with her machine to have it checked.  She may be eligible for a new machine if this one is not working because it is under warranty. . The [facility] office is located at [address] [place], [place].    [name] said she would send this information about the patient and her needs to attention of Lashay, Respiratory Tech/ [place].    Genella Rife - can you please contact patient and/ or family member about this? I think she really needs to have this BiPAP checked in person. We also need to clarify if there is a family member or other contact, that speaks [place].   Thanks Genella Rife!

## 2021-09-21 NOTE — Telephone Encounter (Signed)
Call placed to Angela Martinez/ [facility] to inquire if they were able to contact the patient and address her concerns about the BiPAP machine. Angela said that they tried a number of times to contact the patient and have not had any success reaching her. She said that one time the person who answered hung up. They had a Spanish speaking staff member call the patient and still were not able to reach her to inquire how they can help her.  °Per Angela, after patient first received the machine, they did a lot of re-education about the use of the machine and the importance of compliance.  °  °Angela said at this point it would probably be best for the patient to take her machine to the [place] office and have someone look at it.  It would be most helpful to have someone who speaks English accompany her who can re-enforce what is discussed when they are at home.  °The patient, or representative, should call the [facility] [place] office # [phone] and inform them that she would like to come to their office with her machine to have it checked.  She may be eligible for a new machine if this one is not working because it is under warranty. . The [facility] office is located at [address], [place].  °  °Angela said she would send this information about the patient and her needs to attention of Lashay, Respiratory Tech/ [place].  °  °Silvia - can you please contact patient and/ or family member about this? I think she really needs to have this BiPAP checked in person. °We also need to clarify if there is a family member or other contact, that speaks English. °  °Thanks Silvia!  °

## 2021-09-22 NOTE — Telephone Encounter (Signed)
Called pt stated has nobody else to speak english, below informations given , stated will make call today to [facility]. Call back nr and nae provided if needed to call this nurse back.

## 2021-10-03 ENCOUNTER — Telehealth: Payer: Self-pay | Admitting: *Deleted

## 2021-10-03 NOTE — Telephone Encounter (Signed)
Copied from [place] [id number]. Topic: Appointment Scheduling - Scheduling Inquiry for Clinic >> Sep 29, 2021  3:43 PM Yvette Rack wrote: Reason for CRM: Pt requests call back to schedule appt to renew orange card. Cb# [phone]

## 2021-10-04 NOTE — Telephone Encounter (Signed)
I return Pt call, LVM to call me back 

## 2021-10-16 ENCOUNTER — Telehealth: Payer: Self-pay

## 2021-10-16 NOTE — Telephone Encounter (Signed)
This CM spoke to Veronica Castaneda, [facility] regarding patient's questions about her BiPAP machine.  [name] said that they have reached out to the patient multiple times with a Spanish Interpreter and the patient does not seem to understand that they need to examine her machine to make sure that there is nothing they can do to repair it before providing her with a new machine. There may also be something that the patient is not understanding about using the machine. They also cannot simply provide a new machine if this one is under warranty.  [name] said at this point, the patient should just bring the machine to their retail store at [address] [place], [place]. She does not need an appointment.    [name] - can you please call her again and explain this?  Thanks

## 2021-10-31 NOTE — Telephone Encounter (Signed)
Call placed to patient with assistance of Spanish Interpreter # [id number]/[employer] and inquired if she has taken her BiPAP to [facility] to have it checked and she said she has not.  Explained to her that [facility] needs to examine her machine to make sure that there is nothing they can do to repair it before providing her with a new machine.They are not able to just provide her with a new machine. Instructed her to take the machine to [employer] store at   [address] [place], [place]. She does not need an appointment.  the address was spelled out for her multiple times. She said she understood the importance of taking the machine to [facility] to be checked and she did not have any other questions.  Confirmed her next appointment with Dr Laural Benes  - 11/28/2021 @ 1610.

## 2021-11-13 NOTE — Telephone Encounter (Addendum)
Call received from Lashay/[employer]- [facility]# [phone] stating that the patient came to their [place] office last week and they were not able to determine what is wrong with her BiPAP machine. Ardean Larsen said that despite the use of their interpreter service, language was a barrier.   She also stated that the patient is uninsured and they would not replace her machine.  I explained to her that the patient was given the machine 05/24/2020 when she was discharged from the hospital and it is still under warranty.   Ardean Larsen said she will need to have their manager review the records  and they will get back to me with a resolution to this mater.

## 2021-11-28 ENCOUNTER — Encounter: Payer: Self-pay | Admitting: Internal Medicine

## 2021-11-28 ENCOUNTER — Other Ambulatory Visit: Payer: Self-pay

## 2021-11-28 ENCOUNTER — Ambulatory Visit: Payer: Self-pay | Attending: Internal Medicine | Admitting: Internal Medicine

## 2021-11-28 VITALS — BP 119/72 | HR 70 | Wt 251.0 lb

## 2021-11-28 DIAGNOSIS — R7303 Prediabetes: Secondary | ICD-10-CM

## 2021-11-28 DIAGNOSIS — R109 Unspecified abdominal pain: Secondary | ICD-10-CM

## 2021-11-28 DIAGNOSIS — E782 Mixed hyperlipidemia: Secondary | ICD-10-CM

## 2021-11-28 DIAGNOSIS — E662 Morbid (severe) obesity with alveolar hypoventilation: Secondary | ICD-10-CM

## 2021-11-28 DIAGNOSIS — Z1211 Encounter for screening for malignant neoplasm of colon: Secondary | ICD-10-CM

## 2021-11-28 DIAGNOSIS — G8929 Other chronic pain: Secondary | ICD-10-CM

## 2021-11-28 MED ORDER — METHOCARBAMOL 500 MG PO TABS
500.0000 mg | ORAL_TABLET | Freq: Every day | ORAL | 0 refills | Status: DC | PRN
  Filled 2021-11-28: qty 15, 15d supply, fill #0

## 2021-11-28 MED ORDER — DICLOFENAC SODIUM 1 % EX GEL
2.0000 g | Freq: Four times a day (QID) | CUTANEOUS | 1 refills | Status: AC
  Filled 2021-11-28: qty 100, 12d supply, fill #0
  Filled 2021-12-04: qty 100, 12d supply, fill #1

## 2021-11-28 NOTE — Progress Notes (Signed)
Patient ID: Veronica Castaneda, female    DOB: Feb 08, 1961  MRN: [id number]  CC: Insomnia (Having pain in right side , numbness on the right side , discuss referral for  cpap )   Subjective: Veronica Castaneda is a 61 y.o. female who presents for chronic ds management Her concerns today include:  Pt with preDM/morbid obesity, OA LT knee, mixed HL, persistent asthma, hypoventilation syndrome with likely OSA on BiPAP.   OSA/OHS: On last visit with me in November, patient reported that her BiPAP had quit working.   Case worker has been in contact with [place].  Patient went to the [place] office in late January.  They were unable to determine what was wrong with the device and they are unable to replace the machine.  According to CW note dated 10/16/2021, the machine is still under warranty and the person to home she spoke stated that they will speak with him manager and get back to [place] with the resolution to the matter.  Patient complains of pain on the right mid side of her back.  She points to the flank area.  Denies any dysuria.  She feels it constantly but worse when she is up moving around.  It has been present for 4 months.  She is not aware of any initiating factors.  Last CAT scan of the abdomen was done 1 year ago.  Kidneys appear okay except for a 1.1 cm simple cyst in the left kidney.  PreDM/Obesity:  eating smaller portions.  She is not very active.  Reports compliance with taking metformin.  Also reports compliance with and tolerating atorvastatin for cholesterol.   Patient Active Problem List   Diagnosis Date Noted   Ventral hernia without obstruction or gangrene    Umbilical hernia without obstruction and without gangrene    Hyperventilation syndrome 11/24/2020   Asthma exacerbation 05/22/2020   Morbid obesity with body mass index (BMI) of 50.0 to 59.9 in adult ([place]) 05/22/2020   Obesity hypoventilation syndrome ([place]) 05/22/2020   OSA  (obstructive sleep apnea) 05/22/2020   RSV (respiratory syncytial virus infection) 05/22/2020   Difficult airway for intubation 05/22/2020   Acute respiratory failure with hypoxia ([place]) 05/12/2020   Loud snoring 02/11/2020   Daytime sleepiness 02/11/2020   Screening breast examination 10/22/2019   Prediabetes 08/18/2019   Hyperlipidemia 08/18/2019   Morbid obesity ([place]) 08/17/2019     Current Outpatient Medications on File Prior to Visit  Medication Sig Dispense Refill   acetaminophen (TYLENOL) 500 MG tablet Take 2 tablets (1,000 mg total) by mouth every 6 (six) hours as needed for mild pain.     atorvastatin (LIPITOR) 10 MG tablet TAKE 1 TABLET (10 MG TOTAL) BY MOUTH DAILY. 30 tablet 6   ibuprofen (ADVIL) 800 MG tablet Take 1 tablet (800 mg total) by mouth every 8 (eight) hours as needed for moderate pain. 60 tablet 1   metFORMIN (GLUCOPHAGE) 500 MG tablet TAKE 1/2 TABLET (250 MG TOTAL) BY MOUTH DAILY WITH BREAKFAST. 30 tablet 6   Multiple Vitamin (MULTIVITAMIN WITH MINERALS) TABS tablet Take 1 tablet by mouth daily. 30 tablet 0   albuterol (PROVENTIL) (2.5 MG/3ML) 0.083% nebulizer solution TAKE 3 MLS (2.5 MG TOTAL) BY NEBULIZATION EVERY 6 (SIX) HOURS AS NEEDED FOR WHEEZING OR SHORTNESS OF BREATH. 75 mL 6   albuterol (VENTOLIN HFA) 108 (90 Base) MCG/ACT inhaler INHALE 2 PUFFS INTO THE LUNGS EVERY 4 (FOUR) HOURS AS NEEDED FOR WHEEZING OR SHORTNESS OF BREATH. 18 g  6   furosemide (LASIX) 20 MG tablet TAKE 1 TABLET (20 MG TOTAL) BY MOUTH DAILY AS NEEDED FOR LEG SELLING. DO NOT TAKE MORE THAN 1 TABLET PER DAY 30 tablet 6   polyethylene glycol (MIRALAX / GLYCOLAX) 17 g packet Take 17 g by mouth daily. (Patient not taking: Reported on 11/28/2021) 14 each 0   No current facility-administered medications on file prior to visit.    No Known Allergies  Social History   Socioeconomic History   Marital status: Single    Spouse name: Not on file   Number of children: 3   Years of education: Not  on file   Highest education level: 2nd grade  Occupational History   Occupation: house wife  Tobacco Use   Smoking status: Never   Smokeless tobacco: Never  Vaping Use   Vaping Use: Never used  Substance and Sexual Activity   Alcohol use: Never   Drug use: Never   Sexual activity: Not Currently  Other Topics Concern   Not on file  Social History Narrative   Not on file   Social Determinants of Health   Financial Resource Strain: Not on file  Food Insecurity: Not on file  Transportation Needs: Unmet Transportation Needs   Lack of Transportation (Medical): Yes   Lack of Transportation (Non-Medical): Yes  Physical Activity: Not on file  Stress: Not on file  Social Connections: Not on file  Intimate Partner Violence: Not on file    Family History  Problem Relation Age of Onset   Heart attack Mother    Heart disease Mother     Past Surgical History:  Procedure Laterality Date   INSERTION OF MESH  06/15/2021   Procedure: INSERTION OF MESH;  Surgeon: Olean Ree, MD;  Location: [facility] ORS;  Service: General;;   NO PAST SURGERIES     No PAST SURGICAL HISTORY     XI ROBOTIC ASSISTED VENTRAL HERNIA N/A 06/15/2021   Procedure: XI ROBOTIC ASSISTED VENTRAL HERNIA;  Surgeon: Olean Ree, MD;  Location: [facility] ORS;  Service: General;  Laterality: N/A;    ROS: Review of Systems Negative except as stated above  PHYSICAL EXAM: BP 119/72    Pulse 70    Wt 251 lb (113.9 kg)    SpO2 95%    BMI 62.82 kg/m   Wt Readings from Last 3 Encounters:  11/28/21 251 lb (113.9 kg)  08/04/21 244 lb 12.8 oz (111 kg)  06/30/21 244 lb (110.7 kg)    Physical Exam  General appearance - alert, well appearing, morbidly obese Hispanic female and in no distress Mental status - normal mood, behavior, speech, dress, motor activity, and thought processes Neck - supple, no significant adenopathy Chest - clear to auscultation, no wheezes, rales or rhonchi, symmetric air entry Heart - normal rate,  regular rhythm, normal S1, S2, no murmurs, rubs, clicks or gallops Musculoskeletal -no tenderness on palpation of the thoracic spine or surrounding paraspinal muscles.  Power in the lower extremities 5/5 bilaterally. Extremities -no lower extremity edema.  CMP Latest Ref Rng & Units 11/24/2020 05/27/2020 05/21/2020  Glucose 65 - 99 mg/dL 81 107(H) 103(H)  BUN 6 - 24 mg/dL 11 9 31(H)  Creatinine 0.57 - 1.00 mg/dL 0.59 0.49(L) 0.60  Sodium 134 - 144 mmol/L 137 140 145  Potassium 3.5 - 5.2 mmol/L 4.5 4.2 3.5  Chloride 96 - 106 mmol/L 97 102 110  CO2 20 - 29 mmol/L 26 24 25   Calcium 8.7 - 10.2 mg/dL 9.0 9.1  9.1  Total Protein 6.0 - 8.5 g/dL 7.2 - -  Total Bilirubin 0.0 - 1.2 mg/dL 0.3 - -  Alkaline Phos 44 - 121 IU/L 126(H) - -  AST 0 - 40 IU/L 13 - -  ALT 0 - 32 IU/L 10 - -   Lipid Panel     Component Value Date/Time   CHOL 276 (H) 11/24/2020 1102   TRIG 298 (H) 11/24/2020 1102   HDL 41 11/24/2020 1102   CHOLHDL 6.7 (H) 11/24/2020 1102   LDLCALC 177 (H) 11/24/2020 1102    CBC    Component Value Date/Time   WBC 6.7 11/24/2020 1102   WBC 7.4 05/17/2020 0429   RBC 4.66 11/24/2020 1102   RBC 4.59 05/17/2020 0429   HGB 14.8 11/24/2020 1102   HCT 44.8 11/24/2020 1102   PLT 221 11/24/2020 1102   MCV 96 11/24/2020 1102   MCH 31.8 11/24/2020 1102   MCH 27.9 05/17/2020 0429   MCHC 33.0 11/24/2020 1102   MCHC 29.6 (L) 05/17/2020 0429   RDW 12.1 11/24/2020 1102   LYMPHSABS 1.0 05/12/2020 0654   MONOABS 1.0 05/12/2020 0654   EOSABS 0.1 05/12/2020 0654   BASOSABS 0.0 05/12/2020 0654    ASSESSMENT AND PLAN: 1. Obesity hypoventilation syndrome [facility]) It looks like our caseworker has not heard back from the medical supply company.  I have sent her a message to follow-up on this. - CBC; Future - Comprehensive metabolic panel; Future  2. Prediabetes 3. Morbid obesity ([place]) Discussed and encourage healthy eating habits.  Patient declines referral to nutritionist.  4. Mixed  hyperlipidemia Continue atorvastatin - Lipid panel; Future  5. Flank pain, chronic Likely musculoskeletal given that it is worse with movement.  We will try her with Voltaren gel and a muscle relaxant.  Patient advised to follow-up in several weeks if no improvement. - diclofenac Sodium (VOLTAREN) 1 % GEL; Apply 2 g topically 4 (four) times daily.  Dispense: 100 g; Refill: 1 - methocarbamol (ROBAXIN) 500 MG tablet; Take 1 tablet (500 mg total) by mouth daily as needed for muscle spasms.  Dispense: 15 tablet; Refill: 0  6. Screening for colon cancer Patient agreeable to colon cancer screening. - Fecal occult blood, imunochemical([facility]); Future   [employer] Language interpreter used during this encounter. #Juan [id number]  Patient was given the opportunity to ask questions.  Patient verbalized understanding of the plan and was able to repeat key elements of the plan.   This documentation was completed using Radio producer.  Any transcriptional errors are unintentional.  No orders of the defined types were placed in this encounter.    Requested Prescriptions    No prescriptions requested or ordered in this encounter    No follow-ups on file.  Karle Plumber, MD, FACP

## 2021-11-29 ENCOUNTER — Other Ambulatory Visit: Payer: Self-pay | Admitting: Internal Medicine

## 2021-11-29 ENCOUNTER — Other Ambulatory Visit: Payer: Self-pay

## 2021-11-30 ENCOUNTER — Telehealth: Payer: Self-pay

## 2021-11-30 NOTE — Telephone Encounter (Signed)
Opened in error

## 2021-11-30 NOTE — Telephone Encounter (Signed)
Call place to [facility]# [phone], spoke to [facility] regarding the status of obtaining a new machine for the patient.  She could not find any updates/ resolution in the patient's medical record and said she would need to check with her manager and call this CM back.  ?

## 2021-12-01 ENCOUNTER — Other Ambulatory Visit: Payer: Self-pay

## 2021-12-04 ENCOUNTER — Other Ambulatory Visit: Payer: Self-pay

## 2021-12-04 ENCOUNTER — Other Ambulatory Visit: Payer: Self-pay | Admitting: Internal Medicine

## 2021-12-04 DIAGNOSIS — R600 Localized edema: Secondary | ICD-10-CM

## 2021-12-05 NOTE — Telephone Encounter (Signed)
Requested medication (s) are due for refill today: yes ? ?Requested medication (s) are on the active medication list: yes ? ?Last refill:  10/14/20 #30/6 ? ?Future visit scheduled: yes ? ?Notes to clinic:  Unable to refill per protocol due to failed labs, no updated results. ? ? ? ?  ?Requested Prescriptions  ?Pending Prescriptions Disp Refills  ? furosemide (LASIX) 20 MG tablet 30 tablet 6  ?  Sig: TAKE 1 TABLET (20 MG TOTAL) BY MOUTH DAILY AS NEEDED FOR LEG SELLING. DO NOT TAKE MORE THAN 1 TABLET PER DAY  ?  ? Cardiovascular:  Diuretics - Loop Failed - 12/04/2021  3:14 PM  ?  ?  Failed - K in normal range and within 180 days  ?  Potassium  ?Date Value Ref Range Status  ?11/24/2020 4.5 3.5 - 5.2 mmol/L Final  ?  ?  ?  ?  Failed - Ca in normal range and within 180 days  ?  Calcium  ?Date Value Ref Range Status  ?11/24/2020 9.0 8.7 - 10.2 mg/dL Final  ? ?Calcium, Ion  ?Date Value Ref Range Status  ?05/20/2020 1.24 1.15 - 1.40 mmol/L Final  ?  ?  ?  ?  Failed - Na in normal range and within 180 days  ?  Sodium  ?Date Value Ref Range Status  ?11/24/2020 137 134 - 144 mmol/L Final  ?  ?  ?  ?  Failed - Cr in normal range and within 180 days  ?  Creatinine, Ser  ?Date Value Ref Range Status  ?11/24/2020 0.59 0.57 - 1.00 mg/dL Final  ?  ?  ?  ?  Failed - Cl in normal range and within 180 days  ?  Chloride  ?Date Value Ref Range Status  ?11/24/2020 97 96 - 106 mmol/L Final  ?  ?  ?  ?  Failed - Mg Level in normal range and within 180 days  ?  Magnesium  ?Date Value Ref Range Status  ?05/21/2020 2.3 1.7 - 2.4 mg/dL Final  ?  Comment:  ?  Performed at [facility], [address] [place], [place] [address]  ?  ?  ?  ?  Passed - Last BP in normal range  ?  BP Readings from Last 1 Encounters:  ?11/28/21 119/72  ?  ?  ?  ?  Passed - Valid encounter within last 6 months  ?  Recent Outpatient Visits   ? ?      ? 1 week ago Obesity hypoventilation syndrome (HCC)  ? [facility] Marcine Matar,  MD  ? 4 months ago Morbid obesity [facility])  ? [facility] Marcine Matar, MD  ? 6 months ago Preoperative evaluation to rule out surgical contraindication  ? [facility] Marcine Matar, MD  ? 8 months ago Mixed hyperlipidemia  ? [facility] Marcine Matar, MD  ? 1 year ago Asthma, persistent controlled  ? [facility] Marcine Matar, MD  ? ?  ?  ?Future Appointments   ? ?        ? In 3 months Marcine Matar, MD [facility]  ? ?  ? ?  ?  ?  ? ?

## 2021-12-06 MED ORDER — FUROSEMIDE 20 MG PO TABS
ORAL_TABLET | ORAL | 6 refills | Status: DC
  Filled 2021-12-06: qty 30, 30d supply, fill #0

## 2021-12-07 ENCOUNTER — Other Ambulatory Visit: Payer: Self-pay

## 2021-12-14 ENCOUNTER — Other Ambulatory Visit: Payer: Self-pay

## 2022-02-14 ENCOUNTER — Telehealth: Payer: Self-pay

## 2022-02-14 NOTE — Telephone Encounter (Signed)
I called [facility] # [phone] again to inquire if the patient would be eligible for a replacement BiPAP and spoke to [place].  She said that she would need to have the manager call me back.  My call back # [phone].  Kathie Rhodes said that the patient's ID in their system is: [id number]

## 2022-02-15 NOTE — Telephone Encounter (Signed)
I called Veronica Castaneda/ [employer] [facility] back and informed her that I have not been able to reach the patient to confirm an appointment for tomorrow - 5/26 but will continue to try to reach her to confirm an appointment for 02/21/2022.

## 2022-02-15 NOTE — Telephone Encounter (Addendum)
Call received from [place]/[facility] who said that she spoke to her manager and they have a BiPAP that they will switch out for the patient. The available times for the switch out would be tomorrow- 5/26 @ 1500 or 5/31 @ 1000.  The patient would need to bring her current machine for the appointment which will be at the [facility] office -  [phone] [address]. Kathie Rhodes said that if the patient does not have an interpreter with her, they will need to use Google translate.   I called patient with attempt to notify her of above information and schedule an appointment at [facility].  Calls placed to [phone] and [phone] with assistance of Spanish Interpreter [id number]/[employer], messages were left at both numbers with call back requested to myself or [place].   Kathie Rhodes can be reached at [phone] or [phone] to confirm an appointment for the patient

## 2022-03-20 ENCOUNTER — Telehealth: Payer: Self-pay | Admitting: Internal Medicine

## 2022-03-20 DIAGNOSIS — Z1231 Encounter for screening mammogram for malignant neoplasm of breast: Secondary | ICD-10-CM

## 2022-03-20 NOTE — Telephone Encounter (Signed)
Copied from CRM [id number]. Topic: Appointment Scheduling - Scheduling Inquiry for Clinic >> Mar 19, 2022 12:22 PM Franchot Heidelberg wrote: Reason for CRM: Pt wants to be scheduled for mammogram

## 2022-03-21 ENCOUNTER — Other Ambulatory Visit: Payer: Self-pay

## 2022-03-21 ENCOUNTER — Ambulatory Visit: Payer: Self-pay | Attending: Physician Assistant | Admitting: Physician Assistant

## 2022-03-21 ENCOUNTER — Encounter: Payer: Self-pay | Admitting: Physician Assistant

## 2022-03-21 ENCOUNTER — Other Ambulatory Visit: Payer: Self-pay | Admitting: Obstetrics and Gynecology

## 2022-03-21 VITALS — BP 108/70 | HR 66 | Wt 248.4 lb

## 2022-03-21 DIAGNOSIS — G629 Polyneuropathy, unspecified: Secondary | ICD-10-CM

## 2022-03-21 DIAGNOSIS — Z1231 Encounter for screening mammogram for malignant neoplasm of breast: Secondary | ICD-10-CM

## 2022-03-21 DIAGNOSIS — E782 Mixed hyperlipidemia: Secondary | ICD-10-CM

## 2022-03-21 DIAGNOSIS — R600 Localized edema: Secondary | ICD-10-CM

## 2022-03-21 DIAGNOSIS — R7303 Prediabetes: Secondary | ICD-10-CM

## 2022-03-21 LAB — POCT GLYCOSYLATED HEMOGLOBIN (HGB A1C): HbA1c, POC (controlled diabetic range): 5.8 % (ref 0.0–7.0)

## 2022-03-21 LAB — GLUCOSE, POCT (MANUAL RESULT ENTRY): POC Glucose: 108 mg/dl — AB (ref 70–99)

## 2022-03-21 MED ORDER — FUROSEMIDE 20 MG PO TABS
ORAL_TABLET | ORAL | 6 refills | Status: DC
  Filled 2022-03-21: qty 30, 30d supply, fill #0

## 2022-03-21 MED ORDER — GABAPENTIN 300 MG PO CAPS
300.0000 mg | ORAL_CAPSULE | Freq: Three times a day (TID) | ORAL | 3 refills | Status: DC
  Filled 2022-03-21: qty 90, 30d supply, fill #0

## 2022-03-21 MED ORDER — ATORVASTATIN CALCIUM 10 MG PO TABS
ORAL_TABLET | Freq: Every day | ORAL | 6 refills | Status: DC
  Filled 2022-03-21: qty 30, 30d supply, fill #0

## 2022-03-21 MED ORDER — ALBUTEROL SULFATE HFA 108 (90 BASE) MCG/ACT IN AERS
2.0000 | INHALATION_SPRAY | RESPIRATORY_TRACT | 1 refills | Status: DC | PRN
  Filled 2022-03-21: qty 18, 25d supply, fill #0

## 2022-03-21 MED ORDER — METFORMIN HCL 500 MG PO TABS
ORAL_TABLET | ORAL | 6 refills | Status: DC
  Filled 2022-03-21: qty 45, 90d supply, fill #0

## 2022-03-21 NOTE — Progress Notes (Signed)
Patient ID: Veronica Castaneda, female   DOB: 04-07-1961, 61 y.o.   MRN: [id number]

## 2022-03-21 NOTE — Telephone Encounter (Signed)
Pt will be given number at her office visit today at 4:10

## 2022-03-23 NOTE — Telephone Encounter (Signed)
Reason for CRM: [facility] [employer] has called to speak with Erskine Squibb B when possible regarding a BiPAP for the patient

## 2022-03-29 NOTE — Telephone Encounter (Signed)
Call returned to [facility]/ [facility] # [phone], Docia Barrier was not available but I spoke to Veronica Castaneda. She explained that the patient still has not picked up the BiPAP machine that they have been holding for her. Veronica Castaneda said she is not sure how long they will be able to continue holding it. I told her that I will call the patient again today and I requested they hold the machine for another week and Veronica Castaneda said she could not guarantee how long it will be held for the patient. I confirmed with Veronica Castaneda that the above noted phone number is the number the patient should use to contact [facility]   I called the patient # [phone] with assistance of Spanish Interpreter # [id number]/[employer] in attempt to encourage her to contact [employer] [facility] about the BiPAP machine.  Message left with call back requested to this CM.   I then called patient's daughter, Veronica Castaneda # [phone] with the assistance of the interpreter and explained the need to speak to her mother today.  She said her mother is home and should be answering the phone. I confirmed the patient's phone number and explained that a message was left requesting a return call. I then asked Veronica Castaneda to please contact her mother today and ask her mother to call me this afternoon.  Call back number provided.  Veronica Castaneda said she would contact her.

## 2022-03-30 ENCOUNTER — Ambulatory Visit: Payer: Self-pay | Attending: Internal Medicine | Admitting: Internal Medicine

## 2022-03-30 VITALS — BP 122/67 | HR 56 | Temp 98.7°F | Ht <= 58 in | Wt 249.0 lb

## 2022-03-30 DIAGNOSIS — E662 Morbid (severe) obesity with alveolar hypoventilation: Secondary | ICD-10-CM

## 2022-03-30 NOTE — Progress Notes (Signed)
Needs new CPAP machine

## 2022-03-30 NOTE — Progress Notes (Signed)
Patient ID: Veronica Castaneda, female    DOB: [birth year]  MRN: [id number]  CC: chronic ds management Subjective: Veronica Castaneda is a 61 y.o. female who presents for chronic ds management Her concerns today include:  Pt with preDM/morbid obesity, OA LT knee, mixed HL, persistent asthma, hypoventilation syndrome with likely OSA on BiPAP.   Patient presents today complaining of increased sleepiness since her BiPAP machine quit working several months ago.  She reported this on last visit with me.  Our case worker has been working with [employer] [facility] to try to get her another BiPAP machine since the 1 that she has was still under warranty.  Caseworker tried to reach her 03/20/2022 after speaking with [facility].  They have the machine for her and was waiting for her to come pick it up.  Case worker was unable to reach her but did speak with her daughter Veronica Castaneda who was supposed to give patient the message to call us back.  This has not happened.     Patient Active Problem List   Diagnosis Date Noted   Ventral hernia without obstruction or gangrene    Umbilical hernia without obstruction and without gangrene    Hyperventilation syndrome 11/24/2020   Asthma exacerbation 05/22/2020   Morbid obesity with body mass index (BMI) of 50.0 to 59.9 in adult (HCC) 05/22/2020   Obesity hypoventilation syndrome (HCC) 05/22/2020   OSA (obstructive sleep apnea) 05/22/2020   RSV (respiratory syncytial virus infection) 05/22/2020   Difficult airway for intubation 05/22/2020   Acute respiratory failure with hypoxia (HCC) 05/12/2020   Loud snoring 02/11/2020   Daytime sleepiness 02/11/2020   Screening breast examination 10/22/2019   Prediabetes 08/18/2019   Hyperlipidemia 08/18/2019   Morbid obesity (HCC) 08/17/2019     Current Outpatient Medications on File Prior to Visit  Medication Sig Dispense Refill   acetaminophen (TYLENOL) 500 MG tablet Take 2 tablets (1,000 mg total) by  mouth every 6 (six) hours as needed for mild pain.     albuterol (VENTOLIN HFA) 108 (90 Base) MCG/ACT inhaler INHALE 2 PUFFS INTO THE LUNGS EVERY 4 (FOUR) HOURS AS NEEDED FOR WHEEZING OR SHORTNESS OF BREATH. 18 g 1   atorvastatin (LIPITOR) 10 MG tablet TAKE 1 TABLET (10 MG TOTAL) BY MOUTH DAILY. 30 tablet 6   diclofenac Sodium (VOLTAREN) 1 % GEL Apply 2 g topically 4 (four) times daily. 100 g 1   furosemide (LASIX) 20 MG tablet TAKE 1 TABLET (20 MG TOTAL) BY MOUTH DAILY AS NEEDED FOR LEG SELLING. DO NOT TAKE MORE THAN 1 TABLET PER DAY 30 tablet 6   gabapentin (NEURONTIN) 300 MG capsule Take 1 capsule (300 mg total) by mouth 3 (three) times daily. 90 capsule 3   metFORMIN (GLUCOPHAGE) 500 MG tablet TAKE 1/2 TABLET (250 MG TOTAL) BY MOUTH DAILY WITH BREAKFAST. 30 tablet 6   methocarbamol (ROBAXIN) 500 MG tablet Take 1 tablet (500 mg total) by mouth daily as needed for muscle spasms. 15 tablet 0   Multiple Vitamin (MULTIVITAMIN WITH MINERALS) TABS tablet Take 1 tablet by mouth daily. 30 tablet 0   polyethylene glycol (MIRALAX / GLYCOLAX) 17 g packet Take 17 g by mouth daily. 14 each 0   albuterol (PROVENTIL) (2.5 MG/3ML) 0.083% nebulizer solution TAKE 3 MLS (2.5 MG TOTAL) BY NEBULIZATION EVERY 6 (SIX) HOURS AS NEEDED FOR WHEEZING OR SHORTNESS OF BREATH. 75 mL 6   No current facility-administered medications on file prior to visit.    No Known Allergies  Social History   Socioeconomic History   Marital status: Single    Spouse name: Not on file   Number of children: 3   Years of education: Not on file   Highest education level: 2nd grade  Occupational History   Occupation: house wife  Tobacco Use   Smoking status: Never   Smokeless tobacco: Never  Vaping Use   Vaping Use: Never used  Substance and Sexual Activity   Alcohol use: Never   Drug use: Never   Sexual activity: Not Currently  Other Topics Concern   Not on file  Social History Narrative   Not on file   Social Determinants  of Health   Financial Resource Strain: Not on file  Food Insecurity: Not on file  Transportation Needs: Unmet Transportation Needs (12/08/2020)   PRAPARE - Administrator, Civil Service (Medical): Yes    Lack of Transportation (Non-Medical): Yes  Physical Activity: Not on file  Stress: Not on file  Social Connections: Not on file  Intimate Partner Violence: Not on file    Family History  Problem Relation Age of Onset   Heart attack Mother    Heart disease Mother     Past Surgical History:  Procedure Laterality Date   INSERTION OF MESH  06/15/2021   Procedure: INSERTION OF MESH;  Surgeon: Henrene Dodge, MD;  Location: [facility] ORS;  Service: General;;   NO PAST SURGERIES     No PAST SURGICAL HISTORY     XI ROBOTIC ASSISTED VENTRAL HERNIA N/A 06/15/2021   Procedure: XI ROBOTIC ASSISTED VENTRAL HERNIA;  Surgeon: Henrene Dodge, MD;  Location: [facility] ORS;  Service: General;  Laterality: N/A;    ROS: Review of Systems Negative except as stated above  PHYSICAL EXAM: BP 122/67   Pulse (!) 56   Temp 98.7 F (37.1 C) (Oral)   Ht 4\' 10"  (1.473 m)   Wt 249 lb (112.9 kg)   SpO2 90%   BMI 52.04 kg/m   Wt Readings from Last 3 Encounters:  03/30/22 249 lb (112.9 kg)  03/21/22 248 lb 6.4 oz (112.7 kg)  11/28/21 251 lb (113.9 kg)    Physical Exam  General appearance -patient appears sleepy.  She is morbidly obese. Mental status -sleepy.  However able to stay awake to engage in conversation with me. Chest - clear to auscultation, no wheezes, rales or rhonchi, symmetric air entry Heart - normal rate, regular rhythm, normal S1, S2, no murmurs, rubs, clicks or gallops      Latest Ref Rng & Units 11/24/2020   11:02 AM 05/27/2020    9:25 AM 05/21/2020    3:45 AM  CMP  Glucose 65 - 99 mg/dL 81  05/23/2020  063   BUN 6 - 24 mg/dL 11  9  31    Creatinine 0.57 - 1.00 mg/dL 016   0.10   Sodium 134 - 144 mmol/L 137  140  145   Potassium 3.5 - 5.2 mmol/L 4.5  4.2  3.5   Chloride 96 -  106 mmol/L 97  102  110   CO2 20 - 29 mmol/L 26  24  25    Calcium 8.7 - 10.2 mg/dL 9.0  9.1  9.1   Total Protein 6.0 - 8.5 g/dL 7.2     Total Bilirubin 0.0 - 1.2 mg/dL 0.3     Alkaline Phos 44 - 121 IU/L 126     AST 0 - 40 IU/L 13     ALT 0 -  32 IU/L 10      Lipid Panel     Component Value Date/Time   CHOL 276 (H) 11/24/2020 1102   TRIG 298 (H) 11/24/2020 1102   HDL 41 11/24/2020 1102   CHOLHDL 6.7 (H) 11/24/2020 1102   LDLCALC 177 (H) 11/24/2020 1102    CBC    Component Value Date/Time   WBC 6.7 11/24/2020 1102   WBC 7.4 05/17/2020 0429   RBC 4.66 11/24/2020 1102   RBC 4.59 05/17/2020 0429   HGB 14.8 11/24/2020 1102   HCT 44.8 11/24/2020 1102   PLT 221 11/24/2020 1102   MCV 96 11/24/2020 1102   MCH 31.8 11/24/2020 1102   MCH 27.9 05/17/2020 0429   MCHC 33.0 11/24/2020 1102   MCHC 29.6 (L) 05/17/2020 0429   RDW 12.1 11/24/2020 1102   LYMPHSABS 1.0 05/12/2020 0654   MONOABS 1.0 05/12/2020 0654   EOSABS 0.1 05/12/2020 0654   BASOSABS 0.0 05/12/2020 0654    ASSESSMENT AND PLAN: 1. Obesity hypoventilation syndrome [facility]) Patient given the phone number to call [facility] and the address so that she can go by there today to pick up the BiPAP machine.  I also had my CMA call [employer] [facility] to let them know.  Encourage patient to get this today and use it every night consistently.  I will have the case worker follow-up with her on Monday to make sure that she obtain the machine.  She expressed understanding.   [employer] Language interpreter used during this encounter. [id number] Frederickson  Patient was given the opportunity to ask questions.  Patient verbalized understanding of the plan and was able to repeat key elements of the plan.   This documentation was completed using Radio producer.  Any transcriptional errors are unintentional.  No orders of the defined types were placed in this encounter.    Requested Prescriptions    No prescriptions  requested or ordered in this encounter    Return in about 7 weeks (around 05/18/2022).  Karle Plumber, MD, FACP

## 2022-04-02 NOTE — Telephone Encounter (Signed)
I called [employer] [facility] and spoke to Amy.  She said that the patient has not picked up the machine yet.  They are trying to get in touch with her to come to pick up the machine. The technician can meet with the patient and an interpreter on 04/05/2022 @ 1430 but they have not been able to reach the patient to confirm the appointment. I confirmed the patient's phone numbers, one of which is the number for her daughter, [name].   Amy said they will continue to try to reach the patient and I left a message for the patient this afternoon.

## 2022-04-02 NOTE — Telephone Encounter (Signed)
Call placed to patient with assistance of Spanish Interpreter [id number] to confirm that the patient picked up her BiPAP machine.  Message left with call back requested to this CM

## 2022-04-03 ENCOUNTER — Telehealth: Payer: Self-pay

## 2022-04-03 NOTE — Telephone Encounter (Signed)
Call placed to patient with assistance of Spanish Interpreter [id number]/[employer] to confirm that the patient spoke to a representative from [employer] regarding picking up her BiPAP machine. Message left with call back requested to this CM.

## 2022-04-04 ENCOUNTER — Telehealth: Payer: Self-pay

## 2022-04-04 NOTE — Telephone Encounter (Signed)
Call received from [facility] who explained that they have not been able to reach the patient to instruct to come to pick up the BiPAP. They have left messages with no call back and will try again tomorrow with an interpreter.  Veronica Castaneda understands that the patient needs some instruction regarding the use of the machine when she comes to pick it up.

## 2022-04-19 ENCOUNTER — Emergency Department (HOSPITAL_COMMUNITY): Payer: Self-pay

## 2022-04-19 ENCOUNTER — Inpatient Hospital Stay (HOSPITAL_COMMUNITY)
Admission: EM | Admit: 2022-04-19 | Discharge: 2022-04-23 | DRG: 193 | Disposition: A | Discharge status: Home / Self Care | Payer: Self-pay | Attending: Internal Medicine | Admitting: Internal Medicine

## 2022-04-19 ENCOUNTER — Other Ambulatory Visit: Payer: Self-pay

## 2022-04-19 ENCOUNTER — Ambulatory Visit: Payer: Self-pay

## 2022-04-19 ENCOUNTER — Encounter (HOSPITAL_COMMUNITY): Payer: Self-pay

## 2022-04-19 DIAGNOSIS — J45909 Unspecified asthma, uncomplicated: Secondary | ICD-10-CM | POA: Diagnosis present

## 2022-04-19 DIAGNOSIS — Z8249 Family history of ischemic heart disease and other diseases of the circulatory system: Secondary | ICD-10-CM

## 2022-04-19 DIAGNOSIS — E785 Hyperlipidemia, unspecified: Secondary | ICD-10-CM | POA: Diagnosis present

## 2022-04-19 DIAGNOSIS — R7401 Elevation of levels of liver transaminase levels: Secondary | ICD-10-CM | POA: Diagnosis present

## 2022-04-19 DIAGNOSIS — E662 Morbid (severe) obesity with alveolar hypoventilation: Secondary | ICD-10-CM | POA: Diagnosis present

## 2022-04-19 DIAGNOSIS — Z79899 Other long term (current) drug therapy: Secondary | ICD-10-CM

## 2022-04-19 DIAGNOSIS — M1712 Unilateral primary osteoarthritis, left knee: Secondary | ICD-10-CM | POA: Diagnosis present

## 2022-04-19 DIAGNOSIS — R7989 Other specified abnormal findings of blood chemistry: Secondary | ICD-10-CM

## 2022-04-19 DIAGNOSIS — J189 Pneumonia, unspecified organism: Principal | ICD-10-CM

## 2022-04-19 DIAGNOSIS — Z20822 Contact with and (suspected) exposure to covid-19: Secondary | ICD-10-CM | POA: Diagnosis present

## 2022-04-19 DIAGNOSIS — Z6841 Body Mass Index (BMI) 40.0 and over, adult: Secondary | ICD-10-CM

## 2022-04-19 DIAGNOSIS — J9601 Acute respiratory failure with hypoxia: Secondary | ICD-10-CM | POA: Diagnosis present

## 2022-04-19 DIAGNOSIS — E119 Type 2 diabetes mellitus without complications: Secondary | ICD-10-CM | POA: Diagnosis present

## 2022-04-19 DIAGNOSIS — I5033 Acute on chronic diastolic (congestive) heart failure: Secondary | ICD-10-CM | POA: Diagnosis present

## 2022-04-19 DIAGNOSIS — Z7984 Long term (current) use of oral hypoglycemic drugs: Secondary | ICD-10-CM

## 2022-04-19 LAB — CBC WITH DIFFERENTIAL/PLATELET
Abs Immature Granulocytes: 0.11 10*3/uL — ABNORMAL HIGH (ref 0.00–0.07)
Basophils Absolute: 0.1 10*3/uL (ref 0.0–0.1)
Basophils Relative: 1 %
Eosinophils Absolute: 0.3 10*3/uL (ref 0.0–0.5)
Eosinophils Relative: 2 %
HCT: 43.6 % (ref 36.0–46.0)
Hemoglobin: 13.7 g/dL (ref 12.0–15.0)
Immature Granulocytes: 1 %
Lymphocytes Relative: 20 %
Lymphs Abs: 2.2 10*3/uL (ref 0.7–4.0)
MCH: 32 pg (ref 26.0–34.0)
MCHC: 31.4 g/dL (ref 30.0–36.0)
MCV: 101.9 fL — ABNORMAL HIGH (ref 80.0–100.0)
Monocytes Absolute: 1.2 10*3/uL — ABNORMAL HIGH (ref 0.1–1.0)
Monocytes Relative: 11 %
Neutro Abs: 7.1 10*3/uL (ref 1.7–7.7)
Neutrophils Relative %: 65 %
Platelets: 201 10*3/uL (ref 150–400)
RBC: 4.28 MIL/uL (ref 3.87–5.11)
RDW: 13.1 % (ref 11.5–15.5)
WBC: 10.9 10*3/uL — ABNORMAL HIGH (ref 4.0–10.5)
nRBC: 0 % (ref 0.0–0.2)

## 2022-04-19 LAB — COMPREHENSIVE METABOLIC PANEL
ALT: 58 U/L — ABNORMAL HIGH (ref 0–44)
AST: 49 U/L — ABNORMAL HIGH (ref 15–41)
Albumin: 2.8 g/dL — ABNORMAL LOW (ref 3.5–5.0)
Alkaline Phosphatase: 126 U/L (ref 38–126)
Anion gap: 6 (ref 5–15)
BUN: 6 mg/dL (ref 6–20)
CO2: 30 mmol/L (ref 22–32)
Calcium: 8.7 mg/dL — ABNORMAL LOW (ref 8.9–10.3)
Chloride: 104 mmol/L (ref 98–111)
Creatinine, Ser: 0.57 mg/dL (ref 0.44–1.00)
GFR, Estimated: >60 mL/min (ref >60)
Glucose, Bld: 108 mg/dL — ABNORMAL HIGH (ref 70–99)
Potassium: 4.4 mmol/L (ref 3.5–5.1)
Sodium: 140 mmol/L (ref 135–145)
Total Bilirubin: 0.4 mg/dL (ref 0.3–1.2)
Total Protein: 7.1 g/dL (ref 6.5–8.1)

## 2022-04-19 LAB — SARS CORONAVIRUS 2 BY RT PCR: SARS Coronavirus 2 by RT PCR: NEGATIVE

## 2022-04-19 MED ORDER — SODIUM CHLORIDE 0.9 % IV SOLN
500.0000 mg | Freq: Once | INTRAVENOUS | Status: AC
  Administered 2022-04-20: 500 mg via INTRAVENOUS
  Filled 2022-04-19: qty 5

## 2022-04-19 MED ORDER — SODIUM CHLORIDE 0.9 % IV SOLN
1.0000 g | Freq: Once | INTRAVENOUS | Status: AC
  Administered 2022-04-20: 1 g via INTRAVENOUS
  Filled 2022-04-19: qty 10

## 2022-04-19 MED ORDER — IOHEXOL 350 MG/ML SOLN
61.0000 mL | Freq: Once | INTRAVENOUS | Status: AC | PRN
  Administered 2022-04-19: 61 mL via INTRAVENOUS

## 2022-04-19 MED ORDER — FUROSEMIDE 10 MG/ML IJ SOLN
20.0000 mg | Freq: Once | INTRAMUSCULAR | Status: AC
  Administered 2022-04-19: 20 mg via INTRAVENOUS
  Filled 2022-04-19: qty 2

## 2022-04-19 NOTE — ED Triage Notes (Signed)
Pt arrived POV from home c/o bloody sputum, a cough and hypoxia x3 days.

## 2022-04-19 NOTE — ED Provider Notes (Signed)
[facility] EMERGENCY DEPARTMENT Provider Note   CSN: [id number] Arrival date & time: 04/19/22  1344     History  Chief Complaint  Patient presents with   Shortness of Breath    Veronica Castaneda is a 61 y.o. female.   Shortness of Breath  Patient is a 61 year old female with a history of sleep apnea, morbid obesity, DM 2, HLD who presents to the emergency department for evaluation of shortness of breath.  Patient states that 3 days ago she had onset of shortness of breath.  She has also had an intermittent mild cough which has since began to produce blood-tinged sputum.  She denies any recent long travel, hormone use, or any history of DVT/PE.  She denies any current chest pain.  She denies any abdominal pain, nausea vomiting diarrhea.  She has been taking over-the-counter Tylenol and other cold and flu medications.  Her symptoms began worsening today which ultimately prompted her to come to the emergency department for evaluation.  Video Spanish interpreter was used for the duration of the patient encounter.     Home Medications Prior to Admission medications   Medication Sig Start Date End Date Taking? Authorizing Provider  acetaminophen (TYLENOL) 500 MG tablet Take 2 tablets (1,000 mg total) by mouth every 6 (six) hours as needed for mild pain. 06/15/21   Piscoya, Elita Quick, MD  albuterol (PROVENTIL) (2.5 MG/3ML) 0.083% nebulizer solution TAKE 3 MLS (2.5 MG TOTAL) BY NEBULIZATION EVERY 6 (SIX) HOURS AS NEEDED FOR WHEEZING OR SHORTNESS OF BREATH. 11/24/20 11/24/21  Marcine Matar, MD  albuterol (VENTOLIN HFA) 108 (90 Base) MCG/ACT inhaler INHALE 2 PUFFS INTO THE LUNGS EVERY 4 (FOUR) HOURS AS NEEDED FOR WHEEZING OR SHORTNESS OF BREATH. 03/21/22 03/21/23  Anders Simmonds, PA-C  atorvastatin (LIPITOR) 10 MG tablet TAKE 1 TABLET (10 MG TOTAL) BY MOUTH DAILY. 03/21/22 03/21/23  Anders Simmonds, PA-C  diclofenac Sodium (VOLTAREN) 1 % GEL Apply 2 g topically 4 (four)  times daily. 11/28/21   Marcine Matar, MD  furosemide (LASIX) 20 MG tablet TAKE 1 TABLET (20 MG TOTAL) BY MOUTH DAILY AS NEEDED FOR LEG SELLING. DO NOT TAKE MORE THAN 1 TABLET PER DAY 03/21/22 03/21/23  Anders Simmonds, PA-C  gabapentin (NEURONTIN) 300 MG capsule Take 1 capsule (300 mg total) by mouth 3 (three) times daily. 03/21/22   Anders Simmonds, PA-C  metFORMIN (GLUCOPHAGE) 500 MG tablet TAKE 1/2 TABLET (250 MG TOTAL) BY MOUTH DAILY WITH BREAKFAST. 03/21/22 03/21/23  Anders Simmonds, PA-C  methocarbamol (ROBAXIN) 500 MG tablet Take 1 tablet (500 mg total) by mouth daily as needed for muscle spasms. 11/28/21   Marcine Matar, MD  Multiple Vitamin (MULTIVITAMIN WITH MINERALS) TABS tablet Take 1 tablet by mouth daily. 05/24/20   Swayze, Ava, DO  polyethylene glycol (MIRALAX / GLYCOLAX) 17 g packet Take 17 g by mouth daily. 05/24/20   Swayze, Ava, DO      Allergies    Patient has no known allergies.    Review of Systems   Review of Systems  Respiratory:  Positive for shortness of breath.     Physical Exam Updated Vital Signs BP (!) 155/78   Pulse (!) 111   Temp 98.5 F (36.9 C) (Oral)   Resp 20   SpO2 99%  Physical Exam Vitals and nursing note reviewed.  Constitutional:      General: She is not in acute distress.    Appearance: She is well-developed. She is  obese. She is ill-appearing.  HENT:     Head: Normocephalic and atraumatic.     Mouth/Throat:     Mouth: Mucous membranes are moist.     Pharynx: Oropharynx is clear. No pharyngeal swelling or oropharyngeal exudate.  Eyes:     Extraocular Movements: Extraocular movements intact.     Conjunctiva/sclera: Conjunctivae normal.     Pupils: Pupils are equal, round, and reactive to light.  Neck:     Vascular: No JVD.  Cardiovascular:     Rate and Rhythm: Normal rate and regular rhythm.     Pulses: Normal pulses.     Heart sounds: Normal heart sounds. No murmur heard. Pulmonary:     Effort: Pulmonary effort is normal.  No respiratory distress.     Breath sounds: Normal breath sounds. No decreased breath sounds, wheezing, rhonchi or rales.  Abdominal:     Palpations: Abdomen is soft.     Tenderness: There is no abdominal tenderness.  Musculoskeletal:        General: No swelling.     Cervical back: Neck supple.     Right lower leg: No tenderness. No edema.     Left lower leg: No tenderness. No edema.  Lymphadenopathy:     Cervical: No cervical adenopathy.  Skin:    General: Skin is warm and dry.     Capillary Refill: Capillary refill takes less than 2 seconds.  Neurological:     General: No focal deficit present.     Mental Status: She is alert and oriented to person, place, and time.  Psychiatric:        Mood and Affect: Mood normal.     ED Results / Procedures / Treatments   Labs (all labs ordered are listed, but only abnormal results are displayed) Labs Reviewed  CBC WITH DIFFERENTIAL/PLATELET - Abnormal; Notable for the following components:      Result Value   WBC 10.9 (*)    MCV 101.9 (*)    Monocytes Absolute 1.2 (*)    Abs Immature Granulocytes 0.11 (*)    All other components within normal limits  COMPREHENSIVE METABOLIC PANEL - Abnormal; Notable for the following components:   Glucose, Bld 108 (*)    Calcium 8.7 (*)    Albumin 2.8 (*)    AST 49 (*)    ALT 58 (*)    All other components within normal limits  SARS CORONAVIRUS 2 BY RT PCR  BRAIN NATRIURETIC PEPTIDE    EKG None  Radiology CT Angio Chest PE W and/or Wo Contrast  Result Date: 04/19/2022 CLINICAL DATA:  Shortness of breath, productive cough EXAM: CT ANGIOGRAPHY CHEST WITH CONTRAST TECHNIQUE: Multidetector CT imaging of the chest was performed using the standard protocol during bolus administration of intravenous contrast. Multiplanar CT image reconstructions and MIPs were obtained to evaluate the vascular anatomy. RADIATION DOSE REDUCTION: This exam was performed according to the departmental dose-optimization  program which includes automated exposure control, adjustment of the mA and/or kV according to patient size and/or use of iterative reconstruction technique. CONTRAST:  86mL OMNIPAQUE IOHEXOL 350 MG/ML SOLN COMPARISON:  Chest radiograph dated 04/19/2022 FINDINGS: Cardiovascular: Satisfactory opacification of the bilateral pulmonary arteries to the segmental level. No evidence of pulmonary embolism. Although not tailored for evaluation of the thoracic aorta, there is no evidence of thoracic aortic aneurysm or dissection. The heart is top-normal in size.  No pericardial effusion. Mediastinum/Nodes: No suspicious mediastinal lymphadenopathy. Visualized thyroid is unremarkable. Lungs/Pleura: Multifocal patchy/nodular opacities in the left  upper lobe, right middle lobe, and bilateral lower lobes, suspicious for multifocal pneumonia, left upper lobe predominant. Mild centrilobular emphysematous changes, upper lung predominant. Mild right lower lobe atelectasis. No pleural effusion or pneumothorax. Upper Abdomen: Visualized upper abdomen is grossly unremarkable. Musculoskeletal: Degenerative changes of the visualized thoracolumbar spine. Review of the MIP images confirms the above findings. IMPRESSION: No evidence of pulmonary embolism. Multifocal pneumonia, left upper lobe predominant. Emphysema (ICD10-J43.9). Electronically Signed   By: Charline Bills M.D.   On: 04/19/2022 23:51   DG Chest 2 View  Result Date: 04/19/2022 CLINICAL DATA:  Productive cough. Shortness of breath. Personal history of congestive heart failure. EXAM: CHEST - 2 VIEW COMPARISON:  One-view chest x-ray 05/21/2020 FINDINGS: The heart is enlarged. Diffuse interstitial pattern is present. Small effusions are suspected. Asymmetric left lower lobe airspace opacity noted. No pneumothorax is present. Multilevel degenerative changes are present in the thoracic spine. IMPRESSION: 1. Cardiomegaly with interstitial edema and small bilateral effusions  compatible with congestive heart failure. 2. Asymmetric left lower lobe airspace disease likely reflects atelectasis. Infection is not excluded. Electronically Signed   By: Marin Roberts M.D.   On: 04/19/2022 14:43    Procedures Procedures    Medications Ordered in ED Medications  cefTRIAXone (ROCEPHIN) 1 g in sodium chloride 0.9 % 100 mL IVPB (has no administration in time range)  azithromycin (ZITHROMAX) 500 mg in sodium chloride 0.9 % 250 mL IVPB (has no administration in time range)  furosemide (LASIX) injection 20 mg (20 mg Intravenous Given 04/19/22 2349)  iohexol (OMNIPAQUE) 350 MG/ML injection 61 mL (61 mLs Intravenous Contrast Given 04/19/22 2341)    ED Course/ Medical Decision Making/ A&P                           Medical Decision Making Problems Addressed: Acute on chronic diastolic (congestive) heart failure (HCC): complicated acute illness or injury with systemic symptoms that poses a threat to life or bodily functions Multifocal pneumonia: complicated acute illness or injury with systemic symptoms that poses a threat to life or bodily functions  Amount and/or Complexity of Data Reviewed Independent Historian: spouse and EMS External Data Reviewed: labs, radiology, ECG and notes. Labs: ordered. Radiology: ordered. ECG/medicine tests: ordered.  Risk Prescription drug management. Decision regarding hospitalization.   Patient is a 61 year old female who presents emergency department as above.  On initial presentation patient's blood pressure is 107/49.  She is afebrile and her pulse ox is 91%.  Physical exam revealed an uncomfortable appearing female.  No abnormal lung sounds or new murmurs identified.  Suspicion at this time in the setting of the patient's having some blood-tinged sputum and low oxygen at home was for PE versus pneumonia versus ACS.  Based on laboratory work and imaging studies ordered for this patient.  CMP showed slightly low albumin and slightly  elevated AST and ALT at 49 and 58 respectively but was otherwise unremarkable.  CBC with evidence of leukocytosis of 10.9 but no evidence of anemia.  COVID flu swab negative.  Chest x-ray showed cardiomegaly with interstitial edema and small bilateral effusions compatible with congestive heart failure as well as asymmetric left lower airspace disease.  However in the setting of the patient's ongoing shortness of breath a CT angio of the chest was ordered.  This did not show any evidence of PE but did show multifocal pneumonia in the left upper lobe predominantly.  Also emphysematous changes.  For this reason patient was  started on IV antibiotics including ceftriaxone and azithromycin.  She did have 1 low oxygen reading at 81% and for that reason was started on 3 L nasal cannula.  She is not on oxygen at home.  In the setting of the new oxygen requirement as well as multifocal pneumonia she was felt appropriate for admission to the hospital for further management.  Inpatient hospitalist team was contacted for admission.  Plan and findings discussed with patient at bedside she verbalized understanding was agreeable.  Please see inpatient provider notes for additional details regarding this patient's ongoing hospital course and management.        Final Clinical Impression(s) / ED Diagnoses Final diagnoses:  Multifocal pneumonia  Acute on chronic diastolic (congestive) heart failure [facility])    Rx / DC Orders ED Discharge Orders     None         Tommie Raymond, MD 04/20/22 0018    Maia Plan, MD 04/22/22 [id number]

## 2022-04-19 NOTE — Telephone Encounter (Signed)
  Chief Complaint: Cough - possibly with blood, Mild sob Symptoms: Cough, congestion Frequency: 2 days Pertinent Negatives: Patient denies Severe SOB Disposition: [] ED /[x] Urgent Care (no appt availability in office) / [] Appointment(In office/virtual)/ []  [facility]/ [] Home Care/ [] Refused Recommended Disposition /[] [facility]/ []  Follow-up with PCP Additional Notes: Pt has had congestion for 2 days. Pt has mild SOB and feels that her oxygen levels are low. Pt is not producing a lot of phlegm, but what she does bring up is red. Pt has been taking NyQuil and Tylenol, with little relief.    Patient called wanting an appointment with Dr. but she did not have anything for today or tomorrow. Other providers didn't also. Patient has a really bad cold and stated that her nose is stuffed and feel that's this is causing her some trouble breathing. I suggested for her to speak with triage nurse but refuse therefore I still choose to send a message for a nurse to call her back. Patient only speaks spanish. Reason for Disposition  [1] Coughed up blood AND [2] > 1 tablespoon (15 ml)   (Exception: Blood-tinged sputum.)  Answer Assessment - Initial Assessment Questions 1. ONSET: "When did the cough begin?"      2 days 2. SEVERITY: "How bad is the cough today?"      Severe - nearly constant 3. SPUTUM: "Describe the color of your sputum" (none, dry cough; clear, white, yellow, green)     red 4. HEMOPTYSIS: "Are you coughing up any blood?" If so ask: "How much?" (flecks, streaks, tablespoons, etc.)     All red 5. DIFFICULTY BREATHING: "Are you having difficulty breathing?" If Yes, ask: "How bad is it?" (e.g., mild, moderate, severe)    - MILD: No SOB at rest, mild SOB with walking, speaks normally in sentences, can lie down, no retractions, pulse < 100.    - MODERATE: SOB at rest, SOB with minimal exertion and prefers to sit, cannot lie down flat, speaks in phrases, mild  retractions, audible wheezing, pulse 100-120.    - SEVERE: Very SOB at rest, speaks in single words, struggling to breathe, sitting hunched forward, retractions, pulse > 120      mild 6. FEVER: "Do you have a fever?" If Yes, ask: "What is your temperature, how was it measured, and when did it start?"     no 7. CARDIAC HISTORY: "Do you have any history of heart disease?" (e.g., heart attack, congestive heart failure)       8. LUNG HISTORY: "Do you have any history of lung disease?"  (e.g., pulmonary embolus, asthma, emphysema)      9. PE RISK FACTORS: "Do you have a history of blood clots?" (or: recent major surgery, recent prolonged travel, bedridden)      10. OTHER SYMPTOMS: "Do you have any other symptoms?" (e.g., runny nose, wheezing, chest pain)       Clogged sinus - SOB 11. PREGNANCY: "Is there any chance you are pregnant?" "When was your last menstrual period?"       na 12. TRAVEL: "Have you traveled out of the country in the last month?" (e.g., travel history, exposures)  Protocols used: Cough - Acute Non-Productive-A-AH

## 2022-04-19 NOTE — Telephone Encounter (Signed)
FYI  Patient instructed to go to urgent care.

## 2022-04-19 NOTE — ED Provider Triage Note (Signed)
Emergency Medicine Provider Triage Evaluation Note  Katelynd Blauvelt , a 61 y.o. female  was evaluated in triage.  Pt complains of congestion, cough, difficulty breathing for the past 3 days.  Similar symptoms with a visit 2 years ago per patient.  Has taken some ibuprofen in staff for symptomatic treatment of her congestion without any improvement.  Review of Systems  Positive: Chills, fever,Cough, congestion, headache Negative: Abdominal pain, nausea, vomiting  Physical Exam  BP (!) 107/49 (BP Location: Left Arm)   Pulse 89   Temp 99.1 F (37.3 C) (Oral)   Resp 16   SpO2 91%  Gen:   Awake, no distress   Resp:  Normal effort  MSK:   Moves extremities without difficulty  Other:    Medical Decision Making  Medically screening exam initiated at 2:06 PM.  Appropriate orders placed.  Yannet Rincon [name] was informed that the remainder of the evaluation will be completed by another provider, this initial triage assessment does not replace that evaluation, and the importance of remaining in the ED until their evaluation is complete.    Claude Manges, PA-C 04/19/22 1409

## 2022-04-20 DIAGNOSIS — E662 Morbid (severe) obesity with alveolar hypoventilation: Secondary | ICD-10-CM

## 2022-04-20 DIAGNOSIS — J9601 Acute respiratory failure with hypoxia: Secondary | ICD-10-CM

## 2022-04-20 DIAGNOSIS — J189 Pneumonia, unspecified organism: Secondary | ICD-10-CM

## 2022-04-20 DIAGNOSIS — R7989 Other specified abnormal findings of blood chemistry: Secondary | ICD-10-CM

## 2022-04-20 DIAGNOSIS — Z6841 Body Mass Index (BMI) 40.0 and over, adult: Secondary | ICD-10-CM

## 2022-04-20 LAB — CBC
HCT: 42.6 % (ref 36.0–46.0)
Hemoglobin: 13.2 g/dL (ref 12.0–15.0)
MCH: 31.7 pg (ref 26.0–34.0)
MCHC: 31 g/dL (ref 30.0–36.0)
MCV: 102.4 fL — ABNORMAL HIGH (ref 80.0–100.0)
Platelets: 196 10*3/uL (ref 150–400)
RBC: 4.16 MIL/uL (ref 3.87–5.11)
RDW: 13.1 % (ref 11.5–15.5)
WBC: 8.3 10*3/uL (ref 4.0–10.5)
nRBC: 0.2 % (ref 0.0–0.2)

## 2022-04-20 LAB — GLUCOSE, CAPILLARY
Glucose-Capillary: 84 mg/dL (ref 70–99)
Glucose-Capillary: 140 mg/dL — ABNORMAL HIGH (ref 70–99)
Glucose-Capillary: 111 mg/dL — ABNORMAL HIGH (ref 70–99)

## 2022-04-20 LAB — COMPREHENSIVE METABOLIC PANEL
ALT: 47 U/L — ABNORMAL HIGH (ref 0–44)
AST: 29 U/L (ref 15–41)
Albumin: 2.6 g/dL — ABNORMAL LOW (ref 3.5–5.0)
Alkaline Phosphatase: 113 U/L (ref 38–126)
Anion gap: 6 (ref 5–15)
BUN: <5 mg/dL — ABNORMAL LOW (ref 6–20)
CO2: 33 mmol/L — ABNORMAL HIGH (ref 22–32)
Calcium: 8.5 mg/dL — ABNORMAL LOW (ref 8.9–10.3)
Chloride: 102 mmol/L (ref 98–111)
Creatinine, Ser: 0.5 mg/dL (ref 0.44–1.00)
GFR, Estimated: >60 mL/min (ref >60)
Glucose, Bld: 120 mg/dL — ABNORMAL HIGH (ref 70–99)
Potassium: 3.9 mmol/L (ref 3.5–5.1)
Sodium: 141 mmol/L (ref 135–145)
Total Bilirubin: 0.9 mg/dL (ref 0.3–1.2)
Total Protein: 7 g/dL (ref 6.5–8.1)

## 2022-04-20 LAB — MRSA NEXT GEN BY PCR, NASAL: MRSA by PCR Next Gen: NOT DETECTED

## 2022-04-20 LAB — BRAIN NATRIURETIC PEPTIDE: B Natriuretic Peptide: 48.9 pg/mL (ref 0.0–100.0)

## 2022-04-20 MED ORDER — ATORVASTATIN CALCIUM 10 MG PO TABS
10.0000 mg | ORAL_TABLET | Freq: Every day | ORAL | Status: DC
  Administered 2022-04-20 – 2022-04-23 (×4): 10 mg via ORAL
  Filled 2022-04-20 (×4): qty 1

## 2022-04-20 MED ORDER — GUAIFENESIN ER 600 MG PO TB12
600.0000 mg | ORAL_TABLET | Freq: Two times a day (BID) | ORAL | Status: DC
  Administered 2022-04-20 – 2022-04-23 (×7): 600 mg via ORAL
  Filled 2022-04-20 (×7): qty 1

## 2022-04-20 MED ORDER — ALBUTEROL SULFATE (2.5 MG/3ML) 0.083% IN NEBU: 2.5000 mg | INHALATION_SOLUTION | RESPIRATORY_TRACT | Status: DC | PRN

## 2022-04-20 MED ORDER — ENOXAPARIN SODIUM 40 MG/0.4ML IJ SOSY
40.0000 mg | PREFILLED_SYRINGE | INTRAMUSCULAR | Status: DC
  Administered 2022-04-20 – 2022-04-23 (×4): 40 mg via SUBCUTANEOUS
  Filled 2022-04-20 (×4): qty 0.4

## 2022-04-20 MED ORDER — ONDANSETRON HCL 4 MG/2ML IJ SOLN: 4.0000 mg | Freq: Four times a day (QID) | INTRAMUSCULAR | Status: DC | PRN

## 2022-04-20 MED ORDER — INSULIN ASPART 100 UNIT/ML IJ SOLN
0.0000 [IU] | Freq: Three times a day (TID) | INTRAMUSCULAR | Status: DC
  Administered 2022-04-20 – 2022-04-21 (×2): 1 [IU] via SUBCUTANEOUS
  Administered 2022-04-22: 2 [IU] via SUBCUTANEOUS
  Administered 2022-04-22: 1 [IU] via SUBCUTANEOUS

## 2022-04-20 MED ORDER — SODIUM CHLORIDE 0.9 % IV SOLN: 1.0000 g | INTRAVENOUS | Status: DC

## 2022-04-20 MED ORDER — ORAL CARE MOUTH RINSE: 15.0000 mL | OROMUCOSAL | Status: DC | PRN

## 2022-04-20 MED ORDER — AZITHROMYCIN 500 MG IV SOLR
500.0000 mg | INTRAVENOUS | Status: DC
  Administered 2022-04-21 – 2022-04-22 (×2): 500 mg via INTRAVENOUS
  Filled 2022-04-20 (×2): qty 5

## 2022-04-20 MED ORDER — BUDESONIDE 0.25 MG/2ML IN SUSP
0.2500 mg | Freq: Two times a day (BID) | RESPIRATORY_TRACT | Status: DC
  Administered 2022-04-20 – 2022-04-23 (×7): 0.25 mg via RESPIRATORY_TRACT
  Filled 2022-04-20 (×7): qty 2

## 2022-04-20 MED ORDER — SODIUM CHLORIDE 0.9 % IV SOLN
2.0000 g | INTRAVENOUS | Status: DC
  Administered 2022-04-20 – 2022-04-22 (×3): 2 g via INTRAVENOUS
  Filled 2022-04-20 (×3): qty 20

## 2022-04-20 MED ORDER — ACETAMINOPHEN 325 MG PO TABS: 650.0000 mg | ORAL_TABLET | Freq: Four times a day (QID) | ORAL | Status: DC | PRN

## 2022-04-20 MED ORDER — ARFORMOTEROL TARTRATE 15 MCG/2ML IN NEBU
15.0000 ug | INHALATION_SOLUTION | Freq: Two times a day (BID) | RESPIRATORY_TRACT | Status: DC
  Administered 2022-04-20 – 2022-04-23 (×7): 15 ug via RESPIRATORY_TRACT
  Filled 2022-04-20 (×7): qty 2

## 2022-04-20 NOTE — Progress Notes (Signed)
Mobility Specialist: Progress Note   04/20/22 1545  Mobility  Activity Ambulated independently in hallway  Level of Assistance Independent  Assistive Device None  Distance Ambulated (ft) 260 ft  Activity Response Tolerated well  $Mobility charge 1 Mobility   During Mobility on RA: 83% SpO2 Post-Mobility on 2 L/min [place]: 72 HR, 108/55 (69) BP, 92% SpO2  Received pt sitting EOB having no complaints and agreeable to mobility. Pt was asymptomatic throughout ambulation and returned to room w/o fault. Left sitting EOB w/ call bell in reach and all needs met.  [facility] [place] Mobility Specialist Mobility Specialist [address]: [phone]

## 2022-04-20 NOTE — TOC Progression Note (Signed)
Transition of Care [facility]) - Progression Note    Patient Details  Name: Charlotta Lapaglia MRN: [id number] Date of Birth: [birth year]  [facility]) CM/SW Contact  Beckie Busing, RN Phone Number:[phone]  04/20/2022, 2:28 PM  Clinical Narrative:     Transition of Care [facility]) Screening Note   Patient Details  Name: Khadijatou Borak Date of Birth: [birth year]   Transition of Care [facility]) CM/SW Contact:    Beckie Busing, RN Phone Number: 04/20/2022, 2:30 PM    Transition of Care Department [facility]) has reviewed patient and no TOC needs have been identified at this time. We will continue to monitor patient advancement through interdisciplinary progression rounds.           Expected Discharge Plan and Services                                                 Social Determinants of Health (SDOH) Interventions    Readmission Risk Interventions     No data to display

## 2022-04-20 NOTE — Progress Notes (Signed)
Called to patients room on BIPAP through the Dream station with 8 lpm bled in oxygen.  Patient desating etc.  Pt placed on v60 BIPAP in AVAPS mode for more pressure and volume control.  Tolerating well at this time.  RT will continue to monitor and adjust BIPAP accordingly.

## 2022-04-20 NOTE — Progress Notes (Signed)
PROGRESS NOTE        PATIENT DETAILS Name: Veronica Castaneda Age: 61 y.o. Sex: female Date of Birth: 1961/07/21 Admit Date: 04/19/2022 Admitting Physician Anselm Jungling, DO [id number], Binnie Rail, MD  Brief Summary: Patient is a 61 y.o.  female OSA on CPAP/BiPAP, chronic persistent asthma, HLD, OA left knee, morbid obesity-who presented with 2-3-day history of subjective fever, productive cough and shortness of breath.  She was found to have acute hypoxic respiratory failure due to community-acquired pneumonia and subsequently admitted to the hospitalist service.  See below for further details.  Significant events: 7/27>> admit to [facility]-hypoxia-needing BiPAP in the ED-PNA on CT imaging.  Significant studies: 7/27>> CT angio: No PE, multifocal pneumonia-predominantly in the left upper lobe.    Significant microbiology data: 7/27>> COVID PCR: Negative  Procedures: None  Consults: None  Subjective: Spoke with patient/spouse-with iPad translator at bedside-feels better than yesterday.  Stable on 4 L of oxygen.  Liberated off BiPAP early this morning.  Objective: Vitals: Blood pressure 108/60, pulse 80, temperature 97.6 F (36.4 C), temperature source Oral, resp. rate 15, height 5' 0.63" (1.54 m), weight 111.2 kg, SpO2 92 %.   Exam: Gen Exam:Alert awake-not in any distress HEENT:atraumatic, normocephalic Chest: B/L clear to auscultation anteriorly CVS:S1S2 regular Abdomen:soft non tender, non distended Extremities:no edema Neurology: Non focal Skin: no rash  Pertinent Labs/Radiology:    Latest Ref Rng & Units 04/20/2022    5:53 AM 04/19/2022    2:23 PM 11/24/2020   11:02 AM  CBC  WBC 4.0 - 10.5 K/uL 8.3  10.9  6.7   Hemoglobin 12.0 - 15.0 g/dL 95.0  93.2  67.1   Hematocrit 36.0 - 46.0 % 42.6  43.6  44.8   Platelets 150 - 400 K/uL 196  201  221     Lab Results  Component Value Date   NA 141 04/20/2022   K 3.9 04/20/2022   CL 102  04/20/2022   CO2 33 (H) 04/20/2022      Assessment/Plan: Acute hypoxic respiratory failure due to multifocal PNA: Clinically improved-on around 4 L of oxygen this morning-continue Rocephin/Zithromax.  No evidence of CHF (ruled out).  Plan is to continue supportive care and follow clinically.  Continue to attempt to titrate down FiO2.  Minimally elevated transaminases: Likely of no clinical significance-follow periodically.  DM-2 (last A1c 5.7 on 11/24/2020): CBG stable-hold metformin-start SSI.  Chronic persistent asthma: Continue bronchodilators-not in flare.  OSA: On BiPAP at home.  Morbid obesity Estimated body mass index is 46.89 kg/m as calculated from the following:   Height as of this encounter: 5' 0.63" (1.54 m).   Weight as of this encounter: 111.2 kg.   Code status:   Code Status: Full Code   DVT Prophylaxis: enoxaparin (LOVENOX) injection 40 mg Start: 04/20/22 1000   Family Communication: Spouse at bedside   Disposition Plan: Status is: Inpatient Remains inpatient appropriate because: Hypoxia requiring O2-due to PNA-on IV antibiotics.  Needs another 24-48 hours of hospitalization prior to discharge.  Planned Discharge Destination:Home   Diet: Diet Order             Diet Heart Room service appropriate? Yes; Fluid consistency: Thin  Diet effective now                     Antimicrobial agents: Anti-infectives (From admission, onward)  Start     Dose/Rate Route Frequency Ordered Stop   04/21/22 0000  azithromycin (ZITHROMAX) 500 mg in sodium chloride 0.9 % 250 mL IVPB        500 mg 250 mL/hr over 60 Minutes Intravenous Every 24 hours 04/20/22 0120     04/20/22 2200  cefTRIAXone (ROCEPHIN) 2 g in sodium chloride 0.9 % 100 mL IVPB        2 g 200 mL/hr over 30 Minutes Intravenous Every 24 hours 04/20/22 0658     04/20/22 0000  cefTRIAXone (ROCEPHIN) 1 g in sodium chloride 0.9 % 100 mL IVPB        1 g 200 mL/hr over 30 Minutes Intravenous  Once  04/19/22 2359 04/20/22 0111   04/20/22 0000  azithromycin (ZITHROMAX) 500 mg in sodium chloride 0.9 % 250 mL IVPB        500 mg 250 mL/hr over 60 Minutes Intravenous  Once 04/19/22 2359 04/20/22 0217   04/20/22 0000  cefTRIAXone (ROCEPHIN) 1 g in sodium chloride 0.9 % 100 mL IVPB  Status:  Discontinued        1 g 200 mL/hr over 30 Minutes Intravenous Every 24 hours 04/20/22 0120 04/20/22 0658        MEDICATIONS: Scheduled Meds:  enoxaparin (LOVENOX) injection  40 mg Subcutaneous Q24H   Continuous Infusions:  [START ON 04/21/2022] azithromycin     cefTRIAXone (ROCEPHIN)  IV     PRN Meds:.acetaminophen, albuterol, ondansetron (ZOFRAN) IV, mouth rinse   I have personally reviewed following labs and imaging studies  LABORATORY DATA: CBC: Recent Labs  Lab 04/19/22 1423 04/20/22 0553  WBC 10.9* 8.3  NEUTROABS 7.1  --   HGB 13.7 13.2  HCT 43.6 42.6  MCV 101.9* 102.4*  PLT 201 [id number]    Basic Metabolic Panel: Recent Labs  Lab 04/19/22 1423 04/20/22 0553  NA 140 141  K 4.4 3.9  CL 104 102  CO2 30 33*  GLUCOSE 108* 120*  BUN 6 <5*  CREATININE 0.57 0.50  CALCIUM 8.7* 8.5*    GFR: Estimated Creatinine Clearance: 87.9 mL/min (by C-G formula based on SCr of 0.5 mg/dL).  Liver Function Tests: Recent Labs  Lab 04/19/22 1423 04/20/22 0553  AST 49* 29  ALT 58* 47*  ALKPHOS 126 113  BILITOT 0.4 0.9  PROT 7.1 7.0  ALBUMIN 2.8* 2.6*   No results for input(s): "LIPASE", "AMYLASE" in the last 168 hours. No results for input(s): "AMMONIA" in the last 168 hours.  Coagulation Profile: No results for input(s): "INR", "PROTIME" in the last 168 hours.  Cardiac Enzymes: No results for input(s): "CKTOTAL", "CKMB", "CKMBINDEX", "TROPONINI" in the last 168 hours.  BNP (last 3 results) No results for input(s): "PROBNP" in the last 8760 hours.  Lipid Profile: No results for input(s): "CHOL", "HDL", "LDLCALC", "TRIG", "CHOLHDL", "LDLDIRECT" in the last 72 hours.  Thyroid  Function Tests: No results for input(s): "TSH", "T4TOTAL", "FREET4", "T3FREE", "THYROIDAB" in the last 72 hours.  Anemia Panel: No results for input(s): "VITAMINB12", "FOLATE", "FERRITIN", "TIBC", "IRON", "RETICCTPCT" in the last 72 hours.  Urine analysis:    Component Value Date/Time   COLORURINE YELLOW 05/12/2020 [place] 05/12/2020 1252   LABSPEC 1.010 10/17/2020 1657   PHURINE 6.0 10/17/2020 1657   GLUCOSEU NEGATIVE 10/17/2020 1657   HGBUR NEGATIVE 10/17/2020 [place] 10/17/2020 [place] 10/17/2020 1657   PROTEINUR NEGATIVE 10/17/2020 1657   UROBILINOGEN 0.2 10/17/2020 1657   NITRITE  NEGATIVE 10/17/2020 1657   LEUKOCYTESUR NEGATIVE 10/17/2020 1657    Sepsis Labs: Lactic Acid, Venous    Component Value Date/Time   LATICACIDVEN 2.9 (HH) 05/12/2020 1233    MICROBIOLOGY: Recent Results (from the past 240 hour(s))  SARS Coronavirus 2 by RT PCR (hospital order, performed in [facility] hospital lab) *cepheid single result test* Anterior Nasal Swab     Status: None   Collection Time: 04/19/22  9:20 PM   Specimen: Anterior Nasal Swab  Result Value Ref Range Status   SARS Coronavirus 2 by RT PCR NEGATIVE NEGATIVE Final    Comment: (NOTE) SARS-CoV-2 target nucleic acids are NOT DETECTED.  The SARS-CoV-2 RNA is generally detectable in upper and lower respiratory specimens during the acute phase of infection. The lowest concentration of SARS-CoV-2 viral copies this assay can detect is 250 copies / mL. A negative result does not preclude SARS-CoV-2 infection and should not be used as the sole basis for treatment or other patient management decisions.  A negative result may occur with improper specimen collection / handling, submission of specimen other than nasopharyngeal swab, presence of viral mutation(s) within the areas targeted by this assay, and inadequate number of viral copies (<250 copies / mL). A negative result  must be combined with clinical observations, patient history, and epidemiological information.  Fact Sheet for Patients:   https://www.patel.info/  Fact Sheet for Healthcare Providers: https://hall.com/  This test is not yet approved or  cleared by the [place] FDA and has been authorized for detection and/or diagnosis of SARS-CoV-2 by FDA under an Emergency Use Authorization (EUA).  This EUA will remain in effect (meaning this test can be used) for the duration of the COVID-19 declaration under Section 564(b)(1) of the Act, 21 U.S.C. section 360bbb-3(b)(1), unless the authorization is terminated or revoked sooner.  Performed at [facility], [place] [address] [place], [place] [address]   MRSA Next Gen by PCR, Nasal     Status: None   Collection Time: 04/20/22  1:52 AM   Specimen: Nasal Mucosa; Nasal Swab  Result Value Ref Range Status   MRSA by PCR Next Gen NOT DETECTED NOT DETECTED Final    Comment: (NOTE) The GeneXpert MRSA Assay (FDA approved for NASAL specimens only), is one component of a comprehensive MRSA colonization surveillance program. It is not intended to diagnose MRSA infection nor to guide or monitor treatment for MRSA infections. Test performance is not FDA approved in patients less than 59 years old. Performed at [facility], [place] [address], [place], [place] [address]     RADIOLOGY STUDIES/RESULTS: CT Angio Chest PE W and/or Wo Contrast  Result Date: 04/19/2022 CLINICAL DATA:  Shortness of breath, productive cough EXAM: CT ANGIOGRAPHY CHEST WITH CONTRAST TECHNIQUE: Multidetector CT imaging of the chest was performed using the standard protocol during bolus administration of intravenous contrast. Multiplanar CT image reconstructions and MIPs were obtained to evaluate the vascular anatomy. RADIATION DOSE REDUCTION: This exam was performed according to the departmental dose-optimization program which  includes automated exposure control, adjustment of the mA and/or kV according to patient size and/or use of iterative reconstruction technique. CONTRAST:  28mL OMNIPAQUE IOHEXOL 350 MG/ML SOLN COMPARISON:  Chest radiograph dated 04/19/2022 FINDINGS: Cardiovascular: Satisfactory opacification of the bilateral pulmonary arteries to the segmental level. No evidence of pulmonary embolism. Although not tailored for evaluation of the thoracic aorta, there is no evidence of thoracic aortic aneurysm or dissection. The heart is top-normal in size.  No pericardial  effusion. Mediastinum/Nodes: No suspicious mediastinal lymphadenopathy. Visualized thyroid is unremarkable. Lungs/Pleura: Multifocal patchy/nodular opacities in the left upper lobe, right middle lobe, and bilateral lower lobes, suspicious for multifocal pneumonia, left upper lobe predominant. Mild centrilobular emphysematous changes, upper lung predominant. Mild right lower lobe atelectasis. No pleural effusion or pneumothorax. Upper Abdomen: Visualized upper abdomen is grossly unremarkable. Musculoskeletal: Degenerative changes of the visualized thoracolumbar spine. Review of the MIP images confirms the above findings. IMPRESSION: No evidence of pulmonary embolism. Multifocal pneumonia, left upper lobe predominant. Emphysema (ICD10-J43.9). Electronically Signed   By: Charline Bills M.D.   On: 04/19/2022 23:51   DG Chest 2 View  Result Date: 04/19/2022 CLINICAL DATA:  Productive cough. Shortness of breath. Personal history of congestive heart failure. EXAM: CHEST - 2 VIEW COMPARISON:  One-view chest x-ray 05/21/2020 FINDINGS: The heart is enlarged. Diffuse interstitial pattern is present. Small effusions are suspected. Asymmetric left lower lobe airspace opacity noted. No pneumothorax is present. Multilevel degenerative changes are present in the thoracic spine. IMPRESSION: 1. Cardiomegaly with interstitial edema and small bilateral effusions compatible with  congestive heart failure. 2. Asymmetric left lower lobe airspace disease likely reflects atelectasis. Infection is not excluded. Electronically Signed   By: Marin Roberts M.D.   On: 04/19/2022 14:43     LOS: 0 days   Jeoffrey Massed, MD  [facility]    To contact the attending provider between 7A-7P or the covering provider during after hours 7P-7A, please log into the web site [email or web address] and access using universal [place] password for that web site. If you do not have the password, please call the hospital operator.  04/20/2022, 9:17 AM

## 2022-04-20 NOTE — Assessment & Plan Note (Signed)
On Bipap at night but recently this has malfunctioned at home - will resume tomorrow night as she appears more somnolent today

## 2022-04-20 NOTE — Assessment & Plan Note (Addendum)
Secondary to multifocal pneumonia.  Required 5 L on presentation. -Continue IV Rocephin and azithromycin -She was also given a one-time dose of IV Lasix 20 mg in the ED.  Will follow intake and output but low suspicion that she has any new onset CHF -Maintain O2 greater than 92% and wean as tolerated

## 2022-04-20 NOTE — Assessment & Plan Note (Signed)
BMI 50  

## 2022-04-20 NOTE — H&P (Signed)
History and Physical    Patient: Veronica Castaneda GEX:[id number] DOB: [birth year] DOA: 04/19/2022 DOS: the patient was seen and examined on 04/20/2022 PCP: Marcine Matar, MD  Patient coming from: Home  Chief Complaint:  Chief Complaint  Patient presents with   Shortness of Breath   HPI: Veronica Castaneda is a 61 y.o. female with medical history significant of prediabetes, persistent asthma, hypoventilation syndrome with likely OSA on BiPAP, morbid obesity who presents with increasing shortness of breath.  Virtual Spanish interpreter was used for this HPI Patient has noticed in the last few days she has had increasing shortness of breath with persistent coughing.  Has noticed low oxygen level on her pulse ox at home.Her Bipap at home also has not been working.  Thinks she had a slight fever.  No chest pain.  No nausea, vomiting or diarrhea.  Has some mild lower extremity edema but this is a chronic issue.  In the ED, she was afebrile with temperature of 99.1 and was hypoxic down to 80% requiring 5 L.   Chest x-ray cardiomegaly with interstitial edema and small bilateral effusion. CTA chest was negative for PE but was revealing for multifocal pneumonia. She has leukocytosis of 10.9.  No other significant electrolyte abnormalities.  AST and ALT mildly elevated at 49 and 58 respectively.  She was started on IV Rocephin and azithromycin in the ED.  Also given a IV 20 mg Lasix in the ED due to effusion seen on chest x-ray.  Hospitalist then consulted for admission.  Review of Systems: As mentioned in the history of present illness. All other systems reviewed and are negative. Past Medical History:  Diagnosis Date   Asthma    CHF (congestive heart failure) (HCC)    Dyspnea    Hyperlipidemia    Pneumonia    Pre-diabetes    Sleep apnea    Past Surgical History:  Procedure Laterality Date   INSERTION OF MESH  06/15/2021   Procedure: INSERTION OF MESH;  Surgeon:  Henrene Dodge, MD;  Location: [facility] ORS;  Service: General;;   NO PAST SURGERIES     No PAST SURGICAL HISTORY     XI ROBOTIC ASSISTED VENTRAL HERNIA N/A 06/15/2021   Procedure: XI ROBOTIC ASSISTED VENTRAL HERNIA;  Surgeon: Henrene Dodge, MD;  Location: [facility] ORS;  Service: General;  Laterality: N/A;   Social History:  reports that she has never smoked. She has never used smokeless tobacco. She reports that she does not drink alcohol and does not use drugs.  No Known Allergies  Family History  Problem Relation Age of Onset   Heart attack Mother    Heart disease Mother     Prior to Admission medications   Medication Sig Start Date End Date Taking? Authorizing Provider  acetaminophen (TYLENOL) 500 MG tablet Take 2 tablets (1,000 mg total) by mouth every 6 (six) hours as needed for mild pain. 06/15/21   Piscoya, Elita Quick, MD  albuterol (PROVENTIL) (2.5 MG/3ML) 0.083% nebulizer solution TAKE 3 MLS (2.5 MG TOTAL) BY NEBULIZATION EVERY 6 (SIX) HOURS AS NEEDED FOR WHEEZING OR SHORTNESS OF BREATH. 11/24/20 11/24/21  Marcine Matar, MD  albuterol (VENTOLIN HFA) 108 (90 Base) MCG/ACT inhaler INHALE 2 PUFFS INTO THE LUNGS EVERY 4 (FOUR) HOURS AS NEEDED FOR WHEEZING OR SHORTNESS OF BREATH. 03/21/22 03/21/23  Anders Simmonds, PA-C  atorvastatin (LIPITOR) 10 MG tablet TAKE 1 TABLET (10 MG TOTAL) BY MOUTH DAILY. 03/21/22 03/21/23  Anders Simmonds, PA-C  diclofenac Sodium (VOLTAREN)  1 % GEL Apply 2 g topically 4 (four) times daily. 11/28/21   Marcine Matar, MD  furosemide (LASIX) 20 MG tablet TAKE 1 TABLET (20 MG TOTAL) BY MOUTH DAILY AS NEEDED FOR LEG SELLING. DO NOT TAKE MORE THAN 1 TABLET PER DAY 03/21/22 03/21/23  Anders Simmonds, PA-C  gabapentin (NEURONTIN) 300 MG capsule Take 1 capsule (300 mg total) by mouth 3 (three) times daily. 03/21/22   Anders Simmonds, PA-C  metFORMIN (GLUCOPHAGE) 500 MG tablet TAKE 1/2 TABLET (250 MG TOTAL) BY MOUTH DAILY WITH BREAKFAST. 03/21/22 03/21/23  Anders Simmonds, PA-C   methocarbamol (ROBAXIN) 500 MG tablet Take 1 tablet (500 mg total) by mouth daily as needed for muscle spasms. 11/28/21   Marcine Matar, MD  Multiple Vitamin (MULTIVITAMIN WITH MINERALS) TABS tablet Take 1 tablet by mouth daily. 05/24/20   Swayze, Ava, DO  polyethylene glycol (MIRALAX / GLYCOLAX) 17 g packet Take 17 g by mouth daily. 05/24/20   Swayze, Ava, DO    Physical Exam: Vitals:   04/19/22 2245 04/19/22 2300 04/19/22 2345 04/20/22 0111  BP: 136/79 (!) 157/87 (!) 155/78   Pulse: 81 87 (!) 111   Resp: (!) 21 (!) 22 20   Temp:    98.3 F (36.8 C)  TempSrc:      SpO2: 93% (!) 81% 99%    Constitutional: NAD, calm, comfortable, ill-appearing fatigued appearing obese female laying in bed Eyes:  lids and conjunctivae normal ENMT: Mucous membranes are moist. Neck: normal, supple, Respiratory: clear to auscultation bilaterally, no wheezing, no crackles. Normal respiratory effort. No accessory muscle use.  Requiring 5 L with oxygen saturation around 96% Cardiovascular: Regular rate and rhythm, no murmurs / rubs / gallops.  +1 pitting edema of bilateral lower ankles. Abdomen soft, nondistended, nontender. Bowel sounds positive.  Musculoskeletal: no clubbing / cyanosis. No joint deformity upper and lower extremities. Good ROM, no contractures. Normal muscle tone.  Skin: no rashes, lesions, ulcers.  Neurologic: CN 2-12 grossly intact.  Strength 5/5 in all 4.  Psychiatric: Normal judgment and insight. Alert and oriented x 3. Normal mood. Data Reviewed:  See HPI  Assessment and Plan: * Acute respiratory failure with hypoxia (HCC) Secondary to multifocal pneumonia.  Required 5 L on presentation. -Continue IV Rocephin and azithromycin -She was also given a one-time dose of IV Lasix 20 mg in the ED.  Will follow intake and output but low suspicion that she has any new onset CHF -Maintain O2 greater than 92% and wean as tolerated  Abnormal LFTs Mildly elevated AST and ALT of 49 and 58  respectively.  Unclear etiology.  Could be transient.  Follow repeat in the morning.  Obesity hypoventilation syndrome (HCC) On Bipap at night but recently this has malfunctioned at home - will resume tomorrow night as she appears more somnolent today  Morbid obesity with body mass index (BMI) of 50.0 to 59.9 in adult (HCC) BMI >50      Advance Care Planning:   Code Status: Full Code   Consults: None  Family Communication: None  Severity of Illness: The appropriate patient status for this patient is INPATIENT. Inpatient status is judged to be reasonable and necessary in order to provide the required intensity of service to ensure the patient's safety. The patient's presenting symptoms, physical exam findings, and initial radiographic and laboratory data in the context of their chronic comorbidities is felt to place them at high risk for further clinical deterioration. Furthermore, it is not anticipated  that the patient will be medically stable for discharge from the hospital within 2 midnights of admission.   * I certify that at the point of admission it is my clinical judgment that the patient will require inpatient hospital care spanning beyond 2 midnights from the point of admission due to high intensity of service, high risk for further deterioration and high frequency of surveillance required.*  Author: Anselm Jungling, DO 04/20/2022 1:34 AM  For on call review [email or web address].

## 2022-04-20 NOTE — ED Provider Notes (Incomplete)
[facility] EMERGENCY DEPARTMENT Provider Note   CSN: [id number] Arrival date & time: 04/19/22  1344     History {Add pertinent medical, surgical, social history, OB history to HPI:1} Chief Complaint  Patient presents with  . Shortness of Breath    Veronica Castaneda is a 61 y.o. female.   Shortness of Breath  Patient is a 61 year old female with a history of sleep apnea, morbid obesity, DM 2, HLD who presents to the emergency department for evaluation of shortness of breath.  Patient states that 3 days ago she had onset of shortness of breath.  She has also had an intermittent mild cough which has since began to produce blood-tinged sputum.  She denies any recent long travel, hormone use, or any history of DVT/PE.  She denies any current chest pain.  She denies any abdominal pain, nausea vomiting diarrhea.  She has been taking over-the-counter Tylenol and other cold and flu medications.  Video Spanish interpreter was used for the duration of the patient encounter.     Home Medications Prior to Admission medications   Medication Sig Start Date End Date Taking? Authorizing Provider  acetaminophen (TYLENOL) 500 MG tablet Take 2 tablets (1,000 mg total) by mouth every 6 (six) hours as needed for mild pain. 06/15/21   Piscoya, Elita Quick, MD  albuterol (PROVENTIL) (2.5 MG/3ML) 0.083% nebulizer solution TAKE 3 MLS (2.5 MG TOTAL) BY NEBULIZATION EVERY 6 (SIX) HOURS AS NEEDED FOR WHEEZING OR SHORTNESS OF BREATH. 11/24/20 11/24/21  Marcine Matar, MD  albuterol (VENTOLIN HFA) 108 (90 Base) MCG/ACT inhaler INHALE 2 PUFFS INTO THE LUNGS EVERY 4 (FOUR) HOURS AS NEEDED FOR WHEEZING OR SHORTNESS OF BREATH. 03/21/22 03/21/23  Anders Simmonds, PA-C  atorvastatin (LIPITOR) 10 MG tablet TAKE 1 TABLET (10 MG TOTAL) BY MOUTH DAILY. 03/21/22 03/21/23  Anders Simmonds, PA-C  diclofenac Sodium (VOLTAREN) 1 % GEL Apply 2 g topically 4 (four) times daily. 11/28/21   Marcine Matar, MD   furosemide (LASIX) 20 MG tablet TAKE 1 TABLET (20 MG TOTAL) BY MOUTH DAILY AS NEEDED FOR LEG SELLING. DO NOT TAKE MORE THAN 1 TABLET PER DAY 03/21/22 03/21/23  Anders Simmonds, PA-C  gabapentin (NEURONTIN) 300 MG capsule Take 1 capsule (300 mg total) by mouth 3 (three) times daily. 03/21/22   Anders Simmonds, PA-C  metFORMIN (GLUCOPHAGE) 500 MG tablet TAKE 1/2 TABLET (250 MG TOTAL) BY MOUTH DAILY WITH BREAKFAST. 03/21/22 03/21/23  Anders Simmonds, PA-C  methocarbamol (ROBAXIN) 500 MG tablet Take 1 tablet (500 mg total) by mouth daily as needed for muscle spasms. 11/28/21   Marcine Matar, MD  Multiple Vitamin (MULTIVITAMIN WITH MINERALS) TABS tablet Take 1 tablet by mouth daily. 05/24/20   Swayze, Ava, DO  polyethylene glycol (MIRALAX / GLYCOLAX) 17 g packet Take 17 g by mouth daily. 05/24/20   Swayze, Ava, DO      Allergies    Patient has no known allergies.    Review of Systems   Review of Systems  Respiratory:  Positive for shortness of breath.     Physical Exam Updated Vital Signs BP 117/61   Pulse 82   Temp 98.5 F (36.9 C) (Oral)   Resp 20   SpO2 91%  Physical Exam Vitals and nursing note reviewed.  Constitutional:      General: She is not in acute distress.    Appearance: She is well-developed.  HENT:     Head: Normocephalic and atraumatic.  Mouth/Throat:     Mouth: Mucous membranes are moist.     Pharynx: Oropharynx is clear. No pharyngeal swelling or oropharyngeal exudate.  Eyes:     Extraocular Movements: Extraocular movements intact.     Conjunctiva/sclera: Conjunctivae normal.     Pupils: Pupils are equal, round, and reactive to light.  Neck:     Vascular: No JVD.  Cardiovascular:     Rate and Rhythm: Normal rate and regular rhythm.     Pulses: Normal pulses.     Heart sounds: Normal heart sounds. No murmur heard. Pulmonary:     Effort: Pulmonary effort is normal. No respiratory distress.     Breath sounds: Normal breath sounds. No decreased breath  sounds, wheezing, rhonchi or rales.  Abdominal:     Palpations: Abdomen is soft.     Tenderness: There is no abdominal tenderness.  Musculoskeletal:        General: No swelling.     Cervical back: Neck supple.     Right lower leg: No tenderness. No edema.     Left lower leg: No tenderness. No edema.  Lymphadenopathy:     Cervical: No cervical adenopathy.  Skin:    General: Skin is warm and dry.     Capillary Refill: Capillary refill takes less than 2 seconds.  Neurological:     General: No focal deficit present.     Mental Status: She is alert and oriented to person, place, and time.  Psychiatric:        Mood and Affect: Mood normal.     ED Results / Procedures / Treatments   Labs (all labs ordered are listed, but only abnormal results are displayed) Labs Reviewed  CBC WITH DIFFERENTIAL/PLATELET - Abnormal; Notable for the following components:      Result Value   WBC 10.9 (*)    MCV 101.9 (*)    Monocytes Absolute 1.2 (*)    Abs Immature Granulocytes 0.11 (*)    All other components within normal limits  COMPREHENSIVE METABOLIC PANEL - Abnormal; Notable for the following components:   Glucose, Bld 108 (*)    Calcium 8.7 (*)    Albumin 2.8 (*)    AST 49 (*)    ALT 58 (*)    All other components within normal limits  SARS CORONAVIRUS 2 BY RT PCR  BRAIN NATRIURETIC PEPTIDE    EKG None  Radiology DG Chest 2 View  Result Date: 04/19/2022 CLINICAL DATA:  Productive cough. Shortness of breath. Personal history of congestive heart failure. EXAM: CHEST - 2 VIEW COMPARISON:  One-view chest x-ray 05/21/2020 FINDINGS: The heart is enlarged. Diffuse interstitial pattern is present. Small effusions are suspected. Asymmetric left lower lobe airspace opacity noted. No pneumothorax is present. Multilevel degenerative changes are present in the thoracic spine. IMPRESSION: 1. Cardiomegaly with interstitial edema and small bilateral effusions compatible with congestive heart failure.  2. Asymmetric left lower lobe airspace disease likely reflects atelectasis. Infection is not excluded. Electronically Signed   By: Marin Roberts M.D.   On: 04/19/2022 14:43    Procedures Procedures  {Document cardiac monitor, telemetry assessment procedure when appropriate:1}  Medications Ordered in ED Medications - No data to display  ED Course/ Medical Decision Making/ A&P                           Medical Decision Making Amount and/or Complexity of Data Reviewed Radiology: ordered.  Risk Prescription drug management. Decision regarding hospitalization.   ***  {  Document critical care time when appropriate:1} {Document review of labs and clinical decision tools ie heart score, Chads2Vasc2 etc:1}  {Document your independent review of radiology images, and any outside records:1} {Document your discussion with family members, caretakers, and with consultants:1} {Document social determinants of health affecting pt's care:1} {Document your decision making why or why not admission, treatments were needed:1} Final Clinical Impression(s) / ED Diagnoses Final diagnoses:  None    Rx / DC Orders ED Discharge Orders     None

## 2022-04-20 NOTE — Assessment & Plan Note (Signed)
Mildly elevated AST and ALT of 49 and 58 respectively.  Unclear etiology.  Could be transient.  Follow repeat in the morning.

## 2022-04-20 NOTE — Progress Notes (Signed)
RT spoke with pt using interpreter. Pt states she does not want to wear Bipap tonight but maybe will tomorrow night. RT will continue to monitor

## 2022-04-21 LAB — CBC
HCT: 39.7 % (ref 36.0–46.0)
Hemoglobin: 12.3 g/dL (ref 12.0–15.0)
MCH: 31.4 pg (ref 26.0–34.0)
MCHC: 31 g/dL (ref 30.0–36.0)
MCV: 101.3 fL — ABNORMAL HIGH (ref 80.0–100.0)
Platelets: 217 10*3/uL (ref 150–400)
RBC: 3.92 MIL/uL (ref 3.87–5.11)
RDW: 12.9 % (ref 11.5–15.5)
WBC: 6.6 10*3/uL (ref 4.0–10.5)
nRBC: 0 % (ref 0.0–0.2)

## 2022-04-21 LAB — BASIC METABOLIC PANEL
Anion gap: 7 (ref 5–15)
BUN: <5 mg/dL — ABNORMAL LOW (ref 6–20)
CO2: 32 mmol/L (ref 22–32)
Calcium: 8.8 mg/dL — ABNORMAL LOW (ref 8.9–10.3)
Chloride: 102 mmol/L (ref 98–111)
Creatinine, Ser: 0.59 mg/dL (ref 0.44–1.00)
GFR, Estimated: >60 mL/min (ref >60)
Glucose, Bld: 112 mg/dL — ABNORMAL HIGH (ref 70–99)
Potassium: 3.9 mmol/L (ref 3.5–5.1)
Sodium: 141 mmol/L (ref 135–145)

## 2022-04-21 LAB — GLUCOSE, CAPILLARY
Glucose-Capillary: 103 mg/dL — ABNORMAL HIGH (ref 70–99)
Glucose-Capillary: 138 mg/dL — ABNORMAL HIGH (ref 70–99)
Glucose-Capillary: 102 mg/dL — ABNORMAL HIGH (ref 70–99)
Glucose-Capillary: 100 mg/dL — ABNORMAL HIGH (ref 70–99)

## 2022-04-21 MED ORDER — ORAL CARE MOUTH RINSE
15.0000 mL | OROMUCOSAL | Status: DC | PRN
Start: 2022-04-21 — End: 2022-04-23

## 2022-04-21 MED ORDER — ORAL CARE MOUTH RINSE
15.0000 mL | OROMUCOSAL | Status: DC
  Administered 2022-04-21 (×2): 15 mL via OROMUCOSAL

## 2022-04-21 NOTE — Plan of Care (Signed)
  Problem: Clinical Measurements: Goal: Cardiovascular complication will be avoided Outcome: Progressing   Problem: Activity: Goal: Risk for activity intolerance will decrease Outcome: Progressing   Problem: Coping: Goal: Level of anxiety will decrease Outcome: Progressing   Problem: Elimination: Goal: Will not experience complications related to bowel motility Outcome: Progressing Goal: Will not experience complications related to urinary retention Outcome: Progressing   Problem: Pain Managment: Goal: General experience of comfort will improve Outcome: Progressing   Problem: Clinical Measurements: Goal: Respiratory complications will improve Outcome: Not Progressing   

## 2022-04-21 NOTE — Progress Notes (Signed)
PROGRESS NOTE        PATIENT DETAILS Name: Veronica Castaneda Age: 61 y.o. Sex: female Date of Birth: December 15, 1960 Admit Date: 04/19/2022 Admitting Physician Anselm Jungling, DO AJG:[email or web address], Binnie Rail, MD  Brief Summary: Patient is a 61 y.o.  female OSA on CPAP/BiPAP, chronic persistent asthma, HLD, OA left knee, morbid obesity-who presented with 2-3-day history of subjective fever, productive cough and shortness of breath.  She was found to have acute hypoxic respiratory failure due to community-acquired pneumonia and subsequently admitted to the hospitalist service.  See below for further details.  Significant events: 7/27>> admit to [facility]-hypoxia-needing BiPAP in the ED-PNA on CT imaging.  Significant studies: 7/27>> CT angio: No PE, multifocal pneumonia-predominantly in the left upper lobe.    Significant microbiology data: 7/27>> COVID PCR: Negative  Procedures: None  Consults: None  Subjective: Feels overall better.  Tolerated BiPAP last night.  Continues to have some cough.  Objective: Vitals: Blood pressure 110/68, pulse 76, temperature 98.4 F (36.9 C), temperature source Oral, resp. rate 18, height 5' 0.63" (1.54 m), weight 111.2 kg, SpO2 (!) 89 %.   Exam: Gen Exam:Alert awake-not in any distress HEENT:atraumatic, normocephalic Chest: B/L clear to auscultation anteriorly CVS:S1S2 regular Abdomen:soft non tender, non distended Extremities:no edema Neurology: Non focal Skin: no rash   Pertinent Labs/Radiology:    Latest Ref Rng & Units 04/21/2022   12:23 AM 04/20/2022    5:53 AM 04/19/2022    2:23 PM  CBC  WBC 4.0 - 10.5 K/uL 6.6  8.3  10.9   Hemoglobin 12.0 - 15.0 g/dL 20.3  55.9  74.1   Hematocrit 36.0 - 46.0 % 39.7  42.6  43.6   Platelets 150 - 400 K/uL 217  196  201     Lab Results  Component Value Date   NA 141 04/21/2022   K 3.9 04/21/2022   CL 102 04/21/2022   CO2 32 04/21/2022      Assessment/Plan: Acute hypoxic  respiratory failure due to multifocal PNA: Clinically improving-less cough-continue Rocephin/Zithromax-continue with attempts to titrate down FiO2.  Encourage mobilization-incentive spirometry.  Minimally elevated transaminases: Likely of no clinical significance-follow periodically.  DM-2 (last A1c 5.7 on 11/24/2020): CBG stable-hold metformin-start SSI.  Recent Labs    04/20/22 1638 04/20/22 2100 04/21/22 0646  GLUCAP 140* 111* 103*     Chronic persistent asthma: Continue bronchodilators-not in flare.  OSA: On BiPAP at home.  Morbid obesity Estimated body mass index is 46.89 kg/m as calculated from the following:   Height as of this encounter: 5' 0.63" (1.54 m).   Weight as of this encounter: 111.2 kg.   Code status:   Code Status: Full Code   DVT Prophylaxis: enoxaparin (LOVENOX) injection 40 mg Start: 04/20/22 1000   Family Communication: Spouse at bedside   Disposition Plan: Status is: Inpatient Remains inpatient appropriate because: Hypoxia requiring O2-due to PNA-on IV antibiotics.  Needs another 24-48 hours of hospitalization prior to discharge.  Planned Discharge Destination:Home   Diet: Diet Order             Diet Heart Room service appropriate? Yes; Fluid consistency: Thin  Diet effective now                     Antimicrobial agents: Anti-infectives (From admission, onward)    Start     Dose/Rate Route  Frequency Ordered Stop   04/21/22 0000  azithromycin (ZITHROMAX) 500 mg in sodium chloride 0.9 % 250 mL IVPB        500 mg 250 mL/hr over 60 Minutes Intravenous Every 24 hours 04/20/22 0120     04/20/22 2200  cefTRIAXone (ROCEPHIN) 2 g in sodium chloride 0.9 % 100 mL IVPB        2 g 200 mL/hr over 30 Minutes Intravenous Every 24 hours 04/20/22 0658     04/20/22 0000  cefTRIAXone (ROCEPHIN) 1 g in sodium chloride 0.9 % 100 mL IVPB        1 g 200 mL/hr over 30 Minutes Intravenous  Once 04/19/22 2359 04/20/22 0111   04/20/22 0000  azithromycin  (ZITHROMAX) 500 mg in sodium chloride 0.9 % 250 mL IVPB        500 mg 250 mL/hr over 60 Minutes Intravenous  Once 04/19/22 2359 04/20/22 0217   04/20/22 0000  cefTRIAXone (ROCEPHIN) 1 g in sodium chloride 0.9 % 100 mL IVPB  Status:  Discontinued        1 g 200 mL/hr over 30 Minutes Intravenous Every 24 hours 04/20/22 0120 04/20/22 0658        MEDICATIONS: Scheduled Meds:  arformoterol  15 mcg Nebulization BID   atorvastatin  10 mg Oral Daily   budesonide (PULMICORT) nebulizer solution  0.25 mg Nebulization BID   enoxaparin (LOVENOX) injection  40 mg Subcutaneous Q24H   guaiFENesin  600 mg Oral BID   insulin aspart  0-9 Units Subcutaneous TID WC   mouth rinse  15 mL Mouth Rinse 4 times per day   Continuous Infusions:  azithromycin Stopped (04/21/22 0146)   cefTRIAXone (ROCEPHIN)  IV Stopped (04/20/22 2341)   PRN Meds:.acetaminophen, albuterol, ondansetron (ZOFRAN) IV, mouth rinse, mouth rinse   I have personally reviewed following labs and imaging studies  LABORATORY DATA: CBC: Recent Labs  Lab 04/19/22 1423 04/20/22 0553 04/21/22 0023  WBC 10.9* 8.3 6.6  NEUTROABS 7.1  --   --   HGB 13.7 13.2 12.3  HCT 43.6 42.6 39.7  MCV 101.9* 102.4* 101.3*  PLT 201 196 217     Basic Metabolic Panel: Recent Labs  Lab 04/19/22 1423 04/20/22 0553 04/21/22 0023  NA 140 141 141  K 4.4 3.9 3.9  CL 104 102 102  CO2 30 33* 32  GLUCOSE 108* 120* 112*  BUN 6 <5* <5*  CREATININE 0.57 0.50 0.59  CALCIUM 8.7* 8.5* 8.8*     GFR: Estimated Creatinine Clearance: 87.9 mL/min (by C-G formula based on SCr of 0.59 mg/dL).  Liver Function Tests: Recent Labs  Lab 04/19/22 1423 04/20/22 0553  AST 49* 29  ALT 58* 47*  ALKPHOS 126 113  BILITOT 0.4 0.9  PROT 7.1 7.0  ALBUMIN 2.8* 2.6*    No results for input(s): "LIPASE", "AMYLASE" in the last 168 hours. No results for input(s): "AMMONIA" in the last 168 hours.  Coagulation Profile: No results for input(s): "INR", "PROTIME"  in the last 168 hours.  Cardiac Enzymes: No results for input(s): "CKTOTAL", "CKMB", "CKMBINDEX", "TROPONINI" in the last 168 hours.  BNP (last 3 results) No results for input(s): "PROBNP" in the last 8760 hours.  Lipid Profile: No results for input(s): "CHOL", "HDL", "LDLCALC", "TRIG", "CHOLHDL", "LDLDIRECT" in the last 72 hours.  Thyroid Function Tests: No results for input(s): "TSH", "T4TOTAL", "FREET4", "T3FREE", "THYROIDAB" in the last 72 hours.  Anemia Panel: No results for input(s): "VITAMINB12", "FOLATE", "FERRITIN", "TIBC", "IRON", "RETICCTPCT" in the  last 72 hours.  Urine analysis:    Component Value Date/Time   COLORURINE YELLOW 05/12/2020 1252   APPEARANCEUR CLEAR 05/12/2020 1252   LABSPEC 1.010 10/17/2020 1657   PHURINE 6.0 10/17/2020 1657   GLUCOSEU NEGATIVE 10/17/2020 1657   HGBUR NEGATIVE 10/17/2020 1657   BILIRUBINUR NEGATIVE 10/17/2020 1657   KETONESUR NEGATIVE 10/17/2020 1657   PROTEINUR NEGATIVE 10/17/2020 1657   UROBILINOGEN 0.2 10/17/2020 1657   NITRITE NEGATIVE 10/17/2020 1657   LEUKOCYTESUR NEGATIVE 10/17/2020 1657    Sepsis Labs: Lactic Acid, Venous    Component Value Date/Time   LATICACIDVEN 2.9 (HH) 05/12/2020 1233    MICROBIOLOGY: Recent Results (from the past 240 hour(s))  SARS Coronavirus 2 by RT PCR (hospital order, performed in [facility] hospital lab) *cepheid single result test* Anterior Nasal Swab     Status: None   Collection Time: 04/19/22  9:20 PM   Specimen: Anterior Nasal Swab  Result Value Ref Range Status   SARS Coronavirus 2 by RT PCR NEGATIVE NEGATIVE Final    Comment: (NOTE) SARS-CoV-2 target nucleic acids are NOT DETECTED.  The SARS-CoV-2 RNA is generally detectable in upper and lower respiratory specimens during the acute phase of infection. The lowest concentration of SARS-CoV-2 viral copies this assay can detect is 250 copies / mL. A negative result does not preclude SARS-CoV-2 infection and should not be used as  the sole basis for treatment or other patient management decisions.  A negative result may occur with improper specimen collection / handling, submission of specimen other than nasopharyngeal swab, presence of viral mutation(s) within the areas targeted by this assay, and inadequate number of viral copies (<250 copies / mL). A negative result must be combined with clinical observations, patient history, and epidemiological information.  Fact Sheet for Patients:   RoadLapTop.co.za  Fact Sheet for Healthcare Providers: http://kim-miller.com/  This test is not yet approved or  cleared by the [place] FDA and has been authorized for detection and/or diagnosis of SARS-CoV-2 by FDA under an Emergency Use Authorization (EUA).  This EUA will remain in effect (meaning this test can be used) for the duration of the COVID-19 declaration under Section 564(b)(1) of the Act, 21 U.S.C. section 360bbb-3(b)(1), unless the authorization is terminated or revoked sooner.  Performed at [facility], [address], [place], [place] [address]   MRSA Next Gen by PCR, Nasal     Status: None   Collection Time: 04/20/22  1:52 AM   Specimen: Nasal Mucosa; Nasal Swab  Result Value Ref Range Status   MRSA by PCR Next Gen NOT DETECTED NOT DETECTED Final    Comment: (NOTE) The GeneXpert MRSA Assay (FDA approved for NASAL specimens only), is one component of a comprehensive MRSA colonization surveillance program. It is not intended to diagnose MRSA infection nor to guide or monitor treatment for MRSA infections. Test performance is not FDA approved in patients less than 15 years old. Performed at [facility], [address], [place], [place] [address]     RADIOLOGY STUDIES/RESULTS: CT Angio Chest PE W and/or Wo Contrast  Result Date: 04/19/2022 CLINICAL DATA:  Shortness of breath, productive cough EXAM: CT ANGIOGRAPHY CHEST WITH CONTRAST  TECHNIQUE: Multidetector CT imaging of the chest was performed using the standard protocol during bolus administration of intravenous contrast. Multiplanar CT image reconstructions and MIPs were obtained to evaluate the vascular anatomy. RADIATION DOSE REDUCTION: This exam was performed according to the departmental dose-optimization program which includes automated exposure control, adjustment  of the mA and/or kV according to patient size and/or use of iterative reconstruction technique. CONTRAST:  8mL OMNIPAQUE IOHEXOL 350 MG/ML SOLN COMPARISON:  Chest radiograph dated 04/19/2022 FINDINGS: Cardiovascular: Satisfactory opacification of the bilateral pulmonary arteries to the segmental level. No evidence of pulmonary embolism. Although not tailored for evaluation of the thoracic aorta, there is no evidence of thoracic aortic aneurysm or dissection. The heart is top-normal in size.  No pericardial effusion. Mediastinum/Nodes: No suspicious mediastinal lymphadenopathy. Visualized thyroid is unremarkable. Lungs/Pleura: Multifocal patchy/nodular opacities in the left upper lobe, right middle lobe, and bilateral lower lobes, suspicious for multifocal pneumonia, left upper lobe predominant. Mild centrilobular emphysematous changes, upper lung predominant. Mild right lower lobe atelectasis. No pleural effusion or pneumothorax. Upper Abdomen: Visualized upper abdomen is grossly unremarkable. Musculoskeletal: Degenerative changes of the visualized thoracolumbar spine. Review of the MIP images confirms the above findings. IMPRESSION: No evidence of pulmonary embolism. Multifocal pneumonia, left upper lobe predominant. Emphysema (ICD10-J43.9). Electronically Signed   By: Charline Bills M.D.   On: 04/19/2022 23:51   DG Chest 2 View  Result Date: 04/19/2022 CLINICAL DATA:  Productive cough. Shortness of breath. Personal history of congestive heart failure. EXAM: CHEST - 2 VIEW COMPARISON:  One-view chest x-ray 05/21/2020  FINDINGS: The heart is enlarged. Diffuse interstitial pattern is present. Small effusions are suspected. Asymmetric left lower lobe airspace opacity noted. No pneumothorax is present. Multilevel degenerative changes are present in the thoracic spine. IMPRESSION: 1. Cardiomegaly with interstitial edema and small bilateral effusions compatible with congestive heart failure. 2. Asymmetric left lower lobe airspace disease likely reflects atelectasis. Infection is not excluded. Electronically Signed   By: Marin Roberts M.D.   On: 04/19/2022 14:43     LOS: 1 day   Jeoffrey Massed, MD  [facility]    To contact the attending provider between 7A-7P or the covering provider during after hours 7P-7A, please log into the web site [email or web address] and access using universal [place] password for that web site. If you do not have the password, please call the hospital operator.  04/21/2022, 11:02 AM

## 2022-04-21 NOTE — Progress Notes (Signed)
Removed pt from BIPAP to RA per MD request. MD ordered pts sats to be greater than 88%. Sats dropped to 85%. Pt was placed on 2L [place] and incentive spirometer was started. Sats remained at 86%. O2 was increased to 4L with sats increasing to 89%. RN at bedside.

## 2022-04-21 NOTE — Plan of Care (Signed)
  Problem: Clinical Measurements: Goal: Respiratory complications will improve Outcome: Progressing Goal: Cardiovascular complication will be avoided Outcome: Progressing   Problem: Activity: Goal: Risk for activity intolerance will decrease Outcome: Progressing   Problem: Nutrition: Goal: Adequate nutrition will be maintained Outcome: Progressing   Problem: Coping: Goal: Level of anxiety will decrease Outcome: Progressing   Problem: Elimination: Goal: Will not experience complications related to bowel motility Outcome: Progressing Goal: Will not experience complications related to urinary retention Outcome: Progressing   Problem: Pain Managment: Goal: General experience of comfort will improve Outcome: Progressing   

## 2022-04-22 ENCOUNTER — Inpatient Hospital Stay (HOSPITAL_COMMUNITY): Payer: Self-pay

## 2022-04-22 LAB — CBC
HCT: 40.5 % (ref 36.0–46.0)
Hemoglobin: 13.1 g/dL (ref 12.0–15.0)
MCH: 32.3 pg (ref 26.0–34.0)
MCHC: 32.3 g/dL (ref 30.0–36.0)
MCV: 99.8 fL (ref 80.0–100.0)
Platelets: 208 10*3/uL (ref 150–400)
RBC: 4.06 MIL/uL (ref 3.87–5.11)
RDW: 12.6 % (ref 11.5–15.5)
WBC: 7 10*3/uL (ref 4.0–10.5)
nRBC: 0 % (ref 0.0–0.2)

## 2022-04-22 LAB — GLUCOSE, CAPILLARY
Glucose-Capillary: 102 mg/dL — ABNORMAL HIGH (ref 70–99)
Glucose-Capillary: 122 mg/dL — ABNORMAL HIGH (ref 70–99)
Glucose-Capillary: 159 mg/dL — ABNORMAL HIGH (ref 70–99)
Glucose-Capillary: 114 mg/dL — ABNORMAL HIGH (ref 70–99)

## 2022-04-22 MED ORDER — AZITHROMYCIN 500 MG PO TABS
500.0000 mg | ORAL_TABLET | Freq: Every day | ORAL | Status: DC
  Administered 2022-04-23: 500 mg via ORAL
  Filled 2022-04-22: qty 1

## 2022-04-22 MED ORDER — LACTATED RINGERS IV BOLUS: 500.0000 mL | Freq: Once | INTRAVENOUS | Status: DC

## 2022-04-22 NOTE — Evaluation (Signed)
Physical Therapy Evaluation Patient Details Name: Veronica Castaneda MRN: [id number] DOB: 1961-08-09 Today's Date: 04/22/2022  History of Present Illness  61 y.o. female presents to [facility] on 04/19/2022 admitted with acute respiratory failure with hypoxia. Imaging significant for cardiomegaly with interstitial edema and small bilateral effusion, and is revealing for multifocal pneumonia. PMH: Asthma, CHF, HLD, Sleep Apnea,  Clinical Impression  Pt presents to PT with activity intolerance secondary to acute respiratory failure with hypoxia. Pt tolerates therapy well today, ambulating limited community distances with no AD. Pt ambulates on 3L O2 with sats maintained >87%. Continued therapy will assist the pt in building activity tolerance for progression of independence and to assist in weaning her to RA (baseline).      Recommendations for follow up therapy are one component of a multi-disciplinary discharge planning process, led by the attending physician.  Recommendations may be updated based on patient status, additional functional criteria and insurance authorization.  Follow Up Recommendations Outpatient PT      Assistance Recommended at Discharge PRN  Patient can return home with the following  Assistance with cooking/housework;Assist for transportation;Help with stairs or ramp for entrance    Equipment Recommendations None recommended by PT  Recommendations for Other Services       Functional Status Assessment Patient has had a recent decline in their functional status and demonstrates the ability to make significant improvements in function in a reasonable and predictable amount of time.     Precautions / Restrictions Precautions Precautions: Fall Restrictions Weight Bearing Restrictions: No      Mobility  Bed Mobility Overal bed mobility: Independent                  Transfers Overall transfer level: Independent Equipment used: None                     Ambulation/Gait Ambulation/Gait assistance: Independent Gait Distance (Feet): 400 Feet Assistive device: None Gait Pattern/deviations: Step-through pattern, Decreased stride length Gait velocity: reduced Gait velocity interpretation: 1.31 - 2.62 ft/sec, indicative of limited community ambulator   General Gait Details: Pt ambulates on 3L O2 with sats maintained >87%; Pt performs horizontal and vertical scan gait tasks  Stairs            Wheelchair Mobility    Modified Rankin (Stroke Patients Only)       Balance Overall balance assessment: No apparent balance deficits (not formally assessed)                                           Pertinent Vitals/Pain Pain Assessment Pain Assessment: No/denies pain    Home Living Family/patient expects to be discharged to:: Private residence Living Arrangements: Spouse/significant other;Children Available Help at Discharge: Family;Available 24 hours/day Type of Home: House Home Access: Stairs to enter Entrance Stairs-Rails: None Entrance Stairs-Number of Steps: 3   Home Layout: One level Home Equipment: Agricultural consultant (2 wheels) Additional Comments: Reports her daughter provides physical assistance if necessary    Prior Function Prior Level of Function : Independent/Modified Independent             Mobility Comments: Ind baseline ADLs Comments: Ind baseline     Hand Dominance        Extremity/Trunk Assessment   Upper Extremity Assessment Upper Extremity Assessment: Overall WFL for tasks assessed    Lower Extremity Assessment Lower Extremity Assessment:  Overall [facility] for tasks assessed    Cervical / Trunk Assessment Cervical / Trunk Assessment: Normal  Communication   Communication: Prefers language other than [place];Interpreter utilized (Spanish)  Cognition Arousal/Alertness: Awake/alert Behavior During Therapy: WFL for tasks assessed/performed Overall Cognitive Status:  Within Functional Limits for tasks assessed                                          General Comments General comments (skin integrity, edema, etc.): VSS on 3L O2; pt reports dizziness during her admission but none during session    Exercises     Assessment/Plan    PT Assessment Patient needs continued PT services  PT Problem List Decreased activity tolerance;Cardiopulmonary status limiting activity       PT Treatment Interventions DME instruction;Stair training;Gait training;Functional mobility training;Therapeutic activities;Therapeutic exercise;Balance training;Patient/family education    PT Goals (Current goals can be found in the Care Plan section)  Acute Rehab PT Goals Patient Stated Goal: Walk PT Goal Formulation: With patient Time For Goal Achievement: 05/06/22 Potential to Achieve Goals: Good    Frequency Min 3X/week     Co-evaluation               AM-PAC PT "6 Clicks" Mobility  Outcome Measure Help needed turning from your back to your side while in a flat bed without using bedrails?: None Help needed moving from lying on your back to sitting on the side of a flat bed without using bedrails?: None Help needed moving to and from a bed to a chair (including a wheelchair)?: None Help needed standing up from a chair using your arms (e.g., wheelchair or bedside chair)?: None Help needed to walk in hospital room?: None Help needed climbing 3-5 steps with a railing? : A Little 6 Click Score: 23    End of Session Equipment Utilized During Treatment: Oxygen Activity Tolerance: Patient tolerated treatment well Patient left: in bed;with call bell/phone within reach;with family/visitor present Nurse Communication: Mobility status PT Visit Diagnosis: Other abnormalities of gait and mobility (R26.89);Dizziness and giddiness (R42)    Time: 8916-9450 PT Time Calculation (min) (ACUTE ONLY): 31 min   Charges:   PT Evaluation $PT Eval Low  Complexity: 1 Low          Murlean Hark, SPT Acute Rehabilitation Office #: [phone]   Murlean Hark 04/22/2022, 4:56 PM

## 2022-04-22 NOTE — Progress Notes (Signed)
PROGRESS NOTE        PATIENT DETAILS Name: Veronica Castaneda Age: 61 y.o. Sex: female Date of Birth: Aug 07, 1961 Admit Date: 04/19/2022 Admitting Physician Anselm Jungling, DO BDZ:[id number], Binnie Rail, MD  Brief Summary: Patient is a 61 y.o.  female OSA on CPAP/BiPAP, chronic persistent asthma, HLD, OA left knee, morbid obesity-who presented with 2-3-day history of subjective fever, productive cough and shortness of breath.  She was found to have acute hypoxic respiratory failure due to community-acquired pneumonia and subsequently admitted to the hospitalist service.  See below for further details.  Significant events: 7/27>> admit to [facility]-hypoxia-needing BiPAP in the ED-PNA on CT imaging.  Significant studies: 7/27>> CT angio: No PE, multifocal pneumonia-predominantly in the left upper lobe.    Significant microbiology data: 7/27>> COVID PCR: Negative  Procedures: None  Consults: None  Subjective:   Patient in bed, appears comfortable, denies any headache, no fever, no chest pain or pressure, no shortness of breath , no abdominal pain. No new focal weakness.   Objective: Vitals: Blood pressure (!) 101/50, pulse 64, temperature 98.3 F (36.8 C), temperature source Oral, resp. rate 17, height 5' 0.63" (1.54 m), weight 111.2 kg, SpO2 94 %.   Exam:  Awake Alert, No new F.N deficits, Normal affect [place].AT,PERRAL Supple Neck, No JVD,   Symmetrical Chest wall movement, Good air movement bilaterally, CTAB RRR,No Gallops, Rubs or new Murmurs,  +ve B.Sounds, Abd Soft, No tenderness,   No Cyanosis, Clubbing or edema   Assessment/Plan:  Acute hypoxic respiratory failure due to multifocal PNA: Clinically improving-less cough-continue Rocephin/Zithromax-continue, clinically improving advance activity titrated down oxygen, added flutter valve and I-S for pulmonary toiletry encouraged to sit up in chair in the daytime.  Cultures negative thus  far.  Minimally elevated transaminases: Likely of no clinical significance-follow periodically.  Trend improving continue to monitor.  Chronic persistent asthma: Continue bronchodilators-not in flare.  Morbid obesity with BMI of 46 with OSA: On BiPAP at home.  Continue nighttime care, follow with PCP for weight loss  DM-2 (last A1c 5.7 on 11/24/2020): On SSI and metformin on hold.  Monitor and adjust  Lab Results  Component Value Date   HGBA1C 5.8 03/21/2022   CBG (last 3)  Recent Labs    04/21/22 1627 04/21/22 2141 04/22/22 0619  GLUCAP 102* 100* 102*     Code status:   Code Status: Full Code   DVT Prophylaxis: enoxaparin (LOVENOX) injection 40 mg Start: 04/20/22 1000   Family Communication: Spouse at bedside   Disposition Plan: Status is: Inpatient Remains inpatient appropriate because: Hypoxia requiring O2-due to PNA-on IV antibiotics.  Needs another 24-48 hours of hospitalization prior to discharge.  Planned Discharge Destination:Home   Diet: Diet Order             Diet Heart Room service appropriate? Yes; Fluid consistency: Thin  Diet effective now                   MEDICATIONS: Scheduled Meds:  arformoterol  15 mcg Nebulization BID   atorvastatin  10 mg Oral Daily   [START ON 04/23/2022] azithromycin  500 mg Oral Daily   budesonide (PULMICORT) nebulizer solution  0.25 mg Nebulization BID   enoxaparin (LOVENOX) injection  40 mg Subcutaneous Q24H   guaiFENesin  600 mg Oral BID   insulin aspart  0-9 Units Subcutaneous TID  WC   mouth rinse  15 mL Mouth Rinse 4 times per day   Continuous Infusions:  cefTRIAXone (ROCEPHIN)  IV 2 g (04/21/22 2223)   lactated ringers     PRN Meds:.acetaminophen, albuterol, ondansetron (ZOFRAN) IV, mouth rinse, mouth rinse   I have personally reviewed following labs and imaging studies  LABORATORY DATA:  Recent Labs  Lab 04/19/22 1423 04/20/22 0553 04/21/22 0023 04/22/22 0111  WBC 10.9* 8.3 6.6 7.0  HGB 13.7  13.2 12.3 13.1  HCT 43.6 42.6 39.7 40.5  PLT 201 196 217 208  MCV 101.9* 102.4* 101.3* 99.8  MCH 32.0 31.7 31.4 32.3  MCHC 31.4 31.0 31.0 32.3  RDW 13.1 13.1 12.9 12.6  LYMPHSABS 2.2  --   --   --   MONOABS 1.2*  --   --   --   EOSABS 0.3  --   --   --   BASOSABS 0.1  --   --   --     Recent Labs  Lab 04/19/22 1423 04/20/22 0553 04/21/22 0023  NA 140 141 141  K 4.4 3.9 3.9  CL 104 102 102  CO2 30 33* 32  GLUCOSE 108* 120* 112*  BUN 6 <5* <5*  CREATININE 0.57 0.50 0.59  CALCIUM 8.7* 8.5* 8.8*  AST 49* 29  --   ALT 58* 47*  --   ALKPHOS 126 113  --   BILITOT 0.4 0.9  --   ALBUMIN 2.8* 2.6*  --   BNP  --  48.9  --            RADIOLOGY STUDIES/RESULTS: DG Chest Port 1 View  Result Date: 04/22/2022 CLINICAL DATA:  61 year old female with history of acute respiratory failure with hypoxia. EXAM: PORTABLE CHEST 1 VIEW COMPARISON:  Chest x-ray 03/20/2022. FINDINGS: Lung volumes are low. Diffuse peribronchial cuffing. Patchy ill-defined opacities are noted throughout the mid to lower lungs bilaterally. Blunting of both costophrenic sulci indicative of probable trace bilateral pleural effusions. No pneumothorax. No cephalization of the pulmonary vasculature. Mild cardiomegaly. Upper mediastinal contours are within normal limits allowing for patient rotation to the left. IMPRESSION: 1. The appearance of the chest is most compatible with bronchitis with severe multilobar bilateral bronchopneumonia, better demonstrated on recent chest CT. 2. Possible trace bilateral pleural effusions. 3. Mild cardiomegaly. Electronically Signed   By: Trudie Reed M.D.   On: 04/22/2022 08:02     LOS: 2 days   Signature  Susa Raring M.D on 04/22/2022 at 8:28 AM   -  To page go to [email or web address]

## 2022-04-23 ENCOUNTER — Other Ambulatory Visit (HOSPITAL_COMMUNITY): Payer: Self-pay

## 2022-04-23 ENCOUNTER — Telehealth: Payer: Self-pay | Admitting: Internal Medicine

## 2022-04-23 LAB — COMPREHENSIVE METABOLIC PANEL
ALT: 26 U/L (ref 0–44)
AST: 15 U/L (ref 15–41)
Albumin: 2.5 g/dL — ABNORMAL LOW (ref 3.5–5.0)
Alkaline Phosphatase: 114 U/L (ref 38–126)
Anion gap: 5 (ref 5–15)
BUN: 10 mg/dL (ref 6–20)
CO2: 32 mmol/L (ref 22–32)
Calcium: 8.9 mg/dL (ref 8.9–10.3)
Chloride: 103 mmol/L (ref 98–111)
Creatinine, Ser: 0.68 mg/dL (ref 0.44–1.00)
GFR, Estimated: >60 mL/min (ref >60)
Glucose, Bld: 113 mg/dL — ABNORMAL HIGH (ref 70–99)
Potassium: 4.1 mmol/L (ref 3.5–5.1)
Sodium: 140 mmol/L (ref 135–145)
Total Bilirubin: 0.8 mg/dL (ref 0.3–1.2)
Total Protein: 6.4 g/dL — ABNORMAL LOW (ref 6.5–8.1)

## 2022-04-23 LAB — GLUCOSE, CAPILLARY
Glucose-Capillary: 105 mg/dL — ABNORMAL HIGH (ref 70–99)
Glucose-Capillary: 92 mg/dL (ref 70–99)

## 2022-04-23 LAB — CBC WITH DIFFERENTIAL/PLATELET
Abs Immature Granulocytes: 0.06 10*3/uL (ref 0.00–0.07)
Basophils Absolute: 0.1 10*3/uL (ref 0.0–0.1)
Basophils Relative: 1 %
Eosinophils Absolute: 0.3 10*3/uL (ref 0.0–0.5)
Eosinophils Relative: 4 %
HCT: 39.9 % (ref 36.0–46.0)
Hemoglobin: 12.8 g/dL (ref 12.0–15.0)
Immature Granulocytes: 1 %
Lymphocytes Relative: 25 %
Lymphs Abs: 1.7 10*3/uL (ref 0.7–4.0)
MCH: 32.1 pg (ref 26.0–34.0)
MCHC: 32.1 g/dL (ref 30.0–36.0)
MCV: 100 fL (ref 80.0–100.0)
Monocytes Absolute: 0.7 10*3/uL (ref 0.1–1.0)
Monocytes Relative: 10 %
Neutro Abs: 3.9 10*3/uL (ref 1.7–7.7)
Neutrophils Relative %: 59 %
Platelets: 204 10*3/uL (ref 150–400)
RBC: 3.99 MIL/uL (ref 3.87–5.11)
RDW: 12.6 % (ref 11.5–15.5)
WBC: 6.7 10*3/uL (ref 4.0–10.5)
nRBC: 0 % (ref 0.0–0.2)

## 2022-04-23 LAB — BRAIN NATRIURETIC PEPTIDE: B Natriuretic Peptide: 25.6 pg/mL (ref 0.0–100.0)

## 2022-04-23 LAB — MAGNESIUM: Magnesium: 1.9 mg/dL (ref 1.7–2.4)

## 2022-04-23 MED ORDER — CEPHALEXIN 500 MG PO CAPS
500.0000 mg | ORAL_CAPSULE | Freq: Three times a day (TID) | ORAL | 0 refills | Status: AC
  Filled 2022-04-23: qty 12, 4d supply, fill #0

## 2022-04-23 MED ORDER — AZITHROMYCIN 500 MG PO TABS
500.0000 mg | ORAL_TABLET | Freq: Every day | ORAL | 0 refills | Status: DC
  Filled 2022-04-23: qty 4, 4d supply, fill #0

## 2022-04-23 NOTE — Progress Notes (Signed)
SATURATION QUALIFICATIONS: (This note is used to comply with regulatory documentation for home oxygen)  Patient Saturations on Room Air at Rest = 89%  Patient Saturations on Room Air while Ambulating = 86%  Patient Saturations on 2 Liters of oxygen while Ambulating = 91%  Please briefly explain why patient needs home oxygen: desats with ambulation

## 2022-04-23 NOTE — Plan of Care (Signed)
  Problem: Education: Goal: Knowledge of General Education information will improve Description: Including pain rating scale, medication(s)/side effects and non-pharmacologic comfort measures Outcome: Progressing   Problem: Health Behavior/Discharge Planning: Goal: Ability to manage health-related needs will improve Outcome: Progressing   Problem: Clinical Measurements: Goal: Ability to maintain clinical measurements within normal limits will improve Outcome: Progressing Goal: Will remain free from infection Outcome: Progressing Goal: Diagnostic test results will improve Outcome: Progressing Goal: Respiratory complications will improve Outcome: Progressing Goal: Cardiovascular complication will be avoided Outcome: Progressing   Problem: Activity: Goal: Risk for activity intolerance will decrease Outcome: Progressing   Problem: Nutrition: Goal: Adequate nutrition will be maintained Outcome: Progressing   Problem: Coping: Goal: Level of anxiety will decrease Outcome: Progressing   Problem: Elimination: Goal: Will not experience complications related to bowel motility Outcome: Progressing Goal: Will not experience complications related to urinary retention Outcome: Progressing   Problem: Pain Managment: Goal: General experience of comfort will improve Outcome: Progressing   Problem: Safety: Goal: Ability to remain free from injury will improve Outcome: Progressing   Problem: Skin Integrity: Goal: Risk for impaired skin integrity will decrease Outcome: Progressing   Problem: Activity: Goal: Ability to tolerate increased activity will improve Outcome: Progressing   Problem: Clinical Measurements: Goal: Ability to maintain a body temperature in the normal range will improve Outcome: Progressing   Problem: Respiratory: Goal: Ability to maintain adequate ventilation will improve Outcome: Progressing Goal: Ability to maintain a clear airway will improve Outcome:  Progressing   Problem: Education: Goal: Ability to describe self-care measures that may prevent or decrease complications (Diabetes Survival Skills Education) will improve Outcome: Progressing Goal: Individualized Educational Video(s) Outcome: Progressing   Problem: Coping: Goal: Ability to adjust to condition or change in health will improve Outcome: Progressing   Problem: Fluid Volume: Goal: Ability to maintain a balanced intake and output will improve Outcome: Progressing   Problem: Health Behavior/Discharge Planning: Goal: Ability to identify and utilize available resources and services will improve Outcome: Progressing Goal: Ability to manage health-related needs will improve Outcome: Progressing   Problem: Metabolic: Goal: Ability to maintain appropriate glucose levels will improve Outcome: Progressing   Problem: Nutritional: Goal: Maintenance of adequate nutrition will improve Outcome: Progressing Goal: Progress toward achieving an optimal weight will improve Outcome: Progressing   Problem: Skin Integrity: Goal: Risk for impaired skin integrity will decrease Outcome: Progressing   Problem: Tissue Perfusion: Goal: Adequacy of tissue perfusion will improve Outcome: Progressing   Problem: Education: Goal: Ability to demonstrate management of disease process will improve Outcome: Progressing Goal: Ability to verbalize understanding of medication therapies will improve Outcome: Progressing Goal: Individualized Educational Video(s) Outcome: Progressing   Problem: Activity: Goal: Capacity to carry out activities will improve Outcome: Progressing   Problem: Cardiac: Goal: Ability to achieve and maintain adequate cardiopulmonary perfusion will improve Outcome: Progressing

## 2022-04-23 NOTE — TOC Progression Note (Addendum)
Transition of Care [facility]) - Progression Note    Patient Details  Name: Veronica Castaneda MRN: [id number] Date of Birth: [birth year]  Transition of [facility] CM/SW Contact  Beckie Busing, RN Phone Number:[phone]  04/23/2022, 9:47 AM  Clinical Narrative:    Patient with no insurance will need charity DME rolling walker and O2. Nurse has been made aware that qualifying ambulatory sats note will need to be entered in order to receive home O2. DME needs have been called to [employer] with [employer].   1130 CM received call from [facility] with [employer] stating that patient received home O2 in 2020 and equipment was never returned and that [employer] is unable to communicate with patient due to patient speaks Spanish. CM at bedside with Spanish interpreter [place] . Family at bedside states that they still have O2 concentrator at home and were unable to contact office to return equipment. CM has explained that patient will need O2 and that DME company will reach out for setup. Interpreter has agreed to work with [employer] for communication. Patient and family are agreeable. CM called Elease Hashimoto at [employer] and provided her with Interpreter Graciella's  contact information. O2 to be delivered to the bedside.        Expected Discharge Plan and Services           Expected Discharge Date: 04/23/22                                     Social Determinants of Health (SDOH) Interventions    Readmission Risk Interventions     No data to display

## 2022-04-23 NOTE — Progress Notes (Signed)
Discharge instructions provided to patient and family with interpreter present at bedside. All questions answered family states they feel confident and understand all instructions.

## 2022-04-23 NOTE — Discharge Instructions (Signed)
Follow with Primary MD Marcine Matar, MD in 7 days   Get CBC, CMP, 2 view Chest X ray -  checked next visit within 1 week by Primary MD    Activity: As tolerated with Full fall precautions use walker/cane & assistance as needed  Disposition Home    Diet: Heart Health   Special Instructions: If you have smoked or chewed Tobacco  in the last 2 yrs please stop smoking, stop any regular Alcohol  and or any Recreational drug use.  On your next visit with your primary care physician please Get Medicines reviewed and adjusted.  Please request your Prim.MD to go over all Hospital Tests and Procedure/Radiological results at the follow up, please get all Hospital records sent to your Prim MD by signing hospital release before you go home.  If you experience worsening of your admission symptoms, develop shortness of breath, life threatening emergency, suicidal or homicidal thoughts you must seek medical attention immediately by calling 911 or calling your MD immediately  if symptoms less severe.  You Must read complete instructions/literature along with all the possible adverse reactions/side effects for all the Medicines you take and that have been prescribed to you. Take any new Medicines after you have completely understood and accpet all the possible adverse reactions/side effects.

## 2022-04-23 NOTE — Discharge Summary (Signed)
Veronica Castaneda BCW:[id number] DOB: 08-Jan-1961 DOA: 04/19/2022  PCP: Marcine Matar, MD  Admit date: 04/19/2022  Discharge date: 04/23/2022  Admitted From: Home   Disposition:  Home   Recommendations for Outpatient Follow-up:   Follow up with PCP in 1-2 weeks  PCP Please obtain BMP/CBC, 2 view CXR in 1week,  (see Discharge instructions)   PCP Please follow up on the following pending results: Monitor blood pressure, check CBC, CMP, two-view chest x-ray in 7 to 10 days.   Home Health: None   Equipment/Devices: Home oxygen 2-3 L as needed, nighttime CPAP to continue as before. Consultations: None  Discharge Condition: Stable    CODE STATUS: Full    Diet Recommendation: Heart Healthy   Chief Complaint  Patient presents with   Shortness of Breath     Brief history of present illness from the day of admission and additional interim summary    61 y.o.  female OSA on CPAP/BiPAP, chronic persistent asthma, HLD, OA left knee, morbid obesity-who presented with 2-3-day history of subjective fever, productive cough and shortness of breath.  She was found to have acute hypoxic respiratory failure due to community-acquired pneumonia and subsequently admitted to the hospitalist service.  See below for further details.   Significant events: 7/27>> admit to [facility]-hypoxia-needing BiPAP in the ED-PNA on CT imaging.   Significant studies: 7/27>> CT angio: No PE, multifocal pneumonia-predominantly in the left upper lobe.     Significant microbiology data: 7/27>> COVID PCR: Negative                                                                 Hospital Course   Acute hypoxic respiratory failure due to multifocal PNA: Clinically much improved after receiving IV Rocephin and oral azithromycin, now symptom-free  however still requiring 2 to 3 L nasal cannula oxygen upon exertion, no other symptoms, was slightly dehydrated and blood pressures have improved considerably after hydration with IV fluids.  Again she is symptom-free right now will be discharged home on 4 more days of oral antibiotics, supplemental oxygen with close outpatient PCP follow-up within a week of discharge.  Request PCP to monitor blood pressure, check CBC, CMP and a two-view chest x-ray 7 to 10 days.  Readdress daytime oxygen need.   Minimally elevated transaminases: Likely due to atypical pneumonia trend improving and currently resolved, PCP to recheck in 7 to 10 days.   Chronic persistent asthma: Continue bronchodilators-not in flare.   Morbid obesity with BMI of 46 with OSA: On BiPAP at home.  Continue nighttime care, follow with PCP for weight loss   DM-2 (last A1c 5.7 on 11/24/2020): Continue home regimen of metformin.   Discharge diagnosis     Principal Problem:   Acute respiratory failure with hypoxia (HCC) Active Problems:  Morbid obesity with body mass index (BMI) of 50.0 to 59.9 in adult [facility])   Obesity hypoventilation syndrome (HCC)   CAP (community acquired pneumonia)   Abnormal LFTs    Discharge instructions    Discharge Instructions     Diet - low sodium heart healthy   Complete by: As directed    Discharge instructions   Complete by: As directed    Follow with Primary MD Marcine Matar, MD in 7 days   Get CBC, CMP, 2 view Chest X ray -  checked next visit within 1 week by Primary MD    Activity: As tolerated with Full fall precautions use walker/cane & assistance as needed  Disposition Home    Diet: Heart Health   Special Instructions: If you have smoked or chewed Tobacco  in the last 2 yrs please stop smoking, stop any regular Alcohol  and or any Recreational drug use.  On your next visit with your primary care physician please Get Medicines reviewed and adjusted.  Please request your  Prim.MD to go over all Hospital Tests and Procedure/Radiological results at the follow up, please get all Hospital records sent to your Prim MD by signing hospital release before you go home.  If you experience worsening of your admission symptoms, develop shortness of breath, life threatening emergency, suicidal or homicidal thoughts you must seek medical attention immediately by calling 911 or calling your MD immediately  if symptoms less severe.  You Must read complete instructions/literature along with all the possible adverse reactions/side effects for all the Medicines you take and that have been prescribed to you. Take any new Medicines after you have completely understood and accpet all the possible adverse reactions/side effects.   Increase activity slowly   Complete by: As directed        Discharge Medications   Allergies as of 04/23/2022   No Known Allergies      Medication List     STOP taking these medications    methocarbamol 500 MG tablet Commonly known as: Robaxin   multivitamin with minerals Tabs tablet       TAKE these medications    acetaminophen 500 MG tablet Commonly known as: TYLENOL Take 2 tablets (1,000 mg total) by mouth every 6 (six) hours as needed for mild pain. What changed:  when to take this reasons to take this   albuterol 108 (90 Base) MCG/ACT inhaler Commonly known as: VENTOLIN HFA INHALE 2 PUFFS INTO THE LUNGS EVERY 4 (FOUR) HOURS AS NEEDED FOR WHEEZING OR SHORTNESS OF BREATH. What changed: Another medication with the same name was removed. Continue taking this medication, and follow the directions you see here.   atorvastatin 10 MG tablet Commonly known as: LIPITOR Tome 1 tableta (10 mg en total) por va oral diariamente. (TAKE 1 TABLET (10 MG TOTAL) BY MOUTH DAILY.) What changed: how much to take   azithromycin 500 MG tablet Commonly known as: ZITHROMAX Take 1 tablet (500 mg total) by mouth daily.   CALCIUM PO Take 1 tablet  by mouth daily.   cephALEXin 500 MG capsule Commonly known as: KEFLEX Take 1 capsule (500 mg total) by mouth 3 (three) times daily for 4 days.   diclofenac Sodium 1 % Gel Commonly known as: Voltaren Apply 2 g topically 4 (four) times daily.   furosemide 20 MG tablet Commonly known as: LASIX TAKE 1 TABLET (20 MG TOTAL) BY MOUTH DAILY AS NEEDED FOR LEG SELLING. DO NOT TAKE MORE THAN 1 TABLET PER  DAY What changed:  how much to take how to take this when to take this   gabapentin 300 MG capsule Commonly known as: NEURONTIN Take 1 capsule (300 mg total) by mouth 3 (three) times daily. What changed: when to take this   metFORMIN 500 MG tablet Commonly known as: GLUCOPHAGE TAKE 1/2 TABLET (250 MG TOTAL) BY MOUTH DAILY WITH BREAKFAST. What changed:  how much to take how to take this when to take this   OVER THE COUNTER MEDICATION Place 1 drop into both eyes 2 (two) times daily as needed (itchy eyes). Chamomile eye drops               Durable Medical Equipment  (From admission, onward)           Start     Ordered   04/22/22 0732  For home use only DME oxygen  Once       Question Answer Comment  Length of Need 6 Months   Mode or (Route) Nasal cannula   Liters per Minute 3   Frequency Continuous (stationary and portable oxygen unit needed)   Oxygen conserving device Yes   Oxygen delivery system Gas      04/22/22 0731   04/22/22 0732  For home use only DME Walker rolling  Once       Comments: 5 wheel  Question Answer Comment  Walker: With 5 Inch Wheels   Patient needs a walker to treat with the following condition Weakness      04/22/22 0731             Follow-up Information     Marcine Matar, MD. Schedule an appointment as soon as possible for a visit in 1 week(s).   Specialty: Internal Medicine Contact information: [address] [place] [address] [phone]         Croitoru, Rachelle Hora, MD. Schedule an appointment as soon  as possible for a visit in 1 week(s).   Specialty: Cardiology Contact information: [address] [place] [address] [phone]                 Major procedures and Radiology Reports - PLEASE review detailed and final reports thoroughly  -       DG Chest [facility] 1 View  Result Date: 04/22/2022 CLINICAL DATA:  61 year old female with history of acute respiratory failure with hypoxia. EXAM: PORTABLE CHEST 1 VIEW COMPARISON:  Chest x-ray 03/20/2022. FINDINGS: Lung volumes are low. Diffuse peribronchial cuffing. Patchy ill-defined opacities are noted throughout the mid to lower lungs bilaterally. Blunting of both costophrenic sulci indicative of probable trace bilateral pleural effusions. No pneumothorax. No cephalization of the pulmonary vasculature. Mild cardiomegaly. Upper mediastinal contours are within normal limits allowing for patient rotation to the left. IMPRESSION: 1. The appearance of the chest is most compatible with bronchitis with severe multilobar bilateral bronchopneumonia, better demonstrated on recent chest CT. 2. Possible trace bilateral pleural effusions. 3. Mild cardiomegaly. Electronically Signed   By: Trudie Reed M.D.   On: 04/22/2022 08:02   CT Angio Chest PE W and/or Wo Contrast  Result Date: 04/19/2022 CLINICAL DATA:  Shortness of breath, productive cough EXAM: CT ANGIOGRAPHY CHEST WITH CONTRAST TECHNIQUE: Multidetector CT imaging of the chest was performed using the standard protocol during bolus administration of intravenous contrast. Multiplanar CT image reconstructions and MIPs were obtained to evaluate the vascular anatomy. RADIATION DOSE REDUCTION: This exam was performed according to the departmental dose-optimization program which  includes automated exposure control, adjustment of the mA and/or kV according to patient size and/or use of iterative reconstruction technique. CONTRAST:  81mL OMNIPAQUE IOHEXOL 350 MG/ML SOLN COMPARISON:  Chest  radiograph dated 04/19/2022 FINDINGS: Cardiovascular: Satisfactory opacification of the bilateral pulmonary arteries to the segmental level. No evidence of pulmonary embolism. Although not tailored for evaluation of the thoracic aorta, there is no evidence of thoracic aortic aneurysm or dissection. The heart is top-normal in size.  No pericardial effusion. Mediastinum/Nodes: No suspicious mediastinal lymphadenopathy. Visualized thyroid is unremarkable. Lungs/Pleura: Multifocal patchy/nodular opacities in the left upper lobe, right middle lobe, and bilateral lower lobes, suspicious for multifocal pneumonia, left upper lobe predominant. Mild centrilobular emphysematous changes, upper lung predominant. Mild right lower lobe atelectasis. No pleural effusion or pneumothorax. Upper Abdomen: Visualized upper abdomen is grossly unremarkable. Musculoskeletal: Degenerative changes of the visualized thoracolumbar spine. Review of the MIP images confirms the above findings. IMPRESSION: No evidence of pulmonary embolism. Multifocal pneumonia, left upper lobe predominant. Emphysema (ICD10-J43.9). Electronically Signed   By: Charline Bills M.D.   On: 04/19/2022 23:51   DG Chest 2 View  Result Date: 04/19/2022 CLINICAL DATA:  Productive cough. Shortness of breath. Personal history of congestive heart failure. EXAM: CHEST - 2 VIEW COMPARISON:  One-view chest x-ray 05/21/2020 FINDINGS: The heart is enlarged. Diffuse interstitial pattern is present. Small effusions are suspected. Asymmetric left lower lobe airspace opacity noted. No pneumothorax is present. Multilevel degenerative changes are present in the thoracic spine. IMPRESSION: 1. Cardiomegaly with interstitial edema and small bilateral effusions compatible with congestive heart failure. 2. Asymmetric left lower lobe airspace disease likely reflects atelectasis. Infection is not excluded. Electronically Signed   By: Marin Roberts M.D.   On: 04/19/2022 14:43      Today   Subjective    Veronica Castaneda today has no headache,no chest abdominal pain,no new weakness tingling or numbness, feels much better wants to go home today.     Objective   Blood pressure (!) 141/78, pulse 82, temperature 98.6 F (37 C), temperature source Oral, resp. rate 18, height 5' 0.63" (1.54 m), weight 111.2 kg, SpO2 91 %.   Intake/Output Summary (Last 24 hours) at 04/23/2022 0823 Last data filed at 04/23/2022 0523 Gross per 24 hour  Intake 440 ml  Output 400 ml  Net 40 ml    Exam  Awake Alert, No new F.N deficits,    [place].AT,PERRAL Supple Neck,   Symmetrical Chest wall movement, Good air movement bilaterally, CTAB RRR,No Gallops,   +ve B.Sounds, Abd Soft, Non tender,  No Cyanosis, Clubbing or edema    Data Review   Recent Labs  Lab 04/19/22 1423 04/20/22 0553 04/21/22 0023 04/22/22 0111 04/23/22 0041  WBC 10.9* 8.3 6.6 7.0 6.7  HGB 13.7 13.2 12.3 13.1 12.8  HCT 43.6 42.6 39.7 40.5 39.9  PLT 201 196 217 208 204  MCV 101.9* 102.4* 101.3* 99.8 100.0  MCH 32.0 31.7 31.4 32.3 32.1  MCHC 31.4 31.0 31.0 32.3 32.1  RDW 13.1 13.1 12.9 12.6 12.6  LYMPHSABS 2.2  --   --   --  1.7  MONOABS 1.2*  --   --   --  0.7  EOSABS 0.3  --   --   --  0.3  BASOSABS 0.1  --   --   --  0.1    Recent Labs  Lab 04/19/22 1423 04/20/22 0553 04/21/22 0023 04/23/22 0041  NA 140 141 141 140  K 4.4 3.9 3.9 4.1  CL 104 102 102 103  CO2 30 33* 32 32  GLUCOSE 108* 120* 112* 113*  BUN 6 <5* <5* 10  CREATININE 0.57 0.50 0.59 0.68  CALCIUM 8.7* 8.5* 8.8* 8.9  AST 49* 29  --  15  ALT 58* 47*  --  26  ALKPHOS 126 113  --  114  BILITOT 0.4 0.9  --  0.8  ALBUMIN 2.8* 2.6*  --  2.5*  MG  --   --   --  1.9  BNP  --  48.9  --  25.6    Total Time in preparing paper work, data evaluation and todays exam - 35 minutes  Susa Raring M.D on 04/23/2022 at 8:23 AM  [facility]

## 2022-04-23 NOTE — Telephone Encounter (Signed)
[facility] called to schedule the patient a hospital follow up with one week. Hospital follow up scheduled for 3 weeks please follow up with the patient

## 2022-04-25 ENCOUNTER — Encounter: Payer: Self-pay | Admitting: Cardiovascular Disease

## 2022-04-25 ENCOUNTER — Ambulatory Visit (INDEPENDENT_AMBULATORY_CARE_PROVIDER_SITE_OTHER): Payer: Self-pay | Admitting: Cardiovascular Disease

## 2022-04-25 ENCOUNTER — Other Ambulatory Visit: Payer: Self-pay

## 2022-04-25 VITALS — BP 102/60 | HR 68 | Ht 64.17 in | Wt 248.0 lb

## 2022-04-25 DIAGNOSIS — I5032 Chronic diastolic (congestive) heart failure: Secondary | ICD-10-CM

## 2022-04-25 DIAGNOSIS — G4733 Obstructive sleep apnea (adult) (pediatric): Secondary | ICD-10-CM

## 2022-04-25 DIAGNOSIS — R600 Localized edema: Secondary | ICD-10-CM

## 2022-04-25 DIAGNOSIS — R7303 Prediabetes: Secondary | ICD-10-CM

## 2022-04-25 MED ORDER — FUROSEMIDE 20 MG PO TABS
ORAL_TABLET | ORAL | 6 refills | Status: DC
  Filled 2022-04-25: qty 30, 15d supply, fill #0

## 2022-04-25 NOTE — Progress Notes (Signed)
Cardiology Office Note:    Date:  05/02/2022   ID:  Castaneda, Veronica 1961/04/10, MRN [id number]  PCP:  Marcine Matar, MD   [facility] Providers Cardiologist:  Thurmon Fair, MD     Referring MD: Marcine Matar, MD   Chief Complaint  Patient presents with   Edema    History of Present Illness:    Veronica Castaneda is a 61 y.o. female with a hx of morbid obesity complicated by obstructive sleep apnea and obesity hypoventilation syndrome, chronic diastolic heart failure, hyperlipidemia, prediabetes, recently hospitalized with pneumonia and acute respiratory failure with hypoxia in late July.  She is supposed to use BiPAP whenever she sleeps but has not been doing so.  Sounds like she is only using it 2 or 3 times a week.  Also supposed to use oxygen during the daytime "as needed" but does this only intermittently.  She has substantial lower extremity edema.  She denies problems with palpitations, dizziness or syncope, but tends to run a rather low systolic blood pressure.  She has not had chest pain at rest or with activity.  She denies any focal neurological complaints.  She has fatigue and daytime hypersomnolence.  Echocardiogram on 04/30/2022, after this appointment showed normal left ventricular systolic function.  There are fairly mild signs of diastolic left ventricular dysfunction.  The right ventricle was described as mildly enlarged.  I do not think the estimation of the pulmonary artery pressure is accurate on this last echo.  Past Medical History:  Diagnosis Date   Asthma    CHF (congestive heart failure) (HCC)    Dyspnea    Hyperlipidemia    Pneumonia    Pre-diabetes    Sleep apnea     Past Surgical History:  Procedure Laterality Date   INSERTION OF MESH  06/15/2021   Procedure: INSERTION OF MESH;  Surgeon: Henrene Dodge, MD;  Location: [facility] ORS;  Service: General;;   NO PAST SURGERIES     No PAST SURGICAL HISTORY     XI  ROBOTIC ASSISTED VENTRAL HERNIA N/A 06/15/2021   Procedure: XI ROBOTIC ASSISTED VENTRAL HERNIA;  Surgeon: Henrene Dodge, MD;  Location: [facility] ORS;  Service: General;  Laterality: N/A;    Current Medications: Current Meds  Medication Sig   acetaminophen (TYLENOL) 500 MG tablet Take 2 tablets (1,000 mg total) by mouth every 6 (six) hours as needed for mild pain. (Patient taking differently: Take 1,000 mg by mouth daily as needed for mild pain or fever.)   atorvastatin (LIPITOR) 10 MG tablet TAKE 1 TABLET (10 MG TOTAL) BY MOUTH DAILY. (Patient taking differently: Take 10 mg by mouth daily.)   azithromycin (ZITHROMAX) 500 MG tablet Take 1 tablet (500 mg total) by mouth daily.   CALCIUM PO Take 1 tablet by mouth daily.   [EXPIRED] cephALEXin (KEFLEX) 500 MG capsule Take 1 capsule (500 mg total) by mouth 3 (three) times daily for 4 days.   metFORMIN (GLUCOPHAGE) 500 MG tablet TAKE 1/2 TABLET (250 MG TOTAL) BY MOUTH DAILY WITH BREAKFAST. (Patient taking differently: Take 250 mg by mouth daily with breakfast.)   [DISCONTINUED] furosemide (LASIX) 20 MG tablet TAKE 1 TABLET (20 MG TOTAL) BY MOUTH DAILY AS NEEDED FOR LEG SELLING. DO NOT TAKE MORE THAN 1 TABLET PER DAY (Patient taking differently: Take 20 mg by mouth daily.)     Allergies:   Patient has no known allergies.   Social History   Socioeconomic History   Marital status:  Single    Spouse name: Not on file   Number of children: 3   Years of education: Not on file   Highest education level: 2nd grade  Occupational History   Occupation: house wife  Tobacco Use   Smoking status: Never   Smokeless tobacco: Never  Vaping Use   Vaping Use: Never used  Substance and Sexual Activity   Alcohol use: Never   Drug use: Never   Sexual activity: Not Currently  Other Topics Concern   Not on file  Social History Narrative   Not on file   Social Determinants of Health   Financial Resource Strain: Not on file  Food Insecurity: Not on file   Transportation Needs: Unmet Transportation Needs (12/08/2020)   PRAPARE - Hydrologist (Medical): Yes    Lack of Transportation (Non-Medical): Yes  Physical Activity: Not on file  Stress: Not on file  Social Connections: Not on file     Family History: The patient's family history includes Heart attack in her mother; Heart disease in her mother.  ROS:   Please see the history of present illness.     All other systems reviewed and are negative.  EKGs/Labs/Other Studies Reviewed:    The following studies were reviewed today:   Echo 04/30/2022    1. Left ventricular ejection fraction, by estimation, is 55 to 60%. The  left ventricle has normal function. The left ventricle has no regional  wall motion abnormalities. Left ventricular diastolic parameters are  consistent with Grade I diastolic  dysfunction (impaired relaxation).   2. Right ventricular systolic function is normal. The right ventricular  size is mildly enlarged. There is normal pulmonary artery systolic  pressure. The estimated right ventricular systolic pressure is 99991111 mmHg.   3. The mitral valve is grossly normal. Trivial mitral valve  regurgitation. No evidence of mitral stenosis.   4. The aortic valve is grossly normal. Aortic valve regurgitation is not  visualized. No aortic stenosis is present.   5. The inferior vena cava is normal in size with greater than 50%  respiratory variability, suggesting right atrial pressure of 3 mmHg.   Comparison(s): No significant change from prior study.  EKG:  EKG is ordered today.  The ekg ordered today demonstrates sinus rhythm with a single PVC, generalized low voltage due to obesity, no repolarization abnormalities, QTc 427 ms  Recent Labs: 04/23/2022: ALT 26; B Natriuretic Peptide 25.6; BUN 10; Creatinine, Ser 0.68; Hemoglobin 12.8; Magnesium 1.9; Platelets 204; Potassium 4.1; Sodium 140  Recent Lipid Panel    Component Value Date/Time    CHOL 276 (H) 11/24/2020 1102   TRIG 298 (H) 11/24/2020 1102   HDL 41 11/24/2020 1102   CHOLHDL 6.7 (H) 11/24/2020 1102   LDLCALC 177 (H) 11/24/2020 1102     Risk Assessment/Calculations:           Physical Exam:    VS:  BP 102/60 (BP Location: Left Arm, Patient Position: Sitting, Cuff Size: Large)   Pulse 68   Ht 5' 4.17" (1.63 m)   Wt 248 lb (112.5 kg)   SpO2 96%   BMI 42.34 kg/m     Wt Readings from Last 3 Encounters:  04/25/22 248 lb (112.5 kg)  04/20/22 245 lb 2.4 oz (111.2 kg)  03/30/22 249 lb (112.9 kg)     GEN: Morbidly obese well nourished, well developed in no acute distress HEENT: Normal NECK: No JVD; No carotid bruits LYMPHATICS: No lymphadenopathy CARDIAC: RRR,  no murmurs, rubs, gallops RESPIRATORY:  Clear to auscultation without rales, wheezing or rhonchi  ABDOMEN: Soft, non-tender, non-distended MUSCULOSKELETAL: Symmetrical 2-3+ lower extremity edema; No deformity  SKIN: Warm and dry NEUROLOGIC:  Alert and oriented x 3 PSYCHIATRIC:  Normal affect   ASSESSMENT:    1. Chronic diastolic heart failure (HCC)   2. Pedal edema   3. OSA (obstructive sleep apnea)   4. Prediabetes   5. Morbid obesity (HCC)    PLAN:    In order of problems listed above:  Right heart failure: I do not think she has intrinsic cardiac disease.  She has right heart failure due to insufficiently treated obstructive sleep apnea and obesity hypoventilation syndrome.  Focus of therapy is to improve respiratory status and prevent hypoxemia.  Discussed this in detail with the patient and her family today.  She should wear CPAP/BiPAP whenever she is asleep, both at nighttime or if she naps during the day.  Would probably benefit from wearing oxygen most of the time.  Weight loss would be highly beneficial.  Will increase the diuretic dose as needed to help with the edema. Obesity hypoventilation syndrome/OSA: Discussed the pathophysiology in detail and why this condition leads to heart  failure.  Referred to sleep HLP: Checked in March 2022 showed an LDL cholesterol of 177.  She is currently on atorvastatin, I do not think she has had her repeat lipid profile. DM: Most recent hemoglobin A1c was from more than a year ago but was almost normal range at 5.7%. Morbid obesity           Medication Adjustments/Labs and Tests Ordered: Current medicines are reviewed at length with the patient today.  Concerns regarding medicines are outlined above.  Orders Placed This Encounter  Procedures   Ambulatory referral to Cardiology   EKG 12-Lead   ECHOCARDIOGRAM COMPLETE   Meds ordered this encounter  Medications   furosemide (LASIX) 20 MG tablet    Sig: Take 1 to 2 tablets (20-40 mg) by mouth once daily as needed for swelling.    Dispense:  30 tablet    Refill:  6    Patient Instructions  Medication Instructions:  TAKE Furosemide 20-40 mg once daily as needed for swelling  *If you need a refill on your cardiac medications before your next appointment, please call your pharmacy*   Lab Work: None ordered If you have labs (blood work) drawn today and your tests are completely normal, you will receive your results only by: MyChart Message (if you have MyChart) OR A paper copy in the mail If you have any lab test that is abnormal or we need to change your treatment, we will call you to review the results.   Testing/Procedures: Your physician has requested that you have an echocardiogram. Echocardiography is a painless test that uses sound waves to create images of your heart. It provides your doctor with information about the size and shape of your heart and how well your heart's chambers and valves are working. You may receive an ultrasound enhancing agent through an IV if needed to better visualize your heart during the echo.This procedure takes approximately one hour. There are no restrictions for this procedure. This will take place at the [address], [address].    Follow-Up: At [facility], you and your health needs are our priority.  As part of our continuing mission to provide you with exceptional heart care, we have created designated Provider Care Teams.  These Care Teams  include your primary Cardiologist (physician) and Advanced Practice Providers (APPs -  Physician Assistants and Nurse Practitioners) who all work together to provide you with the care you need, when you need it.  We recommend signing up for the patient portal called "MyChart".  Sign up information is provided on this After Visit Summary.  MyChart is used to connect with patients for Virtual Visits (Telemedicine).  Patients are able to view lab/test results, encounter notes, upcoming appointments, etc.  Non-urgent messages can be sent to your provider as well.   To learn more about what you can do with MyChart, go to [email or web address].    Your next appointment:   12 month(s)  The format for your next appointment:   In Person  Provider:   Thurmon Fair, MD {  First available with Dr. Tresa Endo sleep clinic  Important Information About Sugar         Signed, Thurmon Fair, MD  05/02/2022 8:06 AM    [facility]

## 2022-04-25 NOTE — Patient Instructions (Signed)
Medication Instructions:  TAKE Furosemide 20-40 mg once daily as needed for swelling  *If you need a refill on your cardiac medications before your next appointment, please call your pharmacy*   Lab Work: None ordered If you have labs (blood work) drawn today and your tests are completely normal, you will receive your results only by: MyChart Message (if you have MyChart) OR A paper copy in the mail If you have any lab test that is abnormal or we need to change your treatment, we will call you to review the results.   Testing/Procedures: Your physician has requested that you have an echocardiogram. Echocardiography is a painless test that uses sound waves to create images of your heart. It provides your doctor with information about the size and shape of your heart and how well your heart's chambers and valves are working. You may receive an ultrasound enhancing agent through an IV if needed to better visualize your heart during the echo.This procedure takes approximately one hour. There are no restrictions for this procedure. This will take place at the [address], [address].   Follow-Up: At [facility], you and your health needs are our priority.  As part of our continuing mission to provide you with exceptional heart care, we have created designated Provider Care Teams.  These Care Teams include your primary Cardiologist (physician) and Advanced Practice Providers (APPs -  Physician Assistants and Nurse Practitioners) who all work together to provide you with the care you need, when you need it.  We recommend signing up for the patient portal called "MyChart".  Sign up information is provided on this After Visit Summary.  MyChart is used to connect with patients for Virtual Visits (Telemedicine).  Patients are able to view lab/test results, encounter notes, upcoming appointments, etc.  Non-urgent messages can be sent to your provider as well.   To learn more about what you can do  with MyChart, go to [email or web address].    Your next appointment:   12 month(s)  The format for your next appointment:   In Person  Provider:   Thurmon Fair, MD {  First available with Dr. Tresa Endo sleep clinic  Important Information About Sugar

## 2022-04-30 ENCOUNTER — Ambulatory Visit (HOSPITAL_COMMUNITY): Payer: Self-pay | Attending: Cardiovascular Disease

## 2022-04-30 DIAGNOSIS — I5032 Chronic diastolic (congestive) heart failure: Secondary | ICD-10-CM | POA: Insufficient documentation

## 2022-04-30 LAB — ECHOCARDIOGRAM COMPLETE
Area-P 1/2: 3.65 cm2
S' Lateral: 3.3 cm

## 2022-05-01 ENCOUNTER — Other Ambulatory Visit: Payer: Self-pay

## 2022-05-02 ENCOUNTER — Encounter: Payer: Self-pay | Admitting: Cardiovascular Disease

## 2022-05-08 ENCOUNTER — Encounter: Payer: Self-pay | Admitting: *Deleted

## 2022-05-08 ENCOUNTER — Ambulatory Visit: Payer: Self-pay

## 2022-05-08 DIAGNOSIS — Z1211 Encounter for screening for malignant neoplasm of colon: Secondary | ICD-10-CM

## 2022-05-18 ENCOUNTER — Other Ambulatory Visit: Payer: Self-pay

## 2022-05-18 ENCOUNTER — Ambulatory Visit: Payer: Self-pay | Attending: Internal Medicine | Admitting: Internal Medicine

## 2022-05-18 ENCOUNTER — Other Ambulatory Visit: Payer: Self-pay | Admitting: Pharmacist

## 2022-05-18 VITALS — BP 113/67 | HR 66 | Ht 60.0 in | Wt 242.2 lb

## 2022-05-18 DIAGNOSIS — J9611 Chronic respiratory failure with hypoxia: Secondary | ICD-10-CM

## 2022-05-18 DIAGNOSIS — Z9981 Dependence on supplemental oxygen: Secondary | ICD-10-CM

## 2022-05-18 DIAGNOSIS — I50812 Chronic right heart failure: Secondary | ICD-10-CM | POA: Insufficient documentation

## 2022-05-18 DIAGNOSIS — J189 Pneumonia, unspecified organism: Secondary | ICD-10-CM

## 2022-05-18 DIAGNOSIS — J4530 Mild persistent asthma, uncomplicated: Secondary | ICD-10-CM

## 2022-05-18 DIAGNOSIS — Z09 Encounter for follow-up examination after completed treatment for conditions other than malignant neoplasm: Secondary | ICD-10-CM

## 2022-05-18 DIAGNOSIS — E662 Morbid (severe) obesity with alveolar hypoventilation: Secondary | ICD-10-CM

## 2022-05-18 DIAGNOSIS — Z23 Encounter for immunization: Secondary | ICD-10-CM

## 2022-05-18 MED ORDER — BUDESONIDE-FORMOTEROL FUMARATE 80-4.5 MCG/ACT IN AERO
2.0000 | INHALATION_SPRAY | Freq: Two times a day (BID) | RESPIRATORY_TRACT | 3 refills | Status: DC
  Filled 2022-05-18: qty 1, fill #0

## 2022-05-18 MED ORDER — FLUTICASONE FUROATE-VILANTEROL 100-25 MCG/ACT IN AEPB
1.0000 | INHALATION_SPRAY | Freq: Every day | RESPIRATORY_TRACT | 2 refills | Status: DC
  Filled 2022-05-18: qty 60, 60d supply, fill #0
  Filled 2022-05-18: qty 60, 30d supply, fill #0

## 2022-05-18 NOTE — Progress Notes (Signed)
Patient ID: Veronica Castaneda, female    DOB: 07-May-1961  MRN: [id number]  CC: Hospitalization Follow-up   Subjective: Veronica Castaneda is a 61 y.o. female who presents for hosp f/u Her concerns today include:  Pt with preDM/morbid obesity, OA LT knee, mixed HL, persistent asthma, hypoventilation syndrome with likely OSA on BiPAP.   Patient hospitalized 7/27-31/2023 with hypoxic respiratory failure secondary to bilateral CAP on top of obesity hypoventilation syndrome/OSA.  Treated with antibiotics and had improved but required O2 2 to 3 L as needed on discharge. Today: Patient did not bring medications with her. She has completed a course of antibiotics.  Feeling better. She does not have oxygen with her.  She tells me that she uses it 1 to 2 hours during the day and and connects it to her BiPAP at nights.  She is not sure of the O2 level that she uses.  She did receive a new BiPAP machine since I last saw her.  She is sleeping with it every night.  States she feels more refreshed in the mornings and less daytime sleepiness.  She never did get a formal sleep study done.  We had tried in the past when she did have the orange card/cone discount but she had missed the appointment a few times for various reasons. -Reports using albuterol inhaler twice a day.  Saw cardiology Dr. Sandria Manly earlier this month.  Assessed to have mild right heart failure secondary to OSA and OHS.  Patient Active Problem List   Diagnosis Date Noted   CAP (community acquired pneumonia) 04/20/2022   Abnormal LFTs 04/20/2022   Ventral hernia without obstruction or gangrene    Umbilical hernia without obstruction and without gangrene    Hyperventilation syndrome 11/24/2020   Asthma exacerbation 05/22/2020   Morbid obesity with body mass index (BMI) of 50.0 to 59.9 in adult (HCC) 05/22/2020   Obesity hypoventilation syndrome (HCC) 05/22/2020   OSA (obstructive sleep apnea) 05/22/2020   RSV  (respiratory syncytial virus infection) 05/22/2020   Difficult airway for intubation 05/22/2020   Acute respiratory failure with hypoxia (HCC) 05/12/2020   Loud snoring 02/11/2020   Daytime sleepiness 02/11/2020   Screening breast examination 10/22/2019   Prediabetes 08/18/2019   Hyperlipidemia 08/18/2019   Morbid obesity (HCC) 08/17/2019     Current Outpatient Medications on File Prior to Visit  Medication Sig Dispense Refill   acetaminophen (TYLENOL) 500 MG tablet Take 2 tablets (1,000 mg total) by mouth every 6 (six) hours as needed for mild pain. (Patient taking differently: Take 1,000 mg by mouth daily as needed for mild pain or fever.)     albuterol (VENTOLIN HFA) 108 (90 Base) MCG/ACT inhaler INHALE 2 PUFFS INTO THE LUNGS EVERY 4 (FOUR) HOURS AS NEEDED FOR WHEEZING OR SHORTNESS OF BREATH. 18 g 1   atorvastatin (LIPITOR) 10 MG tablet TAKE 1 TABLET (10 MG TOTAL) BY MOUTH DAILY. (Patient taking differently: Take 10 mg by mouth daily.) 30 tablet 6   azithromycin (ZITHROMAX) 500 MG tablet Take 1 tablet (500 mg total) by mouth daily. 4 tablet 0   CALCIUM PO Take 1 tablet by mouth daily.     diclofenac Sodium (VOLTAREN) 1 % GEL Apply 2 g topically 4 (four) times daily. 100 g 1   furosemide (LASIX) 20 MG tablet Take 1 to 2 tablets (20-40 mg) by mouth once daily as needed for swelling. 30 tablet 6   gabapentin (NEURONTIN) 300 MG capsule Take 1 capsule (300 mg total) by  mouth 3 (three) times daily. 90 capsule 3   metFORMIN (GLUCOPHAGE) 500 MG tablet TAKE 1/2 TABLET (250 MG TOTAL) BY MOUTH DAILY WITH BREAKFAST. (Patient taking differently: Take 250 mg by mouth daily with breakfast.) 30 tablet 6   OVER THE COUNTER MEDICATION Place 1 drop into both eyes 2 (two) times daily as needed (itchy eyes). Chamomile eye drops     No current facility-administered medications on file prior to visit.    No Known Allergies  Social History   Socioeconomic History   Marital status: Single    Spouse name:  Not on file   Number of children: 3   Years of education: Not on file   Highest education level: 2nd grade  Occupational History   Occupation: house wife  Tobacco Use   Smoking status: Never   Smokeless tobacco: Never  Vaping Use   Vaping Use: Never used  Substance and Sexual Activity   Alcohol use: Never   Drug use: Never   Sexual activity: Not Currently  Other Topics Concern   Not on file  Social History Narrative   Not on file   Social Determinants of Health   Financial Resource Strain: Not on file  Food Insecurity: Not on file  Transportation Needs: Unmet Transportation Needs (12/08/2020)   PRAPARE - Administrator, Civil Service (Medical): Yes    Lack of Transportation (Non-Medical): Yes  Physical Activity: Not on file  Stress: Not on file  Social Connections: Not on file  Intimate Partner Violence: Not on file    Family History  Problem Relation Age of Onset   Heart attack Mother    Heart disease Mother     Past Surgical History:  Procedure Laterality Date   INSERTION OF MESH  06/15/2021   Procedure: INSERTION OF MESH;  Surgeon: Henrene Dodge, MD;  Location: [facility] ORS;  Service: General;;   NO PAST SURGERIES     No PAST SURGICAL HISTORY     XI ROBOTIC ASSISTED VENTRAL HERNIA N/A 06/15/2021   Procedure: XI ROBOTIC ASSISTED VENTRAL HERNIA;  Surgeon: Henrene Dodge, MD;  Location: [facility] ORS;  Service: General;  Laterality: N/A;    ROS: Review of Systems Negative except as stated above  PHYSICAL EXAM: BP 113/67   Pulse 66   Ht 5' (1.524 m)   Wt 242 lb 3.2 oz (109.9 kg)   SpO2 92%   BMI 47.30 kg/m   / Wt Readings from Last 3 Encounters:  05/18/22 242 lb 3.2 oz (109.9 kg)  04/25/22 248 lb (112.5 kg)  04/20/22 245 lb 2.4 oz (111.2 kg)  Pulse ox at rest on room air 92%. Pulse ox with ambulation on room air 90 to 92%  Physical Exam  General appearance -morbidly obese middle-age Hispanic female in NAD. Mental status -patient is alert.  She is  not falling asleep as she was on last visit.  She answers questions appropriately. Chest - clear to auscultation, no wheezes, rales or rhonchi, symmetric air entry Heart - normal rate, regular rhythm, normal S1, S2, no murmurs, rubs, clicks or gallops Extremities -trace lower extremity edema      Latest Ref Rng & Units 04/23/2022   12:41 AM 04/21/2022   12:23 AM 04/20/2022    5:53 AM  CMP  Glucose 70 - 99 mg/dL 161  096  045   BUN 6 - 20 mg/dL 10  <5  <5   Creatinine 0.44 - 1.00 mg/dL 4.09  8.11  9.14   Sodium  135 - 145 mmol/L 140  141  141   Potassium 3.5 - 5.1 mmol/L 4.1  3.9  3.9   Chloride 98 - 111 mmol/L 103  102  102   CO2 22 - 32 mmol/L 32  32  33   Calcium 8.9 - 10.3 mg/dL 8.9  8.8  8.5   Total Protein 6.5 - 8.1 g/dL 6.4   7.0   Total Bilirubin 0.3 - 1.2 mg/dL 0.8   0.9   Alkaline Phos 38 - 126 U/L 114   113   AST 15 - 41 U/L 15   29   ALT 0 - 44 U/L 26   47    Lipid Panel     Component Value Date/Time   CHOL 276 (H) 11/24/2020 1102   TRIG 298 (H) 11/24/2020 1102   HDL 41 11/24/2020 1102   CHOLHDL 6.7 (H) 11/24/2020 1102   LDLCALC 177 (H) 11/24/2020 1102    CBC    Component Value Date/Time   WBC 6.7 04/23/2022 0041   RBC 3.99 04/23/2022 0041   HGB 12.8 04/23/2022 0041   HGB 14.8 11/24/2020 1102   HCT 39.9 04/23/2022 0041   HCT 44.8 11/24/2020 1102   PLT 204 04/23/2022 0041   PLT 221 11/24/2020 1102   MCV 100.0 04/23/2022 0041   MCV 96 11/24/2020 1102   MCH 32.1 04/23/2022 0041   MCHC 32.1 04/23/2022 0041   RDW 12.6 04/23/2022 0041   RDW 12.1 11/24/2020 1102   LYMPHSABS 1.7 04/23/2022 0041   MONOABS 0.7 04/23/2022 0041   EOSABS 0.3 04/23/2022 0041   BASOSABS 0.1 04/23/2022 0041    ASSESSMENT AND PLAN: 1. Hospital discharge follow-up   2. Community acquired pneumonia, unspecified laterality Doing better.  We can plan to recheck her x-ray on subsequent visit.  3. Chronic respiratory failure with hypoxia, on home oxygen therapy (HCC) Currently O2  level is staying above 90 with ambulation.  However I will hold off on having O2 removed from the home as she likely needs it at nights.  We can recheck her oxygen level again on her next visit with me in 2 months.  4. Obesity hypoventilation syndrome (HCC) Encouraged her to use the BiPAP consistently.  She seems to be benefiting from this. Encourage patient to reapply for the orange card/cone discount card.  Once approved we will try to refer her again for formal sleep study.  5. Asthma in adult, mild persistent, uncomplicated Add Symbicort inhaler.  Advised patient that it would decrease inflammation in the airway and in doing so she would not have to use her rescue inhaler albuterol as often.  6. Chronic right-sided heart failure (HCC) Thought to be secondary to chronic hypoxia from OSA and obesity hypoventilation syndrome.  7. Need for influenza vaccination Given today   [employer] Language interpreter used during this encounter. #[id number], Cindy  Patient was given the opportunity to ask questions.  Patient verbalized understanding of the plan and was able to repeat key elements of the plan.   This documentation was completed using Paediatric nurse.  Any transcriptional errors are unintentional.  No orders of the defined types were placed in this encounter.    Requested Prescriptions    No prescriptions requested or ordered in this encounter    No follow-ups on file.  Jonah Blue, MD, FACP

## 2022-05-23 ENCOUNTER — Other Ambulatory Visit: Payer: Self-pay

## 2022-05-29 ENCOUNTER — Other Ambulatory Visit: Payer: Self-pay

## 2022-06-04 ENCOUNTER — Other Ambulatory Visit: Payer: Self-pay

## 2022-06-06 ENCOUNTER — Other Ambulatory Visit: Payer: Self-pay

## 2022-06-12 ENCOUNTER — Other Ambulatory Visit: Payer: Self-pay

## 2022-07-03 ENCOUNTER — Other Ambulatory Visit: Payer: Self-pay

## 2022-07-05 ENCOUNTER — Ambulatory Visit: Payer: Self-pay | Admitting: Cardiovascular Disease

## 2022-07-20 ENCOUNTER — Other Ambulatory Visit: Payer: Self-pay

## 2022-07-20 ENCOUNTER — Ambulatory Visit: Payer: Self-pay | Attending: Internal Medicine | Admitting: Internal Medicine

## 2022-07-20 ENCOUNTER — Encounter: Payer: Self-pay | Admitting: Internal Medicine

## 2022-07-20 VITALS — BP 119/69 | HR 48 | Wt 246.8 lb

## 2022-07-20 DIAGNOSIS — G8929 Other chronic pain: Secondary | ICD-10-CM

## 2022-07-20 DIAGNOSIS — R7303 Prediabetes: Secondary | ICD-10-CM

## 2022-07-20 DIAGNOSIS — M546 Pain in thoracic spine: Secondary | ICD-10-CM

## 2022-07-20 DIAGNOSIS — Z1231 Encounter for screening mammogram for malignant neoplasm of breast: Secondary | ICD-10-CM

## 2022-07-20 DIAGNOSIS — Z6841 Body Mass Index (BMI) 40.0 and over, adult: Secondary | ICD-10-CM

## 2022-07-20 DIAGNOSIS — J4530 Mild persistent asthma, uncomplicated: Secondary | ICD-10-CM

## 2022-07-20 DIAGNOSIS — E662 Morbid (severe) obesity with alveolar hypoventilation: Secondary | ICD-10-CM

## 2022-07-20 DIAGNOSIS — J9611 Chronic respiratory failure with hypoxia: Secondary | ICD-10-CM

## 2022-07-20 DIAGNOSIS — Z23 Encounter for immunization: Secondary | ICD-10-CM

## 2022-07-20 DIAGNOSIS — E782 Mixed hyperlipidemia: Secondary | ICD-10-CM

## 2022-07-20 DIAGNOSIS — Z9981 Dependence on supplemental oxygen: Secondary | ICD-10-CM

## 2022-07-20 DIAGNOSIS — M25562 Pain in left knee: Secondary | ICD-10-CM

## 2022-07-20 DIAGNOSIS — Z1211 Encounter for screening for malignant neoplasm of colon: Secondary | ICD-10-CM

## 2022-07-20 LAB — POCT GLYCOSYLATED HEMOGLOBIN (HGB A1C): HbA1c, POC (controlled diabetic range): 5.8 % (ref 0.0–7.0)

## 2022-07-20 LAB — GLUCOSE, POCT (MANUAL RESULT ENTRY): POC Glucose: 135 mg/dl — AB (ref 70–99)

## 2022-07-20 MED ORDER — ZOSTER VAC RECOMB ADJUVANTED 50 MCG/0.5ML IM SUSR: 0.5000 mL | Freq: Once | INTRAMUSCULAR | 0 refills | Status: AC

## 2022-07-20 MED ORDER — METFORMIN HCL 500 MG PO TABS
ORAL_TABLET | ORAL | 2 refills | Status: DC
  Filled 2022-07-20: qty 45, 90d supply, fill #0

## 2022-07-20 MED ORDER — ATORVASTATIN CALCIUM 10 MG PO TABS
ORAL_TABLET | Freq: Every day | ORAL | 2 refills | Status: DC
  Filled 2022-07-20: qty 90, 90d supply, fill #0

## 2022-07-20 MED ORDER — FLUTICASONE FUROATE-VILANTEROL 100-25 MCG/ACT IN AEPB
1.0000 | INHALATION_SPRAY | Freq: Every day | RESPIRATORY_TRACT | 6 refills | Status: DC
  Filled 2022-07-20: qty 60, 30d supply, fill #0

## 2022-07-20 MED ORDER — MELOXICAM 15 MG PO TABS
15.0000 mg | ORAL_TABLET | Freq: Every day | ORAL | 2 refills | Status: DC
  Filled 2022-07-20: qty 30, 30d supply, fill #0

## 2022-07-20 NOTE — Progress Notes (Signed)
Patient ID: Veronica Castaneda, female    DOB: 16-Nov-1960  MRN: [id number]  CC: Medication Refill and Back Pain   Subjective: Veronica Castaneda is a 61 y.o. female who presents for chronic ds management Her concerns today include:  Pt with preDM/morbid obesity, OA LT knee, mixed HL, persistent asthma, hypoventilation syndrome with likely OSA on BiPAP.    C/o pain mid back.  She points to area b/w shoulder blades lower portion. Intermittent.  Started after she was discgh from hosp 03/2022 for BL CAP Lasting 2hrs-full day when it comes on.  Feels "tired and sore." Worse when she is carrying things over shoulder like her hand bag and when she walks.  Better with laying down Does not take anything for pain  Also c/o pain in LT knee.  No swelling. Pain intermittent.  Hurts with ambulation.  No falls.  Obesity/PreDM Results for orders placed or performed in visit on 07/20/22  POCT glucose (manual entry)  Result Value Ref Range   POC Glucose 135 (A) 70 - 99 mg/dl  POCT glycosylated hemoglobin (Hb A1C)  Result Value Ref Range   Hemoglobin A1C     HbA1c POC (<> result, manual entry)     HbA1c, POC (prediabetic range)     HbA1c, POC (controlled diabetic range) 5.8 0.0 - 7.0 %  -Suppose to be on Metformin but looks like the last time it was filled was 03/21/2022.  She has empty bottle with her today requesting refill. wghs up 4 lbs since last visit Drinks water, coffee.  No sodas or juice. Trying to eat smaller portions  Walks 15 mins 2-3 times a wk    Asthma:  prescribed Symbicort on last visit.  Changed to [facility].  Found it helpful Using her BiPAP at nights with her O2 Has Lasix but takes PRN when legs swell  HL: Needs refill on atorvastatin.  Patient Active Problem List   Diagnosis Date Noted   Chronic right-sided heart failure (HCC) 05/18/2022   Asthma in adult, mild persistent, uncomplicated 05/18/2022   Chronic respiratory failure with hypoxia, on home oxygen  therapy (HCC) 05/18/2022   CAP (community acquired pneumonia) 04/20/2022   Abnormal LFTs 04/20/2022   Ventral hernia without obstruction or gangrene    Umbilical hernia without obstruction and without gangrene    Hyperventilation syndrome 11/24/2020   Asthma exacerbation 05/22/2020   Morbid obesity with body mass index (BMI) of 50.0 to 59.9 in adult (HCC) 05/22/2020   Obesity hypoventilation syndrome (HCC) 05/22/2020   OSA (obstructive sleep apnea) 05/22/2020   RSV (respiratory syncytial virus infection) 05/22/2020   Difficult airway for intubation 05/22/2020   Acute respiratory failure with hypoxia (HCC) 05/12/2020   Loud snoring 02/11/2020   Daytime sleepiness 02/11/2020   Screening breast examination 10/22/2019   Prediabetes 08/18/2019   Hyperlipidemia 08/18/2019   Morbid obesity (HCC) 08/17/2019     Current Outpatient Medications on File Prior to Visit  Medication Sig Dispense Refill   albuterol (VENTOLIN HFA) 108 (90 Base) MCG/ACT inhaler INHALE 2 PUFFS INTO THE LUNGS EVERY 4 (FOUR) HOURS AS NEEDED FOR WHEEZING OR SHORTNESS OF BREATH. 18 g 1   atorvastatin (LIPITOR) 10 MG tablet TAKE 1 TABLET (10 MG TOTAL) BY MOUTH DAILY. (Patient taking differently: Take 10 mg by mouth daily.) 30 tablet 6   CALCIUM PO Take 1 tablet by mouth daily.     diclofenac Sodium (VOLTAREN) 1 % GEL Apply 2 g topically 4 (four) times daily. 100 g 1  fluticasone furoate-vilanterol (BREO ELLIPTA) 100-25 MCG/ACT AEPB Inhale 1 puff into the lungs daily. 60 each 2   furosemide (LASIX) 20 MG tablet Take 1 to 2 tablets (20-40 mg) by mouth once daily as needed for swelling. 30 tablet 6   gabapentin (NEURONTIN) 300 MG capsule Take 1 capsule (300 mg total) by mouth 3 (three) times daily. 90 capsule 3   metFORMIN (GLUCOPHAGE) 500 MG tablet TAKE 1/2 TABLET (250 MG TOTAL) BY MOUTH DAILY WITH BREAKFAST. (Patient taking differently: Take 250 mg by mouth daily with breakfast.) 30 tablet 6   OVER THE COUNTER MEDICATION  Place 1 drop into both eyes 2 (two) times daily as needed (itchy eyes). Chamomile eye drops     acetaminophen (TYLENOL) 500 MG tablet Take 2 tablets (1,000 mg total) by mouth every 6 (six) hours as needed for mild pain. (Patient not taking: Reported on 07/20/2022)     No current facility-administered medications on file prior to visit.    No Known Allergies  Social History   Socioeconomic History   Marital status: Single    Spouse name: Not on file   Number of children: 3   Years of education: Not on file   Highest education level: 2nd grade  Occupational History   Occupation: house wife  Tobacco Use   Smoking status: Never   Smokeless tobacco: Never  Vaping Use   Vaping Use: Never used  Substance and Sexual Activity   Alcohol use: Never   Drug use: Never   Sexual activity: Not Currently  Other Topics Concern   Not on file  Social History Narrative   Not on file   Social Determinants of Health   Financial Resource Strain: Not on file  Food Insecurity: Not on file  Transportation Needs: Unmet Transportation Needs (12/08/2020)   PRAPARE - Administrator, Civil Service (Medical): Yes    Lack of Transportation (Non-Medical): Yes  Physical Activity: Not on file  Stress: Not on file  Social Connections: Not on file  Intimate Partner Violence: Not on file    Family History  Problem Relation Age of Onset   Heart attack Mother    Heart disease Mother     Past Surgical History:  Procedure Laterality Date   INSERTION OF MESH  06/15/2021   Procedure: INSERTION OF MESH;  Surgeon: Henrene Dodge, MD;  Location: [facility] ORS;  Service: General;;   NO PAST SURGERIES     No PAST SURGICAL HISTORY     XI ROBOTIC ASSISTED VENTRAL HERNIA N/A 06/15/2021   Procedure: XI ROBOTIC ASSISTED VENTRAL HERNIA;  Surgeon: Henrene Dodge, MD;  Location: [facility] ORS;  Service: General;  Laterality: N/A;    ROS: Review of Systems Negative except as stated above  PHYSICAL EXAM: BP 119/69    Pulse (!) 48   Wt 246 lb 12.8 oz (111.9 kg)   SpO2 91%   BMI 48.20 kg/m   Wt Readings from Last 3 Encounters:  07/20/22 246 lb 12.8 oz (111.9 kg)  05/18/22 242 lb 3.2 oz (109.9 kg)  04/25/22 248 lb (112.5 kg)  Pulse ox 91% room air sitting.  Pulse ox 95% at the start of ambulation.  After making a lap around the nurses station, pulse ox had dropped to 90% on room air.  Physical Exam  General appearance - alert, well appearing, morbidly obese middle-age Hispanic female and in no distress Mental status - normal mood, behavior, speech, dress, motor activity, and thought processes Chest - clear to auscultation,  no wheezes, rales or rhonchi, symmetric air entry Heart - normal rate, regular rhythm, normal S1, S2, no murmurs, rubs, clicks or gallops Musculoskeletal -left knee: Moderate joint enlargement.  No point tenderness.  She has moderate crepitus on passive range of motion.  Right knee: Large body habitus.  No crepitus on passive range of motion. Thoracic spine: No tenderness on palpation of the thoracic spine or surrounding paraspinal muscles. Extremities -no lower extremity edema.      Latest Ref Rng & Units 04/23/2022   12:41 AM 04/21/2022   12:23 AM 04/20/2022    5:53 AM  CMP  Glucose 70 - 99 mg/dL 113  112  120   BUN 6 - 20 mg/dL 10  <5  <5   Creatinine 0.44 - 1.00 mg/dL 0.68  0.59  0.50   Sodium 135 - 145 mmol/L 140  141  141   Potassium 3.5 - 5.1 mmol/L 4.1  3.9  3.9   Chloride 98 - 111 mmol/L 103  102  102   CO2 22 - 32 mmol/L 32  32  33   Calcium 8.9 - 10.3 mg/dL 8.9  8.8  8.5   Total Protein 6.5 - 8.1 g/dL 6.4   7.0   Total Bilirubin 0.3 - 1.2 mg/dL 0.8   0.9   Alkaline Phos 38 - 126 U/L 114   113   AST 15 - 41 U/L 15   29   ALT 0 - 44 U/L 26   47    Lipid Panel     Component Value Date/Time   CHOL 276 (H) 11/24/2020 1102   TRIG 298 (H) 11/24/2020 1102   HDL 41 11/24/2020 1102   CHOLHDL 6.7 (H) 11/24/2020 1102   LDLCALC 177 (H) 11/24/2020 1102    CBC     Component Value Date/Time   WBC 6.7 04/23/2022 0041   RBC 3.99 04/23/2022 0041   HGB 12.8 04/23/2022 0041   HGB 14.8 11/24/2020 1102   HCT 39.9 04/23/2022 0041   HCT 44.8 11/24/2020 1102   PLT 204 04/23/2022 0041   PLT 221 11/24/2020 1102   MCV 100.0 04/23/2022 0041   MCV 96 11/24/2020 1102   MCH 32.1 04/23/2022 0041   MCHC 32.1 04/23/2022 0041   RDW 12.6 04/23/2022 0041   RDW 12.1 11/24/2020 1102   LYMPHSABS 1.7 04/23/2022 0041   MONOABS 0.7 04/23/2022 0041   EOSABS 0.3 04/23/2022 0041   BASOSABS 0.1 04/23/2022 0041    ASSESSMENT AND PLAN:  1. Chronic midline thoracic back pain Likely mild arthritis versus degenerative disc. - meloxicam (MOBIC) 15 MG tablet; Take 1 tablet (15 mg total) by mouth daily.  Dispense: 30 tablet; Refill: 2  2. Chronic pain of left knee Osteoarthritis by exam.  Discussed the importance of weight loss. - meloxicam (MOBIC) 15 MG tablet; Take 1 tablet (15 mg total) by mouth daily.  Dispense: 30 tablet; Refill: 2  3. Morbid obesity with BMI of 45.0-49.9, adult ([place]) Commended her on trying to eat healthy. Patient advised to eliminate sugary drinks from the diet, cut back on portion sizes especially of white carbohydrates, eat more white lean meat like chicken [place] and seafood instead of beef or pork and incorporate fresh fruits and vegetables into the diet daily. Encouraged her to increase exercise to 3 to 5 days a week for 15 minutes.  4. Prediabetes See #2 above - POCT glucose (manual entry) - POCT glycosylated hemoglobin (Hb A1C) - metFORMIN (GLUCOPHAGE) 500 MG tablet; TAKE 1/2  TABLET (250 MG TOTAL) BY MOUTH DAILY WITH BREAKFAST.  Dispense: 45 tablet; Refill: 2  5. Obesity hypoventilation syndrome (HCC) She will continue BiPAP and nocturnal O2  6. Chronic respiratory failure with hypoxia, on home oxygen therapy (HCC) See #5 above  7. Asthma in adult, mild persistent, uncomplicated Stable - fluticasone furoate-vilanterol (BREO ELLIPTA)  100-25 MCG/ACT AEPB; Inhale 1 puff into the lungs daily.  Dispense: 60 each; Refill: 6  8. Mixed hyperlipidemia  - atorvastatin (LIPITOR) 10 MG tablet; TAKE 1 TABLET (10 MG TOTAL) BY MOUTH DAILY.  Dispense: 90 tablet; Refill: 2  9. Screening for colon cancer - Fecal occult blood, imunochemical([facility])  10. Encounter for screening mammogram for malignant neoplasm of breast - MM Digital Screening; Future  11. Need for shingles vaccine Given prescription to take downstairs to our pharmacy to get her first shingles vaccine. - Zoster Vaccine Adjuvanted [facility]) injection; Inject 0.5 mLs into the muscle once for 1 dose.  Dispense: 0.5 mL; Refill: 0    Patient was given the opportunity to ask questions.  Patient verbalized understanding of the plan and was able to repeat key elements of the plan.   This documentation was completed using Paediatric nurse.  Any transcriptional errors are unintentional.  Orders Placed This Encounter  Procedures   POCT glucose (manual entry)   POCT glycosylated hemoglobin (Hb A1C)     Requested Prescriptions    No prescriptions requested or ordered in this encounter    No follow-ups on file.  Jonah Blue, MD, FACP

## 2022-07-23 ENCOUNTER — Other Ambulatory Visit: Payer: Self-pay

## 2022-07-25 ENCOUNTER — Other Ambulatory Visit: Payer: Self-pay

## 2022-07-25 DIAGNOSIS — Z1231 Encounter for screening mammogram for malignant neoplasm of breast: Secondary | ICD-10-CM

## 2022-07-26 LAB — FECAL OCCULT BLOOD, IMMUNOCHEMICAL: Fecal Occult Bld: NEGATIVE

## 2022-07-27 ENCOUNTER — Other Ambulatory Visit: Payer: Self-pay

## 2022-07-31 ENCOUNTER — Other Ambulatory Visit: Payer: Self-pay

## 2022-08-02 ENCOUNTER — Ambulatory Visit: Payer: Self-pay | Attending: Cardiovascular Disease | Admitting: Cardiovascular Disease

## 2022-08-02 ENCOUNTER — Encounter: Payer: Self-pay | Admitting: Cardiovascular Disease

## 2022-08-02 VITALS — BP 104/68 | HR 83 | Ht 64.0 in | Wt 244.6 lb

## 2022-08-02 DIAGNOSIS — J4530 Mild persistent asthma, uncomplicated: Secondary | ICD-10-CM

## 2022-08-02 DIAGNOSIS — R7303 Prediabetes: Secondary | ICD-10-CM

## 2022-08-02 DIAGNOSIS — I5032 Chronic diastolic (congestive) heart failure: Secondary | ICD-10-CM

## 2022-08-02 DIAGNOSIS — E782 Mixed hyperlipidemia: Secondary | ICD-10-CM

## 2022-08-02 DIAGNOSIS — E662 Morbid (severe) obesity with alveolar hypoventilation: Secondary | ICD-10-CM

## 2022-08-02 DIAGNOSIS — G4733 Obstructive sleep apnea (adult) (pediatric): Secondary | ICD-10-CM

## 2022-08-02 NOTE — Patient Instructions (Signed)
Medication Instructions:  No changes   *If you need a refill on your cardiac medications before your next appointment, please call your pharmacy*   Lab Work:  Not needed   Testing/Procedures: Not needed   Follow-Up: At [facility], you and your health needs are our priority.  As part of our continuing mission to provide you with exceptional heart care, we have created designated Provider Care Teams.  These Care Teams include your primary Cardiologist (physician) and Advanced Practice Providers (APPs -  Physician Assistants and Nurse Practitioners) who all work together to provide you with the care you need, when you need it.     Your next appointment:   12 month(s) sleep clinic  The format for your next appointment:   In Person  Provider:   Dr Nicki Gu[place]    Other Instructions    Continue  with Bi Pap usage.

## 2022-08-02 NOTE — Progress Notes (Incomplete)
Cardiology Office Note    Date:  08/02/2022   ID:  Veronica Castaneda, Veronica Castaneda Dec 16, 1960, MRN [id number]  PCP:  Ladell Pier, MD  Cardiologist:  Shelva Majestic, MD   New sleerp consult referred by Dr. Sallyanne Kuster  History of Present Illness:  Veronica Castaneda is a 61 y.o. female ***    Past Medical History:  Diagnosis Date   Asthma    CHF (congestive heart failure) ([place])    Dyspnea    Hyperlipidemia    Pneumonia    Pre-diabetes    Sleep apnea     Past Surgical History:  Procedure Laterality Date   INSERTION OF MESH  06/15/2021   Procedure: INSERTION OF MESH;  Surgeon: Olean Ree, MD;  Location: [facility] ORS;  Service: General;;   NO PAST SURGERIES     No PAST SURGICAL HISTORY     XI ROBOTIC ASSISTED VENTRAL HERNIA N/A 06/15/2021   Procedure: XI ROBOTIC ASSISTED VENTRAL HERNIA;  Surgeon: Olean Ree, MD;  Location: [facility] ORS;  Service: General;  Laterality: N/A;    Current Medications: Outpatient Medications Prior to Visit  Medication Sig Dispense Refill   albuterol (VENTOLIN HFA) 108 (90 Base) MCG/ACT inhaler INHALE 2 PUFFS INTO THE LUNGS EVERY 4 (FOUR) HOURS AS NEEDED FOR WHEEZING OR SHORTNESS OF BREATH. 18 g 1   atorvastatin (LIPITOR) 10 MG tablet TAKE 1 TABLET (10 MG TOTAL) BY MOUTH DAILY. 90 tablet 2   diclofenac Sodium (VOLTAREN) 1 % GEL Apply 2 g topically 4 (four) times daily. 100 g 1   fluticasone furoate-vilanterol (BREO ELLIPTA) 100-25 MCG/ACT AEPB Inhale 1 puff into the lungs daily. 60 each 6   furosemide (LASIX) 20 MG tablet Take 1 to 2 tablets (20-40 mg) by mouth once daily as needed for swelling. 30 tablet 6   gabapentin (NEURONTIN) 300 MG capsule Take 1 capsule (300 mg total) by mouth 3 (three) times daily. 90 capsule 3   meloxicam (MOBIC) 15 MG tablet Take 1 tablet (15 mg total) by mouth daily. 30 tablet 2   metFORMIN (GLUCOPHAGE) 500 MG tablet TAKE 1/2 TABLET (250 MG TOTAL) BY MOUTH DAILY WITH BREAKFAST. 45 tablet 2   CALCIUM PO Take  1 tablet by mouth daily.     OVER THE COUNTER MEDICATION Place 1 drop into both eyes 2 (two) times daily as needed (itchy eyes). Chamomile eye drops     No facility-administered medications prior to visit.     Allergies:   Patient has no known allergies.   Social History   Socioeconomic History   Marital status: Single    Spouse name: Not on file   Number of children: 3   Years of education: Not on file   Highest education level: 2nd grade  Occupational History   Occupation: house wife  Tobacco Use   Smoking status: Never   Smokeless tobacco: Never  Vaping Use   Vaping Use: Never used  Substance and Sexual Activity   Alcohol use: Never   Drug use: Never   Sexual activity: Not Currently  Other Topics Concern   Not on file  Social History Narrative   Not on file   Social Determinants of Health   Financial Resource Strain: Not on file  Food Insecurity: Not on file  Transportation Needs: Unmet Transportation Needs (12/08/2020)   PRAPARE - Hydrologist (Medical): Yes    Lack of Transportation (Non-Medical): Yes  Physical Activity: Not on file  Stress:  Not on file  Social Connections: Not on file     Family History:  The patient's ***family history includes Heart attack in her mother; Heart disease in her mother.   ROS General: Negative; No fevers, chills, or night sweats;  HEENT: Negative; No changes in vision or hearing, sinus congestion, difficulty swallowing Pulmonary: Negative; No cough, wheezing, shortness of breath, hemoptysis Cardiovascular: Negative; No chest pain, presyncope, syncope, palpitations GI: Negative; No nausea, vomiting, diarrhea, or abdominal pain GU: Negative; No dysuria, hematuria, or difficulty voiding Musculoskeletal: Negative; no myalgias, joint pain, or weakness Hematologic/Oncology: Negative; no easy bruising, bleeding Endocrine: Negative; no heat/cold intolerance; no diabetes Neuro: Negative; no changes in  balance, headaches Skin: Negative; No rashes or skin lesions Psychiatric: Negative; No behavioral problems, depression Sleep: Negative; No snoring, daytime sleepiness, hypersomnolence, bruxism, restless legs, hypnogognic hallucinations, no cataplexy Other comprehensive 14 point system review is negative.   PHYSICAL EXAM:   VS:  BP 104/68 (BP Location: Left Arm, Patient Position: Sitting)   Pulse 83   Ht _0  (1.626 m)   Wt 244 lb 9.6 oz (110.9 kg)   SpO2 96%   BMI 41.99 kg/m    Wt Readings from Last 3 Encounters:  08/02/22 244 lb 9.6 oz (110.9 kg)  07/20/22 246 lb 12.8 oz (111.9 kg)  05/18/22 242 lb 3.2 oz (109.9 kg)    General: Alert, oriented, no distress.  Skin: normal turgor, no rashes, warm and dry HEENT: Normocephalic, atraumatic. Pupils equal round and reactive to light; sclera anicteric; extraocular muscles intact; Fundi ** Nose without nasal septal hypertrophy Mouth/Parynx benign; Mallinpatti scale Neck: No JVD, no carotid bruits; normal carotid upstroke Lungs: clear to ausculatation and percussion; no wheezing or rales Chest wall: without tenderness to palpitation Heart: PMI not displaced, RRR, s1 s2 normal, 1/6 systolic murmur, no diastolic murmur, no rubs, gallops, thrills, or heaves Abdomen: soft, nontender; no hepatosplenomehaly, BS+; abdominal aorta nontender and not dilated by palpation. Back: no CVA tenderness Pulses 2+ Musculoskeletal: full range of motion, normal strength, no joint deformities Extremities: no clubbing cyanosis or edema, Homan's sign negative  Neurologic: grossly nonfocal; Cranial nerves grossly wnl Psychologic: Normal mood and affect   Studies/Labs Reviewed:   ECG (independently read by me): NSR at 87, PVCs. Normal intervals  Recent Labs:    Latest Ref Rng & Units 04/23/2022   12:41 AM 04/21/2022   12:23 AM 04/20/2022    5:53 AM  BMP  Glucose 70 - 99 mg/dL 113  112  120   BUN 6 - 20 mg/dL 10  <5  <5   Creatinine 0.44 - 1.00 mg/dL  0.68  0.59  0.50   Sodium 135 - 145 mmol/L 140  141  141   Potassium 3.5 - 5.1 mmol/L 4.1  3.9  3.9   Chloride 98 - 111 mmol/L 103  102  102   CO2 22 - 32 mmol/L 32  32  33   Calcium 8.9 - 10.3 mg/dL 8.9  8.8  8.5         Latest Ref Rng & Units 04/23/2022   12:41 AM 04/20/2022    5:53 AM 04/19/2022    2:23 PM  Hepatic Function  Total Protein 6.5 - 8.1 g/dL 6.4  7.0  7.1   Albumin 3.5 - 5.0 g/dL 2.5  2.6  2.8   AST 15 - 41 U/L 15  29  49   ALT 0 - 44 U/L 26  47  58   Alk Phosphatase 38 -  126 U/L 114  113  126   Total Bilirubin 0.3 - 1.2 mg/dL 0.8  0.9  0.4        Latest Ref Rng & Units 04/23/2022   12:41 AM 04/22/2022    1:11 AM 04/21/2022   12:23 AM  CBC  WBC 4.0 - 10.5 K/uL 6.7  7.0  6.6   Hemoglobin 12.0 - 15.0 g/dL 12.8  13.1  12.3   Hematocrit 36.0 - 46.0 % 39.9  40.5  39.7   Platelets 150 - 400 K/uL 204  208  217    Lab Results  Component Value Date   MCV 100.0 04/23/2022   MCV 99.8 04/22/2022   MCV 101.3 (H) 04/21/2022   No results found for: "TSH" Lab Results  Component Value Date   HGBA1C 5.8 07/20/2022     BNP    Component Value Date/Time   BNP 25.6 04/23/2022 0041    ProBNP No results found for: "PROBNP"   Lipid Panel     Component Value Date/Time   CHOL 276 (H) 11/24/2020 1102   TRIG 298 (H) 11/24/2020 1102   HDL 41 11/24/2020 1102   CHOLHDL 6.7 (H) 11/24/2020 1102   LDLCALC 177 (H) 11/24/2020 1102   LABVLDL 58 (H) 11/24/2020 1102     RADIOLOGY: No results found.   Additional studies/ records that were reviewed today include:  ***    ASSESSMENT:    No diagnosis found.   PLAN:  ***   Medication Adjustments/Labs and Tests Ordered: Current medicines are reviewed at length with the patient today.  Concerns regarding medicines are outlined above.  Medication changes, Labs and Tests ordered today are listed in the Patient Instructions below. There are no Patient Instructions on file for this visit.   Signed, Shelva Majestic, MD,  [facility], [place], American Board of Sleep Medicine  08/02/2022 9:08 AM    [facility] [address], [place], [place], [place]  [address] Phone: [phone]

## 2022-08-12 ENCOUNTER — Encounter: Payer: Self-pay | Admitting: Cardiovascular Disease

## 2022-08-15 ENCOUNTER — Other Ambulatory Visit: Payer: Self-pay

## 2022-08-21 ENCOUNTER — Ambulatory Visit
Admission: RE | Admit: 2022-08-21 | Discharge: 2022-08-21 | Disposition: A | Discharge status: Home / Self Care | Payer: No Typology Code available for payment source | Source: Ambulatory Visit | Attending: Obstetrics and Gynecology | Admitting: Obstetrics and Gynecology

## 2022-08-21 ENCOUNTER — Ambulatory Visit: Payer: Self-pay | Admitting: *Deleted

## 2022-08-21 VITALS — BP 124/78 | Wt 247.0 lb

## 2022-08-21 DIAGNOSIS — Z1231 Encounter for screening mammogram for malignant neoplasm of breast: Secondary | ICD-10-CM

## 2022-08-21 DIAGNOSIS — Z01419 Encounter for gynecological examination (general) (routine) without abnormal findings: Secondary | ICD-10-CM

## 2022-08-21 NOTE — Patient Instructions (Signed)
Explained breast self awareness with Milus Height. Pap smear completed today. Let her know if today's Pap smear is normal and HPV negative that her next Pap smear will be due in 5 years. Referred patient to the [facility] of [facility] for a screening mammogram on the mobile unit. Appointment scheduled Tuesday, August 21, 2022 at 1150. Patient aware of appointment and will be there. Let patient know will follow up with her within the next couple weeks with results of Pap smear by phone. Informed patient that the Breast Center will follow up with her within the next couple of weeks with results of her mammogram by letter or phone. Veronica Castaneda verbalized understanding.  Ngan Qualls, Kathaleen Maser, RN 11:25 AM

## 2022-08-21 NOTE — Progress Notes (Signed)
Veronica Castaneda is a 61 y.o. G7P0 female who presents to [facility] clinic today with no complaints.    Pap Smear: Pap smear completed today. Last Pap smear was 08/17/2019 and ASCUS with negative HPV and BV. Per patient her last Pap smear is the only abnormal Pap smear she has had. Per patients provider Dr. Jonah Blue recommended next Pap smear in 3 years. Last Pap smear result is in Epic.    Physical exam: Breasts Breasts symmetrical. No skin abnormalities bilateral breasts. No nipple retraction bilateral breasts. No nipple discharge bilateral breasts. No lymphadenopathy. No lumps palpated bilateral breasts. No complaints of pain or tenderness on exam.      MS DIGITAL SCREENING TOMO BILATERAL  Result Date: 12/11/2020 CLINICAL DATA:  Screening. EXAM: DIGITAL SCREENING BILATERAL MAMMOGRAM WITH TOMOSYNTHESIS AND CAD TECHNIQUE: Bilateral screening digital craniocaudal and mediolateral oblique mammograms were obtained. Bilateral screening digital breast tomosynthesis was performed. The images were evaluated with computer-aided detection. COMPARISON:  Previous exam(s). ACR Breast Density Category a: The breast tissue is almost entirely fatty. FINDINGS: There are no findings suspicious for malignancy. The images were evaluated with computer-aided detection. IMPRESSION: No mammographic evidence of malignancy. A result letter of this screening mammogram will be mailed directly to the patient. RECOMMENDATION: Screening mammogram in one year. (Code:SM-B-01Y) BI-RADS CATEGORY  1: Negative. Electronically Signed   By: Frederico Hamman M.D.   On: 12/11/2020 08:40   MS DIGITAL SCREENING TOMO BILATERAL  Result Date: 10/27/2019 CLINICAL DATA:  Screening. EXAM: DIGITAL SCREENING BILATERAL MAMMOGRAM WITH TOMO AND CAD COMPARISON:  None. ACR Breast Density Category b: There are scattered areas of fibroglandular density. FINDINGS: There are no findings suspicious for malignancy. Images were processed with  CAD. IMPRESSION: No mammographic evidence of malignancy. A result letter of this screening mammogram will be mailed directly to the patient. RECOMMENDATION: Screening mammogram in one year. (Code:SM-B-01Y) BI-RADS CATEGORY  1: Negative. Electronically Signed   By: Baird Lyons M.D.   On: 10/27/2019 11:12    Pelvic/Bimanual Ext Genitalia No lesions, no swelling and no discharge observed on external genitalia.        Vagina Vagina pink and normal texture. No lesions or discharge observed in vagina.        Cervix Cervix is present. Cervix pink and of normal texture. No discharge observed.    Uterus Uterus is present and palpable. Uterus in normal position and normal size.        Adnexae Bilateral ovaries present and palpable. No tenderness on palpation.         Rectovaginal No rectal exam completed today since patient had no rectal complaints. No skin abnormalities observed on exam.     Smoking History: Patient has never smoked.    Patient Navigation: Patient education provided. Access to services provided for patient through [place] program. Spanish interpreter Natale Lay from [facility] provided.   Colorectal Cancer Screening: Per patient has never had colonoscopy completed. FIT Test completed 07/23/2022, 11/25/2020 and 11/16/2019 that were both negative. No complaints today.    Breast and Cervical Cancer Risk Assessment: Patient does not have family history of breast cancer, known genetic mutations, or radiation treatment to the chest before age 84. Patient does not have history of cervical dysplasia, immunocompromised, or DES exposure in-utero.  Risk Scores as of 08/21/2022     Dondra Spry           5-year 1 %   Lifetime 5.47 %   This patient is Hispana/Latina but has no documented birth  country, so the [place] model used data from [place] patients to calculate their risk score. Document a birth country in the Demographics activity for a more accurate score.         Last calculated by  Caprice Red, CMA on 08/21/2022 at 11:24 AM        A: BCCCP exam with pap smear No complaints.  P: Referred patient to the [facility] of [facility] for a screening mammogram on the mobile unit. Appointment scheduled Tuesday, August 21, 2022 at 1150.    Priscille Heidelberg, RN 08/21/2022 11:25 AM

## 2022-08-27 ENCOUNTER — Telehealth: Payer: Self-pay | Admitting: *Deleted

## 2022-08-27 LAB — CYTOLOGY - PAP
Comment: NEGATIVE
Diagnosis: NEGATIVE
High risk HPV: NEGATIVE

## 2022-08-27 NOTE — Telephone Encounter (Signed)
Pt calling for lab results from 10/23. Message read to pt, verbalizes understanding.

## 2022-09-03 ENCOUNTER — Other Ambulatory Visit: Payer: Self-pay

## 2022-09-13 ENCOUNTER — Telehealth: Payer: Self-pay

## 2022-09-13 NOTE — Telephone Encounter (Signed)
With [employer], Natale Lay, pt has been advised of her negative PAP/HPV results. She understands to repeat her PAP in five years.

## 2022-10-04 ENCOUNTER — Other Ambulatory Visit: Payer: Self-pay

## 2022-10-24 ENCOUNTER — Telehealth: Payer: Self-pay | Admitting: Emergency Medicine

## 2022-10-24 NOTE — Telephone Encounter (Signed)
error 

## 2022-11-06 ENCOUNTER — Ambulatory Visit: Payer: Self-pay

## 2022-11-07 ENCOUNTER — Ambulatory Visit: Payer: Self-pay | Attending: Internal Medicine

## 2022-11-20 ENCOUNTER — Encounter: Payer: Self-pay | Admitting: Internal Medicine

## 2022-11-20 ENCOUNTER — Ambulatory Visit: Payer: Self-pay | Attending: Internal Medicine | Admitting: Internal Medicine

## 2022-11-20 ENCOUNTER — Other Ambulatory Visit: Payer: Self-pay

## 2022-11-20 VITALS — BP 104/68 | HR 75 | Temp 97.6°F | Ht 64.0 in | Wt 251.0 lb

## 2022-11-20 DIAGNOSIS — I5032 Chronic diastolic (congestive) heart failure: Secondary | ICD-10-CM | POA: Insufficient documentation

## 2022-11-20 DIAGNOSIS — R7303 Prediabetes: Secondary | ICD-10-CM

## 2022-11-20 DIAGNOSIS — J4530 Mild persistent asthma, uncomplicated: Secondary | ICD-10-CM

## 2022-11-20 DIAGNOSIS — E662 Morbid (severe) obesity with alveolar hypoventilation: Secondary | ICD-10-CM

## 2022-11-20 DIAGNOSIS — Z23 Encounter for immunization: Secondary | ICD-10-CM

## 2022-11-20 DIAGNOSIS — J9611 Chronic respiratory failure with hypoxia: Secondary | ICD-10-CM

## 2022-11-20 DIAGNOSIS — E782 Mixed hyperlipidemia: Secondary | ICD-10-CM

## 2022-11-20 DIAGNOSIS — Z9981 Dependence on supplemental oxygen: Secondary | ICD-10-CM

## 2022-11-20 MED ORDER — METFORMIN HCL 500 MG PO TABS
250.0000 mg | ORAL_TABLET | Freq: Every day | ORAL | 2 refills | Status: DC
  Filled 2022-11-20: qty 45, 90d supply, fill #0

## 2022-11-20 MED ORDER — ATORVASTATIN CALCIUM 10 MG PO TABS
10.0000 mg | ORAL_TABLET | Freq: Every day | ORAL | 2 refills | Status: DC
  Filled 2022-11-20: qty 90, 90d supply, fill #0

## 2022-11-20 MED ORDER — ZOSTER VAC RECOMB ADJUVANTED 50 MCG/0.5ML IM SUSR
0.5000 mL | Freq: Once | INTRAMUSCULAR | 1 refills | Status: AC
  Filled 2022-11-20: qty 1, 2d supply, fill #0

## 2022-11-20 MED ORDER — FUROSEMIDE 20 MG PO TABS
20.0000 mg | ORAL_TABLET | Freq: Every day | ORAL | 6 refills | Status: DC | PRN
  Filled 2022-11-20: qty 60, 30d supply, fill #0

## 2022-11-20 MED ORDER — FLUTICASONE FUROATE-VILANTEROL 100-25 MCG/ACT IN AEPB
1.0000 | INHALATION_SPRAY | Freq: Every day | RESPIRATORY_TRACT | 6 refills | Status: DC
  Filled 2022-11-20: qty 120, 60d supply, fill #0
  Filled 2023-01-18: qty 180, 90d supply, fill #1

## 2022-11-20 NOTE — Patient Instructions (Signed)
Alimentacin saludable en los [employer], Adult Una alimentacin saludable puede ayudarlo a Science writer y Theatre manager un peso saludable, reducir el riesgo de tener enfermedades crnicas y vivir Ardelia Mems vida larga y productiva. Es importante que siga una modalidad de alimentacin saludable. Sus necesidades nutricionales y calricas deben satisfacerse principalmente con distintos alimentos ricos en nutrientes. Consejos para seguir Catering manager Lea las etiquetas de los alimentos Lea las etiquetas y elija las que digan lo siguiente: Productos reducidos en sodio o con bajo contenido de [place]. Jugos con 100 % jugo de fruta. Alimentos con bajo contenido de grasas saturadas (menos de 3 g por porcin) y alto contenido de grasas poliinsaturadas y Veterinary surgeon. Alimentos con cereales integrales, como trigo integral, trigo partido, arroz integral y arroz salvaje. Cereales integrales fortificados con cido flico. Esto se recomienda a las mujeres embarazadas o que desean quedar embarazadas. Lea las etiquetas y no coma ni beba lo siguiente: Alimentos o bebidas con azcar agregada. Estos incluyen los alimentos que contienen azcar moreno, endulzante a base de maz, jarabe de maz, dextrosa, fructosa, glucosa, jarabe de maz de alta fructosa, miel, azcar invertido, lactosa, jarabe de [place], maltosa, [place], azcar sin refinar, sacarosa, trehalosa y azcar turbinado. Limite el consumo de azcar agregada a menos del 10 % del total de caloras diarias. No consuma ms que las siguientes cantidades de azcar agregada por da: 6 cucharaditas (25 g) para las mujeres. 9 cucharaditas (38 g) para los hombres. Los alimentos que contienen almidones y cereales refinados o procesados. Los productos de cereales refinados, como harina blanca, harina de maz desgerminada, pan blanco y arroz blanco. Al ir de compras Elija refrigerios ricos en nutrientes, como verduras, frutas enteras y frutos secos. Evite los refrigerios con  alto contenido de caloras y Location manager, como las papas fritas, los refrigerios frutales y los caramelos. Use alios y productos para untar a base de aceite con los Building surveyor de grasas slidas como la [place], la [place], la crema agria o el queso crema. Limite las salsas, las mezclas y los productos "instantneos" preelaborados como el arroz saborizado, los fideos instantneos y las pastas listas para comer. Pruebe ms fuentes de protena vegetal, como tofu, tempeh, frijoles negros, edamame, lentejas, frutos secos y semillas. Explore planes de alimentacin como la dieta mediterrnea o la dieta vegetariana. Pruebe salsas cardiosaludables hechas con frijoles y grasas saludables, como hummus y [place]. Las verduras van muy bien con ellas. Al cocinar Use aceite para Lobbyist de grasas slidas como [place], margarina o [place] de [place]. En lugar de frer, trate de cocinar en el horno, en la plancha o en la parrilla, o hervir los alimentos. Retire la parte grasa de las carnes antes de cocinarlas. Cocine las verduras al vapor en agua o caldo. Planificacin de las comidas  En las comidas, imagine dividir su plato en cuartos: La mitad del plato tiene frutas y verduras. Un cuarto del plato tiene cereales integrales. Un cuarto del plato tiene protena, especialmente carnes [place], aves, huevos, tofu, frijoles o frutos secos. Incluya lcteos descremados en su dieta diaria. Estilo de vida Elija opciones saludables en todos los mbitos, como en el hogar, el [place], la [place], los restaurantes y [place]. Prepare los alimentos de un modo seguro: Lvese las manos despus de manipular carnes crudas. Donde prepare alimentos, mantenga las superficies limpias lavndolas regularmente con agua caliente y [place]. Mantenga las carnes crudas separadas de los alimentos que estn listos para comer como las frutas y las verduras. Cocine los frutos  de mar, carnes, aves y Musician la temperatura recomendada. Consiga un termmetro para alimentos. Almacene los alimentos a temperaturas seguras. En general: Mantenga los alimentos fros a una temperatura de 40 F (4,4 C) o inferior. Mantenga los alimentos calientes a una temperatura de 140 F (60 C) o superior. Mantenga el congelador a una temperatura de 0 F (-17,8 C) o inferior. Los alimentos no son seguros para su consumo cuando han estado a una temperatura de entre 62 y 54 F (4.4 y 30 C) por ms de 2 horas. Qu alimentos debo comer? Frutas Propngase comer entre 1 y 2 tazas de frutas frescas, IT sales professional (en su jugo natural) o Wellsite geologist. Una taza de fruta equivale a 1 manzana pequea, 1 banana grande, 8 fresas grandes, 1 taza (237 g) de fruta enlatada,  taza (82 g) de fruta seca o 1 taza (240 ml) de jugo al 100 %. Verduras Propngase comer de 2 a 4 tazas de verduras frescas y Wellsite geologist, incluyendo diferentes variedades y colores. Una taza de verduras equivale a 1 taza (91 g) de brcoli o coliflor, 2 zanahorias medianas, 2 tazas (150 g) de verduras de [employer] crudas, 1 tomate grande, 1 pimiento morrn grande, 1 batata grande o 1 patata blanca mediana. Cereales Propngase comer el equivalente a entre 4 y 47 onzas de cereales integrales por Training and development officer. Algunos ejemplos de equivalentes a 1 onza de cereales son 1 rebanada de pan, 1 taza (40 g) de cereal listo para comer, 3 tazas (24 g) de palomitas de maz o  taza (93 g) de arroz cocido. Carnes y otras protenas Propngase comer el equivalente a entre 5 y 40  onzas de protena por Training and development officer. Algunos ejemplos de equivalentes a 1 onza de protenas incluyen 1 huevo,  oz de frutos secos (12 almendras, 24 pistachos o 7 mitades de nueces), 1/4 taza (90 g) de frijoles cocidos, 6 cucharadas (90 g) de hummus o 1 cucharada (16 g) de [place] de man. Un corte de carne o pescado del tamao de un mazo de cartas equivale aproximadamente a 3 a 4 onzas (85  g). De las protenas que consume cada semana, intente que al menos 8 onzas (227 g) sean frutos de mar. Esto equivale a unas 2 porciones por semana. Esto incluye salmn, trucha, arenque y anchoas. Lcteos DIRECTV a 3 tazas de lcteos descremados o con bajo contenido de Public librarian. Algunos ejemplos de equivalentes a 1 taza de lcteos son 1 taza (240 ml) de leche, 8 onzas (250 g) de yogur, 1 onzas (44 g) de queso natural o 1 taza (240 ml) de leche de soja fortificada. Grasas y aceites Propngase consumir alrededor de 5 cucharaditas (21 g) de grasas y Science writer. Elija grasas monoinsaturadas, como el aceite de canola y de [place], la [place] con aceite de [place] o de [place], [place], [place] de man y Media planner de los frutos secos, o bien grasas poliinsaturadas, como el aceite de [place], maz y soja, nueces, piones, semillas de ssamo, semillas de girasol y semillas de lino. Bebidas Propngase beber 6 vasos de 8 onzas de Public affairs consultant. Limite el caf a entre 3 y 82 tazas de ocho onzas por Training and development officer. Limite el consumo de bebidas con cafena que tengan caloras agregadas, como los refrescos y las bebidas energizantes. Si bebe alcohol: Limite la cantidad que bebe a lo siguiente: De 0 a 1 medida al da si es [place]. De 0 a 2 medidas  al da si es varn. Sepa cunta cantidad de alcohol hay en las bebidas que toma. En los [place], una medida es una botella de cerveza de 12 oz (355 ml), un vaso de vino de 5 oz (148 ml) o un vaso de una bebida alcohlica de alta graduacin de 1 oz (44 ml). Condimentos y otros alimentos Trate de no agregar demasiada sal a los alimentos. Trate de usar hierbas y especias en lugar de sal. Trate de no agregar azcar a los alimentos. Esta informacin se basa en las pautas de nutricin de los [place]. Para obtener ms informacin, visite [email or web address].es. Las Dealer. Es posible que necesite cantidades  diferentes. Esta informacin no tiene Marine scientist el consejo del mdico. Asegrese de hacerle al mdico cualquier pregunta que tenga. Document Revised: 07/10/2022 Document Reviewed: 07/10/2022 Elsevier Patient Education  [place].

## 2022-11-20 NOTE — Progress Notes (Signed)
Patient ID: Veronica Castaneda, female    DOB: 26-Dec-1960  MRN: [id number]  CC: Chronic disease management  Subjective: Veronica Castaneda is a 62 y.o. female who presents for chronic disease management Her concerns today include:  Pt with preDM/morbid obesity, OA LT knee, mixed HL, persistent asthma, hypoventilation syndrome with likely OSA on BiPAP, chronic hypoxia on home O2  AMN Language interpreter used during this encounter. #Ervin, [id number]   OHS/presume OSA/chronic hypoxia/diastolic CHF: Since last visit with me 04/2022, she has followed up with cardiologist and sleep specialist Dr. Claiborne Billings 07/2022.  She told him that she was not using her BiPAP consistently.  He stressed the importance of consistency of using her BiPAP and the importance of weight loss. According to his note, he had plan to touch base with [facility] to have information sent to his office about her BiPAP use. -today she reports she has been using it every night since then.  Feels less tired during the day Uses the O2 only at nights.  She does not know how many liters she uses to bleed into BiPAP -no LE edema.  Endorses taking Furosemide 20 mg daily.  Tries to limit salt "but sometimes I go over board."   No increase SOB.  "Right now breathing is normal."  Sleeps on 2 pillows   Obesity/prediabetes: Still on metformin.  Weight is up 9 pounds since last visit with me.  She attributes to over eating.  Declines referral to nutritionist stating this is something she can work on by herself. Not getting in any exercise, too cold outside and thinks it will affect her lungs  Asthma: Reports compliance with inhalers . She is suppose to be on Breo and Albuterol inhalers.  She uses the [place] daily and the Albuterol about every 3 days  HM:  agrees to receiving shingles vaccine series    Patient Active Problem List   Diagnosis Date Noted   Chronic right-sided heart failure ([place]) 05/18/2022   Asthma in adult,  mild persistent, uncomplicated [id number]   Chronic respiratory failure with hypoxia, on home oxygen therapy ([place]) 05/18/2022   CAP (community acquired pneumonia) 04/20/2022   Abnormal LFTs 04/20/2022   Ventral hernia without obstruction or gangrene    Umbilical hernia without obstruction and without gangrene    Hyperventilation syndrome 11/24/2020   Asthma exacerbation 05/22/2020   Morbid obesity with body mass index (BMI) of 50.0 to 59.9 in adult ([place]) 05/22/2020   Obesity hypoventilation syndrome ([place]) 05/22/2020   OSA (obstructive sleep apnea) 05/22/2020   RSV (respiratory syncytial virus infection) 05/22/2020   Difficult airway for intubation 05/22/2020   Acute respiratory failure with hypoxia ([place]) 05/12/2020   Loud snoring 02/11/2020   Daytime sleepiness 02/11/2020   Screening breast examination 10/22/2019   Prediabetes 08/18/2019   Hyperlipidemia 08/18/2019   Morbid obesity ([place]) 08/17/2019     Current Outpatient Medications on File Prior to Visit  Medication Sig Dispense Refill   albuterol (VENTOLIN HFA) 108 (90 Base) MCG/ACT inhaler INHALE 2 PUFFS INTO THE LUNGS EVERY 4 (FOUR) HOURS AS NEEDED FOR WHEEZING OR SHORTNESS OF BREATH. 18 g 1   atorvastatin (LIPITOR) 10 MG tablet TAKE 1 TABLET (10 MG TOTAL) BY MOUTH DAILY. 90 tablet 2   diclofenac Sodium (VOLTAREN) 1 % GEL Apply 2 g topically 4 (four) times daily. 100 g 1   gabapentin (NEURONTIN) 300 MG capsule Take 1 capsule (300 mg total) by mouth 3 (three) times daily. 90 capsule 3   metFORMIN (  GLUCOPHAGE) 500 MG tablet TAKE 1/2 TABLET (250 MG TOTAL) BY MOUTH DAILY WITH BREAKFAST. 45 tablet 2   fluticasone furoate-vilanterol (BREO ELLIPTA) 100-25 MCG/ACT AEPB Inhale 1 puff into the lungs daily. (Patient not taking: Reported on 11/20/2022) 60 each 6   furosemide (LASIX) 20 MG tablet Take 1 to 2 tablets (20-40 mg) by mouth once daily as needed for swelling. (Patient not taking: Reported on 08/21/2022) 30 tablet 6   meloxicam (MOBIC)  15 MG tablet Take 1 tablet (15 mg total) by mouth daily. (Patient not taking: Reported on 11/20/2022) 30 tablet 2   No current facility-administered medications on file prior to visit.    No Known Allergies  Social History   Socioeconomic History   Marital status: Single    Spouse name: Not on file   Number of children: 3   Years of education: Not on file   Highest education level: 2nd grade  Occupational History   Occupation: house wife  Tobacco Use   Smoking status: Never   Smokeless tobacco: Never  Vaping Use   Vaping Use: Never used  Substance and Sexual Activity   Alcohol use: Never   Drug use: Never   Sexual activity: Yes    Birth control/protection: None  Other Topics Concern   Not on file  Social History Narrative   Not on file   Social Determinants of Health   Financial Resource Strain: Not on file  Food Insecurity: No Food Insecurity (08/21/2022)   Hunger Vital Sign    Worried About Running Out of Food in the Last Year: Never true    Ran Out of Food in the Last Year: Never true  Transportation Needs: No Transportation Needs (08/21/2022)   PRAPARE - Hydrologist (Medical): No    Lack of Transportation (Non-Medical): No  Physical Activity: Not on file  Stress: Not on file  Social Connections: Not on file  Intimate Partner Violence: Not on file    Family History  Problem Relation Age of Onset   Heart attack Mother    Heart disease Mother    Breast cancer Neg Hx     Past Surgical History:  Procedure Laterality Date   INSERTION OF MESH  06/15/2021   Procedure: INSERTION OF MESH;  Surgeon: Olean Ree, MD;  Location: [facility] ORS;  Service: General;;   NO PAST SURGERIES     No PAST SURGICAL HISTORY     XI ROBOTIC ASSISTED VENTRAL HERNIA N/A 06/15/2021   Procedure: XI ROBOTIC [place];  Surgeon: Olean Ree, MD;  Location: [facility] ORS;  Service: General;  Laterality: N/A;    ROS: Review of Systems Negative  except as stated above  PHYSICAL EXAM: BP 104/68 (BP Location: Left Arm, Patient Position: Sitting, Cuff Size: Large)   Pulse 75   Temp 97.6 F (36.4 C) (Oral)   Ht '5\' 4"'$  (1.626 m)   Wt 251 lb (113.9 kg)   SpO2 92%   BMI 43.08 kg/m   Wt Readings from Last 3 Encounters:  11/20/22 251 lb (113.9 kg)  08/21/22 247 lb (112 kg)  08/02/22 244 lb 9.6 oz (110.9 kg)    Physical Exam  General appearance - alert, well appearing, morbidly obese FM and in no distress Mental status - normal mood, behavior, speech, dress, motor activity, and thought processes Neck - supple, no significant adenopathy Chest - clear to auscultation, no wheezes, rales or rhonchi, symmetric air entry Heart - normal rate, regular rhythm, normal S1,  S2, no murmurs, rubs, clicks or gallops Extremities - trace BL Le edema      Latest Ref Rng & Units 04/23/2022   12:41 AM 04/21/2022   12:23 AM 04/20/2022    5:53 AM  CMP  Glucose 70 - 99 mg/dL 113  112  120   BUN 6 - 20 mg/dL 10  <5  <5   Creatinine 0.44 - 1.00 mg/dL 0.68  0.59  0.50   Sodium 135 - 145 mmol/L 140  141  141   Potassium 3.5 - 5.1 mmol/L 4.1  3.9  3.9   Chloride 98 - 111 mmol/L 103  102  102   CO2 22 - 32 mmol/L 32  32  33   Calcium 8.9 - 10.3 mg/dL 8.9  8.8  8.5   Total Protein 6.5 - 8.1 g/dL 6.4   7.0   Total Bilirubin 0.3 - 1.2 mg/dL 0.8   0.9   Alkaline Phos 38 - 126 U/L 114   113   AST 15 - 41 U/L 15   29   ALT 0 - 44 U/L 26   47    Lipid Panel     Component Value Date/Time   CHOL 276 (H) 11/24/2020 1102   TRIG 298 (H) 11/24/2020 1102   HDL 41 11/24/2020 1102   CHOLHDL 6.7 (H) 11/24/2020 1102   LDLCALC 177 (H) 11/24/2020 1102    CBC    Component Value Date/Time   WBC 6.7 04/23/2022 0041   RBC 3.99 04/23/2022 0041   HGB 12.8 04/23/2022 0041   HGB 14.8 11/24/2020 1102   HCT 39.9 04/23/2022 0041   HCT 44.8 11/24/2020 1102   PLT 204 04/23/2022 0041   PLT 221 11/24/2020 1102   MCV 100.0 04/23/2022 0041   MCV 96 11/24/2020 1102    MCH 32.1 04/23/2022 0041   MCHC 32.1 04/23/2022 0041   RDW 12.6 04/23/2022 0041   RDW 12.1 11/24/2020 1102   LYMPHSABS 1.7 04/23/2022 0041   MONOABS 0.7 04/23/2022 0041   EOSABS 0.3 04/23/2022 0041   BASOSABS 0.1 04/23/2022 0041    ASSESSMENT AND PLAN: 1. Obesity hypoventilation syndrome ([place]) Encouraged her to continue to use the BiPAP consistently every night.  2. Chronic respiratory failure with hypoxia, on home oxygen therapy ([place]) She will continue home O2 at night with her BiPAP.  3. Chronic diastolic heart failure ([place]) Compensated. DASH diet discussed and encouraged. - furosemide (LASIX) 20 MG tablet; Take 1 to 2 tablets (20-40 mg) by mouth once daily as needed for swelling.  Dispense: 60 tablet; Refill: 6  4. Morbid obesity ([place]) 5. Prediabetes Discussed on encourage healthy eating habits. Declined referral to see a nutritionist. Encouraged her to get in some exercise which she can do indoors at home like walking in place for 10 to 15 minutes while watching a program on TV. - metFORMIN (GLUCOPHAGE) 500 MG tablet; TAKE 1/2 TABLET (250 MG TOTAL) BY MOUTH DAILY WITH BREAKFAST.  Dispense: 45 tablet; Refill: 2  6. Asthma in adult, mild persistent, uncomplicated Continue Breo - fluticasone furoate-vilanterol (BREO ELLIPTA) 100-25 MCG/ACT AEPB; Inhale 1 puff into the lungs daily.  Dispense: 60 each; Refill: 6  7. Mixed hyperlipidemia - atorvastatin (LIPITOR) 10 MG tablet; TAKE 1 TABLET (10 MG TOTAL) BY MOUTH DAILY.  Dispense: 90 tablet; Refill: 2  8. Need for shingles vaccine Printed prescription given for her to take to our pharmacy. - Zoster Vaccine Adjuvanted [facility]) injection; Inject 0.5 mLs into the muscle once for  1 dose.  Dispense: 0.5 mL; Refill: 0    Patient was given the opportunity to ask questions.  Patient verbalized understanding of the plan and was able to repeat key elements of the plan.   This documentation was completed using Management consultant.  Any transcriptional errors are unintentional.  No orders of the defined types were placed in this encounter.    Requested Prescriptions    No prescriptions requested or ordered in this encounter    No follow-ups on file.  Karle Plumber, MD, FACP

## 2022-12-03 ENCOUNTER — Other Ambulatory Visit: Payer: Self-pay

## 2022-12-31 ENCOUNTER — Other Ambulatory Visit: Payer: Self-pay

## 2023-01-18 ENCOUNTER — Other Ambulatory Visit: Payer: Self-pay

## 2023-01-21 ENCOUNTER — Other Ambulatory Visit: Payer: Self-pay

## 2023-01-24 ENCOUNTER — Other Ambulatory Visit: Payer: Self-pay

## 2023-01-29 ENCOUNTER — Other Ambulatory Visit: Payer: Self-pay

## 2023-03-21 ENCOUNTER — Ambulatory Visit: Payer: Self-pay | Admitting: Internal Medicine

## 2023-04-11 ENCOUNTER — Encounter: Payer: Self-pay | Admitting: Internal Medicine

## 2023-04-11 ENCOUNTER — Other Ambulatory Visit: Payer: Self-pay

## 2023-04-11 ENCOUNTER — Ambulatory Visit: Payer: Self-pay | Attending: Internal Medicine | Admitting: Internal Medicine

## 2023-04-11 DIAGNOSIS — I5032 Chronic diastolic (congestive) heart failure: Secondary | ICD-10-CM

## 2023-04-11 DIAGNOSIS — R7303 Prediabetes: Secondary | ICD-10-CM

## 2023-04-11 DIAGNOSIS — E662 Morbid (severe) obesity with alveolar hypoventilation: Secondary | ICD-10-CM

## 2023-04-11 DIAGNOSIS — J4530 Mild persistent asthma, uncomplicated: Secondary | ICD-10-CM

## 2023-04-11 DIAGNOSIS — E782 Mixed hyperlipidemia: Secondary | ICD-10-CM

## 2023-04-11 DIAGNOSIS — Z23 Encounter for immunization: Secondary | ICD-10-CM

## 2023-04-11 DIAGNOSIS — M170 Bilateral primary osteoarthritis of knee: Secondary | ICD-10-CM

## 2023-04-11 LAB — POCT GLYCOSYLATED HEMOGLOBIN (HGB A1C): HbA1c, POC (controlled diabetic range): 5.8 % (ref 0.0–7.0)

## 2023-04-11 LAB — GLUCOSE, POCT (MANUAL RESULT ENTRY): POC Glucose: 135 mg/dl — AB (ref 70–99)

## 2023-04-11 MED ORDER — FUROSEMIDE 20 MG PO TABS
20.0000 mg | ORAL_TABLET | Freq: Every day | ORAL | 6 refills | Status: DC | PRN
  Filled 2023-04-11: qty 60, 30d supply, fill #0

## 2023-04-11 MED ORDER — ZOSTER VAC RECOMB ADJUVANTED 50 MCG/0.5ML IM SUSR: 0.5000 mL | Freq: Once | INTRAMUSCULAR | 0 refills | Status: AC

## 2023-04-11 MED ORDER — FLUTICASONE FUROATE-VILANTEROL 100-25 MCG/ACT IN AEPB
1.0000 | INHALATION_SPRAY | Freq: Every day | RESPIRATORY_TRACT | 6 refills | Status: AC
  Filled 2023-04-11: qty 180, 90d supply, fill #0

## 2023-04-11 MED ORDER — PHENTERMINE HCL 15 MG PO CAPS
15.0000 mg | ORAL_CAPSULE | ORAL | 1 refills | Status: DC
  Filled 2023-04-11: qty 30, 30d supply, fill #0

## 2023-04-11 MED ORDER — ALBUTEROL SULFATE HFA 108 (90 BASE) MCG/ACT IN AERS
2.0000 | INHALATION_SPRAY | RESPIRATORY_TRACT | 1 refills | Status: AC | PRN
  Filled 2023-04-11: qty 6.7, 17d supply, fill #0

## 2023-04-11 MED ORDER — ATORVASTATIN CALCIUM 10 MG PO TABS
10.0000 mg | ORAL_TABLET | Freq: Every day | ORAL | 2 refills | Status: DC
  Filled 2023-04-11: qty 90, 90d supply, fill #0

## 2023-04-11 NOTE — Patient Instructions (Signed)
We have started you on a low-dose of a medication called phentermine to help decrease appetite and bring about some weight loss.  If you develop any feeling of heart racing while on this medicine, please stop it and be seen in the emergency room.  I will have you follow-up with me in 1 month to see how you are doing on the medicine.  Please go to [facility] radiology department to get x-rays done of the knees.

## 2023-04-11 NOTE — Progress Notes (Signed)
Patient ID: Veronica Castaneda, female    DOB: 10-Jul-1961  MRN: [id number]  CC: Obesity (Obesity hypoventilation syndrome. [employer] supplies for machine used for sleeping. )   Subjective: Veronica Castaneda is a 62 y.o. female who presents for chronic ds management Her concerns today include:  Pt with preDM/morbid obesity, OA LT knee, mixed HL, persistent asthma, hypoventilation syndrome with likely OSA on BiPAP, chronic hypoxia on home O2   AMN Language interpreter used during this encounter. #Juan [id number], [id number]  My medical student Leonor Liv participated in this visit by taking the history.  Pt did not bring meds with her  OA:  knees getting worse LT>RT Hurts when walking and with sitting.  Feels like bones grinding when she walks. Worse in cold weather as well Takes Tylenol 1-2x/wk.  She has Voltaren gel at home which he uses at times. No updated knee x-rays on chart.  Pt uninsured Walks 2x/wk.  No falls.  Obesity/PreDM:  Results for orders placed or performed in visit on 04/11/23  POCT glucose (manual entry)  Result Value Ref Range   POC Glucose 135 (A) 70 - 99 mg/dl  POCT glycosylated hemoglobin (Hb A1C)  Result Value Ref Range   Hemoglobin A1C     HbA1c POC (<> result, manual entry)     HbA1c, POC (prediabetic range)     HbA1c, POC (controlled diabetic range) 5.8 0.0 - 7.0 %   taking Metformin daily as prescribed She has not had any significant weight loss.  Reports that it is difficult to control appetite.    OHS/presume OSA/chronic hypoxia/diastolic CHF: Reports compliance with BiPAP using it about 6 hours every night.  She feels she benefits from use.  Wakes up feeling well rested in the mornings.  Thinks she has had some swelling in the legs.  No SOB/chest pain In regards to her asthma, she is doing well on Breo inhaler.  HL: Should be on atorvastatin 10 mg.  Based on last refill, she should have been out of it in May and has not  returned for refill. Patient Active Problem List   Diagnosis Date Noted   Chronic diastolic heart failure (HCC) 11/20/2022   Chronic right-sided heart failure (HCC) 05/18/2022   Asthma in adult, mild persistent, uncomplicated 05/18/2022   Chronic respiratory failure with hypoxia, on home oxygen therapy (HCC) 05/18/2022   CAP (community acquired pneumonia) 04/20/2022   Abnormal LFTs 04/20/2022   Ventral hernia without obstruction or gangrene    Umbilical hernia without obstruction and without gangrene    Hyperventilation syndrome 11/24/2020   Asthma exacerbation 05/22/2020   Morbid obesity with body mass index (BMI) of 50.0 to 59.9 in adult (HCC) 05/22/2020   Obesity hypoventilation syndrome (HCC) 05/22/2020   OSA (obstructive sleep apnea) 05/22/2020   RSV (respiratory syncytial virus infection) 05/22/2020   Difficult airway for intubation 05/22/2020   Acute respiratory failure with hypoxia (HCC) 05/12/2020   Loud snoring 02/11/2020   Daytime sleepiness 02/11/2020   Screening breast examination 10/22/2019   Prediabetes 08/18/2019   Hyperlipidemia 08/18/2019   Morbid obesity (HCC) 08/17/2019     Current Outpatient Medications on File Prior to Visit  Medication Sig Dispense Refill   diclofenac Sodium (VOLTAREN) 1 % GEL Apply 2 g topically 4 (four) times daily. 100 g 1   gabapentin (NEURONTIN) 300 MG capsule Take 1 capsule (300 mg total) by mouth 3 (three) times daily. 90 capsule 3   meloxicam (MOBIC) 15 MG tablet Take 1 tablet (  15 mg total) by mouth daily. 30 tablet 2   metFORMIN (GLUCOPHAGE) 500 MG tablet Take 0.5 tablets (250 mg total) by mouth daily with breakfast. 45 tablet 2   No current facility-administered medications on file prior to visit.    No Known Allergies  Social History   Socioeconomic History   Marital status: Single    Spouse name: Not on file   Number of children: 3   Years of education: Not on file   Highest education level: 2nd grade  Occupational  History   Occupation: house wife  Tobacco Use   Smoking status: Never   Smokeless tobacco: Never  Vaping Use   Vaping status: Never Used  Substance and Sexual Activity   Alcohol use: Never   Drug use: Never   Sexual activity: Yes    Birth control/protection: None  Other Topics Concern   Not on file  Social History Narrative   Not on file   Social Determinants of Health   Financial Resource Strain: Not on file  Food Insecurity: No Food Insecurity (08/21/2022)   Hunger Vital Sign    Worried About Running Out of Food in the Last Year: Never true    Ran Out of Food in the Last Year: Never true  Transportation Needs: No Transportation Needs (08/21/2022)   PRAPARE - Administrator, Civil Service (Medical): No    Lack of Transportation (Non-Medical): No  Physical Activity: Not on file  Stress: Not on file  Social Connections: Not on file  Intimate Partner Violence: Not on file    Family History  Problem Relation Age of Onset   Heart attack Mother    Heart disease Mother    Breast cancer Neg Hx     Past Surgical History:  Procedure Laterality Date   INSERTION OF MESH  06/15/2021   Procedure: INSERTION OF MESH;  Surgeon: Henrene Dodge, MD;  Location: [facility] ORS;  Service: General;;   NO PAST SURGERIES     No PAST SURGICAL HISTORY     XI ROBOTIC ASSISTED VENTRAL HERNIA N/A 06/15/2021   Procedure: XI ROBOTIC ASSISTED VENTRAL HERNIA;  Surgeon: Henrene Dodge, MD;  Location: [facility] ORS;  Service: General;  Laterality: N/A;    ROS: Review of Systems Negative except as stated above  PHYSICAL EXAM: BP 100/64 (BP Location: Left Arm, Patient Position: Sitting, Cuff Size: Large)   Pulse 75   Temp 98.3 F (36.8 C) (Oral)   Ht 5\' 4"  (1.626 m)   Wt 250 lb (113.4 kg)   SpO2 93%   BMI 42.91 kg/m   Wt Readings from Last 3 Encounters:  04/11/23 250 lb (113.4 kg)  11/20/22 251 lb (113.9 kg)  08/21/22 247 lb (112 kg)    Physical Exam  General appearance - alert, well  appearing, and in no distress Mental status - normal mood, behavior, speech, dress, motor activity, and thought processes Neck - supple, no significant adenopathy Chest - clear to auscultation, no wheezes, rales or rhonchi, symmetric air entry Heart - normal rate, regular rhythm, normal S1, S2, no murmurs, rubs, clicks or gallops Musculoskeletal -knees: Large body habitus.  No point tenderness.  Good passive range of motion with mild clicking heard Extremities - peripheral pulses normal, no pedal edema, no clubbing or cyanosis      Latest Ref Rng & Units 04/23/2022   12:41 AM 04/21/2022   12:23 AM 04/20/2022    5:53 AM  CMP  Glucose 70 - 99 mg/dL 409  112  120   BUN 6 - 20 mg/dL 10  <5  <5   Creatinine 0.44 - 1.00 mg/dL 5.40  9.81  1.91   Sodium 135 - 145 mmol/L 140  141  141   Potassium 3.5 - 5.1 mmol/L 4.1  3.9  3.9   Chloride 98 - 111 mmol/L 103  102  102   CO2 22 - 32 mmol/L 32  32  33   Calcium 8.9 - 10.3 mg/dL 8.9  8.8  8.5   Total Protein 6.5 - 8.1 g/dL 6.4   7.0   Total Bilirubin 0.3 - 1.2 mg/dL 0.8   0.9   Alkaline Phos 38 - 126 U/L 114   113   AST 15 - 41 U/L 15   29   ALT 0 - 44 U/L 26   47    Lipid Panel     Component Value Date/Time   CHOL 276 (H) 11/24/2020 1102   TRIG 298 (H) 11/24/2020 1102   HDL 41 11/24/2020 1102   CHOLHDL 6.7 (H) 11/24/2020 1102   LDLCALC 177 (H) 11/24/2020 1102    CBC    Component Value Date/Time   WBC 6.7 04/23/2022 0041   RBC 3.99 04/23/2022 0041   HGB 12.8 04/23/2022 0041   HGB 14.8 11/24/2020 1102   HCT 39.9 04/23/2022 0041   HCT 44.8 11/24/2020 1102   PLT 204 04/23/2022 0041   PLT 221 11/24/2020 1102   MCV 100.0 04/23/2022 0041   MCV 96 11/24/2020 1102   MCH 32.1 04/23/2022 0041   MCHC 32.1 04/23/2022 0041   RDW 12.6 04/23/2022 0041   RDW 12.1 11/24/2020 1102   LYMPHSABS 1.7 04/23/2022 0041   MONOABS 0.7 04/23/2022 0041   EOSABS 0.3 04/23/2022 0041   BASOSABS 0.1 04/23/2022 0041    ASSESSMENT AND PLAN:  1. Morbid  obesity (HCC) Discussed trying her with low-dose phentermine to help curb appetite and bring about weight loss.  Blood pressure is good.  Patient advised that if she develops any palpitations/heart racing on the medicine, she should stop the medication.  Will bring her back in about 1 month to see how she is tolerating the medicine. We started her on phentermine 15 mg daily. Encouraged her to continue to walk and try to increase to 3 days a week. - CBC - Comprehensive metabolic panel  2. Prediabetes See #1 above - POCT glucose (manual entry) - POCT glycosylated hemoglobin (Hb A1C)  3. Primary osteoarthritis of both knees [place] importance of weight loss to help get the mechanical strain off her knees.  Will get some baseline x-rays.  She can continue taking the Tylenol and using Voltaren gel for now. - DG Knee Complete 4 Views Left; Future - DG Knee Complete 4 Views Right; Future  4. Chronic diastolic heart failure (HCC) Compensated. Advised to use furosemide only if she develops increased shortness of breath or lower extremity edema. - furosemide (LASIX) 20 MG tablet; Take 1-2 tablets (20-40 mg total) by mouth daily as needed for swelling.  Dispense: 60 tablet; Refill: 6  5. Mixed hyperlipidemia Refill sent to the pharmacy on atorvastatin - Lipid panel - atorvastatin (LIPITOR) 10 MG tablet; Take 1 tablet (10 mg total) by mouth daily.  Dispense: 90 tablet; Refill: 2  6. Asthma in adult, mild persistent, uncomplicated Continue Breo. - fluticasone furoate-vilanterol (BREO ELLIPTA) 100-25 MCG/ACT AEPB; Inhale 1 puff into the lungs daily.  Dispense: 60 each; Refill: 6  7. Obesity hypoventilation syndrome (HCC) Continue  BiPAP  8. Need for shingles vaccine Printed prescription given for her to get her second Shingrix vaccine at the pharmacy.    Patient was given the opportunity to ask questions.  Patient verbalized understanding of the plan and was able to repeat key elements of the  plan.   This documentation was completed using Paediatric nurse.  Any transcriptional errors are unintentional.  Orders Placed This Encounter  Procedures   DG Knee Complete 4 Views Left   DG Knee Complete 4 Views Right   CBC   Comprehensive metabolic panel   Lipid panel   POCT glucose (manual entry)   POCT glycosylated hemoglobin (Hb A1C)     Requested Prescriptions   Signed Prescriptions Disp Refills   furosemide (LASIX) 20 MG tablet 60 tablet 6    Sig: Take 1-2 tablets (20-40 mg total) by mouth daily as needed for swelling.   atorvastatin (LIPITOR) 10 MG tablet 90 tablet 2    Sig: Take 1 tablet (10 mg total) by mouth daily.   fluticasone furoate-vilanterol (BREO ELLIPTA) 100-25 MCG/ACT AEPB 60 each 6    Sig: Inhale 1 puff into the lungs daily.   albuterol (VENTOLIN HFA) 108 (90 Base) MCG/ACT inhaler 6.7 g 1    Sig: INHALE 2 PUFFS INTO THE LUNGS EVERY 4 (FOUR) HOURS AS NEEDED FOR WHEEZING OR SHORTNESS OF BREATH.   Zoster Vaccine Adjuvanted [facility]) injection 0.5 mL 0    Sig: Inject 0.5 mLs into the muscle once for 1 dose.   phentermine 15 MG capsule 30 capsule 1    Sig: Take 1 capsule (15 mg total) by mouth every morning.    Return in about 4 weeks (around 05/09/2023) for f/u obesity.  Jonah Blue, MD, FACP

## 2023-04-12 ENCOUNTER — Ambulatory Visit (HOSPITAL_COMMUNITY)
Admission: RE | Admit: 2023-04-12 | Discharge: 2023-04-12 | Disposition: A | Discharge status: Home / Self Care | Payer: Self-pay | Source: Ambulatory Visit | Attending: Internal Medicine | Admitting: Internal Medicine

## 2023-04-12 ENCOUNTER — Other Ambulatory Visit: Payer: Self-pay

## 2023-04-12 DIAGNOSIS — M170 Bilateral primary osteoarthritis of knee: Secondary | ICD-10-CM | POA: Insufficient documentation

## 2023-04-12 MED ORDER — ZOSTER VAC RECOMB ADJUVANTED 50 MCG/0.5ML IM SUSR
0.5000 mL | Freq: Once | INTRAMUSCULAR | 0 refills | Status: AC
  Filled 2023-04-12: qty 0.5, 1d supply, fill #0

## 2023-04-18 ENCOUNTER — Other Ambulatory Visit: Payer: Self-pay

## 2023-04-19 ENCOUNTER — Other Ambulatory Visit: Payer: Self-pay

## 2023-04-22 ENCOUNTER — Telehealth: Payer: Self-pay

## 2023-04-22 NOTE — Telephone Encounter (Signed)
Call from pt for lab results - at transfer interpreter was lost.  [facility] and was connected to another interpreter. Unable to conference in interpreter after several tries.  Disconnected. Pt will need to be called back with interpreter on phone.  Called again - call went to voice mail. Voice mail not set up.

## 2023-04-24 ENCOUNTER — Telehealth: Payer: Self-pay | Admitting: Internal Medicine

## 2023-04-24 NOTE — Telephone Encounter (Signed)
Please  mail patient an application.

## 2023-04-24 NOTE — Telephone Encounter (Signed)
Pt wants to know if she can have an application for an orange card mailed to her. Please follow up with pt.

## 2023-04-30 NOTE — Telephone Encounter (Signed)
Application placed in mail today

## 2023-04-30 NOTE — Telephone Encounter (Signed)
Pt called in checking on renewal of orange care. She says she hasn't received the application yet

## 2023-05-02 ENCOUNTER — Telehealth: Payer: Self-pay

## 2023-05-02 NOTE — Telephone Encounter (Signed)
Pt called for x-ray results. Shared provider's note.  Marcine Matar, MD 04/21/2023  3:12 PM EDT     Let patient know that the x-rays of both knees confirm significant arthritis.  Weight loss is very important to help get some of the mechanical strain off the knees.  Please apply for the orange card/[facility] discount so that we can refer to orthopedics for steroid injection to the knees.   Pt would like to start injections for knee pain. Pt states that she need form/paperwork sent to her to apply for [facility] discount/ Orange card.

## 2023-05-02 NOTE — Telephone Encounter (Signed)
Contacted Jenene Slicker, Patient Artist, [employer]. He has agreed to follow-up with patient regarding their [employer] and [employer].

## 2023-05-07 ENCOUNTER — Ambulatory Visit: Payer: Self-pay

## 2023-05-09 ENCOUNTER — Other Ambulatory Visit: Payer: Self-pay

## 2023-05-09 ENCOUNTER — Encounter: Payer: Self-pay | Admitting: Internal Medicine

## 2023-05-09 ENCOUNTER — Ambulatory Visit: Payer: Self-pay | Attending: Internal Medicine | Admitting: Internal Medicine

## 2023-05-09 ENCOUNTER — Ambulatory Visit: Payer: Self-pay | Admitting: Internal Medicine

## 2023-05-09 MED ORDER — PHENTERMINE HCL 30 MG PO CAPS
30.0000 mg | ORAL_CAPSULE | ORAL | 1 refills | Status: DC
  Filled 2023-05-09: qty 30, 30d supply, fill #0

## 2023-05-09 NOTE — Progress Notes (Signed)
Patient ID: Veronica Castaneda, female    DOB: 1961/08/05  MRN: [id number]  CC: Medication Refill (Phentermine medication f/u. Nicki Reaper phentermine is going well but feels that there is no weight loss)   Subjective: Veronica Castaneda is a 62 y.o. female who presents for 1 mth f/u obesity Her concerns today include:  Pt with preDM/morbid obesity, OA LT knee, mixed HL, persistent asthma, hypoventilation syndrome with likely OSA on BiPAP, chronic hypoxia on home O2    [employer] Language interpreter used during this encounter. #[id number], Raul  Pt presents for 1 mth f/u on obesity Started on Phentermine on last visit and tolerating it.  No wgh loss so far.  She is trying her best with her eating habits. Patient Active Problem List   Diagnosis Date Noted   Chronic diastolic heart failure (HCC) 11/20/2022   Chronic right-sided heart failure (HCC) 05/18/2022   Asthma in adult, mild persistent, uncomplicated 05/18/2022   Chronic respiratory failure with hypoxia, on home oxygen therapy (HCC) 05/18/2022   CAP (community acquired pneumonia) 04/20/2022   Abnormal LFTs 04/20/2022   Ventral hernia without obstruction or gangrene    Umbilical hernia without obstruction and without gangrene    Hyperventilation syndrome 11/24/2020   Asthma exacerbation 05/22/2020   Morbid obesity with body mass index (BMI) of 50.0 to 59.9 in adult (HCC) 05/22/2020   Obesity hypoventilation syndrome (HCC) 05/22/2020   OSA (obstructive sleep apnea) 05/22/2020   RSV (respiratory syncytial virus infection) 05/22/2020   Difficult airway for intubation 05/22/2020   Acute respiratory failure with hypoxia (HCC) 05/12/2020   Loud snoring 02/11/2020   Daytime sleepiness 02/11/2020   Screening breast examination 10/22/2019   Prediabetes 08/18/2019   Hyperlipidemia 08/18/2019   Morbid obesity (HCC) 08/17/2019     Current Outpatient Medications on File Prior to Visit  Medication Sig Dispense Refill   albuterol  (VENTOLIN HFA) 108 (90 Base) MCG/ACT inhaler INHALE 2 PUFFS INTO THE LUNGS EVERY 4 (FOUR) HOURS AS NEEDED FOR WHEEZING OR SHORTNESS OF BREATH. 6.7 g 1   atorvastatin (LIPITOR) 10 MG tablet Take 1 tablet (10 mg total) by mouth daily. 90 tablet 2   diclofenac Sodium (VOLTAREN) 1 % GEL Apply 2 g topically 4 (four) times daily. 100 g 1   fluticasone furoate-vilanterol (BREO ELLIPTA) 100-25 MCG/ACT AEPB Inhale 1 puff into the lungs daily. 60 each 6   metFORMIN (GLUCOPHAGE) 500 MG tablet Take 0.5 tablets (250 mg total) by mouth daily with breakfast. 45 tablet 2   furosemide (LASIX) 20 MG tablet Take 1-2 tablets (20-40 mg total) by mouth daily as needed for swelling. (Patient not taking: Reported on 05/09/2023) 60 tablet 6   gabapentin (NEURONTIN) 300 MG capsule Take 1 capsule (300 mg total) by mouth 3 (three) times daily. (Patient not taking: Reported on 05/09/2023) 90 capsule 3   meloxicam (MOBIC) 15 MG tablet Take 1 tablet (15 mg total) by mouth daily. (Patient not taking: Reported on 05/09/2023) 30 tablet 2   No current facility-administered medications on file prior to visit.    No Known Allergies  Social History   Socioeconomic History   Marital status: Single    Spouse name: Not on file   Number of children: 3   Years of education: Not on file   Highest education level: 2nd grade  Occupational History   Occupation: house wife  Tobacco Use   Smoking status: Never   Smokeless tobacco: Never  Vaping Use   Vaping status: Never Used  Substance  and Sexual Activity   Alcohol use: Never   Drug use: Never   Sexual activity: Yes    Birth control/protection: None  Other Topics Concern   Not on file  Social History Narrative   Not on file   Social Determinants of Health   Financial Resource Strain: Not on file  Food Insecurity: No Food Insecurity (08/21/2022)   Hunger Vital Sign    Worried About Running Out of Food in the Last Year: Never true    Ran Out of Food in the Last Year: Never  true  Transportation Needs: No Transportation Needs (08/21/2022)   PRAPARE - Administrator, Civil Service (Medical): No    Lack of Transportation (Non-Medical): No  Physical Activity: Not on file  Stress: Not on file  Social Connections: Not on file  Intimate Partner Violence: Not on file    Family History  Problem Relation Age of Onset   Heart attack Mother    Heart disease Mother    Breast cancer Neg Hx     Past Surgical History:  Procedure Laterality Date   INSERTION OF MESH  06/15/2021   Procedure: INSERTION OF MESH;  Surgeon: Henrene Dodge, MD;  Location: [facility];  Service: General;;   NO PAST SURGERIES     No PAST SURGICAL HISTORY     XI ROBOTIC ASSISTED VENTRAL HERNIA N/A 06/15/2021   Procedure: XI ROBOTIC ASSISTED VENTRAL HERNIA;  Surgeon: Henrene Dodge, MD;  Location: [facility] OR[facility];  Service: General;  Laterality: N/A;    ROS: Review of Systems Negative except as stated above  PHYSICAL EXAM: BP 97/61 (BP Location: Left Arm, Patient Position: Sitting, Cuff Size: Large)   Pulse (!) 45   Ht 5\' 4"  (1.626 m)   Wt 249 lb (112.9 kg)   SpO2 93%   BMI 42.74 kg/m   Wt Readings from Last 3 Encounters:  05/09/23 249 lb (112.9 kg)  04/11/23 250 lb (113.4 kg)  11/20/22 251 lb (113.9 kg)    Physical Exam  General appearance - alert, well appearing, and in no distress Mental status - normal mood, behavior, speech, dress, motor activity, and thought processes      Latest Ref Rng & Units 04/23/2022   12:41 AM 04/21/2022   12:23 AM 04/20/2022    5:53 AM  CMP  Glucose 70 - 99 mg/dL 027  253  664   BUN 6 - 20 mg/dL 10  <5  <5   Creatinine 0.44 - 1.00 mg/dL 4.03  4.74  2.59   Sodium 135 - 145 mmol/L 140  141  141   Potassium 3.5 - 5.1 mmol/L 4.1  3.9  3.9   Chloride 98 - 111 mmol/L 103  102  102   CO2 22 - 32 mmol/L 32  32  33   Calcium 8.9 - 10.3 mg/dL 8.9  8.8  8.5   Total Protein 6.5 - 8.1 g/dL 6.4   7.0   Total Bilirubin 0.3 - 1.2 mg/dL 0.8   0.9    Alkaline Phos 38 - 126 U/L 114   113   AST 15 - 41 U/L 15   29   ALT 0 - 44 U/L 26   47    Lipid Panel     Component Value Date/Time   CHOL 276 (H) 11/24/2020 1102   TRIG 298 (H) 11/24/2020 1102   HDL 41 11/24/2020 1102   CHOLHDL 6.7 (H) 11/24/2020 1102   LDLCALC 177 (H) 11/24/2020 1102  CBC    Component Value Date/Time   WBC 6.7 04/23/2022 0041   RBC 3.99 04/23/2022 0041   HGB 12.8 04/23/2022 0041   HGB 14.8 11/24/2020 1102   HCT 39.9 04/23/2022 0041   HCT 44.8 11/24/2020 1102   PLT 204 04/23/2022 0041   PLT 221 11/24/2020 1102   MCV 100.0 04/23/2022 0041   MCV 96 11/24/2020 1102   MCH 32.1 04/23/2022 0041   MCHC 32.1 04/23/2022 0041   RDW 12.6 04/23/2022 0041   RDW 12.1 11/24/2020 1102   LYMPHSABS 1.7 04/23/2022 0041   MONOABS 0.7 04/23/2022 0041   EOSABS 0.3 04/23/2022 0041   BASOSABS 0.1 04/23/2022 0041    ASSESSMENT AND PLAN: 1. Morbid obesity (HCC) Continue to encourage healthy eating habits. So far she has tolerated the 15 mg of phentermine without any major side effects.  She denies any palpitations.  I recommend trying on increased dose of 30 mg daily.  Advised patient that if she develops any palpitations or any concerning side effects, she should stop the medicine and give me a call.  She expressed understanding. - phentermine 30 MG capsule; Take 1 capsule (30 mg total) by mouth every morning. Dose increase  Dispense: 30 capsule; Refill: 1    Patient was given the opportunity to ask questions.  Patient verbalized understanding of the plan and was able to repeat key elements of the plan.   This documentation was completed using Paediatric nurse.  Any transcriptional errors are unintentional.  No orders of the defined types were placed in this encounter.    Requested Prescriptions   Signed Prescriptions Disp Refills   phentermine 30 MG capsule 30 capsule 1    Sig: Take 1 capsule (30 mg total) by mouth every morning. Dose  increase    Return in about 2 years (around 05/08/2025).  Jonah Blue, MD, FACP

## 2023-05-10 ENCOUNTER — Other Ambulatory Visit: Payer: Self-pay

## 2023-07-23 ENCOUNTER — Other Ambulatory Visit: Payer: Self-pay

## 2023-07-23 ENCOUNTER — Ambulatory Visit: Payer: Self-pay | Attending: Internal Medicine | Admitting: Internal Medicine

## 2023-07-23 ENCOUNTER — Encounter: Payer: Self-pay | Admitting: Internal Medicine

## 2023-07-23 DIAGNOSIS — Z6841 Body Mass Index (BMI) 40.0 and over, adult: Secondary | ICD-10-CM

## 2023-07-23 DIAGNOSIS — E782 Mixed hyperlipidemia: Secondary | ICD-10-CM

## 2023-07-23 DIAGNOSIS — Z7984 Long term (current) use of oral hypoglycemic drugs: Secondary | ICD-10-CM

## 2023-07-23 DIAGNOSIS — Z2821 Immunization not carried out because of patient refusal: Secondary | ICD-10-CM

## 2023-07-23 DIAGNOSIS — E662 Morbid (severe) obesity with alveolar hypoventilation: Secondary | ICD-10-CM

## 2023-07-23 DIAGNOSIS — Z9981 Dependence on supplemental oxygen: Secondary | ICD-10-CM

## 2023-07-23 DIAGNOSIS — J9611 Chronic respiratory failure with hypoxia: Secondary | ICD-10-CM

## 2023-07-23 MED ORDER — PHENTERMINE HCL 30 MG PO CAPS
30.0000 mg | ORAL_CAPSULE | ORAL | 2 refills | Status: DC
  Filled 2023-07-23: qty 30, 30d supply, fill #0

## 2023-07-23 NOTE — Progress Notes (Signed)
Patient ID: Veronica Castaneda, female    DOB: [birth year]  MRN: [id number]  CC: Obesity (Obsesity f/u. Med refill. Nicki Reaper that phentermine is helping a little/No to flu vax. )   Subjective: Veronica Castaneda is a 62 y.o. female who presents for chronic ds management. Her concerns today include:  Pt with preDM/morbid obesity, OA LT knee, mixed HL, persistent asthma, hypoventilation syndrome with likely OSA on BiPAP, chronic hypoxia on home O2   [employer] Language interpreter used during this encounter. #Fernando [id number]  Obesity/PreDM:  loss 12 lbs since July 2024 since being on the Phentermine.  Out of the med x 5 days.  Reports she is tolerating the med okay. Reports compliance with Lipitor and Metformin.  Feels she is eating less.  Walks on her block every evening.    OSA/chronic hypoxia:  reports she uses oxygen and BiPAP at nights consistently.  Wakes up feeling refresh  HM:  declines flu shot today Patient Active Problem List   Diagnosis Date Noted   Chronic diastolic heart failure (HCC) 11/20/2022   Chronic right-sided heart failure (HCC) 05/18/2022   Asthma in adult, mild persistent, uncomplicated 05/18/2022   Chronic respiratory failure with hypoxia, on home oxygen therapy (HCC) 05/18/2022   CAP (community acquired pneumonia) 04/20/2022   Abnormal LFTs 04/20/2022   Ventral hernia without obstruction or gangrene    Umbilical hernia without obstruction and without gangrene    Hyperventilation syndrome 11/24/2020   Asthma exacerbation 05/22/2020   Morbid obesity with body mass index (BMI) of 50.0 to 59.9 in adult (HCC) 05/22/2020   Obesity hypoventilation syndrome (HCC) 05/22/2020   OSA (obstructive sleep apnea) 05/22/2020   RSV (respiratory syncytial virus infection) 05/22/2020   Difficult airway for intubation 05/22/2020   Acute respiratory failure with hypoxia (HCC) 05/12/2020   Loud snoring 02/11/2020   Daytime sleepiness 02/11/2020   Screening breast  examination 10/22/2019   Prediabetes 08/18/2019   Hyperlipidemia 08/18/2019   Morbid obesity (HCC) 08/17/2019     Current Outpatient Medications on File Prior to Visit  Medication Sig Dispense Refill   albuterol (VENTOLIN HFA) 108 (90 Base) MCG/ACT inhaler INHALE 2 PUFFS INTO THE LUNGS EVERY 4 (FOUR) HOURS AS NEEDED FOR WHEEZING OR SHORTNESS OF BREATH. 6.7 g 1   atorvastatin (LIPITOR) 10 MG tablet Take 1 tablet (10 mg total) by mouth daily. 90 tablet 2   diclofenac Sodium (VOLTAREN) 1 % GEL Apply 2 g topically 4 (four) times daily. 100 g 1   furosemide (LASIX) 20 MG tablet Take 1-2 tablets (20-40 mg total) by mouth daily as needed for swelling. 60 tablet 6   phentermine 30 MG capsule Take 1 capsule (30 mg total) by mouth every morning. Dose increase 30 capsule 1   fluticasone furoate-vilanterol (BREO ELLIPTA) 100-25 MCG/ACT AEPB Inhale 1 puff into the lungs daily. (Patient not taking: Reported on 07/23/2023) 60 each 6   gabapentin (NEURONTIN) 300 MG capsule Take 1 capsule (300 mg total) by mouth 3 (three) times daily. (Patient not taking: Reported on 05/09/2023) 90 capsule 3   meloxicam (MOBIC) 15 MG tablet Take 1 tablet (15 mg total) by mouth daily. (Patient not taking: Reported on 05/09/2023) 30 tablet 2   metFORMIN (GLUCOPHAGE) 500 MG tablet Take 0.5 tablets (250 mg total) by mouth daily with breakfast. (Patient not taking: Reported on 07/23/2023) 45 tablet 2   No current facility-administered medications on file prior to visit.    No Known Allergies  Social History   Socioeconomic History  Marital status: Single    Spouse name: Not on file   Number of children: 3   Years of education: Not on file   Highest education level: 2nd grade  Occupational History   Occupation: house wife  Tobacco Use   Smoking status: Never   Smokeless tobacco: Never  Vaping Use   Vaping status: Never Used  Substance and Sexual Activity   Alcohol use: Never   Drug use: Never   Sexual activity: Yes     Birth control/protection: None  Other Topics Concern   Not on file  Social History Narrative   Not on file   Social Determinants of Health   Financial Resource Strain: Not on file  Food Insecurity: Food Insecurity Present (07/23/2023)   Hunger Vital Sign    Worried About Running Out of Food in the Last Year: Never true    Ran Out of Food in the Last Year: Sometimes true  Transportation Needs: No Transportation Needs (07/23/2023)   PRAPARE - Administrator, Civil Service (Medical): No    Lack of Transportation (Non-Medical): No  Physical Activity: Not on file  Stress: Not on file  Social Connections: Not on file  Intimate Partner Violence: Not At Risk (07/23/2023)   Humiliation, Afraid, Rape, and Kick questionnaire    Fear of Current or Ex-Partner: No    Emotionally Abused: No    Physically Abused: No    Sexually Abused: No    Family History  Problem Relation Age of Onset   Heart attack Mother    Heart disease Mother    Breast cancer Neg Hx     Past Surgical History:  Procedure Laterality Date   INSERTION OF MESH  06/15/2021   Procedure: INSERTION OF MESH;  Surgeon: Henrene Dodge, MD;  Location: [facility];  Service: General;;   NO PAST SURGERIES     No PAST SURGICAL HISTORY     XI ROBOTIC ASSISTED VENTRAL HERNIA N/A 06/15/2021   Procedure: XI ROBOTIC ASSISTED VENTRAL HERNIA;  Surgeon: Henrene Dodge, MD;  Location: [facility];  Service: General;  Laterality: N/A;    ROS: Review of Systems Negative except as stated above  PHYSICAL EXAM: BP 110/67 (BP Location: Left Arm, Patient Position: Sitting, Cuff Size: Normal)   Pulse 69   Temp 98.8 F (37.1 C) (Oral)   Ht 5\' 4"  (1.626 m)   Wt 238 lb (108 kg)   SpO2 95%   BMI 40.85 kg/m   Wt Readings from Last 3 Encounters:  07/23/23 238 lb (108 kg)  05/09/23 249 lb (112.9 kg)  04/11/23 250 lb (113.4 kg)    Physical Exam  General appearance - alert, well appearing, morbidly obese older Hispanic female and  in no distress Mental status - normal mood, behavior, speech, dress, motor activity, and thought processes Chest - clear to auscultation, no wheezes, rales or rhonchi, symmetric air entry Heart - normal rate, regular rhythm, normal S1, S2, no murmurs, rubs, clicks or gallops Extremities - no LE edema      Latest Ref Rng & Units 04/23/2022   12:41 AM 04/21/2022   12:23 AM 04/20/2022    5:53 AM  CMP  Glucose 70 - 99 mg/dL 161  096  045   BUN 6 - 20 mg/dL 10  <5  <5   Creatinine 0.44 - 1.00 mg/dL 4.09  8.11  9.14   Sodium 135 - 145 mmol/L 140  141  141   Potassium 3.5 - 5.1 mmol/L 4.1  3.9  3.9   Chloride 98 - 111 mmol/L 103  102  102   CO2 22 - 32 mmol/L 32  32  33   Calcium 8.9 - 10.3 mg/dL 8.9  8.8  8.5   Total Protein 6.5 - 8.1 g/dL 6.4   7.0   Total Bilirubin 0.3 - 1.2 mg/dL 0.8   0.9   Alkaline Phos 38 - 126 U/L 114   113   AST 15 - 41 U/L 15   29   ALT 0 - 44 U/L 26   47    Lipid Panel     Component Value Date/Time   CHOL 276 (H) 11/24/2020 1102   TRIG 298 (H) 11/24/2020 1102   HDL 41 11/24/2020 1102   CHOLHDL 6.7 (H) 11/24/2020 1102   LDLCALC 177 (H) 11/24/2020 1102    CBC    Component Value Date/Time   WBC 6.7 04/23/2022 0041   RBC 3.99 04/23/2022 0041   HGB 12.8 04/23/2022 0041   HGB 14.8 11/24/2020 1102   HCT 39.9 04/23/2022 0041   HCT 44.8 11/24/2020 1102   PLT 204 04/23/2022 0041   PLT 221 11/24/2020 1102   MCV 100.0 04/23/2022 0041   MCV 96 11/24/2020 1102   MCH 32.1 04/23/2022 0041   MCHC 32.1 04/23/2022 0041   RDW 12.6 04/23/2022 0041   RDW 12.1 11/24/2020 1102   LYMPHSABS 1.7 04/23/2022 0041   MONOABS 0.7 04/23/2022 0041   EOSABS 0.3 04/23/2022 0041   BASOSABS 0.1 04/23/2022 0041    ASSESSMENT AND PLAN: 1. Morbid obesity (HCC) Commended her on weight loss so far.  Refill sent on phentermine.  We will keep on the 30 mg dose for now.  Encourage healthy eating habits.  Encouraged her to continue to walk in her neighborhood daily. - phentermine 30  MG capsule; Take 1 capsule (30 mg total) by mouth every morning. Dose increase  Dispense: 30 capsule; Refill: 2 - CBC; Future - Comprehensive metabolic panel; Future - Lipid panel; Future  2. Mixed hyperlipidemia Continue Crestor.  3. Chronic respiratory failure with hypoxia, on home oxygen therapy (HCC) 4. Obesity hypoventilation syndrome (HCC) Continue using her BiPAP machine and oxygen at night.  She is benefiting from use.  5. Influenza vaccination declined   Patient was given the opportunity to ask questions.  Patient verbalized understanding of the plan and was able to repeat key elements of the plan.   This documentation was completed using Paediatric nurse.  Any transcriptional errors are unintentional.  No orders of the defined types were placed in this encounter.    Requested Prescriptions   Pending Prescriptions Disp Refills   phentermine 30 MG capsule 30 capsule 1    Sig: Take 1 capsule (30 mg total) by mouth every morning. Dose increase    No follow-ups on file.  Jonah Blue, MD, FACP

## 2023-07-24 ENCOUNTER — Other Ambulatory Visit: Payer: Self-pay

## 2023-07-25 ENCOUNTER — Other Ambulatory Visit: Payer: Self-pay

## 2023-07-25 MED ORDER — INFLUENZA VIRUS VACC SPLIT PF (FLUZONE) 0.5 ML IM SUSY
0.5000 mL | PREFILLED_SYRINGE | Freq: Once | INTRAMUSCULAR | 0 refills | Status: AC
  Filled 2023-07-25: qty 0.5, 1d supply, fill #0

## 2023-08-26 ENCOUNTER — Other Ambulatory Visit: Payer: Self-pay

## 2023-10-17 ENCOUNTER — Telehealth: Payer: Self-pay | Admitting: Internal Medicine

## 2023-10-17 NOTE — Telephone Encounter (Signed)
Copied from CRM [id number]. Topic: Clinical - Pink Word Triage >> Oct 17, 2023  4:20 PM Phill Myron wrote: Reason for Triage:  Bipap machine issues, only need it at night,  but it starts whistling and I have to turn it off.    Patient had no information on her machine but I looked into her chart December 30.2022  and it instructed her to call [facility] office # [phone] ( I did give her that number too)

## 2023-10-23 NOTE — Telephone Encounter (Signed)
I spoke to Cherry/ [facility] and she said I need to speak to the [facility] branch: [phone].  She tried to connect me but that branch was closed. Will need to try tomorrow. I see that the patient has an appointment at [facility] tomorrow and I will try to speak to her then

## 2023-10-24 ENCOUNTER — Encounter: Payer: Self-pay | Admitting: Internal Medicine

## 2023-10-24 ENCOUNTER — Ambulatory Visit: Payer: Self-pay | Attending: Internal Medicine | Admitting: Internal Medicine

## 2023-10-24 ENCOUNTER — Other Ambulatory Visit: Payer: Self-pay

## 2023-10-24 VITALS — BP 119/71 | HR 67 | Temp 98.0°F | Resp 20 | Ht 59.0 in | Wt 240.0 lb

## 2023-10-24 DIAGNOSIS — G4733 Obstructive sleep apnea (adult) (pediatric): Secondary | ICD-10-CM

## 2023-10-24 DIAGNOSIS — E782 Mixed hyperlipidemia: Secondary | ICD-10-CM

## 2023-10-24 DIAGNOSIS — R7303 Prediabetes: Secondary | ICD-10-CM

## 2023-10-24 DIAGNOSIS — Z6841 Body Mass Index (BMI) 40.0 and over, adult: Secondary | ICD-10-CM

## 2023-10-24 DIAGNOSIS — Z1211 Encounter for screening for malignant neoplasm of colon: Secondary | ICD-10-CM

## 2023-10-24 DIAGNOSIS — Z23 Encounter for immunization: Secondary | ICD-10-CM

## 2023-10-24 LAB — POCT GLYCOSYLATED HEMOGLOBIN (HGB A1C): HbA1c, POC (controlled diabetic range): 5.7 % (ref 0.0–7.0)

## 2023-10-24 MED ORDER — PHENTERMINE HCL 37.5 MG PO CAPS
37.5000 mg | ORAL_CAPSULE | ORAL | 1 refills | Status: DC
  Filled 2023-10-24: qty 30, 30d supply, fill #0

## 2023-10-24 NOTE — Telephone Encounter (Signed)
I called [facility]: [phone] and the office was closed again

## 2023-10-24 NOTE — Progress Notes (Signed)
Patient ID: Veronica Castaneda, female    DOB: 25-Jan-1961  MRN: [id number]  CC: Chronic disease management   Subjective: Veronica Castaneda is a 63 y.o. female who presents for chronic ds management. Her concerns today include:  Pt with preDM/morbid obesity, OA LT knee, mixed HL, persistent asthma, hypoventilation syndrome with likely OSA on BiPAP, chronic hypoxia on home O2   [employer] Language interpreter used during this encounter. #Luis [id number]  Obesity/PreDM: gained 2 lbs since last visit 3 mths ago. On last visit she was down 12 lbs from 03/2023.  Still on and tolerating Phentermine and feels it has help in decreasing appetite. Over ate during the holidays but now back on track. Walking 15 mins daily  OSA/chronic hypoxia:  using her BiPAP but not the O2 because it causes her BiPAP machine to beep when she connects it to the device.  Our caseworker will be calling [facility] to see if they can assist in troubleshooting the device. Patient reports waking up in the mornings feeling refreshed.  No daytime sleepiness.  HL taking and tolerating Atorvastating  HM:  yes to PCV vaccine Patient Active Problem List   Diagnosis Date Noted   Chronic diastolic heart failure (HCC) 11/20/2022   Chronic right-sided heart failure (HCC) 05/18/2022   Asthma in adult, mild persistent, uncomplicated 05/18/2022   Chronic respiratory failure with hypoxia, on home oxygen therapy (HCC) 05/18/2022   CAP (community acquired pneumonia) 04/20/2022   Abnormal LFTs 04/20/2022   Ventral hernia without obstruction or gangrene    Umbilical hernia without obstruction and without gangrene    Hyperventilation syndrome 11/24/2020   Asthma exacerbation 05/22/2020   Morbid obesity with body mass index (BMI) of 50.0 to 59.9 in adult (HCC) 05/22/2020   Obesity hypoventilation syndrome (HCC) 05/22/2020   OSA (obstructive sleep apnea) 05/22/2020   RSV (respiratory syncytial virus infection) 05/22/2020    Difficult airway for intubation 05/22/2020   Acute respiratory failure with hypoxia (HCC) 05/12/2020   Loud snoring 02/11/2020   Daytime sleepiness 02/11/2020   Screening breast examination 10/22/2019   Prediabetes 08/18/2019   Hyperlipidemia 08/18/2019   Morbid obesity (HCC) 08/17/2019     Current Outpatient Medications on File Prior to Visit  Medication Sig Dispense Refill   albuterol (VENTOLIN HFA) 108 (90 Base) MCG/ACT inhaler INHALE 2 PUFFS INTO THE LUNGS EVERY 4 (FOUR) HOURS AS NEEDED FOR WHEEZING OR SHORTNESS OF BREATH. 6.7 g 1   atorvastatin (LIPITOR) 10 MG tablet Take 1 tablet (10 mg total) by mouth daily. 90 tablet 2   diclofenac Sodium (VOLTAREN) 1 % GEL Apply 2 g topically 4 (four) times daily. 100 g 1   fluticasone furoate-vilanterol (BREO ELLIPTA) 100-25 MCG/ACT AEPB Inhale 1 puff into the lungs daily. 60 each 6   furosemide (LASIX) 20 MG tablet Take 1-2 tablets (20-40 mg total) by mouth daily as needed for swelling. 60 tablet 6   metFORMIN (GLUCOPHAGE) 500 MG tablet Take 0.5 tablets (250 mg total) by mouth daily with breakfast. 45 tablet 2   No current facility-administered medications on file prior to visit.    No Known Allergies  Social History   Socioeconomic History   Marital status: Single    Spouse name: Not on file   Number of children: 3   Years of education: Not on file   Highest education level: 2nd grade  Occupational History   Occupation: house wife  Tobacco Use   Smoking status: Never   Smokeless tobacco: Never  Vaping  Use   Vaping status: Never Used  Substance and Sexual Activity   Alcohol use: Never   Drug use: Never   Sexual activity: Yes    Birth control/protection: None  Other Topics Concern   Not on file  Social History Narrative   Not on file   Social Drivers of Health   Financial Resource Strain: Not on file  Food Insecurity: Food Insecurity Present (07/23/2023)   Hunger Vital Sign    Worried About Running Out of Food in the  Last Year: Never true    Ran Out of Food in the Last Year: Sometimes true  Transportation Needs: No Transportation Needs (07/23/2023)   PRAPARE - Administrator, Civil Service (Medical): No    Lack of Transportation (Non-Medical): No  Physical Activity: Not on file  Stress: Not on file  Social Connections: Not on file  Intimate Partner Violence: Not At Risk (07/23/2023)   Humiliation, Afraid, Rape, and Kick questionnaire    Fear of Current or Ex-Partner: No    Emotionally Abused: No    Physically Abused: No    Sexually Abused: No    Family History  Problem Relation Age of Onset   Heart attack Mother    Heart disease Mother    Breast cancer Neg Hx     Past Surgical History:  Procedure Laterality Date   INSERTION OF MESH  06/15/2021   Procedure: INSERTION OF MESH;  Surgeon: Henrene Dodge, MD;  Location: [facility] ORS;  Service: General;;   NO PAST SURGERIES     No PAST SURGICAL HISTORY     XI ROBOTIC ASSISTED VENTRAL HERNIA N/A 06/15/2021   Procedure: XI ROBOTIC ASSISTED VENTRAL HERNIA;  Surgeon: Henrene Dodge, MD;  Location: [facility] ORS;  Service: General;  Laterality: N/A;    ROS: Review of Systems Negative except as stated above  PHYSICAL EXAM: BP 119/71 (BP Location: Left Arm, Patient Position: Sitting, Cuff Size: Normal)   Pulse 67   Temp 98 F (36.7 C) (Oral)   Resp 20   Ht 4\' 11"  (1.499 m)   Wt 240 lb (108.9 kg)   SpO2 100%   BMI 48.47 kg/m   Wt Readings from Last 3 Encounters:  10/24/23 240 lb (108.9 kg)  07/23/23 238 lb (108 kg)  05/09/23 249 lb (112.9 kg)  Pox on room air  Physical Exam  General appearance - alert, well appearing, and in no distress Mental status - normal mood, behavior, speech, dress, motor activity, and thought processes Chest - clear to auscultation, no wheezes, rales or rhonchi, symmetric air entry Heart - normal rate, regular rhythm, normal S1, S2, no murmurs, rubs, clicks or gallops Extremities - peripheral pulses normal, no  pedal edema, no clubbing or cyanosis      Latest Ref Rng & Units 04/23/2022   12:41 AM 04/21/2022   12:23 AM 04/20/2022    5:53 AM  CMP  Glucose 70 - 99 mg/dL 284  132  440   BUN 6 - 20 mg/dL 10  <5  <5   Creatinine 0.44 - 1.00 mg/dL 1.02  7.25  3.66   Sodium 135 - 145 mmol/L 140  141  141   Potassium 3.5 - 5.1 mmol/L 4.1  3.9  3.9   Chloride 98 - 111 mmol/L 103  102  102   CO2 22 - 32 mmol/L 32  32  33   Calcium 8.9 - 10.3 mg/dL 8.9  8.8  8.5   Total Protein 6.5 -  8.1 g/dL 6.4   7.0   Total Bilirubin 0.3 - 1.2 mg/dL 0.8   0.9   Alkaline Phos 38 - 126 U/L 114   113   AST 15 - 41 U/L 15   29   ALT 0 - 44 U/L 26   47    Lipid Panel     Component Value Date/Time   CHOL 276 (H) 11/24/2020 1102   TRIG 298 (H) 11/24/2020 1102   HDL 41 11/24/2020 1102   CHOLHDL 6.7 (H) 11/24/2020 1102   LDLCALC 177 (H) 11/24/2020 1102    CBC    Component Value Date/Time   WBC 6.7 04/23/2022 0041   RBC 3.99 04/23/2022 0041   HGB 12.8 04/23/2022 0041   HGB 14.8 11/24/2020 1102   HCT 39.9 04/23/2022 0041   HCT 44.8 11/24/2020 1102   PLT 204 04/23/2022 0041   PLT 221 11/24/2020 1102   MCV 100.0 04/23/2022 0041   MCV 96 11/24/2020 1102   MCH 32.1 04/23/2022 0041   MCHC 32.1 04/23/2022 0041   RDW 12.6 04/23/2022 0041   RDW 12.1 11/24/2020 1102   LYMPHSABS 1.7 04/23/2022 0041   MONOABS 0.7 04/23/2022 0041   EOSABS 0.3 04/23/2022 0041   BASOSABS 0.1 04/23/2022 0041    ASSESSMENT AND PLAN: 1. Morbid obesity with BMI of 45.0-49.9, adult (HCC) (Primary) She has not had any further weight loss since last visit.  She tells me that she is taking the phentermine consistently.  However in looking in the [place] controlled substance reporting system, I see that she had filled the medicine only once since last visit.  She denies any untoward side effects from the medicine.  We discussed trying her on the higher dose which is a 37.5 mg and patient is agreeable to this.  Strongly encourage healthy  eating habits.  Encouraged her to continue daily walks. - CBC - Comprehensive metabolic panel - Hemoglobin A1c - phentermine 37.5 MG capsule; Take 1 capsule (37.5 mg total) by mouth every morning.  Dispense: 30 capsule; Refill: 1  2. Prediabetes See #1 above - POCT glycosylated hemoglobin (Hb A1C) - Hemoglobin A1c  3. OSA (obstructive sleep apnea) Encouraged her to continue using her BiPAP.  She must be benefiting from it as she reports waking up refreshed and has had no increased daytime sleepiness.  Caseworker will touch base with [facility] to see if they can problem XU the issue that she is having with the BiPAP/oxygen  4. Mixed hyperlipidemia Continue atorvastatin 10 mg daily - Lipid panel  5. Colon cancer screening - Fecal occult blood, imunochemical([facility])  6. Encounter for immunization - Pneumococcal conjugate vaccine 20-valent     Patient was given the opportunity to ask questions.  Patient verbalized understanding of the plan and was able to repeat key elements of the plan.   This documentation was completed using Paediatric nurse.  Any transcriptional errors are unintentional.  Orders Placed This Encounter  Procedures   Fecal occult blood, imunochemical([facility])   Pneumococcal conjugate vaccine 20-valent   CBC   Comprehensive metabolic panel   Lipid panel   Hemoglobin A1c   POCT glycosylated hemoglobin (Hb A1C)     Requested Prescriptions   Signed Prescriptions Disp Refills   phentermine 37.5 MG capsule 30 capsule 1    Sig: Take 1 capsule (37.5 mg total) by mouth every morning.    Return in about 2 months (around 12/22/2023) for for f/u obesity.  Jonah Blue, MD, FACP

## 2023-10-25 ENCOUNTER — Other Ambulatory Visit: Payer: Self-pay | Admitting: Internal Medicine

## 2023-10-25 ENCOUNTER — Other Ambulatory Visit: Payer: Self-pay

## 2023-10-25 DIAGNOSIS — E782 Mixed hyperlipidemia: Secondary | ICD-10-CM

## 2023-10-25 LAB — CBC
Hematocrit: 41.5 % (ref 34.0–46.6)
Hemoglobin: 14.2 g/dL (ref 11.1–15.9)
MCH: 32.9 pg (ref 26.6–33.0)
MCHC: 34.2 g/dL (ref 31.5–35.7)
MCV: 96 fL (ref 79–97)
Platelets: 226 10*3/uL (ref 150–450)
RBC: 4.32 x10E6/uL (ref 3.77–5.28)
RDW: 11.8 % (ref 11.7–15.4)
WBC: 7.2 10*3/uL (ref 3.4–10.8)

## 2023-10-25 LAB — COMPREHENSIVE METABOLIC PANEL
ALT: 8 [IU]/L (ref 0–32)
AST: 17 [IU]/L (ref 0–40)
Albumin: 4.1 g/dL (ref 3.8–4.9)
Alkaline Phosphatase: 119 [IU]/L (ref 44–121)
BUN/Creatinine Ratio: 19 (ref 12–28)
BUN: 12 mg/dL (ref 8–27)
Bilirubin Total: 0.3 mg/dL (ref 0.0–1.2)
CO2: 25 mmol/L (ref 20–29)
Calcium: 9.2 mg/dL (ref 8.7–10.3)
Chloride: 102 mmol/L (ref 96–106)
Creatinine, Ser: 0.62 mg/dL (ref 0.57–1.00)
Globulin, Total: 2.8 g/dL (ref 1.5–4.5)
Glucose: 100 mg/dL — ABNORMAL HIGH (ref 70–99)
Potassium: 4.5 mmol/L (ref 3.5–5.2)
Sodium: 139 mmol/L (ref 134–144)
Total Protein: 6.9 g/dL (ref 6.0–8.5)
eGFR: 102 mL/min/{1.73_m2} (ref >59)

## 2023-10-25 LAB — LIPID PANEL
Chol/HDL Ratio: 6.4 {ratio} — ABNORMAL HIGH (ref 0.0–4.4)
Cholesterol, Total: 303 mg/dL — ABNORMAL HIGH (ref 100–199)
HDL: 47 mg/dL (ref >39)
LDL Chol Calc (NIH): 206 mg/dL — ABNORMAL HIGH (ref 0–99)
Triglycerides: 252 mg/dL — ABNORMAL HIGH (ref 0–149)
VLDL Cholesterol Cal: 50 mg/dL — ABNORMAL HIGH (ref 5–40)

## 2023-10-25 LAB — HEMOGLOBIN A1C
Est. average glucose Bld gHb Est-mCnc: 120 mg/dL
Hgb A1c MFr Bld: 5.8 % — ABNORMAL HIGH (ref 4.8–5.6)

## 2023-10-25 MED ORDER — ATORVASTATIN CALCIUM 10 MG PO TABS
10.0000 mg | ORAL_TABLET | Freq: Every day | ORAL | 2 refills | Status: DC
  Filled 2023-10-25: qty 90, 90d supply, fill #0

## 2023-10-25 NOTE — Progress Notes (Signed)
Cholesterol levels are significantly elevated indicating that she is not taking the cholesterol-lowering medication of atorvastatin consistently.  Looks like the last time she filled the prescription for 42-month supply was in July of last year.  I strongly recommend taking the medication as it will help to prevent heart attack and strokes.  Updated prescription sent to the pharmacy. -Still in the range for prediabetes.  Healthy eating habits and continued daily walks will help to prevent progression to full diabetes. -Kidney function normal.  Liver function test normal.  Blood cell counts normal.

## 2023-10-28 NOTE — Telephone Encounter (Signed)
I called [facility]: [phone] and spoke to [facility] about the patient's issues with her BiPAP and O2.  He stated that the patient received the machine about 4 years ago and they have no record that she ever replaced the supplies.  He said that most likely the supplies need to be replaced and that includes the enrichment port that bleeds in the O2 while the BiPAP is being used. He said they are old and there is a good chance, there is a leak.  He said they will not send someone out to the house to troubleshoot  these issues.  He recommended that the patient call [facility] : [phone], option 1 and speak to them about her issues.. He said that is the number for the CPAP department and they  can then transfer her to the O2 department to discuss O2 concerns. He stated that the supplies for the machines are well past their expiration datees.  I then called the patient with the assistance of Spanish Interpreter: [phone]/[employer] and explained my conversation with Zack as noted above.  She said that there is a whistling sound and she thinks it is from the  O2.  She said she uses the BiPAP but not the O2 because it is not working. She could not explain any more details other that the O2 whistles, not the BiPAP.  I explained to her that it is very important that she calls [facility]. She wrote down their number that Zack provided and she correctly repeated it back to me and said she would call them .

## 2023-10-30 ENCOUNTER — Other Ambulatory Visit: Payer: Self-pay

## 2023-10-31 LAB — FECAL OCCULT BLOOD, IMMUNOCHEMICAL: Fecal Occult Bld: NEGATIVE

## 2023-12-07 ENCOUNTER — Emergency Department (HOSPITAL_COMMUNITY): Payer: Self-pay

## 2023-12-07 ENCOUNTER — Emergency Department (HOSPITAL_COMMUNITY)
Admission: EM | Admit: 2023-12-07 | Discharge: 2023-12-07 | Disposition: A | Discharge status: Home / Self Care | Payer: Self-pay | Attending: Emergency Medicine | Admitting: Emergency Medicine

## 2023-12-07 ENCOUNTER — Other Ambulatory Visit: Payer: Self-pay

## 2023-12-07 ENCOUNTER — Encounter (HOSPITAL_COMMUNITY): Payer: Self-pay | Admitting: Emergency Medicine

## 2023-12-07 DIAGNOSIS — R112 Nausea with vomiting, unspecified: Secondary | ICD-10-CM | POA: Insufficient documentation

## 2023-12-07 DIAGNOSIS — H9202 Otalgia, left ear: Secondary | ICD-10-CM | POA: Insufficient documentation

## 2023-12-07 DIAGNOSIS — R059 Cough, unspecified: Secondary | ICD-10-CM | POA: Insufficient documentation

## 2023-12-07 DIAGNOSIS — I509 Heart failure, unspecified: Secondary | ICD-10-CM | POA: Insufficient documentation

## 2023-12-07 DIAGNOSIS — H65192 Other acute nonsuppurative otitis media, left ear: Secondary | ICD-10-CM

## 2023-12-07 DIAGNOSIS — Z9981 Dependence on supplemental oxygen: Secondary | ICD-10-CM

## 2023-12-07 DIAGNOSIS — R42 Dizziness and giddiness: Secondary | ICD-10-CM | POA: Insufficient documentation

## 2023-12-07 LAB — CBC
HCT: 42.6 % (ref 36.0–46.0)
Hemoglobin: 14 g/dL (ref 12.0–15.0)
MCH: 32.3 pg (ref 26.0–34.0)
MCHC: 32.9 g/dL (ref 30.0–36.0)
MCV: 98.4 fL (ref 80.0–100.0)
Platelets: 236 10*3/uL (ref 150–400)
RBC: 4.33 MIL/uL (ref 3.87–5.11)
RDW: 12.3 % (ref 11.5–15.5)
WBC: 8.6 10*3/uL (ref 4.0–10.5)
nRBC: 0 % (ref 0.0–0.2)

## 2023-12-07 LAB — BASIC METABOLIC PANEL
Anion gap: 10 (ref 5–15)
BUN: 13 mg/dL (ref 6–20)
CO2: 25 mmol/L (ref 22–32)
Calcium: 9 mg/dL (ref 8.9–10.3)
Chloride: 104 mmol/L (ref 98–111)
Creatinine, Ser: 0.6 mg/dL (ref 0.44–1.00)
GFR, Estimated: >60 mL/min (ref >60)
Glucose, Bld: 141 mg/dL — ABNORMAL HIGH (ref 70–99)
Potassium: 3.6 mmol/L (ref 3.5–5.1)
Sodium: 139 mmol/L (ref 135–145)

## 2023-12-07 LAB — BRAIN NATRIURETIC PEPTIDE: B Natriuretic Peptide: 45.1 pg/mL (ref 0.0–100.0)

## 2023-12-07 LAB — CBG MONITORING, ED
Glucose-Capillary: 129 mg/dL — ABNORMAL HIGH (ref 70–99)
Glucose-Capillary: 130 mg/dL — ABNORMAL HIGH (ref 70–99)

## 2023-12-07 MED ORDER — LACTATED RINGERS IV BOLUS
1000.0000 mL | Freq: Once | INTRAVENOUS | Status: AC
  Administered 2023-12-07: 1000 mL via INTRAVENOUS

## 2023-12-07 MED ORDER — MECLIZINE HCL 25 MG PO TABS
50.0000 mg | ORAL_TABLET | Freq: Once | ORAL | Status: AC
  Administered 2023-12-07: 50 mg via ORAL
  Filled 2023-12-07: qty 2

## 2023-12-07 MED ORDER — MECLIZINE HCL 25 MG PO TABS
25.0000 mg | ORAL_TABLET | Freq: Three times a day (TID) | ORAL | 0 refills | Status: DC | PRN
  Filled 2023-12-07: qty 30, 10d supply, fill #0

## 2023-12-07 MED ORDER — IOHEXOL 350 MG/ML SOLN
75.0000 mL | Freq: Once | INTRAVENOUS | Status: AC | PRN
  Administered 2023-12-07: 75 mL via INTRAVENOUS

## 2023-12-07 NOTE — Discharge Instructions (Addendum)
 You have a lot of fluid behind your ear, which I think is causing the vertigo.  Please take the meclizine, as prescribed to help with the dizziness.  I do not believe that this is infected, I do not think that you have an ear infection, as your ear exam looked pretty good.  However please return to the ER if you have any kind of redness, swelling of your outer ear.  You have any fevers, chills, or changes in vision.

## 2023-12-07 NOTE — ED Provider Notes (Signed)
 [place] EMERGENCY DEPARTMENT AT [facility] Provider Note   CSN: [id number] Arrival date & time: 12/07/23  1132     History  Chief Complaint  Patient presents with   Dizziness    Veronica Castaneda is a 63 y.o. female patient with past medical history of obesity, obesity hypoventilation syndrome, obstructive sleep apnea, hyperlipidemia, congestive heart failure last echo EF 55-60% is reporting to the emergency room with complaint of dizziness that started this morning. Patient reports that dizziness is intermittent and feels like the room is spinning.  Patient reports symptoms are reproduced upon standing or with turning her head from side-to-side.  It is associated with nausea and vomiting.  Patient reports she recently had a cold 2 weeks ago and has had some left ear pain since then. No change in hearing. Denies any fevers or chills. Dose will have mild cough. Denies any chest pain, shortness of breath, abdominal pain or diarrhea. Patient has home O2, did not bring to ER thus placed on 2L [place] on arrival.   Patient and family member spanish speaking, interrupter service offered and used for HPI.  Dizziness      Home Medications Prior to Admission medications   Medication Sig Start Date End Date Taking? Authorizing Provider  albuterol (VENTOLIN HFA) 108 (90 Base) MCG/ACT inhaler INHALE 2 PUFFS INTO THE LUNGS EVERY 4 (FOUR) HOURS AS NEEDED FOR WHEEZING OR SHORTNESS OF BREATH. 04/11/23   Marcine Matar, MD  atorvastatin (LIPITOR) 10 MG tablet Take 1 tablet (10 mg total) by mouth daily. 10/25/23   Marcine Matar, MD  diclofenac Sodium (VOLTAREN) 1 % GEL Apply 2 g topically 4 (four) times daily. 11/28/21   Marcine Matar, MD  fluticasone furoate-vilanterol (BREO ELLIPTA) 100-25 MCG/ACT AEPB Inhale 1 puff into the lungs daily. 04/11/23   Marcine Matar, MD  furosemide (LASIX) 20 MG tablet Take 1-2 tablets (20-40 mg total) by mouth daily as needed for swelling.  04/11/23   Marcine Matar, MD  metFORMIN (GLUCOPHAGE) 500 MG tablet Take 0.5 tablets (250 mg total) by mouth daily with breakfast. 11/20/22   Marcine Matar, MD  phentermine 37.5 MG capsule Take 1 capsule (37.5 mg total) by mouth every morning. 10/24/23   Marcine Matar, MD      Allergies    Patient has no known allergies.    Review of Systems   Review of Systems  Neurological:  Positive for dizziness.    Physical Exam Updated Vital Signs BP (!) 115/59 (BP Location: Right Arm)   Pulse 71   Temp 97.7 F (36.5 C) (Oral)   Resp 18   Ht 4\' 11"  (1.499 m)   Wt 108 kg   SpO2 92%   BMI 48.07 kg/m  Physical Exam Vitals and nursing note reviewed.  Constitutional:      General: She is not in acute distress.    Appearance: She is not toxic-appearing.  HENT:     Head: Normocephalic and atraumatic.     Right Ear: Tympanic membrane, ear canal and external ear normal.     Left Ear: Tympanic membrane, ear canal and external ear normal.  Eyes:     General: No scleral icterus.    Extraocular Movements: Extraocular movements intact.     Conjunctiva/sclera: Conjunctivae normal.     Pupils: Pupils are equal, round, and reactive to light.  Cardiovascular:     Rate and Rhythm: Normal rate and regular rhythm.     Pulses: Normal  pulses.     Heart sounds: Normal heart sounds.  Pulmonary:     Effort: Pulmonary effort is normal. No respiratory distress.     Breath sounds: Normal breath sounds.     Comments: 2L [place] Abdominal:     General: Abdomen is flat. Bowel sounds are normal.     Palpations: Abdomen is soft.     Tenderness: There is no abdominal tenderness.  Musculoskeletal:     Right lower leg: No edema.     Left lower leg: No edema.  Skin:    General: Skin is warm and dry.     Findings: No lesion.  Neurological:     General: No focal deficit present.     Mental Status: She is alert and oriented to person, place, and time. Mental status is at baseline.     Comments: Patient  alert oriented and well-appearing.  Answering questions with no slurred speech.  She has no focal neurological deficits.  No ataxia.  Upper and lower extremity sensation and strength equal bilaterally.  No nystagmus pupils equal and reactive.  Symptoms of dizziness reproduced when she is rotating head from side-to-side.     ED Results / Procedures / Treatments   Labs (all labs ordered are listed, but only abnormal results are displayed) Labs Reviewed  BASIC METABOLIC PANEL - Abnormal; Notable for the following components:      Result Value   Glucose, Bld 141 (*)    All other components within normal limits  CBG MONITORING, ED - Abnormal; Notable for the following components:   Glucose-Capillary 129 (*)    All other components within normal limits  CBG MONITORING, ED - Abnormal; Notable for the following components:   Glucose-Capillary 130 (*)    All other components within normal limits  CBC  BRAIN NATRIURETIC PEPTIDE  URINALYSIS, ROUTINE W REFLEX MICROSCOPIC    EKG EKG Interpretation Date/Time:  Saturday December 07 2023 11:50:45 EDT Ventricular Rate:  79 PR Interval:  148 QRS Duration:  86 QT Interval:  402 QTC Calculation: 460 R Axis:   39  Text Interpretation: Sinus rhythm with frequent Premature ventricular complexes ventricular trigeminy Abnormal ECG When compared with ECG of 19-Apr-2022 20:55, PREVIOUS ECG IS PRESENT Confirmed by Kommor, Madison ([id number]) on 12/07/2023 12:07:48 PM  Radiology DG Chest 2 View Result Date: 12/07/2023 CLINICAL DATA:  Cough.  Weak and dizzy for the past hour. EXAM: CHEST - 2 VIEW COMPARISON:  Chest radiographs 04/22/2022, 04/19/2022, 05/21/2020 FINDINGS: Cardiac silhouette is again mildly to moderately enlarged. Mediastinal contours are thrombus. Mild bilateral interstitial thickening appears unchanged from baseline 05/21/2020 radiographs. Resolution of the prior interstitial thickening and bibasilar heterogeneous airspace opacity seen on most recent  04/22/2022 radiograph. No pleural effusion pneumothorax. Moderate multilevel degenerative disc changes of the thoracic spine. IMPRESSION: 1. Resolution of the prior interstitial thickening and bibasilar heterogeneous airspace opacity seen on most recent 04/22/2022 radiograph. 2. Mild bilateral interstitial thickening appears unchanged from baseline 05/21/2020 radiographs. No acute lung process. Electronically Signed   By: Neita Garnet M.D.   On: 12/07/2023 12:57    Procedures Procedures    Medications Ordered in ED Medications  lactated ringers bolus 1,000 mL (has no administration in time range)  meclizine (ANTIVERT) tablet 50 mg (has no administration in time range)    ED Course/ Medical Decision Making/ A&P  Medical Decision Making Amount and/or Complexity of Data Reviewed Labs: ordered. Radiology: ordered.   Veronica Castaneda 63 y.o. presented today for vertigo. Working DDx that I considered at this time includes, but not limited to, CVA/TIA, arrhythmia, vertigo, medication s/e, orthostatic hypotension, electrolyte abnormalities, dehydration, URI, ACS, UTI, anemia   R/o DDx: These are considered less likely than current impression due to history of present illness, physical exam, lab/imaging findings   Review of prior external notes: 10/24/23  Pmhx: OSA, obesity hypoventilation syndrome, diabetes  Labs: Cbg 129 CBC without leukocytosis and no anemia BG 130 BMP without electrolyte abnormality  BNP 45 -- no sign of fluid overload on exam today.   Imaging: CT head and chest x-ray -- Pending   Problem List / ED Course / Critical interventions / Medication management  Patient reported to emergency room with vertigo.  Patient reports this started several hours ago.  Vertigo is intermittent.  She has no symptoms at rest.  Symptoms are reproduced upon standing or turning head side-to-side.  It is associate with nausea and vomiting.  She  reports she had recent viral illness approximately 2 weeks ago and has some left ear pain. No noted AOM on physical exam. She has no focal neurological deficits on exam and no slurred speech.  She is hemodynamically stable.  Will check labs, check head CT due to dizziness, chest x-ray due to cough and give NS and meclizine for dizziness.  Patient is able to stand but does not feel comfortable ambulating secondary to dizziness. Suspect peripheral vertigo secondary to recent URI.  Patient re-evaluated. She had been able to ambulate with steady gait. She reports her symptoms have improved significantly after NS and meclizine. Discussed imaging and labs with patient and family member at bedside. Dispo is pending CT imaging and they express there understanding.  I ordered medication including NS, meclizine  Reevaluation of the patient after these medicines showed that the patient improved Patients vitals assessed. Upon arrival patient is hemodynamically stable.  I have reviewed the patients home medicines and have made adjustments as needed  Consult: None   Plan: Patient signed off to [employer] at shift change. Dispo is pending CT head imaging.          Final Clinical Impression(s) / ED Diagnoses Final diagnoses:  Vertigo    Rx / DC Orders ED Discharge Orders     None         Reinaldo Raddle 12/07/23 1717    Glendora Score, MD 12/08/23 2122

## 2023-12-07 NOTE — ED Provider Notes (Addendum)
 Handoff from Richrd Sox, PA. Intermittent dizziness starting at 9AM. Pending head CT. Sx worse with moving head and standing. Able to ambulate appropriately. No nystagmus. Likely peripheral vertigo. Plan with DC w/meclizine.   Physical Exam  BP 99/66   Pulse 70   Temp 97.7 F (36.5 C) (Oral)   Resp 18   Ht 4\' 11"  (1.499 m)   Wt 108 kg   SpO2 94%   BMI 48.07 kg/m   Physical Exam Vitals and nursing note reviewed.  Constitutional:      General: She is not in acute distress.    Appearance: She is well-developed.  HENT:     Head: Normocephalic and atraumatic.     Right Ear: Tympanic membrane normal. No mastoid tenderness.     Left Ear: A middle ear effusion is present. No mastoid tenderness.     Ears:     Comments: No mastoid tenderness to palpation, or sponginess.  No erythema, or overlying edema of the bilateral ears. Eyes:     Conjunctiva/sclera: Conjunctivae normal.  Cardiovascular:     Rate and Rhythm: Normal rate and regular rhythm.     Heart sounds: No murmur heard. Pulmonary:     Effort: Pulmonary effort is normal. No respiratory distress.     Breath sounds: Normal breath sounds.  Abdominal:     Palpations: Abdomen is soft.     Tenderness: There is no abdominal tenderness.  Musculoskeletal:        General: No swelling.     Cervical back: Neck supple.  Skin:    General: Skin is warm and dry.     Capillary Refill: Capillary refill takes less than 2 seconds.  Neurological:     Mental Status: She is alert.     Comments: No neurodeficits on exam  Psychiatric:        Mood and Affect: Mood normal.     Procedures  Procedures  ED Course / MDM    Medical Decision Making Patient is a well-appearing 63 year old female, who has had intermittent dizziness, that is been worse, with standing, or moving her head.  Her CT head, shows concern for possible venous thrombosis, versus mastoiditis.  She has no mastoid tenderness to palpation, but she does have visualize left ear  effusion.  This likely from an upper respiratory infection that she had a few weeks ago.  We will obtain a CTV, to rule out any kind of dural venous thrombosis, given her headache, and dizziness.  She is currently denying any dizziness, or headache, after given meclizine.  Amount and/or Complexity of Data Reviewed Labs: ordered.    Details: Labs unremarkable Radiology: ordered.    Details: Head CT, shows possible thrombosis, versus mastoiditis, versus effusion, CTV, shows large effusion Discussion of management or test interpretation with external provider(s): Patient has large effusion, the ear, however does not appear to be otitis media, on my exam.  She does have an effusion, but does not appear to be infected.  I believe that this is likely secondary to her upper respiratory infection that she had a few weeks ago.  Prescribed her meclizine, which should act as an antihistamine, to help treat this area.  Instructed her to follow-up with her primary care doctor.  We discussed return precautions, and she voiced understanding.  Is able to ambulate easily, and is overall well-appearing.  Symptoms have resolved.  Additionally patient states she is out of oxygen at home, case management is arranging, for oxygen placement/follow-up at home  Risk  Prescription drug management.      Pete Pelt, PA 12/07/23 1906    Pete Pelt, [place] 12/07/23 Windell Moment    Tilden Fossa, MD 12/07/23 2111

## 2023-12-07 NOTE — ED Notes (Signed)
 Pt discharged home with oxygen tank and nasal cannula.

## 2023-12-07 NOTE — Care Management (Signed)
 Transition of Care [facility]) - Emergency Department Mini Assessment   Patient Details  Name: Veronica Castaneda MRN: [id number] Date of Birth: Sep 05, 1961  [facility] of [facility]) CM/SW Contact:    Lockie Pares, RN Phone Number: 12/07/2023, 3:47 PM   Clinical Narrative:  Patient presented to ED with weakness and dizziness, vomiting. She states she has "run out of oxygen" she uses it at home at night with [employer].Searched records found her oxygen provider is [facility]. Called [facility] and spoke to ada. She is placing a ticket to go to the home to check the Machine. She will send tanks to the patient to go home with. Alerted provider and Nursing.  ED Mini Assessment: What brought you to the Emergency Department? : Vomiting  Barriers to Discharge: ED No Barriers  Barrier interventions: Bringing oxygen from [facility]     Interventions which prevented an admission or readmission: DME Provided    Patient Contact and Communications        ,                 Admission diagnosis:  Emesis Patient Active Problem List   Diagnosis Date Noted   Chronic diastolic heart failure (HCC) 11/20/2022   Chronic right-sided heart failure (HCC) 05/18/2022   Asthma in adult, mild persistent, uncomplicated 05/18/2022   Chronic respiratory failure with hypoxia, on home oxygen therapy (HCC) 05/18/2022   CAP (community acquired pneumonia) 04/20/2022   Abnormal LFTs 04/20/2022   Ventral hernia without obstruction or gangrene    Umbilical hernia without obstruction and without gangrene    Hyperventilation syndrome 11/24/2020   Asthma exacerbation 05/22/2020   Morbid obesity with body mass index (BMI) of 50.0 to 59.9 in adult (HCC) 05/22/2020   Obesity hypoventilation syndrome (HCC) 05/22/2020   OSA (obstructive sleep apnea) 05/22/2020   RSV (respiratory syncytial virus infection) 05/22/2020   Difficult airway for intubation 05/22/2020   Acute respiratory failure with hypoxia (HCC) 05/12/2020    Loud snoring 02/11/2020   Daytime sleepiness 02/11/2020   Screening breast examination 10/22/2019   Prediabetes 08/18/2019   Hyperlipidemia 08/18/2019   Morbid obesity (HCC) 08/17/2019   PCP:  Marcine Matar, MD Pharmacy:   [facility] [address] [place] [address] Phone: [phone] Fax: [phone]  [facility] [address] [place] [address] Phone: [phone] Fax: [phone]

## 2023-12-07 NOTE — ED Triage Notes (Signed)
 Pt arrives via POV from home with family who states patient has been feeling weak and dizzy for the last hour. Noted to have low heart rate in triage. Will obtain EKG, place O2, get labs and take to open room. Pt reportedly vomited 3 times prior to arrival. Appears lethargic. Pt is supposed to wear O2 only at night but per family has been out for the last several weeks.

## 2023-12-09 ENCOUNTER — Other Ambulatory Visit: Payer: Self-pay

## 2023-12-10 ENCOUNTER — Other Ambulatory Visit: Payer: Self-pay

## 2023-12-23 ENCOUNTER — Ambulatory Visit: Payer: Self-pay | Admitting: Internal Medicine

## 2024-01-06 ENCOUNTER — Encounter: Payer: Self-pay | Admitting: Internal Medicine

## 2024-01-06 ENCOUNTER — Ambulatory Visit: Payer: Self-pay | Attending: Internal Medicine | Admitting: Internal Medicine

## 2024-01-06 VITALS — BP 106/68 | HR 72 | Temp 97.9°F | Ht 59.0 in | Wt 237.0 lb

## 2024-01-06 DIAGNOSIS — R42 Dizziness and giddiness: Secondary | ICD-10-CM

## 2024-01-06 DIAGNOSIS — H7092 Unspecified mastoiditis, left ear: Secondary | ICD-10-CM

## 2024-01-06 DIAGNOSIS — Z6841 Body Mass Index (BMI) 40.0 and over, adult: Secondary | ICD-10-CM

## 2024-01-06 DIAGNOSIS — G4733 Obstructive sleep apnea (adult) (pediatric): Secondary | ICD-10-CM

## 2024-01-06 DIAGNOSIS — R7303 Prediabetes: Secondary | ICD-10-CM

## 2024-01-06 DIAGNOSIS — I493 Ventricular premature depolarization: Secondary | ICD-10-CM

## 2024-01-06 NOTE — Patient Instructions (Signed)
 Alimentacin saludable en los [employer], Adult Una alimentacin saludable puede ayudarlo a Barista y Pharmacologist un peso saludable, reducir el riesgo de tener enfermedades crnicas y vivir Neomia Dear vida larga y productiva. Es importante que siga una modalidad de alimentacin saludable. Sus necesidades nutricionales y calricas deben satisfacerse principalmente con distintos alimentos ricos en nutrientes. Consejos para seguir Surveyor, minerals Lea las etiquetas de los alimentos Lea las etiquetas y elija las que digan lo siguiente: Productos reducidos en sodio o con bajo contenido de [place]. Jugos con 100 % jugo de fruta. Alimentos con bajo contenido de grasas saturadas (menos de 3 g por porcin) y alto contenido de grasas poliinsaturadas y Mining engineer. Alimentos con cereales integrales, como trigo integral, trigo partido, arroz integral y arroz salvaje. Cereales integrales fortificados con cido flico. Esto se recomienda a las mujeres embarazadas o que desean quedar embarazadas. Lea las etiquetas y no coma ni beba lo siguiente: Alimentos o bebidas con azcar agregada. Estos incluyen los alimentos que contienen azcar moreno, endulzante a base de maz, jarabe de maz, dextrosa, fructosa, glucosa, jarabe de maz de alta fructosa, miel, azcar invertido, lactosa, jarabe de [place], maltosa, Sunflower, azcar sin refinar, sacarosa, trehalosa y azcar turbinado. Limite el consumo de azcar agregada a menos del 10 % del total de caloras diarias. No consuma ms que las siguientes cantidades de azcar agregada por da: 6 cucharaditas (25 g) para las mujeres. 9 cucharaditas (38 g) para los hombres. Los alimentos que contienen almidones y cereales refinados o procesados. Los productos de cereales refinados, como harina blanca, harina de maz desgerminada, pan blanco y arroz blanco. Al ir de compras Elija refrigerios ricos en nutrientes, como verduras, frutas enteras y frutos secos. Evite los refrigerios con  alto contenido de caloras y International aid/development worker, como las papas fritas, los refrigerios frutales y los caramelos. Use alios y productos para untar a base de aceite con los Publishing rights manager de grasas slidas como la [place], la [place], la crema agria o el queso crema. Limite las salsas, las mezclas y los productos "instantneos" preelaborados como el arroz saborizado, los fideos instantneos y las pastas listas para comer. Pruebe ms fuentes de protena vegetal, como tofu, tempeh, frijoles negros, edamame, lentejas, frutos secos y semillas. Explore planes de alimentacin como la dieta mediterrnea o la dieta vegetariana. Pruebe salsas cardiosaludables hechas con frijoles y grasas saludables, como hummus y [place]. Las verduras van muy bien con ellas. Al cocinar Use aceite para Designer, multimedia de grasas slidas como [place], margarina o [place] de [place]. En lugar de frer, trate de cocinar en el horno, en la plancha o en la parrilla, o hervir los alimentos. Retire la parte grasa de las carnes antes de cocinarlas. Cocine las verduras al vapor en agua o caldo. Planificacin de las comidas  En las comidas, imagine dividir su plato en cuartos: La mitad del plato tiene frutas y verduras. Un cuarto del plato tiene cereales integrales. Un cuarto del plato tiene protena, especialmente carnes [place], aves, huevos, tofu, frijoles o frutos secos. Incluya lcteos descremados en su dieta diaria. Estilo de vida Elija opciones saludables en todos los mbitos, como en el hogar, el [place], la [place], los restaurantes y [place]. Prepare los alimentos de un modo seguro: Lvese las manos despus de manipular carnes crudas. Donde prepare alimentos, mantenga las superficies limpias lavndolas regularmente con agua caliente y [place]. Mantenga las carnes crudas separadas de los alimentos que estn listos para comer como las frutas y las verduras. Cocine los frutos  de mar, carnes, aves y Loss adjuster, chartered la temperatura recomendada. Consiga un termmetro para alimentos. Almacene los alimentos a temperaturas seguras. En general: Mantenga los alimentos fros a una temperatura de 40 F (4,4 C) o inferior. Mantenga los alimentos calientes a una temperatura de 140 F (60 C) o superior. Mantenga el congelador a una temperatura de 0 F (-17,8 C) o inferior. Los alimentos no son seguros para su consumo cuando han estado a una temperatura de entre 40 y 140 F (4.4 y 60 C) por ms de 2 horas. Qu alimentos debo comer? Frutas Propngase comer entre 1 y 2 tazas de frutas frescas, Primary school teacher (en su jugo natural) o Primary school teacher. Una taza de fruta equivale a 1 manzana pequea, 1 banana grande, 8 fresas grandes, 1 taza (237 g) de fruta enlatada,  taza (82 g) de fruta seca o 1 taza (240 ml) de jugo al 100 %. Verduras Propngase comer de 2 a 4 tazas de verduras frescas y Primary school teacher, incluyendo diferentes variedades y colores. Una taza de verduras equivale a 1 taza (91 g) de brcoli o coliflor, 2 zanahorias medianas, 2 tazas (150 g) de verduras de [employer] crudas, 1 tomate grande, 1 pimiento morrn grande, 1 batata grande o 1 patata blanca mediana. Cereales Propngase comer el equivalente a entre 4 y 10 onzas de cereales integrales por Futures trader. Algunos ejemplos de equivalentes a 1 onza de cereales son 1 rebanada de pan, 1 taza (40 g) de cereal listo para comer, 3 tazas (24 g) de palomitas de maz o  taza (93 g) de arroz cocido. Carnes y otras protenas Propngase comer el equivalente a entre 5 y 7  onzas de protena por Futures trader. Algunos ejemplos de equivalentes a 1 onza de protenas incluyen 1 huevo,  oz de frutos secos (12 almendras, 24 pistachos o 7 mitades de nueces), 1/4 taza (90 g) de frijoles cocidos, 6 cucharadas (90 g) de hummus o 1 cucharada (16 g) de [place] de man. Un corte de carne o pescado del tamao de un mazo de cartas equivale aproximadamente a 3 a 4 onzas (85  g). De las protenas que consume cada semana, intente que al menos 8 onzas (227 g) sean frutos de mar. Esto equivale a unas 2 porciones por semana. Esto incluye salmn, trucha, arenque y anchoas. Lcteos [employer] a 3 tazas de lcteos descremados o con bajo contenido de Museum/gallery curator. Algunos ejemplos de equivalentes a 1 taza de lcteos son 1 taza (240 ml) de leche, 8 onzas (250 g) de yogur, 1 onzas (44 g) de queso natural o 1 taza (240 ml) de leche de soja fortificada. Grasas y aceites Propngase consumir alrededor de 5 cucharaditas (21 g) de grasas y Acupuncturist. Elija grasas monoinsaturadas, como el aceite de canola y de [place], la [place] con aceite de [place] o de [place], [place], [place] de man y Games developer de los frutos secos, o bien grasas poliinsaturadas, como el aceite de [place], maz y soja, nueces, piones, semillas de ssamo, semillas de girasol y semillas de lino. Bebidas Propngase beber 6 vasos de 8 onzas de Warehouse manager. Limite el caf a entre 3 y 5 tazas de ocho onzas por Futures trader. Limite el consumo de bebidas con cafena que tengan caloras agregadas, como los refrescos y las bebidas energizantes. Si bebe alcohol: Limite la cantidad que bebe a lo siguiente: De 0 a 1 medida al da si es [place]. De 0 a 2 medidas  al da si es varn. Sepa cunta cantidad de alcohol hay en las bebidas que toma. En los [address], una medida es una botella de cerveza de 12 oz (355 ml), un vaso de vino de 5 oz (148 ml) o un vaso de una bebida alcohlica de alta graduacin de 1 oz (44 ml). Condimentos y otros alimentos Trate de no agregar demasiada sal a los alimentos. Trate de usar hierbas y especias en lugar de sal. Trate de no agregar azcar a los alimentos. Esta informacin se basa en las pautas de nutricin de los [place]. Para obtener ms informacin, visite [email or web address]. Las Information systems manager. Es posible que necesite cantidades  diferentes. Esta informacin no tiene Theme park manager el consejo del mdico. Asegrese de hacerle al mdico cualquier pregunta que tenga. Document Revised: 07/10/2022 Document Reviewed: 07/10/2022 Elsevier Patient Education  2024 [employer].

## 2024-01-06 NOTE — Progress Notes (Signed)
 Patient ID: Veronica Castaneda, female    DOB: 05/27/1961  MRN: [id number]  CC: Follow-up (ER f/u./No questions / concerns/)   Subjective: Veronica Castaneda is a 63 y.o. female who presents for f/u obesity and ER f/u. Her concerns today include:  Pt with preDM/morbid obesity, OA LT knee, mixed HL, persistent asthma, hypoventilation syndrome with likely OSA on BiPAP, chronic hypoxia on home O2    [employer] Language interpreter used during this encounter. #[id number], Nadara Eaton  Discussed the use of AI scribe software for clinical note transcription with the patient, who gave verbal consent to proceed.  History of Present Illness   Ms. Veronica Castaneda, a patient with a history of morbid obesity and OSA, presents for a two-month follow-up visit.   Since the last visit, she was seen in the emergency room last month for intermittent dizziness times several days. A CT scan of the head revealed extensive mastoid effusion on the left and mucosal thickening and a small amount of fluid in the left maxillary sinus consistent with rhinosinusitis.  Since the ER visit, the patient reports resolution of the dizziness. She experienced some pain and hearing loss in the left ear, which has since resolved.  The patient also reports running out of home oxygen, which has since been resolved by the oxygen supply company. She is now able to use her oxygen at night with her BiPAP machine. She denies any daytime sleepiness or increased tiredness.  The patient was previously prescribed phentermine 37.5 mg daily for weight loss. She self-adjusted the dose from one to two tablets daily, which resulted in dizziness. She has since returned to the prescribed dose of one tablet. Despite this, there has been no significant weight loss over the past several months.       Patient Active Problem List   Diagnosis Date Noted   Chronic diastolic heart failure (HCC) 11/20/2022   Chronic right-sided heart failure (HCC)  05/18/2022   Asthma in adult, mild persistent, uncomplicated 05/18/2022   Chronic respiratory failure with hypoxia, on home oxygen therapy (HCC) 05/18/2022   CAP (community acquired pneumonia) 04/20/2022   Abnormal LFTs 04/20/2022   Ventral hernia without obstruction or gangrene    Umbilical hernia without obstruction and without gangrene    Hyperventilation syndrome 11/24/2020   Asthma exacerbation 05/22/2020   Morbid obesity with body mass index (BMI) of 50.0 to 59.9 in adult (HCC) 05/22/2020   Obesity hypoventilation syndrome (HCC) 05/22/2020   OSA (obstructive sleep apnea) 05/22/2020   RSV (respiratory syncytial virus infection) 05/22/2020   Difficult airway for intubation 05/22/2020   Acute respiratory failure with hypoxia (HCC) 05/12/2020   Loud snoring 02/11/2020   Daytime sleepiness 02/11/2020   Screening breast examination 10/22/2019   Prediabetes 08/18/2019   Hyperlipidemia 08/18/2019   Morbid obesity (HCC) 08/17/2019     Current Outpatient Medications on File Prior to Visit  Medication Sig Dispense Refill   albuterol (VENTOLIN HFA) 108 (90 Base) MCG/ACT inhaler INHALE 2 PUFFS INTO THE LUNGS EVERY 4 (FOUR) HOURS AS NEEDED FOR WHEEZING OR SHORTNESS OF BREATH. 6.7 g 1   atorvastatin (LIPITOR) 10 MG tablet Take 1 tablet (10 mg total) by mouth daily. 90 tablet 2   diclofenac Sodium (VOLTAREN) 1 % GEL Apply 2 g topically 4 (four) times daily. 100 g 1   fluticasone furoate-vilanterol (BREO ELLIPTA) 100-25 MCG/ACT AEPB Inhale 1 puff into the lungs daily. 60 each 6   furosemide (LASIX) 20 MG tablet Take 1-2 tablets (20-40 mg total) by  mouth daily as needed for swelling. 60 tablet 6   meclizine (ANTIVERT) 25 MG tablet Take 1 tablet (25 mg total) by mouth 3 (three) times daily as needed for dizziness. 30 tablet 0   metFORMIN (GLUCOPHAGE) 500 MG tablet Take 0.5 tablets (250 mg total) by mouth daily with breakfast. 45 tablet 2   phentermine 37.5 MG capsule Take 1 capsule (37.5 mg total)  by mouth every morning. 30 capsule 1   No current facility-administered medications on file prior to visit.    No Known Allergies  Social History   Socioeconomic History   Marital status: Married    Spouse name: Not on file   Number of children: 3   Years of education: Not on file   Highest education level: 2nd grade  Occupational History   Occupation: house wife  Tobacco Use   Smoking status: Never   Smokeless tobacco: Never  Vaping Use   Vaping status: Never Used  Substance and Sexual Activity   Alcohol use: Never   Drug use: Never   Sexual activity: Yes    Birth control/protection: None  Other Topics Concern   Not on file  Social History Narrative   Not on file   Social Drivers of Health   Financial Resource Strain: Medium Risk (01/06/2024)   Overall Financial Resource Strain (CARDIA)    Difficulty of Paying Living Expenses: Somewhat hard  Food Insecurity: No Food Insecurity (01/06/2024)   Hunger Vital Sign    Worried About Running Out of Food in the Last Year: Never true    Ran Out of Food in the Last Year: Never true  Transportation Needs: No Transportation Needs (01/06/2024)   PRAPARE - Administrator, Civil Service (Medical): No    Lack of Transportation (Non-Medical): No  Physical Activity: Insufficiently Active (01/06/2024)   Exercise Vital Sign    Days of Exercise per Week: 2 days    Minutes of Exercise per Session: 20 min  Stress: No Stress Concern Present (01/06/2024)   [employer] of Occupational Health - Occupational Stress Questionnaire    Feeling of Stress : Not at all  Social Connections: Socially Integrated (01/06/2024)   Social Connection and Isolation Panel [NHANES]    Frequency of Communication with Friends and Family: Twice a week    Frequency of Social Gatherings with Friends and Family: Twice a week    Attends Religious Services: More than 4 times per year    Active Member of [employer] or Organizations: Yes    Attends Tax inspector Meetings: 1 to 4 times per year    Marital Status: Married  Catering manager Violence: Not At Risk (01/06/2024)   Humiliation, Afraid, Rape, and Kick questionnaire    Fear of Current or Ex-Partner: No    Emotionally Abused: No    Physically Abused: No    Sexually Abused: No    Family History  Problem Relation Age of Onset   Heart attack Mother    Heart disease Mother    Breast cancer Neg Hx     Past Surgical History:  Procedure Laterality Date   INSERTION OF MESH  06/15/2021   Procedure: INSERTION OF MESH;  Surgeon: Henrene Dodge, MD;  Location: [facility] ORS;  Service: General;;   NO PAST SURGERIES     No PAST SURGICAL HISTORY     XI ROBOTIC ASSISTED VENTRAL HERNIA N/A 06/15/2021   Procedure: XI ROBOTIC ASSISTED VENTRAL HERNIA;  Surgeon: Henrene Dodge, MD;  Location: [facility] ORS;  Service: General;  Laterality: N/A;    ROS: Review of Systems Negative except as stated above  PHYSICAL EXAM: BP 106/68 (BP Location: Left Arm, Patient Position: Sitting, Cuff Size: Large)   Pulse 72   Temp 97.9 F (36.6 C) (Oral)   Ht 4\' 11"  (1.499 m)   Wt 237 lb (107.5 kg)   SpO2 93%   BMI 47.87 kg/m   Wt Readings from Last 3 Encounters:  01/06/24 237 lb (107.5 kg)  12/07/23 238 lb (108 kg)  10/24/23 240 lb (108.9 kg)    Physical Exam  General appearance - alert, well appearing, morbidly obese Hispanic female and in no distress Mental status - normal mood, behavior, speech, dress, motor activity, and thought processes Ears - no tenderness over mastoid bone LT side.  There is also no tenderness over the maxillary sinuses of the face. Chest - clear to auscultation, no wheezes, rales or rhonchi, symmetric air entry Heart - normal rate, regular rhythm, normal S1, S2, no murmurs, rubs, clicks or gallops Extremities - peripheral pulses normal, no pedal edema, no clubbing or cyanosis      Latest Ref Rng & Units 12/07/2023   11:58 AM 10/24/2023    4:49 PM 04/23/2022   12:41 AM  CMP   Glucose 70 - 99 mg/dL 191  478  295   BUN 6 - 20 mg/dL 13  12  10    Creatinine 0.44 - 1.00 mg/dL 6.21  3.08  6.57   Sodium 135 - 145 mmol/L 139  139  140   Potassium 3.5 - 5.1 mmol/L 3.6  4.5  4.1   Chloride 98 - 111 mmol/L 104  102  103   CO2 22 - 32 mmol/L 25  25  32   Calcium 8.9 - 10.3 mg/dL 9.0  9.2  8.9   Total Protein 6.0 - 8.5 g/dL  6.9  6.4   Total Bilirubin 0.0 - 1.2 mg/dL  0.3  0.8   Alkaline Phos 44 - 121 IU/L  119  114   AST 0 - 40 IU/L  17  15   ALT 0 - 32 IU/L  8  26    Lipid Panel     Component Value Date/Time   CHOL 303 (H) 10/24/2023 1649   TRIG 252 (H) 10/24/2023 1649   HDL 47 10/24/2023 1649   CHOLHDL 6.4 (H) 10/24/2023 1649   LDLCALC 206 (H) 10/24/2023 1649    CBC    Component Value Date/Time   WBC 8.6 12/07/2023 1158   RBC 4.33 12/07/2023 1158   HGB 14.0 12/07/2023 1158   HGB 14.2 10/24/2023 1649   HCT 42.6 12/07/2023 1158   HCT 41.5 10/24/2023 1649   PLT 236 12/07/2023 1158   PLT 226 10/24/2023 1649   MCV 98.4 12/07/2023 1158   MCV 96 10/24/2023 1649   MCH 32.3 12/07/2023 1158   MCHC 32.9 12/07/2023 1158   RDW 12.3 12/07/2023 1158   RDW 11.8 10/24/2023 1649   LYMPHSABS 1.7 04/23/2022 0041   MONOABS 0.7 04/23/2022 0041   EOSABS 0.3 04/23/2022 0041   BASOSABS 0.1 04/23/2022 0041    ASSESSMENT AND PLAN: 1. Episode of dizziness (Primary) This has resolved.  2. Mastoiditis of left side Patient noted to have mastoid effusion on CAT scan 1 month ago.  She is asymptomatic at this time.  I will not pursue any further workup.  3. Morbid obesity with BMI of 45.0-49.9, adult [facility]) Patient has not had any significant weight loss with  increased dose of phentermine.  She also experienced some dizziness with noted frequent PVCs on EKG in the ER when she self to increase the dose to 2 tablets daily.  At this time I recommend that we discontinue the medication since she has not had any significant weight loss.  She is prediabetes but we do not have the  program at this time for [facility] or Ozempic for patients with prediabetes.  Discussed and encouraged healthy eating habits.  Printed information given.  Encouraged her to continue exercising.  4. Prediabetes See #3 above.  5. PVCs (premature ventricular contractions) See #3 above.  Sounds to be in normal sinus rhythm today on auscultation  6. OSA (obstructive sleep apnea) Patient to continue using BiPAP machine with oxygen at night.   Patient was given the opportunity to ask questions.  Patient verbalized understanding of the plan and was able to repeat key elements of the plan.   This documentation was completed using Paediatric nurse.  Any transcriptional errors are unintentional.  No orders of the defined types were placed in this encounter.    Requested Prescriptions    No prescriptions requested or ordered in this encounter    No follow-ups on file.  Concetta Dee, MD, FACP

## 2024-04-07 ENCOUNTER — Ambulatory Visit: Payer: Self-pay | Admitting: Internal Medicine

## 2024-05-27 ENCOUNTER — Telehealth: Payer: Self-pay | Admitting: Internal Medicine

## 2024-05-27 NOTE — Telephone Encounter (Signed)
 Voicemail left by volunteer to confirm patient's appointment for 05/29/2024.

## 2024-05-29 ENCOUNTER — Other Ambulatory Visit: Payer: Self-pay | Admitting: Internal Medicine

## 2024-05-29 ENCOUNTER — Other Ambulatory Visit: Payer: Self-pay

## 2024-05-29 ENCOUNTER — Encounter: Payer: Self-pay | Admitting: Internal Medicine

## 2024-05-29 ENCOUNTER — Ambulatory Visit: Payer: Self-pay | Attending: Internal Medicine | Admitting: Internal Medicine

## 2024-05-29 VITALS — BP 101/46 | HR 39 | Temp 97.8°F | Ht 59.0 in | Wt 249.0 lb

## 2024-05-29 DIAGNOSIS — Z6841 Body Mass Index (BMI) 40.0 and over, adult: Secondary | ICD-10-CM

## 2024-05-29 DIAGNOSIS — Z23 Encounter for immunization: Secondary | ICD-10-CM

## 2024-05-29 DIAGNOSIS — R001 Bradycardia, unspecified: Secondary | ICD-10-CM

## 2024-05-29 DIAGNOSIS — Z9981 Dependence on supplemental oxygen: Secondary | ICD-10-CM

## 2024-05-29 DIAGNOSIS — I5032 Chronic diastolic (congestive) heart failure: Secondary | ICD-10-CM

## 2024-05-29 DIAGNOSIS — J9611 Chronic respiratory failure with hypoxia: Secondary | ICD-10-CM

## 2024-05-29 DIAGNOSIS — E782 Mixed hyperlipidemia: Secondary | ICD-10-CM

## 2024-05-29 DIAGNOSIS — R7303 Prediabetes: Secondary | ICD-10-CM

## 2024-05-29 DIAGNOSIS — H43392 Other vitreous opacities, left eye: Secondary | ICD-10-CM

## 2024-05-29 DIAGNOSIS — G4733 Obstructive sleep apnea (adult) (pediatric): Secondary | ICD-10-CM

## 2024-05-29 DIAGNOSIS — R9431 Abnormal electrocardiogram [ECG] [EKG]: Secondary | ICD-10-CM

## 2024-05-29 LAB — POCT GLYCOSYLATED HEMOGLOBIN (HGB A1C): HbA1c, POC (prediabetic range): 5.8 % (ref 5.7–6.4)

## 2024-05-29 LAB — GLUCOSE, POCT (MANUAL RESULT ENTRY): POC Glucose: 106 mg/dL — AB (ref 70–99)

## 2024-05-29 MED ORDER — METFORMIN HCL 500 MG PO TABS
250.0000 mg | ORAL_TABLET | Freq: Every day | ORAL | 2 refills | Status: AC
  Filled 2024-05-29: qty 45, 90d supply, fill #0
  Filled 2024-09-02: qty 45, 90d supply, fill #1

## 2024-05-29 MED ORDER — ATORVASTATIN CALCIUM 20 MG PO TABS
20.0000 mg | ORAL_TABLET | Freq: Every day | ORAL | 4 refills | Status: DC
  Filled 2024-05-29: qty 90, 90d supply, fill #0
  Filled 2024-09-02: qty 90, 90d supply, fill #1

## 2024-05-29 MED ORDER — FUROSEMIDE 20 MG PO TABS
20.0000 mg | ORAL_TABLET | Freq: Every day | ORAL | 6 refills | Status: AC | PRN
  Filled 2024-05-29: qty 60, 30d supply, fill #0

## 2024-05-29 NOTE — Telephone Encounter (Unsigned)
 Copied from CRM [id number]. Topic: Clinical - Medication Refill >> May 29, 2024  8:04 AM Tiffini S wrote: Medication: atorvastatin  (LIPITOR) 10 MG tablet, furosemide  (LASIX ) 20 MG tablet   Has the patient contacted their pharmacy? No (Agent: If no, request that the patient contact the pharmacy for the refill. If patient does not wish to contact the pharmacy document the reason why and proceed with request.) (Agent: If yes, when and what did the pharmacy advise?)  This is the patient's preferred pharmacy:  [facility] [address], [address] [place] [address] Phone: [phone] Fax: [phone]   Is this the correct pharmacy for this prescription? Yes If no, delete pharmacy and type the correct one.   Has the prescription been filled recently? Yes  Is the patient out of the medication? Yes  Has the patient been seen for an appointment in the last year OR does the patient have an upcoming appointment? Yes  Can we respond through MyChart? Yes  Agent: Please be advised that Rx refills may take up to 3 business days. We ask that you follow-up with your pharmacy.  Spanish Araceli ID: [id number]

## 2024-05-29 NOTE — Patient Instructions (Signed)
 Alimentacin saludable en los [employer], Adult Una alimentacin saludable puede ayudarlo a Barista y Pharmacologist un peso saludable, reducir el riesgo de tener enfermedades crnicas y vivir Neomia Dear vida larga y productiva. Es importante que siga una modalidad de alimentacin saludable. Sus necesidades nutricionales y calricas deben satisfacerse principalmente con distintos alimentos ricos en nutrientes. Consejos para seguir Surveyor, minerals Lea las etiquetas de los alimentos Lea las etiquetas y elija las que digan lo siguiente: Productos reducidos en sodio o con bajo contenido de [place]. Jugos con 100 % jugo de fruta. Alimentos con bajo contenido de grasas saturadas (menos de 3 g por porcin) y alto contenido de grasas poliinsaturadas y Mining engineer. Alimentos con cereales integrales, como trigo integral, trigo partido, arroz integral y arroz salvaje. Cereales integrales fortificados con cido flico. Esto se recomienda a las mujeres embarazadas o que desean quedar embarazadas. Lea las etiquetas y no coma ni beba lo siguiente: Alimentos o bebidas con azcar agregada. Estos incluyen los alimentos que contienen azcar moreno, endulzante a base de maz, jarabe de maz, dextrosa, fructosa, glucosa, jarabe de maz de alta fructosa, miel, azcar invertido, lactosa, jarabe de [place], maltosa, Sunflower, azcar sin refinar, sacarosa, trehalosa y azcar turbinado. Limite el consumo de azcar agregada a menos del 10 % del total de caloras diarias. No consuma ms que las siguientes cantidades de azcar agregada por da: 6 cucharaditas (25 g) para las mujeres. 9 cucharaditas (38 g) para los hombres. Los alimentos que contienen almidones y cereales refinados o procesados. Los productos de cereales refinados, como harina blanca, harina de maz desgerminada, pan blanco y arroz blanco. Al ir de compras Elija refrigerios ricos en nutrientes, como verduras, frutas enteras y frutos secos. Evite los refrigerios con  alto contenido de caloras y International aid/development worker, como las papas fritas, los refrigerios frutales y los caramelos. Use alios y productos para untar a base de aceite con los Publishing rights manager de grasas slidas como la [place], la [place], la crema agria o el queso crema. Limite las salsas, las mezclas y los productos "instantneos" preelaborados como el arroz saborizado, los fideos instantneos y las pastas listas para comer. Pruebe ms fuentes de protena vegetal, como tofu, tempeh, frijoles negros, edamame, lentejas, frutos secos y semillas. Explore planes de alimentacin como la dieta mediterrnea o la dieta vegetariana. Pruebe salsas cardiosaludables hechas con frijoles y grasas saludables, como hummus y [place]. Las verduras van muy bien con ellas. Al cocinar Use aceite para Designer, multimedia de grasas slidas como [place], margarina o [place] de [place]. En lugar de frer, trate de cocinar en el horno, en la plancha o en la parrilla, o hervir los alimentos. Retire la parte grasa de las carnes antes de cocinarlas. Cocine las verduras al vapor en agua o caldo. Planificacin de las comidas  En las comidas, imagine dividir su plato en cuartos: La mitad del plato tiene frutas y verduras. Un cuarto del plato tiene cereales integrales. Un cuarto del plato tiene protena, especialmente carnes [place], aves, huevos, tofu, frijoles o frutos secos. Incluya lcteos descremados en su dieta diaria. Estilo de vida Elija opciones saludables en todos los mbitos, como en el hogar, el [place], la [place], los restaurantes y [place]. Prepare los alimentos de un modo seguro: Lvese las manos despus de manipular carnes crudas. Donde prepare alimentos, mantenga las superficies limpias lavndolas regularmente con agua caliente y [place]. Mantenga las carnes crudas separadas de los alimentos que estn listos para comer como las frutas y las verduras. Cocine los frutos  de mar, carnes, aves y Loss adjuster, chartered la temperatura recomendada. Consiga un termmetro para alimentos. Almacene los alimentos a temperaturas seguras. En general: Mantenga los alimentos fros a una temperatura de 40 F (4,4 C) o inferior. Mantenga los alimentos calientes a una temperatura de 140 F (60 C) o superior. Mantenga el congelador a una temperatura de 0 F (-17,8 C) o inferior. Los alimentos no son seguros para su consumo cuando han estado a una temperatura de entre 40 y 140 F (4.4 y 60 C) por ms de 2 horas. Qu alimentos debo comer? Frutas Propngase comer entre 1 y 2 tazas de frutas frescas, Primary school teacher (en su jugo natural) o Primary school teacher. Una taza de fruta equivale a 1 manzana pequea, 1 banana grande, 8 fresas grandes, 1 taza (237 g) de fruta enlatada,  taza (82 g) de fruta seca o 1 taza (240 ml) de jugo al 100 %. Verduras Propngase comer de 2 a 4 tazas de verduras frescas y Primary school teacher, incluyendo diferentes variedades y colores. Una taza de verduras equivale a 1 taza (91 g) de brcoli o coliflor, 2 zanahorias medianas, 2 tazas (150 g) de verduras de [employer] crudas, 1 tomate grande, 1 pimiento morrn grande, 1 batata grande o 1 patata blanca mediana. Cereales Propngase comer el equivalente a entre 4 y 10 onzas de cereales integrales por Futures trader. Algunos ejemplos de equivalentes a 1 onza de cereales son 1 rebanada de pan, 1 taza (40 g) de cereal listo para comer, 3 tazas (24 g) de palomitas de maz o  taza (93 g) de arroz cocido. Carnes y otras protenas Propngase comer el equivalente a entre 5 y 7  onzas de protena por Futures trader. Algunos ejemplos de equivalentes a 1 onza de protenas incluyen 1 huevo,  oz de frutos secos (12 almendras, 24 pistachos o 7 mitades de nueces), 1/4 taza (90 g) de frijoles cocidos, 6 cucharadas (90 g) de hummus o 1 cucharada (16 g) de [place] de man. Un corte de carne o pescado del tamao de un mazo de cartas equivale aproximadamente a 3 a 4 onzas (85  g). De las protenas que consume cada semana, intente que al menos 8 onzas (227 g) sean frutos de mar. Esto equivale a unas 2 porciones por semana. Esto incluye salmn, trucha, arenque y anchoas. Lcteos [employer] a 3 tazas de lcteos descremados o con bajo contenido de Museum/gallery curator. Algunos ejemplos de equivalentes a 1 taza de lcteos son 1 taza (240 ml) de leche, 8 onzas (250 g) de yogur, 1 onzas (44 g) de queso natural o 1 taza (240 ml) de leche de soja fortificada. Grasas y aceites Propngase consumir alrededor de 5 cucharaditas (21 g) de grasas y Acupuncturist. Elija grasas monoinsaturadas, como el aceite de canola y de [place], la [place] con aceite de [place] o de [place], [place], [place] de man y Games developer de los frutos secos, o bien grasas poliinsaturadas, como el aceite de [place], maz y soja, nueces, piones, semillas de ssamo, semillas de girasol y semillas de lino. Bebidas Propngase beber 6 vasos de 8 onzas de Warehouse manager. Limite el caf a entre 3 y 5 tazas de ocho onzas por Futures trader. Limite el consumo de bebidas con cafena que tengan caloras agregadas, como los refrescos y las bebidas energizantes. Si bebe alcohol: Limite la cantidad que bebe a lo siguiente: De 0 a 1 medida al da si es [place]. De 0 a 2 medidas  al da si es varn. Sepa cunta cantidad de alcohol hay en las bebidas que toma. En los [address], una medida es una botella de cerveza de 12 oz (355 ml), un vaso de vino de 5 oz (148 ml) o un vaso de una bebida alcohlica de alta graduacin de 1 oz (44 ml). Condimentos y otros alimentos Trate de no agregar demasiada sal a los alimentos. Trate de usar hierbas y especias en lugar de sal. Trate de no agregar azcar a los alimentos. Esta informacin se basa en las pautas de nutricin de los [place]. Para obtener ms informacin, visite [email or web address]. Las Information systems manager. Es posible que necesite cantidades  diferentes. Esta informacin no tiene Theme park manager el consejo del mdico. Asegrese de hacerle al mdico cualquier pregunta que tenga. Document Revised: 07/10/2022 Document Reviewed: 07/10/2022 Elsevier Patient Education  2024 [employer].

## 2024-05-29 NOTE — Progress Notes (Signed)
 Patient ID: Veronica Castaneda, female    DOB: 05/21/1961  MRN: [id number]  CC: Chronic Disease Management (Chronic disease management f/u. /L eye shows a dark spot in line of vision X4 mo/Flu vax administered on 05/29/2024 - C.A.)   Subjective: Veronica Castaneda is a 63 y.o. female who presents for chronic ds management. Her concerns today include:  Pt with preDM/morbid obesity, OA LT knee, mixed HL, persistent asthma, hypoventilation syndrome with likely OSA on BiPAP, chronic hypoxia on home O2   [employer] Language interpreter used during this encounter. #Miguel, [id number]   Discussed the use of AI scribe software for clinical note transcription with the patient, who gave verbal consent to proceed.  History of Present Illness [name] is a 64 year old female with prediabetes, hyperlipidemia, sleep apnea, and heart failure who presents for chronic disease management.  Obesity/PreDM: Results for orders placed or performed in visit on 05/29/24  POCT glucose (manual entry)   Collection Time: 05/29/24 10:07 AM  Result Value Ref Range   POC Glucose 106 (A) 70 - 99 mg/dl  POCT glycosylated hemoglobin (Hb A1C)   Collection Time: 05/29/24 10:48 AM  Result Value Ref Range   Hemoglobin A1C     HbA1c POC (<> result, manual entry)     HbA1c, POC (prediabetic range) 5.8 5.7 - 6.4 %   HbA1c, POC (controlled diabetic range)    She has gained approximately 12 pounds since her last visit in April. She engages in walking for about 15 to 20 minutes in the afternoons but not daily. Her dietary habits include reducing tortilla intake from three to one per meal, but she has not significantly reduced rice and potato consumption. She continues to take metformin  for prediabetes and requires a refill.  HL: Regarding her hyperlipidemia, she was previously on atorvastatin  10 mg daily but has not been taking it consistently since running out of the medication. She has not picked up a  refill since February.  OSA/chronic  hypoxia: For her sleep apnea, she uses a BiPAP machine with oxygen for a minimum of four hours per night. She reports feeling less tired during the day when using the machine.  CHF: In terms of heart failure, she experiences swelling in her legs and occasional shortness of breath, describing times when she feels tired and unable to breathe well. She also reports significant back pain but denies chest pain. She uses three pillows at night. She has not been taking furosemide  as she has run out of it. - Noted to be bradycardic today.  However patient denies any symptoms of dizziness or significant shortness of breath.  She reports a new issue with her left eye, describing a spot in the middle of her vision that appears like a floating mosquito. This has been present for about four months. She has not seen an eye doctor for this issue.    Patient Active Problem List   Diagnosis Date Noted   Chronic diastolic heart failure (HCC) 11/20/2022   Chronic right-sided heart failure (HCC) 05/18/2022   Asthma in adult, mild persistent, uncomplicated 05/18/2022   Chronic respiratory failure with hypoxia, on home oxygen therapy (HCC) 05/18/2022   CAP (community acquired pneumonia) 04/20/2022   Abnormal LFTs 04/20/2022   Ventral hernia without obstruction or gangrene    Umbilical hernia without obstruction and without gangrene    Hyperventilation syndrome 11/24/2020   Asthma exacerbation 05/22/2020   Morbid obesity with body mass index (BMI) of 50.0 to 59.9  in adult [facility]) 05/22/2020   Obesity hypoventilation syndrome (HCC) 05/22/2020   OSA (obstructive sleep apnea) 05/22/2020   RSV (respiratory syncytial virus infection) 05/22/2020   Difficult airway for intubation 05/22/2020   Acute respiratory failure with hypoxia (HCC) 05/12/2020   Loud snoring 02/11/2020   Daytime sleepiness 02/11/2020   Screening breast examination 10/22/2019   Prediabetes 08/18/2019    Hyperlipidemia 08/18/2019   Morbid obesity (HCC) 08/17/2019     Current Outpatient Medications on File Prior to Visit  Medication Sig Dispense Refill   albuterol  (VENTOLIN  HFA) 108 (90 Base) MCG/ACT inhaler INHALE 2 PUFFS INTO THE LUNGS EVERY 4 (FOUR) HOURS AS NEEDED FOR WHEEZING OR SHORTNESS OF BREATH. 6.7 g 1   diclofenac  Sodium (VOLTAREN ) 1 % GEL Apply 2 g topically 4 (four) times daily. 100 g 1   fluticasone  furoate-vilanterol (BREO ELLIPTA ) 100-25 MCG/ACT AEPB Inhale 1 puff into the lungs daily. 60 each 6   meclizine  (ANTIVERT ) 25 MG tablet Take 1 tablet (25 mg total) by mouth 3 (three) times daily as needed for dizziness. (Patient not taking: Reported on 05/29/2024) 30 tablet 0   No current facility-administered medications on file prior to visit.    No Known Allergies  Social History   Socioeconomic History   Marital status: Married    Spouse name: Not on file   Number of children: 3   Years of education: Not on file   Highest education level: 2nd grade  Occupational History   Occupation: house wife  Tobacco Use   Smoking status: Never   Smokeless tobacco: Never  Vaping Use   Vaping status: Never Used  Substance and Sexual Activity   Alcohol use: Never   Drug use: Never   Sexual activity: Yes    Birth control/protection: None  Other Topics Concern   Not on file  Social History Narrative   Not on file   Social Drivers of Health   Financial Resource Strain: Medium Risk (01/06/2024)   Overall Financial Resource Strain (CARDIA)    Difficulty of Paying Living Expenses: Somewhat hard  Food Insecurity: No Food Insecurity (01/06/2024)   Hunger Vital Sign    Worried About Running Out of Food in the Last Year: Never true    Ran Out of Food in the Last Year: Never true  Transportation Needs: No Transportation Needs (01/06/2024)   PRAPARE - Administrator, Civil Service (Medical): No    Lack of Transportation (Non-Medical): No  Physical Activity: Insufficiently  Active (01/06/2024)   Exercise Vital Sign    Days of Exercise per Week: 2 days    Minutes of Exercise per Session: 20 min  Stress: No Stress Concern Present (01/06/2024)   [employer] of Occupational Health - Occupational Stress Questionnaire    Feeling of Stress : Not at all  Social Connections: Socially Integrated (01/06/2024)   Social Connection and Isolation Panel    Frequency of Communication with Friends and Family: Twice a week    Frequency of Social Gatherings with Friends and Family: Twice a week    Attends Religious Services: More than 4 times per year    Active Member of [employer] or Organizations: Yes    Attends Banker Meetings: 1 to 4 times per year    Marital Status: Married  Catering manager Violence: Not At Risk (01/06/2024)   Humiliation, Afraid, Rape, and Kick questionnaire    Fear of Current or Ex-Partner: No    Emotionally Abused: No    Physically Abused:  No    Sexually Abused: No    Family History  Problem Relation Age of Onset   Heart attack Mother    Heart disease Mother    Breast cancer Neg Hx     Past Surgical History:  Procedure Laterality Date   INSERTION OF MESH  06/15/2021   Procedure: INSERTION OF MESH;  Surgeon: Desiderio Schanz, MD;  Location: [facility] ORS;  Service: General;;   NO PAST SURGERIES     No PAST SURGICAL HISTORY     XI ROBOTIC ASSISTED VENTRAL HERNIA N/A 06/15/2021   Procedure: XI ROBOTIC ASSISTED VENTRAL HERNIA;  Surgeon: Desiderio Schanz, MD;  Location: [facility] ORS;  Service: General;  Laterality: N/A;    ROS: Review of Systems Negative except as stated above  PHYSICAL EXAM: BP (!) 101/46 (BP Location: Left Arm, Patient Position: Sitting, Cuff Size: Large)   Pulse (!) 39   Temp 97.8 F (36.6 C) (Oral)   Ht 4' 11 (1.499 m)   Wt 249 lb (112.9 kg)   SpO2 95%   BMI 50.29 kg/m   Wt Readings from Last 3 Encounters:  05/29/24 249 lb (112.9 kg)  01/06/24 237 lb (107.5 kg)  12/07/23 238 lb (108 kg)  Repeat manual pulse was  38.  Physical Exam  General appearance - morbidly obese older Hispanic FM Mental status - normal mood, behavior, speech, dress, motor activity, and thought processes Neck - supple, no significant adenopathy Chest - clear to auscultation, no wheezes, rales or rhonchi, symmetric air entry Heart - normal rate, regular rhythm, normal S1, S2, no murmurs, rubs, clicks or gallops Extremities - trace to 1+ BL LE edema  EKG shows sinus rhythm with bigeminy    Latest Ref Rng & Units 12/07/2023   11:58 AM 10/24/2023    4:49 PM 04/23/2022   12:41 AM  CMP  Glucose 70 - 99 mg/dL 858  899  886   BUN 6 - 20 mg/dL 13  12  10    Creatinine 0.44 - 1.00 mg/dL 9.39  9.37  9.31   Sodium 135 - 145 mmol/L 139  139  140   Potassium 3.5 - 5.1 mmol/L 3.6  4.5  4.1   Chloride 98 - 111 mmol/L 104  102  103   CO2 22 - 32 mmol/L 25  25  32   Calcium  8.9 - 10.3 mg/dL 9.0  9.2  8.9   Total Protein 6.0 - 8.5 g/dL  6.9  6.4   Total Bilirubin 0.0 - 1.2 mg/dL  0.3  0.8   Alkaline Phos 44 - 121 IU/L  119  114   AST 0 - 40 IU/L  17  15   ALT 0 - 32 IU/L  8  26    Lipid Panel     Component Value Date/Time   CHOL 303 (H) 10/24/2023 1649   TRIG 252 (H) 10/24/2023 1649   HDL 47 10/24/2023 1649   CHOLHDL 6.4 (H) 10/24/2023 1649   LDLCALC 206 (H) 10/24/2023 1649    CBC    Component Value Date/Time   WBC 8.6 12/07/2023 1158   RBC 4.33 12/07/2023 1158   HGB 14.0 12/07/2023 1158   HGB 14.2 10/24/2023 1649   HCT 42.6 12/07/2023 1158   HCT 41.5 10/24/2023 1649   PLT 236 12/07/2023 1158   PLT 226 10/24/2023 1649   MCV 98.4 12/07/2023 1158   MCV 96 10/24/2023 1649   MCH 32.3 12/07/2023 1158   MCHC 32.9 12/07/2023 1158   RDW  12.3 12/07/2023 1158   RDW 11.8 10/24/2023 1649   LYMPHSABS 1.7 04/23/2022 0041   MONOABS 0.7 04/23/2022 0041   EOSABS 0.3 04/23/2022 0041   BASOSABS 0.1 04/23/2022 0041    ASSESSMENT AND PLAN: 1. Morbid obesity with body mass index (BMI) of 50.0 to 59.9 in adult [facility]) (Primary) 2.  Prediabetes Commended her on the dietary changes she has made so far.  However encouraged her to cut back on white starches.  Should not eat rice or potatoes no more than twice a week.  Continue daily walks. - POCT glucose (manual entry) - POCT glycosylated hemoglobin (Hb A1C) - metFORMIN  (GLUCOPHAGE ) 500 MG tablet; Take 0.5 tablets (250 mg total) by mouth daily with breakfast.  Dispense: 45 tablet; Refill: 2  3. Mixed hyperlipidemia Should be on atorvastatin  but has been out of it for months.  Last cholesterol profile showed elevated total and LDL cholesterols.  Will refill but at a dose of 20 mg daily - atorvastatin  (LIPITOR) 20 MG tablet; Take 1 tablet (20 mg total) by mouth daily.  Dispense: 90 tablet; Refill: 4  4. OSA (obstructive sleep apnea) 5. Chronic respiratory failure with hypoxia, on home oxygen therapy (HCC) Continue using BiPAP and her oxygen at nighttime's.  6. Bradycardia - EKG 12-Lead - TSH+T4F+T3Free - Ambulatory referral to Cardiology - Comprehensive metabolic panel with GFR  7. Chronic diastolic heart failure (HCC) - furosemide  (LASIX ) 20 MG tablet; Take 1-2 tablets (20-40 mg total) by mouth daily as needed for swelling.  Dispense: 60 tablet; Refill: 6 - Ambulatory referral to Cardiology  8. Vitreous floaters of left eye - Ambulatory referral to Ophthalmology  9. Need for influenza vaccination - Flu vaccine trivalent PF, 6mos and older(Flulaval ,Afluria,Fluarix,Fluzone)  10. Abnormal EKG - Ambulatory referral to Cardiology  Patient was given the opportunity to ask questions.  Patient verbalized understanding of the plan and was able to repeat key elements of the plan.   This documentation was completed using Paediatric nurse.  Any transcriptional errors are unintentional.  Orders Placed This Encounter  Procedures   Flu vaccine trivalent PF, 6mos and older(Flulaval ,Afluria,Fluarix,Fluzone)   TSH+T4F+T3Free   Comprehensive metabolic panel  with GFR   Ambulatory referral to Ophthalmology   Ambulatory referral to Cardiology   POCT glucose (manual entry)   POCT glycosylated hemoglobin (Hb A1C)   EKG 12-Lead     Requested Prescriptions   Signed Prescriptions Disp Refills   metFORMIN  (GLUCOPHAGE ) 500 MG tablet 45 tablet 2    Sig: Take 0.5 tablets (250 mg total) by mouth daily with breakfast.   atorvastatin  (LIPITOR) 20 MG tablet 90 tablet 4    Sig: Take 1 tablet (20 mg total) by mouth daily.   furosemide  (LASIX ) 20 MG tablet 60 tablet 6    Sig: Take 1-2 tablets (20-40 mg total) by mouth daily as needed for swelling.    Return in about 4 months (around 09/28/2024).  Barnie Louder, MD, FACP

## 2024-05-30 ENCOUNTER — Ambulatory Visit: Payer: Self-pay | Admitting: Internal Medicine

## 2024-05-30 LAB — COMPREHENSIVE METABOLIC PANEL WITH GFR
ALT: 20 IU/L (ref 0–32)
AST: 21 IU/L (ref 0–40)
Albumin: 4 g/dL (ref 3.8–4.9)
Alkaline Phosphatase: 95 IU/L (ref 44–121)
BUN/Creatinine Ratio: 14 (ref 12–28)
BUN: 10 mg/dL (ref 8–27)
Bilirubin Total: 0.5 mg/dL (ref 0.0–1.2)
CO2: 26 mmol/L (ref 20–29)
Calcium: 9.4 mg/dL (ref 8.7–10.3)
Chloride: 103 mmol/L (ref 96–106)
Creatinine, Ser: 0.74 mg/dL (ref 0.57–1.00)
Globulin, Total: 3.1 g/dL (ref 1.5–4.5)
Glucose: 95 mg/dL (ref 70–99)
Potassium: 4.9 mmol/L (ref 3.5–5.2)
Sodium: 142 mmol/L (ref 134–144)
Total Protein: 7.1 g/dL (ref 6.0–8.5)
eGFR: 93 mL/min/1.73 (ref >59)

## 2024-05-30 LAB — TSH+T4F+T3FREE
Free T4: 1.39 ng/dL (ref 0.82–1.77)
T3, Free: 2.2 pg/mL (ref 2.0–4.4)
TSH: 2.23 u[IU]/mL (ref 0.450–4.500)

## 2024-07-13 ENCOUNTER — Telehealth: Payer: Self-pay | Admitting: Internal Medicine

## 2024-07-13 NOTE — Telephone Encounter (Signed)
 Copied from CRM #[id number]. Topic: Clinical - Medication Question >> Jul 13, 2024  2:37 PM Ivette P wrote:  Reason for CRM: pt filled out orange card application  and would liek to know if she was approved or denied, pt has upcoming appt that would help her if she had the orange card.   Pls follow up with pt before end of day.

## 2024-07-17 NOTE — Telephone Encounter (Signed)
 Please Advise

## 2024-07-17 NOTE — Telephone Encounter (Signed)
 Copied from CRM #[id number]. Topic: General - Other >> Jul 17, 2024 11:20 AM Treva T wrote:  Reason for CRM:  Spanish Interpreter assisted call Dan [phone]  Patient states her orange card recently expired, she had a scheduled appt on 05/29/24, completed paperwork, has not received a response regarding status of re-instating orange card to be able to see doctor.  Called and spoke to front office, Delma, states financial counselor is not in office, and has not been for a while, and unsure of when she will return, advised to send CRM to admin at office, and will route to financial counselor covering for office.   Patient would like a return call to discuss further, can be reached at [phone].

## 2024-09-02 ENCOUNTER — Other Ambulatory Visit: Payer: Self-pay

## 2024-09-28 NOTE — Progress Notes (Signed)
 "  Cardiology Office Note    Date:  09/29/2024  ID:  Veronica, Castaneda [birth year], MRN [id number] PCP:  Vicci Barnie NOVAK, MD  Cardiologist:  Jerel Balding, MD  Electrophysiologist:  None   Chief Complaint: overdue f/u CHF, recent abnormal HR at PCP 05/2024  History of Present Illness: .    Veronica Castaneda is a 64 y.o. female with visit-pertinent history of chronic HFpEF, HLD managed by primary care, pre-DM, morbid obesity, presumed OSA, prior suspected obesity hypoventilation syndrome, neuropathy, asthma seen for follow-up.  She was admitted in 2020 with respiratory failure requiring intubation in setting of flu B, suspected to have OHS/OSA. Echo at that time showed EF 45-50%, felt transiently low due to acute illness as follow-up echo later that month showed EF 55-60%. Most recent echo 04/2022 showed EF 55-60%, G1DD, mildly enlarged RV felt due to incompletely treated sleep apnea with mixed adherence. She was previously evaluated by Dr. Burnard with discussion of adherence to CPAP/BiPAP and was lost to follow-up thereafter. She has required Lasix  for LE edema. 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 K, TSH normal at that time.  She returns for follow-up today with interpreter. She reports she has not had any chest pain or SOB recently. She has had occasional palpitations. She takes 20mg  Lasix  about once a week for breakthrough swelling. She has not had any dizziness or syncope. She reports she uses CPAP nightly now.  Labwork independently reviewed: 05/2024 K 4.9, Cr 0.74, LFTs wnl, TFTs wnl 11/2023 BNP wnl, CBC wnl 09/2023 LDL 206, trig 252 (PCP)  ROS: .    Please see the history of present illness.  All other systems are reviewed and otherwise negative.  Studies Reviewed: SABRA    EKG:  EKG is ordered today, personally reviewed,  demonstrating:  EKG Interpretation Date/Time:  Tuesday September 29 2024 09:13:50 EST Ventricular Rate:  73 PR Interval:  146 QRS Duration:  80 QT Interval:  380 QTC Calculation: 418 R Axis:   44  Text Interpretation: Sinus rhythm with occasional Premature ventricular complexes Low voltage QRS Nonspecific ST and T wave abnormality Confirmed by Veronica Castaneda [phone]) on 09/29/2024 9:23:43 AM    CV Studies: Cardiac studies reviewed are outlined and summarized above. Otherwise please see EMR for full report.   Current Reported Medications:.    Active Medications[1]  Physical Exam:    VS:  BP 122/70   Pulse 76   Ht 4' 11 (1.499 m)   Wt 244 lb (110.7 kg)   SpO2 95%   BMI 49.28 kg/m    Wt Readings from Last 3 Encounters:  09/29/24 244 lb (110.7 kg)  05/29/24 249 lb (112.9 kg)  01/06/24 237 lb (107.5 kg)    GEN: Well nourished, well developed in no acute distress NECK: No JVD; No carotid bruits CARDIAC: RRR with rare ectopy, no murmurs, rubs, gallops RESPIRATORY:  Clear to auscultation without rales, wheezing or rhonchi  ABDOMEN: Soft, non-tender, non-distended EXTREMITIES:  Trace sockline edema only; No acute deformity   Asessement and Plan:.    1. Chronic HFpEF with mild RV enlargement - volume status is challenging to interpret but she denies any recent dyspnea or progressive LE edema. Exam is reassuring. Continue with Lasix  PRN as she is doing (takes about once a week). Follow updated echocardiogram as ordered below.  2. Frequent PVCs, recent question of bradycardia -  at recent OV with PCP 05/2024, HR documented as 39 but EKG concurrently showing NSR with ventricular bigeminy therefore I suspect the HR of 39 was due to PVCs not being adequately captured. She still has occasional ectopy on exam and EKG today. Arrange 7 day Zio for quantification and exclusion of true daytime bradycardia. Update echocardiogram to ensure LVEF stable. Recent K, TSH was normal. She reports she is  seeing PCP this afternoon and anticipates labwork. I recommended she discuss obtaining a magnesium  level with updated labs with her PCP and wrote this on AVS.   3. Morbid obesity with OSA - per Dr. Joesphine notes, never actually had a sleep study, previously sent home with BiPAP machine. Patient thinks hers is a CPAP. We will order split night study and refer to Dr. Shlomo to manage this given Dr. Burnard has retired. STOPBANG 6. I also offered referral to [facility] to discuss obesity treatment, should be considered for GLP1. She wishes to discuss with PCP today.  4. Hyperlipidemia - LDL 206 with triglycerides of 252 in 09/2023. At 05/2024 OV, had been out of her cholesterol medication so primary care added back atorvastatin . Will defer lipid management to PCP. She reports she has OV scheduled there this afternoon. She does not know whether they plan to do labwork but is not fasting today. I advised she discuss timing of recheck with PCP.    Disposition: F/u with me in 6-8 weeks. Refer to Dr. Shlomo following sleep study for OSA management.  Signed, Solei Wubben N Damir Leung, PA-C      [1]  Current Meds  Medication Sig   albuterol  (VENTOLIN  HFA) 108 (90 Base) MCG/ACT inhaler INHALE 2 PUFFS INTO THE LUNGS EVERY 4 (FOUR) HOURS AS NEEDED FOR WHEEZING OR SHORTNESS OF BREATH.   atorvastatin  (LIPITOR) 20 MG tablet Take 1 tablet (20 mg total) by mouth daily.   diclofenac  Sodium (VOLTAREN ) 1 % GEL Apply 2 g topically 4 (four) times daily.   fluticasone  furoate-vilanterol (BREO ELLIPTA ) 100-25 MCG/ACT AEPB Inhale 1 puff into the lungs daily.   furosemide  (LASIX ) 20 MG tablet Take 1-2 tablets (20-40 mg total) by mouth daily as needed for swelling.   meclizine  (ANTIVERT ) 25 MG tablet Take 1 tablet (25 mg total) by mouth 3 (three) times daily as needed for dizziness.   metFORMIN  (GLUCOPHAGE ) 500 MG tablet Take 0.5 tablets (250 mg total) by mouth daily with breakfast.   "

## 2024-09-29 ENCOUNTER — Encounter: Payer: Self-pay | Admitting: Physician Assistant

## 2024-09-29 ENCOUNTER — Ambulatory Visit: Payer: Self-pay | Attending: Physician Assistant | Admitting: Physician Assistant

## 2024-09-29 ENCOUNTER — Ambulatory Visit: Payer: Self-pay

## 2024-09-29 ENCOUNTER — Ambulatory Visit: Payer: Self-pay | Attending: Internal Medicine | Admitting: Internal Medicine

## 2024-09-29 VITALS — BP 111/69 | HR 63 | Temp 97.9°F | Ht 59.0 in | Wt 245.0 lb

## 2024-09-29 VITALS — BP 122/70 | HR 76 | Ht 59.0 in | Wt 244.0 lb

## 2024-09-29 DIAGNOSIS — Z9981 Dependence on supplemental oxygen: Secondary | ICD-10-CM

## 2024-09-29 DIAGNOSIS — Z6841 Body Mass Index (BMI) 40.0 and over, adult: Secondary | ICD-10-CM

## 2024-09-29 DIAGNOSIS — E662 Morbid (severe) obesity with alveolar hypoventilation: Secondary | ICD-10-CM

## 2024-09-29 DIAGNOSIS — I517 Cardiomegaly: Secondary | ICD-10-CM

## 2024-09-29 DIAGNOSIS — E669 Obesity, unspecified: Secondary | ICD-10-CM

## 2024-09-29 DIAGNOSIS — I493 Ventricular premature depolarization: Secondary | ICD-10-CM

## 2024-09-29 DIAGNOSIS — Z1231 Encounter for screening mammogram for malignant neoplasm of breast: Secondary | ICD-10-CM

## 2024-09-29 DIAGNOSIS — I5032 Chronic diastolic (congestive) heart failure: Secondary | ICD-10-CM

## 2024-09-29 DIAGNOSIS — G4733 Obstructive sleep apnea (adult) (pediatric): Secondary | ICD-10-CM

## 2024-09-29 DIAGNOSIS — E782 Mixed hyperlipidemia: Secondary | ICD-10-CM

## 2024-09-29 DIAGNOSIS — E78019 Familial hypercholesterolemia, unspecified: Secondary | ICD-10-CM

## 2024-09-29 DIAGNOSIS — J9611 Chronic respiratory failure with hypoxia: Secondary | ICD-10-CM

## 2024-09-29 NOTE — Progress Notes (Unsigned)
 Applied a 7 day Zio XT monitor to patient in the office  Croitoru to read

## 2024-09-29 NOTE — Progress Notes (Unsigned)
 "   Patient ID: Veronica Castaneda, female    DOB: 1961/05/09  MRN: [id number]  CC: Obesity (Obesity f/u. Requesting meds be delivered./Cardiologist recommended a sleep study - please see cardio AVS/Already recevied flu vax. )   Subjective: Veronica Castaneda is a 64 y.o. female who presents for chronic ds management. Her chronic medical issues include:  Pt with preDM/morbid obesity, OA LT knee, mixed HL, persistent asthma, hypoventilation syndrome with likely OSA on BiPAP, chronic hypoxia on home O2   [employer] Language interpreter used during this encounter. #[id number], Unice   Discussed the use of AI scribe software for clinical note transcription with the patient, who gave verbal consent to proceed.  History of Present Illness Veronica Castaneda is a 64 year old female with probable sleep apnea, congestive heart failure, and prediabetes who presents for a four month follow-up visit.  OHS/OSA: She manages her sleep apnea with a BiPAP machine and oxygen at night, which has resolved her daytime sleepiness and improved her alertness. A sleep study was recommended during her recent cardiology clinic visit as she never formally had one. However, we had tried in past to get sleep study done but pt would not return call to schedule or would no show as I recall.  For congestive heart failure, she takes furosemide  as needed, approximately two to three times a week, particularly when she notices increased leg swelling. She reports that she no longer has shortness of breath when lying down at night or when walking. She is currently wearing a heart monitor for seven days as ordered by the cardiology clinic today to eval frequent PVCs and bradycardia.  Regarding her prediabetes and obesity, she continues to take half a tablet of metformin  daily. She has lost four pounds since her last visit. She maintains physical activity by walking for about fifteen minutes daily, although she has reduced  outdoor walking due to cold weather.  HL: She is on atorvastatin  20 mg daily.  Overdue for repeat lipid profile.    Patient Active Problem List   Diagnosis Date Noted   Chronic diastolic heart failure (HCC) 11/20/2022   Chronic right-sided heart failure (HCC) 05/18/2022   Asthma in adult, mild persistent, uncomplicated 05/18/2022   Chronic respiratory failure with hypoxia, on home oxygen therapy (HCC) 05/18/2022   CAP (community acquired pneumonia) 04/20/2022   Abnormal LFTs 04/20/2022   Ventral hernia without obstruction or gangrene    Umbilical hernia without obstruction and without gangrene    Hyperventilation syndrome 11/24/2020   Asthma exacerbation 05/22/2020   Morbid obesity with body mass index (BMI) of 50.0 to 59.9 in adult (HCC) 05/22/2020   Obesity hypoventilation syndrome (HCC) 05/22/2020   OSA (obstructive sleep apnea) 05/22/2020   RSV (respiratory syncytial virus infection) 05/22/2020   Difficult airway for intubation 05/22/2020   Acute respiratory failure with hypoxia (HCC) 05/12/2020   Loud snoring 02/11/2020   Daytime sleepiness 02/11/2020   Screening breast examination 10/22/2019   Prediabetes 08/18/2019   Hyperlipidemia 08/18/2019   Morbid obesity (HCC) 08/17/2019     Medications Ordered Prior to Encounter[1]  Allergies[2]  Social History   Socioeconomic History   Marital status: Married    Spouse name: Not on file   Number of children: 3   Years of education: Not on file   Highest education level: 2nd grade  Occupational History   Occupation: house wife  Tobacco Use   Smoking status: Never   Smokeless tobacco: Never  Vaping Use   Vaping status:  Never Used  Substance and Sexual Activity   Alcohol use: Never   Drug use: Never   Sexual activity: Yes    Birth control/protection: None  Other Topics Concern   Not on file  Social History Narrative   Not on file   Social Drivers of Health   Tobacco Use: Low Risk (09/29/2024)   Patient History     Smoking Tobacco Use: Never    Smokeless Tobacco Use: Never    Passive Exposure: Not on file  Financial Resource Strain: Medium Risk (01/06/2024)   Overall Financial Resource Strain (CARDIA)    Difficulty of Paying Living Expenses: Somewhat hard  Food Insecurity: No Food Insecurity (01/06/2024)   Hunger Vital Sign    Worried About Running Out of Food in the Last Year: Never true    Ran Out of Food in the Last Year: Never true  Transportation Needs: No Transportation Needs (01/06/2024)   PRAPARE - Administrator, Civil Service (Medical): No    Lack of Transportation (Non-Medical): No  Physical Activity: Insufficiently Active (01/06/2024)   Exercise Vital Sign    Days of Exercise per Week: 2 days    Minutes of Exercise per Session: 20 min  Stress: No Stress Concern Present (01/06/2024)   [employer] of Occupational Health - Occupational Stress Questionnaire    Feeling of Stress : Not at all  Social Connections: Socially Integrated (01/06/2024)   Social Connection and Isolation Panel    Frequency of Communication with Friends and Family: Twice a week    Frequency of Social Gatherings with Friends and Family: Twice a week    Attends Religious Services: More than 4 times per year    Active Member of [employer] or Organizations: Yes    Attends Banker Meetings: 1 to 4 times per year    Marital Status: Married  Catering Manager Violence: Not At Risk (01/06/2024)   Humiliation, Afraid, Rape, and Kick questionnaire    Fear of Current or Ex-Partner: No    Emotionally Abused: No    Physically Abused: No    Sexually Abused: No  Depression (PHQ2-9): Low Risk (05/29/2024)   Depression (PHQ2-9)    PHQ-2 Score: 0  Alcohol Screen: Low Risk (01/06/2024)   Alcohol Screen    Last Alcohol Screening Score (AUDIT): 1  Housing: Low Risk (01/06/2024)   Housing Stability Vital Sign    Unable to Pay for Housing in the Last Year: No    Number of Times Moved in the Last Year: 0     Homeless in the Last Year: No  Utilities: Not At Risk (07/23/2023)   AHC Utilities    Threatened with loss of utilities: No  Health Literacy: Adequate Health Literacy (01/06/2024)   B1300 Health Literacy    Frequency of need for help with medical instructions: Never    Family History  Problem Relation Age of Onset   Heart attack Mother    Heart disease Mother    Breast cancer Neg Hx     Past Surgical History:  Procedure Laterality Date   INSERTION OF MESH  06/15/2021   Procedure: INSERTION OF MESH;  Surgeon: Desiderio Schanz, MD;  Location: [facility] ORS;  Service: General;;   NO PAST SURGERIES     No PAST SURGICAL HISTORY     XI ROBOTIC ASSISTED VENTRAL HERNIA N/A 06/15/2021   Procedure: XI ROBOTIC ASSISTED VENTRAL HERNIA;  Surgeon: Desiderio Schanz, MD;  Location: [facility] ORS;  Service: General;  Laterality: N/A;  ROS: Review of Systems Negative except as stated above  PHYSICAL EXAM: BP 111/69 (BP Location: Left Arm, Patient Position: Sitting, Cuff Size: Normal)   Pulse 63   Temp 97.9 F (36.6 C) (Oral)   Ht 4' 11 (1.499 m)   Wt 245 lb (111.1 kg)   SpO2 94%   BMI 49.48 kg/m   Wt Readings from Last 3 Encounters:  09/29/24 245 lb (111.1 kg)  09/29/24 244 lb (110.7 kg)  05/29/24 249 lb (112.9 kg)    Physical Exam  General appearance - alert, well appearing, and in no distress Mental status - normal mood, behavior, speech, dress, motor activity, and thought processes Chest - clear to auscultation, no wheezes, rales or rhonchi, symmetric air entry Heart - normal rate, regular rhythm, normal S1, S2, no murmurs, rubs, clicks or gallops Extremities - trace to LE edema      Latest Ref Rng & Units 05/29/2024   11:39 AM 12/07/2023   11:58 AM 10/24/2023    4:49 PM  CMP  Glucose 70 - 99 mg/dL 95  858  899   BUN 8 - 27 mg/dL 10  13  12    Creatinine 0.57 - 1.00 mg/dL 9.25  9.39  9.37   Sodium 134 - 144 mmol/L 142  139  139   Potassium 3.5 - 5.2 mmol/L 4.9  3.6  4.5   Chloride 96 -  106 mmol/L 103  104  102   CO2 20 - 29 mmol/L 26  25  25    Calcium  8.7 - 10.3 mg/dL 9.4  9.0  9.2   Total Protein 6.0 - 8.5 g/dL 7.1   6.9   Total Bilirubin 0.0 - 1.2 mg/dL 0.5   0.3   Alkaline Phos 44 - 121 IU/L 95   119   AST 0 - 40 IU/L 21   17   ALT 0 - 32 IU/L 20   8    Lipid Panel     Component Value Date/Time   CHOL 303 (H) 10/24/2023 1649   TRIG 252 (H) 10/24/2023 1649   HDL 47 10/24/2023 1649   CHOLHDL 6.4 (H) 10/24/2023 1649   LDLCALC 206 (H) 10/24/2023 1649    CBC    Component Value Date/Time   WBC 8.6 12/07/2023 1158   RBC 4.33 12/07/2023 1158   HGB 14.0 12/07/2023 1158   HGB 14.2 10/24/2023 1649   HCT 42.6 12/07/2023 1158   HCT 41.5 10/24/2023 1649   PLT 236 12/07/2023 1158   PLT 226 10/24/2023 1649   MCV 98.4 12/07/2023 1158   MCV 96 10/24/2023 1649   MCH 32.3 12/07/2023 1158   MCHC 32.9 12/07/2023 1158   RDW 12.3 12/07/2023 1158   RDW 11.8 10/24/2023 1649   LYMPHSABS 1.7 04/23/2022 0041   MONOABS 0.7 04/23/2022 0041   EOSABS 0.3 04/23/2022 0041   BASOSABS 0.1 04/23/2022 0041    ASSESSMENT AND PLAN: 1. Chronic diastolic heart failure (HCC) (Primary) Intermittent leg swelling managed with furosemide . Limit salt intake - Continue furosemide  as needed for leg swelling. - CBC; Future  2. OSA (obstructive sleep apnea) Doing well on BiPAP Will try one more time to try get her in for formal sleep study. Pt is agreeable to this - Nocturnal polysomnography (NPSG)  3. Chronic respiratory failure with hypoxia, on home oxygen therapy (HCC) - Nocturnal polysomnography (NPSG)  4. Obesity hypoventilation syndrome (HCC) Continue noctural O2 with BiPAP - Nocturnal polysomnography (NPSG)  5. Morbid obesity with BMI of 45.0-49.9, adult (  HCC) Encouraged her to continue to move as much as she can. Would be ideal for GLP-1 agonist agent but patient is uninsured and there is no patient assistance program available for this. Encourage healthy eating habits  6.  Mixed hyperlipidemia Due for repeat lipid study - Lipid panel; Future  7. Encounter for screening mammogram for malignant neoplasm of breast MMG scholarship given and MMG ordered    Patient was given the opportunity to ask questions.  Patient verbalized understanding of the plan and was able to repeat key elements of the plan.   This documentation was completed using Paediatric nurse.  Any transcriptional errors are unintentional.  Orders Placed This Encounter  Procedures   MM 3D SCREENING MAMMOGRAM BILATERAL BREAST   Lipid panel   CBC   Nocturnal polysomnography (NPSG)     Requested Prescriptions    No prescriptions requested or ordered in this encounter    Return in about 4 months (around 01/27/2025) for give lab appt in 1 wk in a.m.  Barnie Louder, MD, FACP     [1]  Current Outpatient Medications on File Prior to Visit  Medication Sig Dispense Refill   albuterol  (VENTOLIN  HFA) 108 (90 Base) MCG/ACT inhaler INHALE 2 PUFFS INTO THE LUNGS EVERY 4 (FOUR) HOURS AS NEEDED FOR WHEEZING OR SHORTNESS OF BREATH. 6.7 g 1   atorvastatin  (LIPITOR) 20 MG tablet Take 1 tablet (20 mg total) by mouth daily. 90 tablet 4   diclofenac  Sodium (VOLTAREN ) 1 % GEL Apply 2 g topically 4 (four) times daily. 100 g 1   fluticasone  furoate-vilanterol (BREO ELLIPTA ) 100-25 MCG/ACT AEPB Inhale 1 puff into the lungs daily. 60 each 6   furosemide  (LASIX ) 20 MG tablet Take 1-2 tablets (20-40 mg total) by mouth daily as needed for swelling. 60 tablet 6   metFORMIN  (GLUCOPHAGE ) 500 MG tablet Take 0.5 tablets (250 mg total) by mouth daily with breakfast. 45 tablet 2   No current facility-administered medications on file prior to visit.  [2] No Known Allergies  "

## 2024-09-29 NOTE — Patient Instructions (Signed)
" °  VISIT SUMMARY: Today, you had a follow-up visit to review your ongoing health conditions, including sleep apnea, congestive heart failure, and prediabetes. We discussed your current treatments and made some adjustments to your care plan.  YOUR PLAN: -CHRONIC DIASTOLIC HEART FAILURE: Chronic diastolic heart failure means your heart has difficulty relaxing and filling with blood. You should continue taking furosemide  as needed for leg swelling.  -OBSTRUCTIVE SLEEP APNEA: Obstructive sleep apnea is a condition where your breathing stops and starts during sleep. You should continue using your BiPAP machine and oxygen at night. A formal sleep study has been ordered, and it is important to attend this appointment.  -MORBID OBESITY: Morbid obesity means having a body mass index (BMI) of 40 or higher. You have lost four pounds and are encouraged to continue your daily walking routine. Keep taking half a tablet of metformin  daily for prediabetes.  -MIXED HYPERLIPIDEMIA: Mixed hyperlipidemia is a condition with high levels of different types of fats in your blood. A cholesterol panel has been ordered, and you should attend your scheduled lab appointment to check your cholesterol levels.  -GENERAL HEALTH MAINTENANCE: You are due for a mammogram. A mammogram scholarship form has been provided to you.  INSTRUCTIONS: Please make sure to attend your scheduled sleep study and lab appointments. Continue your current medications and daily walking routine. Follow up with the cardiology clinic as advised.                      Contains text generated by Abridge.                                 Contains text generated by Abridge.   "

## 2024-09-29 NOTE — Patient Instructions (Addendum)
 Medication Instructions:  Your physician recommends that you continue on your current medications as directed. Please refer to the Current Medication list given to you today.  *If you need a refill on your cardiac medications before your next appointment, please call your pharmacy*  Lab Work: None ordered If you have labs (blood work) drawn today and your tests are completely normal, you will receive your results only by: MyChart Message (if you have MyChart) OR A paper copy in the mail If you have any lab test that is abnormal or we need to change your treatment, we will call you to review the results.  Testing/Procedures: Your physician has recommended that you have a sleep study. This test records several body functions during sleep, including: brain activity, eye movement, oxygen and carbon dioxide blood levels, heart rate and rhythm, breathing rate and rhythm, the flow of air through your mouth and nose, snoring, body muscle movements, and chest and belly movement.    ZIO XT- Long Term Monitor Instructions  Your physician has requested you wear a ZIO patch monitor for 7 days.  This is a single patch monitor. [employer] supplies one patch monitor per enrollment. Additional stickers are not available. Please do not apply patch if you will be having a Nuclear Stress Test,  Echocardiogram, Cardiac CT, MRI, or Chest Xray during the period you would be wearing the  monitor. The patch cannot be worn during these tests. You cannot remove and re-apply the  ZIO XT patch monitor.  Your ZIO patch monitor will be mailed 3 day USPS to your address on file. It may take 3-5 days  to receive your monitor after you have been enrolled.  Once you have received your monitor, please review the enclosed instructions. Your monitor  has already been registered assigning a specific monitor serial # to you.  Billing and Patient Assistance Program Information  We have supplied [employer] with any of your insurance  information on file for billing purposes. [employer] offers a sliding scale Patient Assistance Program for patients that do not have  insurance, or whose insurance does not completely cover the cost of the ZIO monitor.  You must apply for the Patient Assistance Program to qualify for this discounted rate.  To apply, please call [employer] at [phone], select option 4, select option 2, ask to apply for  Patient Assistance Program. Meredeth will ask your household income, and how many people  are in your household. They will quote your out-of-pocket cost based on that information.  [employer] will also be able to set up a 64-month, interest-free payment plan if needed.  When you are ready to remove the patch, follow instructions on the last 2 pages of Patient  Logbook. Stick patch monitor onto the last page of Patient Logbook.  Place Patient Logbook in the blue and white box. Use locking tab on box and tape box closed  securely. The blue and white box has prepaid postage on it. Please place it in the mailbox as  soon as possible. Your physician should have your test results approximately 7 days after the  monitor has been mailed back to [facility].  Call [facility] Customer Care at [phone] if you have questions regarding  your ZIO XT patch monitor. Call them immediately if you see an orange light blinking on your  monitor.  If your monitor falls off in less than 4 days, contact our Monitor department at [phone].  If your monitor becomes loose or falls off after 4 days  call [employer] at [phone] for  suggestions on securing your monitor     Your physician has requested that you have an echocardiogram. Echocardiography is a painless test that uses sound waves to create images of your heart. It provides your doctor with information about the size and shape of your heart and how well your hearts chambers and valves are working. This procedure takes approximately one hour. There  are no restrictions for this procedure. Please do NOT wear cologne, perfume, aftershave, or lotions (deodorant is allowed). Please arrive 15 minutes prior to your appointment time.  Please note: We ask at that you not bring children with you during ultrasound (echo/ vascular) testing. Due to room size and safety concerns, children are not allowed in the ultrasound rooms during exams. Our front office staff cannot provide observation of children in our lobby area while testing is being conducted. An adult accompanying a patient to their appointment will only be allowed in the ultrasound room at the discretion of the ultrasound technician under special circumstances. We apologize for any inconvenience.   Follow-Up: At [facility], you and your health needs are our priority.  As part of our continuing mission to provide you with exceptional heart care, our providers are all part of one team.  This team includes your primary Cardiologist (physician) and Advanced Practice Providers or APPs (Physician Assistants and Nurse Practitioners) who all work together to provide you with the care you need, when you need it.  Your next appointment:   6-8 week(s)  Provider:   Raphael Bring, PA-C          We recommend signing up for the patient portal called MyChart.  Sign up information is provided on this After Visit Summary.  MyChart is used to connect with patients for Virtual Visits (Telemedicine).  Patients are able to view lab/test results, encounter notes, upcoming appointments, etc.  Non-urgent messages can be sent to your provider as well.   To learn more about what you can do with MyChart, go to [email or web address].   Other Instructions Please discuss with your primary care regarding getting a Mg2 lab and weight management. GLP-1 vs referral to [facility].

## 2024-09-30 ENCOUNTER — Encounter: Payer: Self-pay | Admitting: Internal Medicine

## 2024-10-06 ENCOUNTER — Other Ambulatory Visit: Payer: Self-pay | Admitting: Internal Medicine

## 2024-10-06 DIAGNOSIS — Z1231 Encounter for screening mammogram for malignant neoplasm of breast: Secondary | ICD-10-CM

## 2024-10-07 ENCOUNTER — Ambulatory Visit: Payer: Self-pay | Attending: Internal Medicine

## 2024-10-07 DIAGNOSIS — I5032 Chronic diastolic (congestive) heart failure: Secondary | ICD-10-CM

## 2024-10-07 DIAGNOSIS — E782 Mixed hyperlipidemia: Secondary | ICD-10-CM

## 2024-10-08 ENCOUNTER — Ambulatory Visit: Payer: Self-pay | Admitting: Internal Medicine

## 2024-10-08 LAB — CBC
Hematocrit: 42.4 % (ref 34.0–46.6)
Hemoglobin: 14.1 g/dL (ref 11.1–15.9)
MCH: 32.9 pg (ref 26.6–33.0)
MCHC: 33.3 g/dL (ref 31.5–35.7)
MCV: 99 fL — ABNORMAL HIGH (ref 79–97)
Platelets: 236 x10E3/uL (ref 150–450)
RBC: 4.28 x10E6/uL (ref 3.77–5.28)
RDW: 12.7 % (ref 11.7–15.4)
WBC: 5.8 x10E3/uL (ref 3.4–10.8)

## 2024-10-08 LAB — LIPID PANEL
Chol/HDL Ratio: 5.9 ratio — ABNORMAL HIGH (ref 0.0–4.4)
Cholesterol, Total: 277 mg/dL — ABNORMAL HIGH (ref 100–199)
HDL: 47 mg/dL
LDL Chol Calc (NIH): 189 mg/dL — ABNORMAL HIGH (ref 0–99)
Triglycerides: 216 mg/dL — ABNORMAL HIGH (ref 0–149)
VLDL Cholesterol Cal: 41 mg/dL — ABNORMAL HIGH (ref 5–40)

## 2024-10-10 ENCOUNTER — Other Ambulatory Visit: Payer: Self-pay | Admitting: Internal Medicine

## 2024-10-10 DIAGNOSIS — E782 Mixed hyperlipidemia: Secondary | ICD-10-CM

## 2024-10-10 MED ORDER — ATORVASTATIN CALCIUM 40 MG PO TABS
40.0000 mg | ORAL_TABLET | Freq: Every day | ORAL | 1 refills | Status: AC
  Filled 2024-10-10: qty 90, 90d supply, fill #0

## 2024-10-11 DIAGNOSIS — I493 Ventricular premature depolarization: Secondary | ICD-10-CM

## 2024-10-12 ENCOUNTER — Other Ambulatory Visit: Payer: Self-pay

## 2024-10-12 ENCOUNTER — Ambulatory Visit: Payer: Self-pay | Admitting: Physician Assistant

## 2024-10-12 DIAGNOSIS — I5032 Chronic diastolic (congestive) heart failure: Secondary | ICD-10-CM

## 2024-10-12 DIAGNOSIS — I493 Ventricular premature depolarization: Secondary | ICD-10-CM

## 2024-10-14 ENCOUNTER — Other Ambulatory Visit: Payer: Self-pay | Admitting: Emergency Medicine

## 2024-10-14 ENCOUNTER — Telehealth: Payer: Self-pay | Admitting: Emergency Medicine

## 2024-10-14 DIAGNOSIS — I493 Ventricular premature depolarization: Secondary | ICD-10-CM

## 2024-10-14 DIAGNOSIS — I5032 Chronic diastolic (congestive) heart failure: Secondary | ICD-10-CM

## 2024-10-14 NOTE — Telephone Encounter (Signed)
 Spoke with [facility] via Triage Call. Orders released for Magnesium  lab draw.

## 2024-10-15 LAB — MAGNESIUM: Magnesium: 1.9 mg/dL (ref 1.6–2.3)

## 2024-10-16 ENCOUNTER — Ambulatory Visit: Payer: Self-pay | Admitting: Physician Assistant

## 2024-10-22 ENCOUNTER — Other Ambulatory Visit: Payer: Self-pay

## 2024-10-29 ENCOUNTER — Ambulatory Visit (HOSPITAL_COMMUNITY)
Admission: RE | Admit: 2024-10-29 | Discharge: 2024-10-29 | Disposition: A | Payer: Self-pay | Source: Ambulatory Visit | Attending: Physician Assistant | Admitting: Physician Assistant

## 2024-10-29 DIAGNOSIS — R0600 Dyspnea, unspecified: Secondary | ICD-10-CM

## 2024-10-29 DIAGNOSIS — I493 Ventricular premature depolarization: Secondary | ICD-10-CM

## 2024-10-29 LAB — ECHOCARDIOGRAM COMPLETE
Area-P 1/2: 2.99 cm2
S' Lateral: 3.8 cm

## 2024-10-30 MED ORDER — METOPROLOL SUCCINATE ER 25 MG PO TB24: 12.5000 mg | ORAL_TABLET | Freq: Every day | ORAL | 3 refills | Status: AC

## 2024-10-30 NOTE — Addendum Note (Signed)
 Addended by: DORINA BASE on: 10/30/2024 10:59 AM   Modules accepted: Orders

## 2025-01-11 ENCOUNTER — Ambulatory Visit: Payer: Self-pay | Admitting: Physician Assistant

## 2025-02-01 ENCOUNTER — Ambulatory Visit: Payer: Self-pay | Admitting: Internal Medicine
